# Patient Record
Sex: Female | Born: 1935
Health system: Southern US, Community
[De-identification: ages and names within clinical notes are randomized; demographics above are authoritative.]

## PROBLEM LIST (undated history)

## (undated) DIAGNOSIS — C801 Malignant (primary) neoplasm, unspecified: Secondary | ICD-10-CM

## (undated) DIAGNOSIS — E079 Disorder of thyroid, unspecified: Secondary | ICD-10-CM

## (undated) DIAGNOSIS — L309 Dermatitis, unspecified: Secondary | ICD-10-CM

## (undated) DIAGNOSIS — O009 Unspecified ectopic pregnancy without intrauterine pregnancy: Secondary | ICD-10-CM

## (undated) DIAGNOSIS — M1990 Unspecified osteoarthritis, unspecified site: Secondary | ICD-10-CM

## (undated) DIAGNOSIS — I700 Atherosclerosis of aorta: Secondary | ICD-10-CM

## (undated) DIAGNOSIS — E785 Hyperlipidemia, unspecified: Secondary | ICD-10-CM

## (undated) DIAGNOSIS — I4819 Other persistent atrial fibrillation: Secondary | ICD-10-CM

## (undated) DIAGNOSIS — R51 Headache: Secondary | ICD-10-CM

## (undated) DIAGNOSIS — I10 Essential (primary) hypertension: Secondary | ICD-10-CM

## (undated) DIAGNOSIS — E039 Hypothyroidism, unspecified: Secondary | ICD-10-CM

## (undated) DIAGNOSIS — Z87442 Personal history of urinary calculi: Secondary | ICD-10-CM

## (undated) HISTORY — DX: Other persistent atrial fibrillation: I48.19

## (undated) HISTORY — PX: JOINT REPLACEMENT: SHX530

## (undated) HISTORY — PX: WISDOM TOOTH EXTRACTION: SHX21

## (undated) HISTORY — PX: OTHER SURGICAL HISTORY: SHX169

## (undated) HISTORY — PX: ECTOPIC PREGNANCY SURGERY: SHX613

## (undated) HISTORY — PX: HERNIA REPAIR: SHX51

## (undated) HISTORY — PX: TONSILLECTOMY: SUR1361

## (undated) HISTORY — PX: LAPAROTOMY: SHX154

## (undated) HISTORY — DX: Unspecified ectopic pregnancy without intrauterine pregnancy: O00.90

## (undated) HISTORY — DX: Atherosclerosis of aorta: I70.0

---

## 1998-07-22 ENCOUNTER — Other Ambulatory Visit: Admission: RE | Admit: 1998-07-22 | Discharge: 1998-07-22 | Payer: Self-pay

## 2001-02-21 ENCOUNTER — Other Ambulatory Visit: Admission: RE | Admit: 2001-02-21 | Discharge: 2001-02-21 | Payer: Self-pay | Admitting: Family Medicine

## 2005-02-04 ENCOUNTER — Encounter: Admission: RE | Admit: 2005-02-04 | Discharge: 2005-02-04 | Payer: Self-pay | Admitting: Neurosurgery

## 2005-02-18 ENCOUNTER — Encounter: Admission: RE | Admit: 2005-02-18 | Discharge: 2005-02-18 | Payer: Self-pay | Admitting: Neurosurgery

## 2005-03-15 ENCOUNTER — Encounter: Admission: RE | Admit: 2005-03-15 | Discharge: 2005-03-15 | Payer: Self-pay | Admitting: Neurosurgery

## 2005-12-14 ENCOUNTER — Encounter: Admission: RE | Admit: 2005-12-14 | Discharge: 2005-12-14 | Payer: Self-pay | Admitting: Neurosurgery

## 2005-12-23 ENCOUNTER — Inpatient Hospital Stay (HOSPITAL_COMMUNITY): Admission: RE | Admit: 2005-12-23 | Discharge: 2005-12-24 | Payer: Self-pay | Admitting: Neurosurgery

## 2006-01-17 ENCOUNTER — Encounter: Admission: RE | Admit: 2006-01-17 | Discharge: 2006-02-07 | Payer: Self-pay | Admitting: Neurosurgery

## 2006-05-04 ENCOUNTER — Other Ambulatory Visit: Admission: RE | Admit: 2006-05-04 | Discharge: 2006-05-04 | Payer: Self-pay | Admitting: Family Medicine

## 2007-03-02 ENCOUNTER — Inpatient Hospital Stay (HOSPITAL_COMMUNITY): Admission: RE | Admit: 2007-03-02 | Discharge: 2007-03-06 | Payer: Self-pay | Admitting: Orthopedic Surgery

## 2007-03-03 ENCOUNTER — Ambulatory Visit: Payer: Self-pay | Admitting: Physical Medicine & Rehabilitation

## 2008-12-04 LAB — HM DEXA SCAN

## 2009-02-27 LAB — HM COLONOSCOPY: HM Colonoscopy: NORMAL

## 2010-01-26 LAB — HM MAMMOGRAPHY: HM Mammogram: NORMAL

## 2011-03-18 ENCOUNTER — Encounter: Payer: Self-pay | Admitting: Nurse Practitioner

## 2011-03-18 DIAGNOSIS — E039 Hypothyroidism, unspecified: Secondary | ICD-10-CM

## 2011-03-18 DIAGNOSIS — E1159 Type 2 diabetes mellitus with other circulatory complications: Secondary | ICD-10-CM | POA: Insufficient documentation

## 2011-03-18 DIAGNOSIS — M1990 Unspecified osteoarthritis, unspecified site: Secondary | ICD-10-CM

## 2011-03-18 DIAGNOSIS — E785 Hyperlipidemia, unspecified: Secondary | ICD-10-CM

## 2011-03-18 DIAGNOSIS — I152 Hypertension secondary to endocrine disorders: Secondary | ICD-10-CM | POA: Insufficient documentation

## 2011-03-18 DIAGNOSIS — E1169 Type 2 diabetes mellitus with other specified complication: Secondary | ICD-10-CM | POA: Insufficient documentation

## 2011-03-18 DIAGNOSIS — M8580 Other specified disorders of bone density and structure, unspecified site: Secondary | ICD-10-CM

## 2011-03-18 DIAGNOSIS — I10 Essential (primary) hypertension: Secondary | ICD-10-CM

## 2011-03-18 DIAGNOSIS — E1140 Type 2 diabetes mellitus with diabetic neuropathy, unspecified: Secondary | ICD-10-CM

## 2011-11-30 DIAGNOSIS — I700 Atherosclerosis of aorta: Secondary | ICD-10-CM | POA: Insufficient documentation

## 2011-11-30 HISTORY — DX: Atherosclerosis of aorta: I70.0

## 2011-12-13 DIAGNOSIS — E785 Hyperlipidemia, unspecified: Secondary | ICD-10-CM | POA: Diagnosis not present

## 2011-12-13 DIAGNOSIS — I10 Essential (primary) hypertension: Secondary | ICD-10-CM | POA: Diagnosis not present

## 2011-12-13 DIAGNOSIS — E119 Type 2 diabetes mellitus without complications: Secondary | ICD-10-CM | POA: Diagnosis not present

## 2011-12-13 DIAGNOSIS — E559 Vitamin D deficiency, unspecified: Secondary | ICD-10-CM | POA: Diagnosis not present

## 2011-12-13 DIAGNOSIS — E039 Hypothyroidism, unspecified: Secondary | ICD-10-CM | POA: Diagnosis not present

## 2012-02-07 ENCOUNTER — Emergency Department (HOSPITAL_COMMUNITY)
Admission: EM | Admit: 2012-02-07 | Discharge: 2012-02-07 | Disposition: A | Discharge status: Home / Self Care | Payer: Medicare Other | Attending: Emergency Medicine | Admitting: Emergency Medicine

## 2012-02-07 ENCOUNTER — Encounter (HOSPITAL_COMMUNITY): Payer: Self-pay

## 2012-02-07 ENCOUNTER — Other Ambulatory Visit: Payer: Self-pay

## 2012-02-07 ENCOUNTER — Emergency Department (HOSPITAL_COMMUNITY): Payer: Medicare Other

## 2012-02-07 DIAGNOSIS — N269 Renal sclerosis, unspecified: Secondary | ICD-10-CM | POA: Diagnosis not present

## 2012-02-07 DIAGNOSIS — I10 Essential (primary) hypertension: Secondary | ICD-10-CM | POA: Insufficient documentation

## 2012-02-07 DIAGNOSIS — R109 Unspecified abdominal pain: Secondary | ICD-10-CM | POA: Diagnosis not present

## 2012-02-07 DIAGNOSIS — R112 Nausea with vomiting, unspecified: Secondary | ICD-10-CM | POA: Diagnosis not present

## 2012-02-07 DIAGNOSIS — E119 Type 2 diabetes mellitus without complications: Secondary | ICD-10-CM | POA: Diagnosis not present

## 2012-02-07 DIAGNOSIS — N200 Calculus of kidney: Secondary | ICD-10-CM | POA: Diagnosis not present

## 2012-02-07 DIAGNOSIS — R5381 Other malaise: Secondary | ICD-10-CM | POA: Diagnosis not present

## 2012-02-07 DIAGNOSIS — R10816 Epigastric abdominal tenderness: Secondary | ICD-10-CM | POA: Diagnosis not present

## 2012-02-07 DIAGNOSIS — R1013 Epigastric pain: Secondary | ICD-10-CM | POA: Diagnosis not present

## 2012-02-07 DIAGNOSIS — R142 Eructation: Secondary | ICD-10-CM | POA: Insufficient documentation

## 2012-02-07 DIAGNOSIS — R143 Flatulence: Secondary | ICD-10-CM | POA: Insufficient documentation

## 2012-02-07 DIAGNOSIS — K429 Umbilical hernia without obstruction or gangrene: Secondary | ICD-10-CM | POA: Diagnosis not present

## 2012-02-07 DIAGNOSIS — R110 Nausea: Secondary | ICD-10-CM | POA: Diagnosis not present

## 2012-02-07 DIAGNOSIS — Z79899 Other long term (current) drug therapy: Secondary | ICD-10-CM | POA: Diagnosis not present

## 2012-02-07 DIAGNOSIS — R141 Gas pain: Secondary | ICD-10-CM | POA: Diagnosis not present

## 2012-02-07 DIAGNOSIS — R6889 Other general symptoms and signs: Secondary | ICD-10-CM | POA: Diagnosis not present

## 2012-02-07 DIAGNOSIS — R1031 Right lower quadrant pain: Secondary | ICD-10-CM | POA: Diagnosis not present

## 2012-02-07 HISTORY — DX: Essential (primary) hypertension: I10

## 2012-02-07 HISTORY — DX: Disorder of thyroid, unspecified: E07.9

## 2012-02-07 LAB — TROPONIN I
Troponin I: <0.3 ng/mL (ref <0.30)
Troponin I: <0.3 ng/mL (ref <0.30)

## 2012-02-07 LAB — URINALYSIS, ROUTINE W REFLEX MICROSCOPIC
Leukocytes, UA: NEGATIVE
Nitrite: NEGATIVE
Specific Gravity, Urine: 1.01 (ref 1.005–1.030)
Urobilinogen, UA: 0.2 mg/dL (ref 0.0–1.0)
pH: 8.5 — ABNORMAL HIGH (ref 5.0–8.0)

## 2012-02-07 LAB — LACTIC ACID, PLASMA: Lactic Acid, Venous: 2.4 mmol/L — ABNORMAL HIGH (ref 0.5–2.2)

## 2012-02-07 LAB — COMPREHENSIVE METABOLIC PANEL
Albumin: 3.7 g/dL (ref 3.5–5.2)
Alkaline Phosphatase: 44 U/L (ref 39–117)
BUN: 17 mg/dL (ref 6–23)
Calcium: 8.5 mg/dL (ref 8.4–10.5)
Creatinine, Ser: 0.91 mg/dL (ref 0.50–1.10)
GFR calc Af Amer: 69 mL/min — ABNORMAL LOW (ref >90)
Glucose, Bld: 209 mg/dL — ABNORMAL HIGH (ref 70–99)
Potassium: 3.9 mEq/L (ref 3.5–5.1)
Total Protein: 7 g/dL (ref 6.0–8.3)

## 2012-02-07 LAB — DIFFERENTIAL
Basophils Absolute: 0 10*3/uL (ref 0.0–0.1)
Basophils Relative: 0 % (ref 0–1)
Lymphocytes Relative: 4 % — ABNORMAL LOW (ref 12–46)
Monocytes Absolute: 0.4 10*3/uL (ref 0.1–1.0)
Monocytes Relative: 4 % (ref 3–12)
Neutro Abs: 7.8 10*3/uL — ABNORMAL HIGH (ref 1.7–7.7)
Neutrophils Relative %: 91 % — ABNORMAL HIGH (ref 43–77)

## 2012-02-07 LAB — CBC
HCT: 42.3 % (ref 36.0–46.0)
MCH: 30.6 pg (ref 26.0–34.0)
Platelets: 146 10*3/uL — ABNORMAL LOW (ref 150–400)
WBC: 8.5 10*3/uL (ref 4.0–10.5)

## 2012-02-07 LAB — LIPASE, BLOOD: Lipase: 94 U/L — ABNORMAL HIGH (ref 11–59)

## 2012-02-07 MED ORDER — ONDANSETRON HCL 4 MG/2ML IJ SOLN
4.0000 mg | Freq: Once | INTRAMUSCULAR | Status: AC
  Administered 2012-02-07: 4 mg via INTRAVENOUS

## 2012-02-07 MED ORDER — ONDANSETRON HCL 4 MG PO TABS: 4.0000 mg | ORAL_TABLET | Freq: Four times a day (QID) | ORAL | Status: AC

## 2012-02-07 MED ORDER — ONDANSETRON HCL 4 MG/2ML IJ SOLN
INTRAMUSCULAR | Status: AC
  Filled 2012-02-07: qty 2

## 2012-02-07 MED ORDER — ONDANSETRON HCL 4 MG/2ML IJ SOLN
4.0000 mg | Freq: Once | INTRAMUSCULAR | Status: AC
  Administered 2012-02-07: 4 mg via INTRAVENOUS
  Filled 2012-02-07: qty 2

## 2012-02-07 MED ORDER — SODIUM CHLORIDE 0.9 % IV BOLUS (SEPSIS)
1000.0000 mL | Freq: Once | INTRAVENOUS | Status: AC
  Administered 2012-02-07: 1000 mL via INTRAVENOUS

## 2012-02-07 NOTE — ED Notes (Signed)
Per EMS- Patient states that she began having upper abd pain with nausea that began at 0300 today. Patient denies vomiting , fever, or diarrhea. Zofran ODT given per EMS

## 2012-02-07 NOTE — ED Provider Notes (Signed)
History     CSN: [id number]  Arrival date & time 02/07/12  4098   First MD Initiated Contact with Patient 02/07/12 [phone]      Chief Complaint  Patient presents with  . Abdominal Pain  . Nausea    (Consider location/radiation/quality/duration/timing/severity/associated sxs/prior treatment) HPI Comments: Patient presents with nausea, upper abdominal pain it started around 3 AM. Has been constant the pain does not radiate. She feels abdominal bloating and sensation that she needs to vomit but cannot. She had one episode of loose stools this morning. She denies any chest pain, shortness of breath, cough or fever. She denies any urinary or vaginal symptoms. She has a history of diabetes and hypertension. She's not had this in the past. She is given Zofran by EMS with minimal relief. The pain is in her epigastric and left upper quadrant area but spreads to her entire abdomen.  The history is provided by the patient.    Past Medical History  Diagnosis Date  . Kidney stones   . Diabetes mellitus   . Thyroid disease   . Hypertension     Past Surgical History  Procedure Date  . Lt knee arthroscopy   . Rt total hip replacement   . Ectopic pregnancy surgery     Family History  Problem Relation Age of Onset  . Heart disease Father   . Stroke Father   . Diabetes Father   . Breast cancer Daughter     History  Substance Use Topics  . Smoking status: Never Smoker   . Smokeless tobacco: Never Used  . Alcohol Use: No    OB History    Grav Para Term Preterm Abortions TAB SAB Ect Mult Living                  Review of Systems  Constitutional: Positive for chills, activity change and appetite change. Negative for fever.  HENT: Negative for congestion and rhinorrhea.   Eyes: Negative for visual disturbance.  Respiratory: Negative for cough, chest tightness and shortness of breath.   Cardiovascular: Negative for chest pain.  Gastrointestinal: Positive for nausea, abdominal pain  and diarrhea. Negative for vomiting and constipation.  Genitourinary: Negative for dysuria, hematuria, vaginal bleeding and vaginal discharge.  Musculoskeletal: Negative for back pain.  Skin: Negative for rash.  Neurological: Negative for dizziness, light-headedness and headaches.    Allergies  Ace inhibitors; Iohexol; Statins; Iodine; Penicillins; and Sulfa antibiotics  Home Medications   Current Outpatient Rx  Name Route Sig Dispense Refill  . ASPIRIN 81 MG PO TABS Oral Take 81 mg by mouth daily.      [name] DICLOFENAC SODIUM 75 MG PO TBEC Oral Take 75 mg by mouth 2 (two) times daily.    [name] GABAPENTIN 100 MG PO CAPS Oral Take 100 mg by mouth at bedtime.    [name] HYDROCHLOROTHIAZIDE 25 MG PO TABS Oral Take 25 mg by mouth daily.      [name] LEVOTHYROXINE SODIUM 175 MCG PO TABS Oral Take 175 mcg by mouth daily.      [name] METFORMIN HCL 500 MG PO TABS Oral Take 500 mg by mouth 2 (two) times daily with a meal.      . ONDANSETRON HCL 4 MG PO TABS Oral Take 1 tablet (4 mg total) by mouth every 6 (six) hours. 12 tablet 0    BP 151/69  Pulse 92  Temp(Src) 98.8 F (37.1 C) (Oral)  Resp 16  Ht 5\' 4"  (1.626 m)  Wt 200  lb (90.719 kg)  BMI 34.33 kg/m2  SpO2 95%  Physical Exam  Constitutional: She is oriented to person, place, and time. She appears well-developed and well-nourished. No distress.  HENT:  Head: Normocephalic and atraumatic.  Mouth/Throat: Oropharynx is clear and moist. No oropharyngeal exudate.  Eyes: Conjunctivae are normal. Pupils are equal, round, and reactive to light.  Neck: Normal range of motion. Neck supple.  Cardiovascular: Normal rate, regular rhythm and normal heart sounds.   Pulmonary/Chest: Effort normal and breath sounds normal. No respiratory distress. She exhibits no tenderness.  Abdominal: Soft. There is tenderness.       Epigastric tenderness, abdominal distention, easily reducible umbilical hernia  Musculoskeletal: Normal range of motion. She exhibits no edema and no  tenderness.  Neurological: She is alert and oriented to person, place, and time. No cranial nerve deficit.  Skin: Skin is warm.    ED Course  Procedures (including critical care time)  Labs Reviewed  CBC - Abnormal; Notable for the following:    Platelets 146 (*)    All other components within normal limits  DIFFERENTIAL - Abnormal; Notable for the following:    Neutrophils Relative 91 (*)    Neutro Abs 7.8 (*)    Lymphocytes Relative 4 (*)    Lymphs Abs 0.3 (*)    All other components within normal limits  COMPREHENSIVE METABOLIC PANEL - Abnormal; Notable for the following:    Glucose, Bld 209 (*)    GFR calc non Af Amer 59 (*)    GFR calc Af Amer 69 (*)    All other components within normal limits  LIPASE, BLOOD - Abnormal; Notable for the following:    Lipase 94 (*)    All other components within normal limits  URINALYSIS, ROUTINE W REFLEX MICROSCOPIC - Abnormal; Notable for the following:    APPearance HAZY (*)    pH 8.5 (*)    Glucose, UA 100 (*)    Ketones, ur 15 (*)    All other components within normal limits  LACTIC ACID, PLASMA - Abnormal; Notable for the following:    Lactic Acid, Venous 2.4 (*)    All other components within normal limits  TROPONIN I  TROPONIN I   Ct Abdomen Pelvis Wo Contrast  02/07/2012  *RADIOLOGY REPORT*  Clinical Data: Epigastric pain, nausea  CT ABDOMEN AND PELVIS WITHOUT CONTRAST  Technique:  Multidetector CT imaging of the abdomen and pelvis was performed following the standard protocol without intravenous contrast.  Comparison: None.  Findings: Lung bases are essentially clear.  Liver is notable for possible hepatic steatosis.  Spleen, pancreas, and adrenal glands are within normal limits. Specifically, there are no peripancreatic inflammatory changes.  Gallbladder is unremarkable.  No intrahepatic or extrahepatic ductal dilatation.  Two left and two right nonobstructing renal calculi, measuring up to 5 mm in the right lower pole (series  2/image 27).  No hydronephrosis.  No evidence of bowel obstruction.  Normal appendix.  Colonic diverticulosis, without associated inflammatory changes.  Atherosclerotic calcifications of the abdominal aorta and branch vessels.  No abdominopelvic ascites.  No suspicious abdominopelvic lymphadenopathy.  Uterus and bilateral ovaries are unremarkable.  No ureteral or bladder calculi.  Small fat-containing periumbilical hernia (series 2/image 55).  Streak artifact from right total hip arthroplasty.  No evidence of hardware complication.  Degenerative changes with scoliosis of the visualized thoracolumbar spine.  IMPRESSION: No evidence of bowel obstruction.  Normal appendix.  Small fat-containing periumbilical hernia.  Nonobstructing bilateral renal calculi measuring up to  5 mm in the right upper pole.  No hydronephrosis.  Possible hepatic steatosis.  Original Report Authenticated By: Charline Bills, M.D.   Dg Chest 2 View  02/07/2012  *RADIOLOGY REPORT*  Clinical Data: Epigastric pain, nausea  CHEST - 2 VIEW  Comparison: 02/24/2007  Findings: Lungs are essentially clear.  Apparent mild patchy opacity at the left lung apex, likely osseous (related to the left first rib).  No pleural effusion or pneumothorax.  Cardiomediastinal silhouette is within normal limits.  Degenerative changes of the visualized thoracolumbar spine.  IMPRESSION: No evidence of acute cardiopulmonary disease.  Original Report Authenticated By: Charline Bills, M.D.   US Abdomen Complete  02/07/2012  *RADIOLOGY REPORT*  Clinical Data:  Right lower quadrant abdominal pain.  COMPLETE ABDOMINAL ULTRASOUND  Comparison:  CT dated 02/07/2012.  Findings:  Gallbladder:  No gallstones, gallbladder wall thickening, or pericholecystic fluid.  Common bile duct:  Not dilated, measured 4.5 mm in diameter proximally.  Liver:  Diffusely echogenic, with corresponding possible steatosis noted on the previous CT.  IVC:  Appears normal.  Pancreas:  Poorly  visualized head and tail.  The visualized portions appear normal.  Spleen:  Normal, measuring 10.0 cm in length.  Right Kidney:  7 mm upper pole calculus.  Diffuse parenchymal thinning with prominent renal sinus fat.  Borderline increased in echogenicity.  11.9 cm in length.  No hydronephrosis.  Left Kidney:  Borderline increased in echogenicity.  Diffuse parenchymal thinning with prominent renal sinus fat.  12.7 cm in length.  No hydronephrosis.  Abdominal aorta:  Normal in caliber proximally.  The mid and distal aorta obscured by overlying bowel gas.  IMPRESSION:  1.  Limited examination, as described above. 2.  Normal appearing gallbladder. 3.  Diffusely echogenic liver, compatible with steatosis. 4.  Diffuse bilateral renal parenchymal atrophy and borderline increased echogenicity, suggesting mild chronic medical renal disease.  Original Report Authenticated By: Darrol Angel, M.D.     1. Abdominal pain       MDM  Abdominal pain with nausea. Abdomen distended but not peritoneal.  We'll obtain labs, CT imaging for IV hydration and symptom control EKG and troponin given the remote possibility of atypical ACS.  Lipase mildly elevated at 93 but no evidence of pancreatitis on CT. Nausea has improved the patient is tolerating by mouth liquids. EKG is nonischemic, troponin is negative x2 Ultrasound does not show any gallstones.  Patient tolerating by mouth stable for discharge.  Date: 02/07/2012  Rate: 84  Rhythm: sinus arrhythmia with PVCs  QRS Axis: normal  Intervals: normal  ST/T Wave abnormalities: nonspecific ST/T changes  Conduction Disutrbances:none  Narrative Interpretation:   Old EKG Reviewed: unchanged        Glynn Octave, MD 02/07/12 [facility]

## 2012-03-15 DIAGNOSIS — E039 Hypothyroidism, unspecified: Secondary | ICD-10-CM | POA: Diagnosis not present

## 2012-03-15 DIAGNOSIS — E559 Vitamin D deficiency, unspecified: Secondary | ICD-10-CM | POA: Diagnosis not present

## 2012-03-15 DIAGNOSIS — I10 Essential (primary) hypertension: Secondary | ICD-10-CM | POA: Diagnosis not present

## 2012-03-15 DIAGNOSIS — E785 Hyperlipidemia, unspecified: Secondary | ICD-10-CM | POA: Diagnosis not present

## 2012-06-20 DIAGNOSIS — R072 Precordial pain: Secondary | ICD-10-CM | POA: Diagnosis not present

## 2012-06-20 DIAGNOSIS — E559 Vitamin D deficiency, unspecified: Secondary | ICD-10-CM | POA: Diagnosis not present

## 2012-06-20 DIAGNOSIS — E039 Hypothyroidism, unspecified: Secondary | ICD-10-CM | POA: Diagnosis not present

## 2012-06-20 DIAGNOSIS — Z1231 Encounter for screening mammogram for malignant neoplasm of breast: Secondary | ICD-10-CM | POA: Diagnosis not present

## 2012-06-20 DIAGNOSIS — I10 Essential (primary) hypertension: Secondary | ICD-10-CM | POA: Diagnosis not present

## 2012-06-20 DIAGNOSIS — E785 Hyperlipidemia, unspecified: Secondary | ICD-10-CM | POA: Diagnosis not present

## 2012-09-18 DIAGNOSIS — Z23 Encounter for immunization: Secondary | ICD-10-CM | POA: Diagnosis not present

## 2012-09-18 DIAGNOSIS — E559 Vitamin D deficiency, unspecified: Secondary | ICD-10-CM | POA: Diagnosis not present

## 2012-09-18 DIAGNOSIS — E039 Hypothyroidism, unspecified: Secondary | ICD-10-CM | POA: Diagnosis not present

## 2012-09-18 DIAGNOSIS — E119 Type 2 diabetes mellitus without complications: Secondary | ICD-10-CM | POA: Diagnosis not present

## 2012-09-18 DIAGNOSIS — E785 Hyperlipidemia, unspecified: Secondary | ICD-10-CM | POA: Diagnosis not present

## 2012-09-18 DIAGNOSIS — I10 Essential (primary) hypertension: Secondary | ICD-10-CM | POA: Diagnosis not present

## 2012-09-18 DIAGNOSIS — R5381 Other malaise: Secondary | ICD-10-CM | POA: Diagnosis not present

## 2012-09-18 DIAGNOSIS — R5383 Other fatigue: Secondary | ICD-10-CM | POA: Diagnosis not present

## 2012-12-18 DIAGNOSIS — E039 Hypothyroidism, unspecified: Secondary | ICD-10-CM | POA: Diagnosis not present

## 2012-12-18 DIAGNOSIS — I10 Essential (primary) hypertension: Secondary | ICD-10-CM | POA: Diagnosis not present

## 2012-12-18 DIAGNOSIS — E785 Hyperlipidemia, unspecified: Secondary | ICD-10-CM | POA: Diagnosis not present

## 2012-12-18 DIAGNOSIS — E119 Type 2 diabetes mellitus without complications: Secondary | ICD-10-CM | POA: Diagnosis not present

## 2012-12-18 DIAGNOSIS — R5383 Other fatigue: Secondary | ICD-10-CM | POA: Diagnosis not present

## 2013-01-01 DIAGNOSIS — N950 Postmenopausal bleeding: Secondary | ICD-10-CM | POA: Diagnosis not present

## 2013-01-09 ENCOUNTER — Other Ambulatory Visit: Payer: Self-pay | Admitting: Obstetrics & Gynecology

## 2013-01-09 DIAGNOSIS — R9389 Abnormal findings on diagnostic imaging of other specified body structures: Secondary | ICD-10-CM | POA: Diagnosis not present

## 2013-01-09 DIAGNOSIS — N950 Postmenopausal bleeding: Secondary | ICD-10-CM

## 2013-01-10 ENCOUNTER — Encounter: Payer: Self-pay | Admitting: *Deleted

## 2013-01-16 ENCOUNTER — Ambulatory Visit (HOSPITAL_COMMUNITY)
Admission: RE | Admit: 2013-01-16 | Discharge: 2013-01-16 | Disposition: A | Discharge status: Home / Self Care | Payer: Medicare Other | Source: Ambulatory Visit | Attending: Obstetrics & Gynecology | Admitting: Obstetrics & Gynecology

## 2013-01-16 DIAGNOSIS — N950 Postmenopausal bleeding: Secondary | ICD-10-CM | POA: Insufficient documentation

## 2013-01-16 DIAGNOSIS — R9389 Abnormal findings on diagnostic imaging of other specified body structures: Secondary | ICD-10-CM | POA: Insufficient documentation

## 2013-01-16 NOTE — Progress Notes (Signed)
Informed of ultrasound finding suspicious for endometrial cancer in this postmenopausal patient experiencing postmenopausal bleeding. Patient contacted and message left on voice mail reminding her of appointment at [facility] clinic next Tuesday 2/25 at 1:30pm to review ultrasound results.

## 2013-01-16 NOTE — Progress Notes (Signed)
Debra Campbell, radiologist, called and informed me that the pt just left from an [name] and she believes that the pt has endometrial carcinoma.  I called on call provider, Dr. Jolayne Panther, and informed her and that pt also has a f/u appt scheduled with Dr.Arnold on 01/23/13.  Dr. Jolayne Panther no further instruction.

## 2013-01-23 ENCOUNTER — Ambulatory Visit (INDEPENDENT_AMBULATORY_CARE_PROVIDER_SITE_OTHER): Payer: Medicare Other | Admitting: Obstetrics & Gynecology

## 2013-01-23 DIAGNOSIS — N951 Menopausal and female climacteric states: Secondary | ICD-10-CM | POA: Diagnosis not present

## 2013-02-06 ENCOUNTER — Ambulatory Visit: Payer: Medicare Other | Admitting: Obstetrics & Gynecology

## 2013-02-13 ENCOUNTER — Ambulatory Visit: Payer: Medicare Other | Admitting: Obstetrics & Gynecology

## 2013-02-20 ENCOUNTER — Encounter: Payer: Self-pay | Admitting: *Deleted

## 2013-02-27 ENCOUNTER — Encounter: Payer: Self-pay | Admitting: *Deleted

## 2013-02-28 ENCOUNTER — Telehealth: Payer: Self-pay | Admitting: General Practice

## 2013-02-28 NOTE — Telephone Encounter (Signed)
Called patient and asked if I could schedule her a follow up appt since she missed a couple and the patient stated that was fine. Got appt for 4/15 @ 1:15 at the [place] office. Confirmed appt with patient- patient verbalized understanding and had no further questions

## 2013-02-28 NOTE — Telephone Encounter (Signed)
Message copied by Kathee Delton on Wed Feb 28, 2013  1:22 PM ------      Message from: Adam Phenix      Created: Mon Feb 26, 2013 10:20 PM       Needs f/u for Bx per Dr. Deretha Emory note ------

## 2013-03-13 ENCOUNTER — Encounter: Payer: Self-pay | Admitting: Obstetrics & Gynecology

## 2013-03-13 ENCOUNTER — Ambulatory Visit (INDEPENDENT_AMBULATORY_CARE_PROVIDER_SITE_OTHER): Payer: Medicare Other | Admitting: Obstetrics & Gynecology

## 2013-03-13 VITALS — BP 136/73 | HR 96 | Temp 98.0°F | Resp 16 | Ht 64.0 in | Wt 211.0 lb

## 2013-03-13 DIAGNOSIS — N950 Postmenopausal bleeding: Secondary | ICD-10-CM | POA: Diagnosis not present

## 2013-03-13 DIAGNOSIS — N882 Stricture and stenosis of cervix uteri: Secondary | ICD-10-CM | POA: Diagnosis not present

## 2013-03-13 MED ORDER — MISOPROSTOL 200 MCG PO TABS: ORAL_TABLET | ORAL | Status: DC

## 2013-03-13 NOTE — Progress Notes (Signed)
Patient ID: Debra Campbell, female   DOB: [birth year], 79 y.o.   MRN: [id number] Patient comes for endometrial biopsy after episode of vaginal bleeding, none since last visit. US showed abnormal endometrial thickening. She consents to EMBx after procedure and risks were explained.  Cervix prepped with Betadine, grasped with tenaculum. Os is stenotic, unable to obtain specimen.    RTC for dilation and Bx.  Cytotec 200 mg PV hs before procedure.   Adam Phenix, MD 03/13/2013

## 2013-03-13 NOTE — Patient Instructions (Signed)
Endometrial Biopsy This is a test in which a tissue sample (a biopsy) is taken from inside the uterus (womb). It is then looked at by a specialist under a microscope to see if the tissue is normal or abnormal. The endometrium is the lining of the uterus. This test helps determine where you are in your menstrual cycle and how hormone levels are affecting the lining of the uterus. Another use for this test is to diagnose endometrial cancer, tuberculosis, polyps, or inflammatory conditions and to evaluate uterine bleeding. PREPARATION FOR TEST No preparation or fasting is necessary. NORMAL FINDINGS No pathologic conditions. Presence of "secretory-type" endometrium 3 to 5 days before to normal menstruation. Ranges for normal findings may vary among different laboratories and hospitals. You should always check with your doctor after having lab work or other tests done to discuss the meaning of your test results and whether your values are considered within normal limits. MEANING OF TEST  Your caregiver will go over the test results with you and discuss the importance and meaning of your results, as well as treatment options and the need for additional tests if necessary. OBTAINING THE TEST RESULTS It is your responsibility to obtain your test results. Ask the lab or department performing the test when and how you will get your results. Document Released: 03/18/2005 Document Revised: 02/07/2012 Document Reviewed: 10/25/2008 [employer] Patient Information 2013 [employer].  

## 2013-03-14 ENCOUNTER — Other Ambulatory Visit: Payer: Self-pay | Admitting: Nurse Practitioner

## 2013-03-15 ENCOUNTER — Other Ambulatory Visit: Payer: Self-pay | Admitting: Nurse Practitioner

## 2013-03-19 ENCOUNTER — Encounter: Payer: Self-pay | Admitting: Nurse Practitioner

## 2013-03-19 ENCOUNTER — Ambulatory Visit (INDEPENDENT_AMBULATORY_CARE_PROVIDER_SITE_OTHER): Payer: Medicare Other | Admitting: Nurse Practitioner

## 2013-03-19 VITALS — BP 117/70 | HR 72 | Temp 97.5°F | Ht 64.0 in | Wt 209.0 lb

## 2013-03-19 DIAGNOSIS — I10 Essential (primary) hypertension: Secondary | ICD-10-CM | POA: Diagnosis not present

## 2013-03-19 DIAGNOSIS — E785 Hyperlipidemia, unspecified: Secondary | ICD-10-CM | POA: Diagnosis not present

## 2013-03-19 DIAGNOSIS — M25569 Pain in unspecified knee: Secondary | ICD-10-CM

## 2013-03-19 DIAGNOSIS — E119 Type 2 diabetes mellitus without complications: Secondary | ICD-10-CM

## 2013-03-19 DIAGNOSIS — E039 Hypothyroidism, unspecified: Secondary | ICD-10-CM | POA: Diagnosis not present

## 2013-03-19 LAB — COMPLETE METABOLIC PANEL WITH GFR
AST: 26 U/L (ref 0–37)
Albumin: 4 g/dL (ref 3.5–5.2)
Alkaline Phosphatase: 37 U/L — ABNORMAL LOW (ref 39–117)
BUN: 19 mg/dL (ref 6–23)
Creat: 1.04 mg/dL (ref 0.50–1.10)
GFR, Est Non African American: 52 mL/min — ABNORMAL LOW
Glucose, Bld: 151 mg/dL — ABNORMAL HIGH (ref 70–99)
Potassium: 4.5 mEq/L (ref 3.5–5.3)

## 2013-03-19 MED ORDER — GABAPENTIN 100 MG PO CAPS: 100.0000 mg | ORAL_CAPSULE | Freq: Every day | ORAL | Status: DC

## 2013-03-19 MED ORDER — TRIAMCINOLONE 0.1 % CREAM:EUCERIN CREAM 1:1: 1.0000 "application " | TOPICAL_CREAM | Freq: Two times a day (BID) | CUTANEOUS | Status: DC

## 2013-03-19 MED ORDER — GLUCOSE BLOOD VI STRP: ORAL_STRIP | Status: DC

## 2013-03-19 MED ORDER — DICLOFENAC SODIUM 75 MG PO TBEC: 75.0000 mg | DELAYED_RELEASE_TABLET | Freq: Two times a day (BID) | ORAL | Status: DC

## 2013-03-19 MED ORDER — LEVOTHYROXINE SODIUM 175 MCG PO TABS: 175.0000 ug | ORAL_TABLET | Freq: Every day | ORAL | Status: DC

## 2013-03-19 MED ORDER — METFORMIN HCL 500 MG PO TABS: 500.0000 mg | ORAL_TABLET | Freq: Two times a day (BID) | ORAL | Status: DC

## 2013-03-19 MED ORDER — HYDROCHLOROTHIAZIDE 25 MG PO TABS: 25.0000 mg | ORAL_TABLET | Freq: Every day | ORAL | Status: DC

## 2013-03-19 MED ORDER — ONETOUCH ULTRASOFT LANCETS MISC: Status: DC

## 2013-03-19 NOTE — Patient Instructions (Signed)

## 2013-03-19 NOTE — Progress Notes (Signed)
Subjective:    Patient ID: Debra Campbell, female    DOB: [birth year], 79 y.o.   MRN: [id number]  Hypertension This is a chronic problem. The current episode started more than 1 year ago. The problem is unchanged. The problem is controlled. Pertinent negatives include no blurred vision, chest pain, headaches, peripheral edema or shortness of breath. There are no associated agents to hypertension. Risk factors for coronary artery disease include diabetes mellitus, dyslipidemia and post-menopausal state. Past treatments include diuretics. The current treatment provides significant improvement. Compliance problems include diet and exercise.   Hyperlipidemia This is a chronic problem. The current episode started more than 1 year ago. The problem is controlled. Recent lipid tests were reviewed and are variable. Exacerbating diseases include diabetes and obesity. Pertinent negatives include no chest pain, leg pain, myalgias or shortness of breath. She is currently on no antihyperlipidemic treatment (Refuses statin- Makes her hurt). The current treatment provides no improvement of lipids. Compliance problems include adherence to exercise and adherence to diet.  Risk factors for coronary artery disease include post-menopausal and diabetes mellitus.  Diabetes She has type 2 diabetes mellitus. No MedicAlert identification noted. The initial diagnosis of diabetes was made 10 years ago. Her disease course has been stable. There are no hypoglycemic associated symptoms. Pertinent negatives for hypoglycemia include no headaches. Pertinent negatives for diabetes include no blurred vision, no chest pain, no foot paresthesias, no foot ulcerations, no polydipsia, no polyphagia, no polyuria, no visual change, no weakness and no weight loss. There are no hypoglycemic complications. Symptoms are stable. Risk factors for coronary artery disease include dyslipidemia, obesity and post-menopausal. Current diabetic treatment includes  oral agent (monotherapy). She is compliant with treatment all of the time. Her weight is stable. She is following a generally healthy diet. When asked about meal planning, she reported none. She has not had a previous visit with a dietician. She rarely participates in exercise. There is no change in her home blood glucose trend. Her breakfast blood glucose is taken between 7-8 am. Her breakfast blood glucose range is generally 130-140 mg/dl. Her overall blood glucose range is 130-140 mg/dl. An ACE inhibitor/angiotensin II receptor blocker is not being taken. She does not see a podiatrist.Eye exam is not current (At least 2 years).  Hypothyroidism Chronic problem. Patient taking Synthroid without complications. No c/o fatigue.     Review of Systems  Constitutional: Negative for weight loss.  Eyes: Negative for blurred vision.  Respiratory: Negative for shortness of breath.   Cardiovascular: Negative for chest pain.  Endocrine: Negative for polydipsia, polyphagia and polyuria.  Musculoskeletal: Negative for myalgias.  Neurological: Negative for weakness and headaches.  All other systems reviewed and are negative.       Objective:   Physical Exam  Constitutional: She is oriented to person, place, and time. She appears well-developed and well-nourished.  HENT:  Nose: Nose normal.  Mouth/Throat: Oropharynx is clear and moist.  Eyes: EOM are normal.  Neck: Trachea normal, normal range of motion and full passive range of motion without pain. Neck supple. No JVD present. Carotid bruit is not present. No thyromegaly present.  Cardiovascular: Normal rate, regular rhythm, normal heart sounds and intact distal pulses.  Exam reveals no gallop and no friction rub.   No murmur heard. Pulmonary/Chest: Effort normal and breath sounds normal.  Abdominal: Soft. Bowel sounds are normal. She exhibits no distension and no mass. There is no tenderness.  Musculoskeletal: Normal range of motion.   Lymphadenopathy:    She  has no cervical adenopathy.  Neurological: She is alert and oriented to person, place, and time. She has normal reflexes.  (+) 4/4 monofilament bil feet   Skin: Skin is warm and dry.  Mild callus formation bil heels  Psychiatric: She has a normal mood and affect. Her behavior is normal. Judgment and thought content normal.    BP 117/70  Pulse 72  Temp(Src) 97.5 F (36.4 C) (Oral)  Ht 5\' 4"  (1.626 m)  Wt 209 lb (94.802 kg)  BMI 35.86 kg/m2 Results for orders placed in visit on 03/19/13  POCT GLYCOSYLATED HEMOGLOBIN (HGB A1C)      Result Value Range   Hemoglobin A1C 6.7           Assessment & Plan:  1. Hypertension *Low Na+ diet** - COMPLETE METABOLIC PANEL WITH GFR - NMR Lipoprofile with Lipids  2. Diabetes mellitus Low carb diet - POCT glycosylated hemoglobin (Hb A1C) - COMPLETE METABOLIC PANEL WITH GFR - NMR Lipoprofile with Lipids  3. Hypothyroidism  - COMPLETE METABOLIC PANEL WITH GFR - NMR Lipoprofile with Lipids - Thyroid Panel With TSH  4. Hyperlipidemia Low fat diet and exercise encouraged PATIENT CANNOT TOLERATE STATIN - COMPLETE METABOLIC PANEL WITH GFR - NMR Lipoprofile with Lipids  5. Pain in joint, lower leg, unspecified laterality Continue neurotin as Rx  [name], FNP

## 2013-03-20 LAB — NMR LIPOPROFILE WITH LIPIDS
Cholesterol, Total: 230 mg/dL — ABNORMAL HIGH (ref <200)
HDL Size: 9.1 nm — ABNORMAL LOW (ref >=9.2)
LDL (calc): 148 mg/dL — ABNORMAL HIGH (ref <100)
LDL Particle Number: 1973 nmol/L — ABNORMAL HIGH (ref <1000)
LP-IR Score: 42 (ref <=45)
Large VLDL-P: 2.4 nmol/L (ref <=2.7)
Small LDL Particle Number: 1047 nmol/L — ABNORMAL HIGH (ref <=527)
Triglycerides: 149 mg/dL (ref <150)
VLDL Size: 44 nm (ref <=46.6)

## 2013-03-20 LAB — MICROALBUMIN, URINE: Microalb, Ur: 3.31 mg/dL — ABNORMAL HIGH (ref 0.00–1.89)

## 2013-03-21 ENCOUNTER — Other Ambulatory Visit: Payer: Medicare Other | Admitting: Family Medicine

## 2013-03-21 ENCOUNTER — Other Ambulatory Visit: Payer: Self-pay | Admitting: Nurse Practitioner

## 2013-03-21 MED ORDER — EZETIMIBE 10 MG PO TABS: 10.0000 mg | ORAL_TABLET | Freq: Every day | ORAL | Status: DC

## 2013-03-21 MED ORDER — LOSARTAN POTASSIUM 50 MG PO TABS: 50.0000 mg | ORAL_TABLET | Freq: Every day | ORAL | Status: DC

## 2013-03-22 ENCOUNTER — Telehealth: Payer: Self-pay | Admitting: Obstetrics & Gynecology

## 2013-03-28 ENCOUNTER — Telehealth: Payer: Self-pay | Admitting: *Deleted

## 2013-03-28 NOTE — Telephone Encounter (Signed)
Rcvd message from [facility] in the [facility] clinic that patient wanted to go over some results - I called pt at the home number and left a message for her to return my call

## 2013-04-02 ENCOUNTER — Telehealth: Payer: Self-pay | Admitting: *Deleted

## 2013-04-02 ENCOUNTER — Encounter: Payer: Self-pay | Admitting: *Deleted

## 2013-04-02 ENCOUNTER — Encounter: Payer: Medicare Other | Admitting: Obstetrics & Gynecology

## 2013-04-02 NOTE — Telephone Encounter (Signed)
Called pt to adv physician was in surgery and needed to reschedule appt - [facility] for her to rtn call to adv as to when she would like to come in.

## 2013-04-03 ENCOUNTER — Telehealth: Payer: Self-pay | Admitting: *Deleted

## 2013-04-03 NOTE — Progress Notes (Signed)
This encounter was created in error - please disregard.

## 2013-04-03 NOTE — Telephone Encounter (Signed)
I spoke to my supervisor, Berenice Bouton, and Dr. Debroah Loop about this patient needing an Endo Bx ASAP. The patient has come to the [place] clinic twice before Bx with no success - Spoke to patient to see if she would mind going to another clinic in [place], [place], or [place] and she did not want to drive that far. After speaking with Jasmine December about this patient, I called the patient to see if she would go to [facility] in [place] but she did not want to go to that one either. I adv pt that we would keep her as scheduled to come in to the [place] clinic on 04/17/13 for Biopsy. She agreed.

## 2013-04-17 ENCOUNTER — Ambulatory Visit (INDEPENDENT_AMBULATORY_CARE_PROVIDER_SITE_OTHER): Payer: Medicare Other | Admitting: Obstetrics & Gynecology

## 2013-04-17 ENCOUNTER — Encounter: Payer: Self-pay | Admitting: Obstetrics & Gynecology

## 2013-04-17 VITALS — BP 140/81 | HR 85 | Resp 16 | Wt 209.0 lb

## 2013-04-17 DIAGNOSIS — N950 Postmenopausal bleeding: Secondary | ICD-10-CM

## 2013-04-17 DIAGNOSIS — N882 Stricture and stenosis of cervix uteri: Secondary | ICD-10-CM

## 2013-04-17 NOTE — Patient Instructions (Signed)
Hysteroscopy  Hysteroscopy is a procedure used for looking inside the womb (uterus). It may be done for many different reasons, including:  · To evaluate abnormal bleeding, fibroid (benign, noncancerous) tumors, polyps, scar tissue (adhesions), and possibly cancer of the uterus.  · To look for lumps (tumors) and other uterine growths.  · To look for causes of why a woman cannot get pregnant (infertility), causes of recurrent loss of pregnancy (miscarriages), or a lost intrauterine device (IUD).  · To perform a sterilization by blocking the fallopian tubes from inside the uterus.  A hysteroscopy should be done right after a menstrual period to be sure you are not pregnant.  LET YOUR CAREGIVER KNOW ABOUT:   · Allergies.  · Medicines taken, including herbs, eyedrops, over-the-counter medicines, and creams.  · Use of steroids (by mouth or creams).  · Previous problems with anesthetics or numbing medicines.  · History of bleeding or blood problems.  · History of blood clots.  · Possibility of pregnancy, if this applies.  · Previous surgery.  · Other health problems.  RISKS AND COMPLICATIONS   · Putting a hole in the uterus.  · Excessive bleeding.  · Infection.  · Damage to the cervix.  · Injury to other organs.  · Allergic reaction to medicines.  · Too much fluid used in the uterus for the procedure.  BEFORE THE PROCEDURE   · Do not take aspirin or blood thinners for a week before the procedure, or as directed. It can cause bleeding.  · Arrive at least 60 minutes before the procedure or as directed to read and sign the necessary forms.  · Arrange for someone to take you home after the procedure.  · If you smoke, do not smoke for 2 weeks before the procedure.  PROCEDURE   · Your caregiver may give you medicine to relax you. He or she may also give you a medicine that numbs the area around the cervix (local anesthetic) or a medicine that makes you sleep (general anesthesia).  · Sometimes, a medicine is placed in the cervix  the day before the procedure. This medicine makes the cervix have a larger opening (dilate). This makes it easier for the instrument to be inserted into the uterus.  · A small instrument (hysteroscope) is inserted through the vagina into the uterus. This instrument is similar to a pencil-sized telescope with a light.  · During the procedure, air or a liquid is put into the uterus, which allows the surgeon to see better.  · Sometimes, tissue is gently scraped from inside the uterus. These tissue samples are sent to a specialist who looks at tissue samples (pathologist). The pathologist will give a report to your caregiver. This will help your caregiver decide if further treatment is necessary. The report will also help your caregiver decide on the best treatment if the test comes back abnormal.  AFTER THE PROCEDURE   · If you had a general anesthetic, you may be groggy for a couple hours after the procedure.  · If you had a local anesthetic, you will be advised to rest at the surgical center or caregiver's office until you are stable and feel ready to go home.  · You may have some cramping for a couple days.  · You may have bleeding, which varies from light spotting for a few days to menstrual-like bleeding for up to 3 to 7 days. This is normal.  · Have someone take you home.  FINDING OUT THE   RESULTS OF YOUR TEST  Not all test results are available during your visit. If your test results are not back during the visit, make an appointment with your caregiver to find out the results. Do not assume everything is normal if you have not heard from your caregiver or the medical facility. It is important for you to follow up on all of your test results.  HOME CARE INSTRUCTIONS   · Do not drive for 24 hours or as instructed.  · Only take over-the-counter or prescription medicines for pain, discomfort, or fever as directed by your caregiver.  · Do not take aspirin. It can cause or aggravate bleeding.  · Do not drive or drink  alcohol while taking pain medicine.  · You may resume your usual diet.  · Do not use tampons, douche, or have sexual intercourse for 2 weeks, or as advised by your caregiver.  · Rest and sleep for the first 24 to 48 hours.  · Take your temperature twice a day for 4 to 5 days. Write it down. Give these temperatures to your caregiver if they are abnormal (above 98.6° F or 37.0° C).  · Take medicines your caregiver has ordered as directed.  · Follow your caregiver's advice regarding diet, exercise, lifting, driving, and general activities.  · Take showers instead of baths for 2 weeks, or as recommended by your caregiver.  · If you develop constipation:  · Take a mild laxative with the advice of your caregiver.  · Eat bran foods.  · Drink enough water and fluids to keep your urine clear or pale yellow.  · Try to have someone with you or available to you for the first 24 to 48 hours, especially if you had a general anesthetic.  · Make sure you and your family understand everything about your operation and recovery.  · Follow your caregiver's advice regarding follow-up appointments and Pap smears.  SEEK MEDICAL CARE IF:   · You feel dizzy or lightheaded.  · You feel sick to your stomach (nauseous).  · You develop abnormal vaginal discharge.  · You develop a rash.  · You have an abnormal reaction or allergy to your medicine.  · You need stronger pain medicine.  SEEK IMMEDIATE MEDICAL CARE IF:   · Bleeding is heavier than a normal menstrual period or you have blood clots.  · You have an oral temperature above 102° F (38.9° C), not controlled by medicine.  · You have increasing cramps or pains not relieved with medicine.  · You develop belly (abdominal) pain that does not seem to be related to the same area of earlier cramping and pain.  · You pass out.  · You develop pain in the tops of your shoulders (shoulder strap areas).  · You develop shortness of breath.  MAKE SURE YOU:   · Understand these instructions.  · Will watch  your condition.  · Will get help right away if you are not doing well or get worse.  Document Released: 02/21/2001 Document Revised: 02/07/2012 Document Reviewed: 06/16/2009  ExitCare® Patient Information ©2013 [employer].

## 2013-04-17 NOTE — Progress Notes (Signed)
  Subjective:    Patient ID: Debra Campbell, female    DOB: 1931-12-07, 79 y.o.   MRN: [id number]  HPI No obstetric history on file. No LMP recorded. Patient is postmenopausal. She used the cytotec tablet per vagina last night for planned biopsy today. The procedure and the risk that the cervix would be stenotic was discussed.    Review of Systems No vaginal bleeding since the episode previously reported    Objective:   Physical Exam  Constitutional: She appears well-developed. No distress.  Genitourinary: Vagina normal.  cevix stenotic, unable to pass probe, dilate  Skin: Skin is warm and dry.  Psychiatric: She has a normal mood and affect.    Filed Vitals:   04/17/13 0943  BP: 140/81  Pulse: 85  Resp: 16  Weight: 209 lb (94.802 kg)         Assessment & Plan:  PMP bleeding, thickened endometrium , stenotic cervix, needs sampling for Dx. Will schedule hysteroscopy and D&C as outpatient.  Adam Phenix, MD 04/17/2013

## 2013-04-19 ENCOUNTER — Encounter (HOSPITAL_COMMUNITY)
Admission: RE | Admit: 2013-04-19 | Discharge: 2013-04-19 | Disposition: A | Discharge status: Home / Self Care | Payer: Medicare Other | Source: Ambulatory Visit | Attending: Obstetrics & Gynecology | Admitting: Obstetrics & Gynecology

## 2013-04-19 ENCOUNTER — Other Ambulatory Visit: Payer: Self-pay

## 2013-04-19 ENCOUNTER — Encounter (HOSPITAL_COMMUNITY): Payer: Self-pay

## 2013-04-19 DIAGNOSIS — R9389 Abnormal findings on diagnostic imaging of other specified body structures: Secondary | ICD-10-CM | POA: Diagnosis not present

## 2013-04-19 DIAGNOSIS — N840 Polyp of corpus uteri: Secondary | ICD-10-CM | POA: Diagnosis not present

## 2013-04-19 DIAGNOSIS — N882 Stricture and stenosis of cervix uteri: Secondary | ICD-10-CM | POA: Diagnosis not present

## 2013-04-19 DIAGNOSIS — N950 Postmenopausal bleeding: Secondary | ICD-10-CM | POA: Diagnosis not present

## 2013-04-19 HISTORY — DX: Unspecified osteoarthritis, unspecified site: M19.90

## 2013-04-19 HISTORY — DX: Hyperlipidemia, unspecified: E78.5

## 2013-04-19 HISTORY — DX: Hypothyroidism, unspecified: E03.9

## 2013-04-19 HISTORY — DX: Dermatitis, unspecified: L30.9

## 2013-04-19 LAB — BASIC METABOLIC PANEL
BUN: 17 mg/dL (ref 6–23)
CO2: 32 mEq/L (ref 19–32)
Chloride: 100 mEq/L (ref 96–112)
Creatinine, Ser: 1.03 mg/dL (ref 0.50–1.10)

## 2013-04-19 LAB — CBC
HCT: 37.7 % (ref 36.0–46.0)
Hemoglobin: 12.7 g/dL (ref 12.0–15.0)
MCV: 91.7 fL (ref 78.0–100.0)
RBC: 4.11 MIL/uL (ref 3.87–5.11)
WBC: 6.2 10*3/uL (ref 4.0–10.5)

## 2013-04-19 NOTE — Patient Instructions (Addendum)
   Your procedure is scheduled on: Thursday, May 29  Enter through the Main Entrance of [facility] at: 3 pm Pick up the phone at the desk and dial [phone] and inform us of your arrival.  Please call this number if you have any problems the morning of surgery: [phone]  Remember: Do not eat food after midnight: Wednesday Do not drink clear liquids after: 12:30pm Thursday Take these medicines the morning of surgery with a SIP OF WATER:  Aspirin, levothyroxine, HCTZ, Losartin.  Patient instructed to withhold Wednesday night's dose of metformin and withhold Thursday morning dose of metformin-day of surgery.  Do not wear jewelry, make-up, or FINGER nail polish No metal in your hair or on your body. Do not wear lotions, powders, perfumes. You may wear deodorant.  Please use your CHG wash as directed prior to surgery.  Do not shave anywhere for at least 12 hours prior to first CHG shower.  Do not bring valuables to the hospital. Contacts, dentures or bridgework may not be worn into surgery.  Patients discharged on the day of surgery will not be allowed to drive home.  Home with husband Charlann Boxer.

## 2013-04-26 ENCOUNTER — Ambulatory Visit (HOSPITAL_COMMUNITY)
Admission: RE | Admit: 2013-04-26 | Discharge: 2013-04-26 | Disposition: A | Discharge status: Home / Self Care | Payer: Medicare Other | Source: Ambulatory Visit | Attending: Obstetrics & Gynecology | Admitting: Obstetrics & Gynecology

## 2013-04-26 ENCOUNTER — Ambulatory Visit (HOSPITAL_COMMUNITY): Payer: Medicare Other | Admitting: Anesthesiology

## 2013-04-26 ENCOUNTER — Encounter (HOSPITAL_COMMUNITY): Payer: Self-pay | Admitting: Anesthesiology

## 2013-04-26 ENCOUNTER — Encounter (HOSPITAL_COMMUNITY): Payer: Self-pay | Admitting: *Deleted

## 2013-04-26 ENCOUNTER — Encounter (HOSPITAL_COMMUNITY)
Admission: RE | Disposition: A | Discharge status: Home / Self Care | Payer: Self-pay | Source: Ambulatory Visit | Attending: Obstetrics & Gynecology

## 2013-04-26 DIAGNOSIS — E119 Type 2 diabetes mellitus without complications: Secondary | ICD-10-CM | POA: Diagnosis not present

## 2013-04-26 DIAGNOSIS — R9389 Abnormal findings on diagnostic imaging of other specified body structures: Secondary | ICD-10-CM | POA: Diagnosis not present

## 2013-04-26 DIAGNOSIS — N840 Polyp of corpus uteri: Secondary | ICD-10-CM

## 2013-04-26 DIAGNOSIS — I10 Essential (primary) hypertension: Secondary | ICD-10-CM | POA: Diagnosis not present

## 2013-04-26 DIAGNOSIS — N949 Unspecified condition associated with female genital organs and menstrual cycle: Secondary | ICD-10-CM | POA: Diagnosis not present

## 2013-04-26 DIAGNOSIS — N950 Postmenopausal bleeding: Secondary | ICD-10-CM | POA: Insufficient documentation

## 2013-04-26 DIAGNOSIS — N882 Stricture and stenosis of cervix uteri: Secondary | ICD-10-CM | POA: Insufficient documentation

## 2013-04-26 HISTORY — PX: HYSTEROSCOPY WITH D & C: SHX1775

## 2013-04-26 LAB — GLUCOSE, CAPILLARY

## 2013-04-26 SURGERY — DILATATION AND CURETTAGE /HYSTEROSCOPY
Anesthesia: General | Site: Uterus

## 2013-04-26 MED ORDER — ONDANSETRON HCL 4 MG/2ML IJ SOLN
INTRAMUSCULAR | Status: DC | PRN
  Administered 2013-04-26: 4 mg via INTRAVENOUS

## 2013-04-26 MED ORDER — LACTATED RINGERS IV SOLN
INTRAVENOUS | Status: DC
  Administered 2013-04-26 (×3): via INTRAVENOUS

## 2013-04-26 MED ORDER — ONDANSETRON HCL 4 MG/2ML IJ SOLN
INTRAMUSCULAR | Status: AC
  Filled 2013-04-26: qty 2

## 2013-04-26 MED ORDER — HYDROCODONE-ACETAMINOPHEN 5-325 MG PO TABS: 1.0000 | ORAL_TABLET | Freq: Four times a day (QID) | ORAL | Status: DC | PRN

## 2013-04-26 MED ORDER — PROPOFOL 10 MG/ML IV EMUL
INTRAVENOUS | Status: AC
  Filled 2013-04-26: qty 20

## 2013-04-26 MED ORDER — LACTATED RINGERS IR SOLN
Status: DC | PRN
  Administered 2013-04-26: 3000 mL

## 2013-04-26 MED ORDER — FENTANYL CITRATE 0.05 MG/ML IJ SOLN
INTRAMUSCULAR | Status: AC
  Filled 2013-04-26: qty 2

## 2013-04-26 MED ORDER — FENTANYL CITRATE 0.05 MG/ML IJ SOLN
INTRAMUSCULAR | Status: DC | PRN
  Administered 2013-04-26 (×2): 50 ug via INTRAVENOUS

## 2013-04-26 MED ORDER — PROPOFOL 10 MG/ML IV BOLUS
INTRAVENOUS | Status: DC | PRN
  Administered 2013-04-26: 140 mg via INTRAVENOUS

## 2013-04-26 MED ORDER — BUPIVACAINE HCL 0.25 % IJ SOLN
INTRAMUSCULAR | Status: DC | PRN
  Administered 2013-04-26: 10 mL

## 2013-04-26 MED ORDER — MIDAZOLAM HCL 5 MG/5ML IJ SOLN
INTRAMUSCULAR | Status: DC | PRN
  Administered 2013-04-26: 1 mg via INTRAVENOUS

## 2013-04-26 MED ORDER — LIDOCAINE HCL (CARDIAC) 20 MG/ML IV SOLN
INTRAVENOUS | Status: AC
  Filled 2013-04-26: qty 5

## 2013-04-26 MED ORDER — FENTANYL CITRATE 0.05 MG/ML IJ SOLN: 25.0000 ug | INTRAMUSCULAR | Status: DC | PRN

## 2013-04-26 MED ORDER — MIDAZOLAM HCL 2 MG/2ML IJ SOLN
INTRAMUSCULAR | Status: AC
  Filled 2013-04-26: qty 2

## 2013-04-26 MED ORDER — LIDOCAINE HCL (CARDIAC) 20 MG/ML IV SOLN
INTRAVENOUS | Status: DC | PRN
  Administered 2013-04-26: 30 mg via INTRAVENOUS

## 2013-04-26 SURGICAL SUPPLY — 20 items
ABLATOR ENDOMETRIAL BIPOLAR (ABLATOR) IMPLANT
CANISTER SUCTION 2500CC (MISCELLANEOUS) ×2 IMPLANT
CATH ROBINSON RED A/P 16FR (CATHETERS) ×2 IMPLANT
CLOTH BEACON ORANGE TIMEOUT ST (SAFETY) ×2 IMPLANT
CONTAINER PREFILL 10% NBF 60ML (FORM) ×4 IMPLANT
CORD ACTIVE DISPOSABLE (ELECTRODE)
CORD ELECTRO ACTIVE DISP (ELECTRODE) IMPLANT
DRESSING TELFA 8X3 (GAUZE/BANDAGES/DRESSINGS) ×2 IMPLANT
ELECT LOOP GYNE PRO 24FR (CUTTING LOOP)
ELECT REM PT RETURN 9FT ADLT (ELECTROSURGICAL) ×2
ELECTRODE LOOP GYNE PRO 24FR (CUTTING LOOP) IMPLANT
ELECTRODE REM PT RTRN 9FT ADLT (ELECTROSURGICAL) ×1 IMPLANT
GLOVE BIO SURGEON STRL SZ 6.5 (GLOVE) ×2 IMPLANT
GLOVE BIOGEL PI IND STRL 7.0 (GLOVE) ×1 IMPLANT
GLOVE BIOGEL PI INDICATOR 7.0 (GLOVE) ×1
GOWN STRL REIN XL XLG (GOWN DISPOSABLE) ×4 IMPLANT
PACK HYSTEROSCOPY LF (CUSTOM PROCEDURE TRAY) ×2 IMPLANT
PAD OB MATERNITY 4.3X12.25 (PERSONAL CARE ITEMS) ×2 IMPLANT
TOWEL OR 17X24 6PK STRL BLUE (TOWEL DISPOSABLE) ×4 IMPLANT
WATER STERILE IRR 1000ML POUR (IV SOLUTION) ×2 IMPLANT

## 2013-04-26 NOTE — Transfer of Care (Signed)
Immediate Anesthesia Transfer of Care Note  Patient: Debra Campbell  Procedure(s) Performed: Procedure(s): DILATATION AND CURETTAGE /HYSTEROSCOPY (N/A)  Patient Location: PACU  Anesthesia Type:General  Level of Consciousness: awake, alert , oriented and patient cooperative  Airway & Oxygen Therapy: Patient Spontanous Breathing  Post-op Assessment: Report given to PACU RN, Post -op Vital signs reviewed and stable and Patient moving all extremities X 4  Post vital signs: Reviewed and stable  Complications: No apparent anesthesia complications

## 2013-04-26 NOTE — H&P (Signed)
Debra Campbell is an 79 y.o. female. [name] LMP age 19. She had vaginal spotting in February for about 2 weeks, none since then. US showed 18 mm endometrium. Her cervix is stenotic and we have been unable to dilate and perform endometrial biopsy. She is scheduled today for hysteroscopy and dilation and curettage, the procedure and risks were discussed.    Pertinent Gynecological History: Menses: post-menopausal Bleeding: post menopausal bleeding Contraception: none DES exposure: denies Blood transfusions: none Sexually transmitted diseases: no past history Previous GYN Procedures:    Last mammogram: normal   OB History: G3, P2   Menstrual History:  No LMP recorded. Patient is postmenopausal.    Past Medical History  Diagnosis Date  . Diabetes mellitus   . Thyroid disease   . Hypertension   . SVD (spontaneous vaginal delivery)     x 3  . Hypothyroidism   . Eczema   . Hyperlipidemia     no meds  . Kidney stones     no surgery required  . Arthritis     legs, lower back, hands    Past Surgical History  Procedure Laterality Date  . Lt knee arthroscopy    . Rt total hip replacement    . Ectopic pregnancy surgery    . Laparotomy      for ectopic pregnancy  . Tonsillectomy    . Wisdom tooth extraction    . Joint replacement      Family History  Problem Relation Age of Onset  . Heart disease Father   . Stroke Father   . Diabetes Father   . Breast cancer Daughter     Social History:  reports that she has never smoked. She has never used smokeless tobacco. She reports that she does not drink alcohol or use illicit drugs.  Allergies:  Allergies  Allergen Reactions  . Ace Inhibitors Cough  . Iohexol      Desc: HIVES 40 YEARS AGO   . Statins     myalgia  . Iodine Rash  . Penicillins Rash  . Sulfa Antibiotics Rash    No prescriptions prior to admission    Review of Systems  Constitutional: Negative for fever and malaise/fatigue.  Respiratory: Negative  for cough.   Gastrointestinal: Negative for vomiting.  Genitourinary:       No bleeding  Psychiatric/Behavioral: The patient is not nervous/anxious.     There were no vitals taken for this visit. Physical Exam  Nursing note and vitals reviewed. Constitutional: She appears well-developed and well-nourished. No distress.  Respiratory: Effort normal. No respiratory distress.  GI: Soft. She exhibits no distension. There is no tenderness.  Skin: Skin is warm and dry.  Psychiatric: She has a normal mood and affect. Her behavior is normal.    No results found for this or any previous visit (from the past 24 hour(s)). *RADIOLOGY REPORT*  Clinical Data: Postmenopausal bleeding.  TRANSABDOMINAL AND TRANSVAGINAL ULTRASOUND OF PELVIS  Technique: Both transabdominal and transvaginal ultrasound  examinations of the pelvis were performed. Transabdominal technique  was performed for global imaging of the pelvis including uterus,  ovaries, adnexal regions, and pelvic cul-de-sac.  It was necessary to proceed with endovaginal exam following the  transabdominal exam to visualize the endometrium and adnexa.  Comparison: CT 01/2012  Findings:  Uterus: Is anteverted and anteflexed demonstrates a sagittal length  of 7.9 cm, depth of 4.5 cm and width of 7.3 cm.  Endometrium: Is heterogeneous and abnormally thickened with a  poorly defined  endometrial myometrial interface and measurable  width of at least 18 mm in the midsagittal plane. More pronounced  thickening is suggested lateral to the midline on several views and  transverse cine views of the uterus are worrisome for myometrial  thinning in the right posterior lower uterine segment. Color  Doppler demonstrates flow within the thickened endometrial lining  and with this appearance the possibility of endometrial carcinoma  with myometrial invasion needs to be excluded.  Right ovary: Is not seen with confidence either transabdominally  or  endovaginally  Left ovary: Normal postmenopausal appearance measuring 1.0 x 2.0 x  1.0 cm  Other findings: No pelvic fluid is seen.  IMPRESSION:  Irregular heterogeneous thickening of the endometrial lining with  findings suggesting associated myometrial thinning in the right  posterior lower uterine segment. This combined with intralesional  flow is concerning for the possibility of endometrial carcinoma  with myometrial invasion. Endometrial biopsy is recommended.  This report was called to [facility] in the [facility].  Original Report Authenticated By: Rhodia Albright, M.D. CBC    Component Value Date/Time   WBC 6.2 04/19/2013 1040   RBC 4.11 04/19/2013 1040   HGB 12.7 04/19/2013 1040   HCT 37.7 04/19/2013 1040   PLT 175 04/19/2013 1040   MCV 91.7 04/19/2013 1040   MCH 30.9 04/19/2013 1040   MCHC 33.7 04/19/2013 1040   RDW 13.6 04/19/2013 1040   LYMPHSABS 0.3* 02/07/2012 1000   MONOABS 0.4 02/07/2012 1000   EOSABS 0.0 02/07/2012 1000   BASOSABS 0.0 02/07/2012 1000      Assessment/Plan: Postmenopausal bleeding and cervical stenosis. Hysteroscopy and dilation and curettage.the procedure and the risk that the cervx cannot be dilated, anesthesia, bleeding, pelvic organ damage, infection, pain discussed and questions were answered.  [name] 04/26/2013, 8:12 AM

## 2013-04-26 NOTE — Op Note (Signed)
Procedure: Hysteroscopy and dilation and curettage Preoperative diagnosis: Postmenopausal bleeding and cervical stenosis with thickened endometrium Postoperative diagnosis: Endometrial polyps, possible hyperplasia Anesthesia: Gen. endotracheal Surgeon: Dr. Scheryl Darter Specimen: Endometrial curettings Estimated blood loss: 25 mL Complications: None  Counts correct  Patient gave written consent for hysteroscopy and dilation and curettage. She had an episode of postmenopausal bleeding an ultrasound showed 18 mm endometrial thickness. Was unable to dilate her cervix to 2 cervical stenosis in the office. Patient identification was confirmed and she was brought to the operating room and laid and general anesthesia was induced. She's placed in dorsal lithotomy position. Perineum and vagina were sterilely prepped and draped the bladder was drained with a red rubber catheter. Speculum was inserted and cervix was infiltrated with quarter percent Marcaine 10 mL. Cervix was grasped with single-tooth tenaculum. Cervix appeared to be stenotic. A lacrimal dilator was used to locate a small opening at the os which was dilated sufficiently to proceed with hysteroscopy. Uterus sounded to 8-1/2 cm. Hysteroscope was inserted and 1.5 glycine was used and the video camera was used. Polypoid tissues was seen  consistent with hyperplasia. I was unable to visualize the tubal ostia. Hysteroscope was removed and sharp curettage was performed and tissue specimen was obtained. This was sent to pathology. There was minimal bleeding at the end of procedure and all instruments were removed. She tolerated the procedure without complications and she was brought in stable condition to PACU.  Adam Phenix, MD 04/26/2013 7:17 PM

## 2013-04-26 NOTE — Anesthesia Preprocedure Evaluation (Signed)
Anesthesia Evaluation  Patient identified by MRN, date of birth, ID band Patient awake    Reviewed: Allergy & Precautions, H&P , Patient's Chart, lab work & pertinent test results, reviewed documented beta blocker date and time   Airway Mallampati: II TM Distance: >3 FB Neck ROM: full    Dental no notable dental hx.    Pulmonary  breath sounds clear to auscultation  Pulmonary exam normal       Cardiovascular hypertension, On Medications Rhythm:regular Rate:Normal     Neuro/Psych    GI/Hepatic   Endo/Other  diabetes, Type obesity  Renal/GU      Musculoskeletal   Abdominal   Peds  Hematology   Anesthesia Other Findings   Reproductive/Obstetrics                           Anesthesia Physical Anesthesia Plan  ASA: III  Anesthesia Plan: General   Post-op Pain Management:    Induction: Intravenous  Airway Management Planned: LMA  Additional Equipment:   Intra-op Plan:   Post-operative Plan:   Informed Consent: I have reviewed the patients History and Physical, chart, labs and discussed the procedure including the risks, benefits and alternatives for the proposed anesthesia with the patient or authorized representative who has indicated his/her understanding and acceptance.   Dental Advisory Given  Plan Discussed with: CRNA and Surgeon  Anesthesia Plan Comments: (  Discussed  general anesthesia, including possible nausea, instrumentation of airway, sore throat,pulmonary aspiration, etc. I asked if the were any outstanding questions, or  concerns before we proceeded. )        Anesthesia Quick Evaluation

## 2013-04-26 NOTE — Anesthesia Postprocedure Evaluation (Signed)
Anesthesia Post Note  Patient: Debra Campbell  Procedure(s) Performed: Procedure(s) (LRB): DILATATION AND CURETTAGE /HYSTEROSCOPY (N/A)  Anesthesia type: General  Patient location: PACU  Post pain: Pain level controlled  Post assessment: Post-op Vital signs reviewed  Last Vitals:  Filed Vitals:   04/26/13 2000  BP: 143/70  Pulse:   Temp:   Resp:     Post vital signs: Reviewed  Level of consciousness: sedated  Complications: No apparent anesthesia complications

## 2013-04-27 ENCOUNTER — Encounter (HOSPITAL_COMMUNITY): Payer: Self-pay | Admitting: Obstetrics & Gynecology

## 2013-04-27 DIAGNOSIS — C549 Malignant neoplasm of corpus uteri, unspecified: Secondary | ICD-10-CM | POA: Diagnosis not present

## 2013-05-08 ENCOUNTER — Ambulatory Visit (INDEPENDENT_AMBULATORY_CARE_PROVIDER_SITE_OTHER): Payer: Medicare Other | Admitting: Obstetrics & Gynecology

## 2013-05-08 ENCOUNTER — Encounter: Payer: Self-pay | Admitting: Obstetrics & Gynecology

## 2013-05-08 DIAGNOSIS — C549 Malignant neoplasm of corpus uteri, unspecified: Secondary | ICD-10-CM

## 2013-05-08 DIAGNOSIS — C541 Malignant neoplasm of endometrium: Secondary | ICD-10-CM | POA: Insufficient documentation

## 2013-05-08 DIAGNOSIS — Z09 Encounter for follow-up examination after completed treatment for conditions other than malignant neoplasm: Secondary | ICD-10-CM

## 2013-05-08 DIAGNOSIS — Z8542 Personal history of malignant neoplasm of other parts of uterus: Secondary | ICD-10-CM | POA: Insufficient documentation

## 2013-05-08 NOTE — Progress Notes (Signed)
Patient ID: Debra Campbell, female   DOB: [birth year], 79 y.o.   MRN: [id number]  No LMP recorded. Patient is postmenopausal. S/P hysteroscopy, dilation and curettage 04/27/13, no bleeding or pain.  Dx FIGO grade 1 endometrial carcinoma in background of atypical hyperplasia. I explained the diagnosis and the indication to refer to gyn oncology for further management. The prognosis and the need for surgery were discussed and her questions were answered.   Adam Phenix, MD 05/08/2013

## 2013-05-08 NOTE — Patient Instructions (Signed)
Hysterectomy Information  A hysterectomy is a procedure where your uterus is surgically removed. It will no longer be possible to have menstrual periods or to become pregnant. The tubes and ovaries can be removed (bilateral salpingo-oopherectomy) during this surgery as well.  REASONS FOR A HYSTERECTOMY  Persistent, abnormal bleeding.  Lasting (chronic) pelvic pain or infection.  The lining of the uterus (endometrium) starts growing outside the uterus (endometriosis).  The endometrium starts growing in the muscle of the uterus (adenomyosis).  The uterus falls down into the vagina (pelvic organ prolapse).  Symptomatic uterine fibroids.  Precancerous cells.  Cervical cancer or uterine cancer. TYPES OF HYSTERECTOMIES  Supracervical hysterectomy. This type removes the top part of the uterus, but not the cervix.  Total hysterectomy. This type removes the uterus and cervix.  Radical hysterectomy. This type removes the uterus, cervix, and the fibrous tissue that holds the uterus in place in the pelvis (parametrium). WAYS A HYSTERECTOMY CAN BE PERFORMED  Abdominal hysterectomy. A large surgical cut (incision) is made in the abdomen. The uterus is removed through this incision.  Vaginal hysterectomy. An incision is made in the vagina. The uterus is removed through this incision. There are no abdominal incisions.  Conventional laparoscopic hysterectomy. A thin, lighted tube with a camera (laparoscope) is inserted into 3 or 4 small incisions in the abdomen. The uterus is cut into small pieces. The small pieces are removed through the incisions, or they are removed through the vagina.  Laparoscopic assisted vaginal hysterectomy (LAVH). Three or four small incisions are made in the abdomen. Part of the surgery is performed laparoscopically and part vaginally. The uterus is removed through the vagina.  Robot-assisted laparoscopic hysterectomy. A laparoscope is inserted into 3 or 4 small  incisions in the abdomen. A computer-controlled device is used to give the surgeon a 3D image. This allows for more precise movements of surgical instruments. The uterus is cut into small pieces and removed through the incisions or removed through the vagina. RISKS OF HYSTERECTOMY   Bleeding and risk of blood transfusion. Tell your caregiver if you do not want to receive any blood products.  Blood clots in the legs or lung.  Infection.  Injury to surrounding organs.  Anesthesia problems or side effects.  Conversion to an abdominal hysterectomy. WHAT TO EXPECT AFTER A HYSTERECTOMY  You will be given pain medicine.  You will need to have someone with you for the first 3 to 5 days after you go home.  You will need to follow up with your surgeon in 2 to 4 weeks after surgery to evaluate your progress.  You may have early menopause symptoms like hot flashes, night sweats, and insomnia.  If you had a hysterectomy for a problem that was not a cancer or a condition that could lead to cancer, then you no longer need Pap tests. However, even if you no longer need a Pap test, a regular exam is a good idea to make sure no other problems are starting. Document Released: 05/11/2001 Document Revised: 02/07/2012 Document Reviewed: 06/26/2011 [employer] Patient Information 2014 [employer].  

## 2013-05-25 ENCOUNTER — Encounter: Payer: Self-pay | Admitting: Gynecology

## 2013-05-25 ENCOUNTER — Ambulatory Visit: Payer: Medicare Other | Attending: Gynecology | Admitting: Gynecology

## 2013-05-25 VITALS — BP 134/70 | HR 68 | Temp 97.7°F | Resp 16 | Ht 64.41 in | Wt 214.7 lb

## 2013-05-25 DIAGNOSIS — Z96649 Presence of unspecified artificial hip joint: Secondary | ICD-10-CM | POA: Insufficient documentation

## 2013-05-25 DIAGNOSIS — E039 Hypothyroidism, unspecified: Secondary | ICD-10-CM | POA: Insufficient documentation

## 2013-05-25 DIAGNOSIS — Z79899 Other long term (current) drug therapy: Secondary | ICD-10-CM | POA: Diagnosis not present

## 2013-05-25 DIAGNOSIS — C549 Malignant neoplasm of corpus uteri, unspecified: Secondary | ICD-10-CM | POA: Insufficient documentation

## 2013-05-25 DIAGNOSIS — C541 Malignant neoplasm of endometrium: Secondary | ICD-10-CM

## 2013-05-25 DIAGNOSIS — E119 Type 2 diabetes mellitus without complications: Secondary | ICD-10-CM | POA: Diagnosis not present

## 2013-05-25 NOTE — Progress Notes (Signed)
Consult Note: Gyn-Onc   Debra Campbell 79 y.o. female  Chief Complaint  Patient presents with  . Endometrial cancer    Follow up    Assessment : Grade 1 endometrial carcinoma.  Plan: I discussed management options with the patient and her family. Given her extensive prior incision,  I would recommend we proceed with a total abdominal hysterectomy bilateral salpingo-oophorectomy and possibly lymphadenectomy depending upon depth of invasion. The risks of surgery including hemorrhage infection injury to adjacent viscera thromboembolic complications were discussed. The patient, her husband, and son had all the questions answered. We will proceed with scheduling surgery for 06/19/2013.   HPI: Patient is seen in consultation request of Dr. Debroah Loop regarding management of a newly diagnosed endometrial carcinoma. The patient had some abnormal bleeding approximately 3 months ago. She underwent a D&C on 04/27/2013 revealing a grade 1 endometrial carcinoma in a background of atypical complex hyperplasia.  Patient denies any constitutional symptoms. She's not having any pelvic pain. Currently she is not having any bleeding. Ultrasound exam shows a uterus measures 7.9 x 4.5 x 7.3 cm.  She has a past history of a laparotomy for an ectopic pregnancy. She's also had a total hip replacement.  Review of Systems:10 point review of systems is negative except as noted in interval history.   Vitals: Blood pressure 134/70, pulse 68, temperature 97.7 F (36.5 C), temperature source Oral, resp. rate 16, height 5' 4.41" (1.636 m), weight 97.387 kg (214 lb 11.2 oz).  Physical Exam: General : The patient is a healthy woman in no acute distress.  HEENT: normocephalic, extraoccular movements normal; neck is supple without thyromegally  Lynphnodes: Supraclavicular and inguinal nodes not enlarged  Abdomen: Soft, non-tender, no ascites, no organomegally, no masses, small umbilical hernia. Pelvic:  EGBUS: Normal  female  Vagina: Normal, no lesions  Urethra and Bladder: Normal, non-tender  Cervix: Normal Uterus: Normal shape size and consistency Bi-manual examination: Non-tender; no adenxal masses or nodularity  Rectal: normal sphincter tone, no masses, no blood  Lower extremities: No edema or varicosities. Normal range of motion      Allergies  Allergen Reactions  . Ace Inhibitors Cough  . Iohexol      Desc: HIVES 40 YEARS AGO   . Statins     myalgia  . Iodine Rash  . Penicillins Rash  . Sulfa Antibiotics Rash    Past Medical History  Diagnosis Date  . Diabetes mellitus   . Thyroid disease   . Hypertension   . SVD (spontaneous vaginal delivery)     x 3  . Hypothyroidism   . Eczema   . Hyperlipidemia     no meds  . Kidney stones     no surgery required  . Arthritis     legs, lower back, hands    Past Surgical History  Procedure Laterality Date  . Lt knee arthroscopy    . Rt total hip replacement    . Ectopic pregnancy surgery    . Laparotomy      for ectopic pregnancy  . Tonsillectomy    . Wisdom tooth extraction    . Joint replacement    . Hysteroscopy w/d&c N/A 04/26/2013    Procedure: DILATATION AND CURETTAGE /HYSTEROSCOPY;  Surgeon: Adam Phenix, MD;  Location: [facility];  Service: Gynecology;  Laterality: N/A;    Current Outpatient Prescriptions  Medication Sig Dispense Refill  . aspirin 81 MG tablet Take 81 mg by mouth daily.        [name]  diclofenac (VOLTAREN) 75 MG EC tablet Take 75 mg by mouth at bedtime.      [name] ezetimibe (ZETIA) 10 MG tablet Take 1 tablet (10 mg total) by mouth daily.  90 tablet  1  . gabapentin (NEURONTIN) 100 MG capsule Take 1 capsule (100 mg total) by mouth at bedtime.  90 capsule  1  . hydrochlorothiazide (HYDRODIURIL) 25 MG tablet Take 1 tablet (25 mg total) by mouth daily.  90 tablet  1  . HYDROcodone-acetaminophen (NORCO/VICODIN) 5-325 MG per tablet Take 1 tablet by mouth every 6 (six) hours as needed for pain.  15 tablet  0  .  levothyroxine (SYNTHROID, LEVOTHROID) 175 MCG tablet Take 1 tablet (175 mcg total) by mouth daily before breakfast.  90 tablet  1  . losartan (COZAAR) 50 MG tablet Take 1 tablet (50 mg total) by mouth daily.  90 tablet  1  . metFORMIN (GLUCOPHAGE) 500 MG tablet Take 1 tablet (500 mg total) by mouth 2 (two) times daily with a meal.  180 tablet  1  . misoprostol (CYTOTEC) 200 MCG tablet One tablet per vagina hs prior to next appointment  1 tablet  1  . Triamcinolone Acetonide (TRIAMCINOLONE 0.1 % CREAM : EUCERIN) CREA Apply 1 application topically 2 (two) times daily.  60 each  2  . [DISCONTINUED] Pitavastatin Calcium (LIVALO) 1 MG TABS Take 1 tablet by mouth daily.         No current facility-administered medications for this visit.    History   Social History  . Marital Status: Married    Spouse Name: N/A    Number of Children: N/A  . Years of Education: N/A   Occupational History  . Not on file.   Social History Main Topics  . Smoking status: Never Smoker   . Smokeless tobacco: Never Used  . Alcohol Use: No  . Drug Use: No  . Sexually Active: No   Other Topics Concern  . Not on file   Social History Narrative  . No narrative on file    Family History  Problem Relation Age of Onset  . Heart disease Father   . Stroke Father   . Diabetes Father   . Breast cancer Daughter       Jeannette Corpus, MD 05/25/2013, 12:01 PM

## 2013-05-25 NOTE — Patient Instructions (Addendum)
We will schedule surgery for 06/19/2013.

## 2013-06-18 ENCOUNTER — Other Ambulatory Visit: Payer: Self-pay | Admitting: Nurse Practitioner

## 2013-06-19 ENCOUNTER — Encounter (HOSPITAL_COMMUNITY): Payer: Self-pay | Admitting: Pharmacy Technician

## 2013-06-21 ENCOUNTER — Other Ambulatory Visit (HOSPITAL_COMMUNITY): Payer: Self-pay | Admitting: *Deleted

## 2013-06-22 ENCOUNTER — Other Ambulatory Visit: Payer: Self-pay | Admitting: *Deleted

## 2013-06-22 ENCOUNTER — Encounter (HOSPITAL_COMMUNITY): Payer: Self-pay

## 2013-06-22 ENCOUNTER — Ambulatory Visit (HOSPITAL_COMMUNITY)
Admission: RE | Admit: 2013-06-22 | Discharge: 2013-06-22 | Disposition: A | Discharge status: Home / Self Care | Payer: Medicare Other | Source: Ambulatory Visit | Attending: Gynecologic Oncology | Admitting: Gynecologic Oncology

## 2013-06-22 ENCOUNTER — Encounter (HOSPITAL_COMMUNITY)
Admission: RE | Admit: 2013-06-22 | Discharge: 2013-06-22 | Disposition: A | Discharge status: Home / Self Care | Payer: Medicare Other | Source: Ambulatory Visit | Attending: Gynecology | Admitting: Gynecology

## 2013-06-22 DIAGNOSIS — I10 Essential (primary) hypertension: Secondary | ICD-10-CM | POA: Diagnosis not present

## 2013-06-22 DIAGNOSIS — C549 Malignant neoplasm of corpus uteri, unspecified: Secondary | ICD-10-CM | POA: Diagnosis not present

## 2013-06-22 HISTORY — DX: Malignant (primary) neoplasm, unspecified: C80.1

## 2013-06-22 HISTORY — DX: Headache: R51

## 2013-06-22 HISTORY — DX: Personal history of urinary calculi: Z87.442

## 2013-06-22 LAB — CBC WITH DIFFERENTIAL/PLATELET
Basophils Relative: 0 % (ref 0–1)
Eosinophils Absolute: 0.3 10*3/uL (ref 0.0–0.7)
Eosinophils Relative: 5 % (ref 0–5)
HCT: 39.4 % (ref 36.0–46.0)
Hemoglobin: 13 g/dL (ref 12.0–15.0)
MCH: 30.2 pg (ref 26.0–34.0)
MCHC: 33 g/dL (ref 30.0–36.0)
MCV: 91.6 fL (ref 78.0–100.0)
Monocytes Absolute: 0.5 10*3/uL (ref 0.1–1.0)
Monocytes Relative: 9 % (ref 3–12)

## 2013-06-22 LAB — COMPREHENSIVE METABOLIC PANEL
Albumin: 3.6 g/dL (ref 3.5–5.2)
BUN: 18 mg/dL (ref 6–23)
Chloride: 101 mEq/L (ref 96–112)
Creatinine, Ser: 0.96 mg/dL (ref 0.50–1.10)
Total Bilirubin: 0.4 mg/dL (ref 0.3–1.2)
Total Protein: 6.9 g/dL (ref 6.0–8.3)

## 2013-06-22 NOTE — Patient Instructions (Signed)
[address] RAYSHELL GOECKE  06/22/2013   Your procedure is scheduled on: 06/26/13  Report to [facility] at 6:15 AM.  Call this number if you have problems the morning of surgery [phone]: [phone]   Remember:please follow bowel prep/clear liquid diet. Please do not take diabetic medicine on the day of surgery.    Do not eat food or drink liquids After Midnight.     Take these medicines the morning of surgery with A SIP OF WATER: levothyroxine   Do not wear jewelry, make-up or nail polish.  Do not wear lotions, powders, or perfumes. You may wear deodorant.  Do not shave 48 hours prior to surgery. Men may shave face and neck.  Do not bring valuables to the hospital.  Contacts, dentures or bridgework may not be worn into surgery.  Leave suitcase in the car. After surgery it may be brought to your room.  For patients admitted to the hospital, checkout time is 11:00 AM the day of discharge.    Please read over the following fact sheets that you were given: incentive spirometry fact sheet, blood fact sheet, clear liquids fact sheet Birdie Sons, RN  pre op nurse call if needed [phone]    FAILURE TO FOLLOW THESE INSTRUCTIONS MAY RESULT IN CANCELLATION OF YOUR SURGERY   Patient Signature: ___________________________________________

## 2013-06-22 NOTE — Progress Notes (Signed)
EKG 03/2013 on EPIC

## 2013-06-25 ENCOUNTER — Telehealth: Payer: Self-pay | Admitting: Gynecologic Oncology

## 2013-06-25 NOTE — Telephone Encounter (Signed)
Spoke with patient about pre-operative status.  She has started taking in clear liquids only this am.  Plans to begin bowel prep in at 10 am today.  No concerns voiced.  Instructed to call for any needs.  Verbalizing understanding of instructions for surgery tomorrow morning.  

## 2013-06-26 ENCOUNTER — Encounter (HOSPITAL_COMMUNITY): Payer: Self-pay | Admitting: Anesthesiology

## 2013-06-26 ENCOUNTER — Encounter (HOSPITAL_COMMUNITY): Payer: Self-pay | Admitting: *Deleted

## 2013-06-26 ENCOUNTER — Inpatient Hospital Stay (HOSPITAL_COMMUNITY)
Admission: RE | Admit: 2013-06-26 | Discharge: 2013-06-28 | DRG: 741 | Disposition: A | Discharge status: Home / Self Care | Payer: Medicare Other | Source: Ambulatory Visit | Attending: Obstetrics & Gynecology | Admitting: Obstetrics & Gynecology

## 2013-06-26 ENCOUNTER — Inpatient Hospital Stay (HOSPITAL_COMMUNITY): Payer: Medicare Other | Admitting: Anesthesiology

## 2013-06-26 ENCOUNTER — Encounter (HOSPITAL_COMMUNITY)
Admission: RE | Disposition: A | Discharge status: Home / Self Care | Payer: Self-pay | Source: Ambulatory Visit | Attending: Obstetrics & Gynecology

## 2013-06-26 DIAGNOSIS — Z8542 Personal history of malignant neoplasm of other parts of uterus: Secondary | ICD-10-CM | POA: Diagnosis present

## 2013-06-26 DIAGNOSIS — E119 Type 2 diabetes mellitus without complications: Secondary | ICD-10-CM | POA: Diagnosis not present

## 2013-06-26 DIAGNOSIS — E039 Hypothyroidism, unspecified: Secondary | ICD-10-CM | POA: Diagnosis present

## 2013-06-26 DIAGNOSIS — K429 Umbilical hernia without obstruction or gangrene: Secondary | ICD-10-CM | POA: Diagnosis not present

## 2013-06-26 DIAGNOSIS — E785 Hyperlipidemia, unspecified: Secondary | ICD-10-CM | POA: Diagnosis present

## 2013-06-26 DIAGNOSIS — C541 Malignant neoplasm of endometrium: Secondary | ICD-10-CM | POA: Diagnosis present

## 2013-06-26 DIAGNOSIS — Z9079 Acquired absence of other genital organ(s): Secondary | ICD-10-CM | POA: Diagnosis not present

## 2013-06-26 DIAGNOSIS — Z96649 Presence of unspecified artificial hip joint: Secondary | ICD-10-CM

## 2013-06-26 DIAGNOSIS — I10 Essential (primary) hypertension: Secondary | ICD-10-CM | POA: Diagnosis not present

## 2013-06-26 DIAGNOSIS — R899 Unspecified abnormal finding in specimens from other organs, systems and tissues: Secondary | ICD-10-CM | POA: Diagnosis not present

## 2013-06-26 DIAGNOSIS — C549 Malignant neoplasm of corpus uteri, unspecified: Principal | ICD-10-CM | POA: Diagnosis present

## 2013-06-26 HISTORY — PX: BACK SURGERY: SHX140

## 2013-06-26 HISTORY — PX: LAPAROTOMY: SHX154

## 2013-06-26 HISTORY — PX: ABDOMINAL HYSTERECTOMY: SHX81

## 2013-06-26 LAB — TYPE AND SCREEN
ABO/RH(D): A POS
Antibody Screen: NEGATIVE

## 2013-06-26 LAB — GLUCOSE, CAPILLARY

## 2013-06-26 SURGERY — LAPAROTOMY, EXPLORATORY
Anesthesia: General

## 2013-06-26 MED ORDER — HYDROMORPHONE HCL PF 1 MG/ML IJ SOLN
0.2500 mg | INTRAMUSCULAR | Status: DC | PRN
  Administered 2013-06-26 (×3): 0.5 mg via INTRAVENOUS

## 2013-06-26 MED ORDER — OXYCODONE HCL 5 MG PO TABS: 5.0000 mg | ORAL_TABLET | ORAL | Status: DC | PRN

## 2013-06-26 MED ORDER — LACTATED RINGERS IV SOLN
INTRAVENOUS | Status: DC
  Administered 2013-06-26: 1 via INTRAVENOUS

## 2013-06-26 MED ORDER — CIPROFLOXACIN IN D5W 400 MG/200ML IV SOLN
400.0000 mg | INTRAVENOUS | Status: AC
  Administered 2013-06-26: 400 mg via INTRAVENOUS

## 2013-06-26 MED ORDER — MAGNESIUM HYDROXIDE 400 MG/5ML PO SUSP
30.0000 mL | Freq: Three times a day (TID) | ORAL | Status: AC
  Administered 2013-06-26 – 2013-06-27 (×3): 30 mL via ORAL
  Filled 2013-06-26 (×3): qty 30

## 2013-06-26 MED ORDER — ONDANSETRON HCL 4 MG/2ML IJ SOLN
INTRAMUSCULAR | Status: DC | PRN
  Administered 2013-06-26: 4 mg via INTRAVENOUS

## 2013-06-26 MED ORDER — BUPIVACAINE LIPOSOME 1.3 % IJ SUSP
20.0000 mL | Freq: Once | INTRAMUSCULAR | Status: DC
  Filled 2013-06-26: qty 20

## 2013-06-26 MED ORDER — CISATRACURIUM BESYLATE (PF) 10 MG/5ML IV SOLN
INTRAVENOUS | Status: DC | PRN
  Administered 2013-06-26: 10 mg via INTRAVENOUS
  Administered 2013-06-26 (×2): 2 mg via INTRAVENOUS

## 2013-06-26 MED ORDER — ENOXAPARIN SODIUM 40 MG/0.4ML ~~LOC~~ SOLN
40.0000 mg | SUBCUTANEOUS | Status: DC
  Administered 2013-06-27 – 2013-06-28 (×2): 40 mg via SUBCUTANEOUS
  Filled 2013-06-26 (×3): qty 0.4

## 2013-06-26 MED ORDER — ASPIRIN 81 MG PO CHEW
81.0000 mg | CHEWABLE_TABLET | Freq: Every morning | ORAL | Status: DC
Start: 2013-06-27 — End: 2013-06-28
  Administered 2013-06-27 – 2013-06-28 (×2): 81 mg via ORAL
  Filled 2013-06-26 (×2): qty 1

## 2013-06-26 MED ORDER — ACETAMINOPHEN 325 MG PO TABS
650.0000 mg | ORAL_TABLET | Freq: Four times a day (QID) | ORAL | Status: DC
  Administered 2013-06-26 – 2013-06-28 (×8): 650 mg via ORAL
  Filled 2013-06-26: qty 2
  Filled 2013-06-26: qty 1
  Filled 2013-06-26 (×7): qty 2
  Filled 2013-06-26: qty 1
  Filled 2013-06-26 (×3): qty 2

## 2013-06-26 MED ORDER — HYDROMORPHONE HCL PF 1 MG/ML IJ SOLN
0.5000 mg | INTRAMUSCULAR | Status: DC | PRN
  Administered 2013-06-27: 0.5 mg via INTRAVENOUS
  Filled 2013-06-26: qty 1

## 2013-06-26 MED ORDER — ONDANSETRON HCL 4 MG/2ML IJ SOLN: 4.0000 mg | Freq: Four times a day (QID) | INTRAMUSCULAR | Status: DC | PRN

## 2013-06-26 MED ORDER — FENTANYL CITRATE 0.05 MG/ML IJ SOLN
INTRAMUSCULAR | Status: DC | PRN
  Administered 2013-06-26 (×2): 50 ug via INTRAVENOUS
  Administered 2013-06-26: 100 ug via INTRAVENOUS
  Administered 2013-06-26: 50 ug via INTRAVENOUS

## 2013-06-26 MED ORDER — ENOXAPARIN SODIUM 40 MG/0.4ML ~~LOC~~ SOLN
40.0000 mg | SUBCUTANEOUS | Status: AC
  Administered 2013-06-26: 40 mg via SUBCUTANEOUS
  Filled 2013-06-26: qty 0.4

## 2013-06-26 MED ORDER — GABAPENTIN 100 MG PO CAPS
200.0000 mg | ORAL_CAPSULE | Freq: Every day | ORAL | Status: DC
  Administered 2013-06-26 – 2013-06-27 (×2): 200 mg via ORAL
  Filled 2013-06-26 (×3): qty 2

## 2013-06-26 MED ORDER — ONDANSETRON HCL 4 MG PO TABS: 4.0000 mg | ORAL_TABLET | Freq: Four times a day (QID) | ORAL | Status: DC | PRN

## 2013-06-26 MED ORDER — 0.9 % SODIUM CHLORIDE (POUR BTL) OPTIME
TOPICAL | Status: DC | PRN
  Administered 2013-06-26: 2000 mL

## 2013-06-26 MED ORDER — METRONIDAZOLE IN NACL 5-0.79 MG/ML-% IV SOLN
500.0000 mg | INTRAVENOUS | Status: AC
Start: 2013-06-26 — End: 2013-06-26
  Administered 2013-06-26: 500 mg via INTRAVENOUS

## 2013-06-26 MED ORDER — KETOROLAC TROMETHAMINE 30 MG/ML IJ SOLN
15.0000 mg | Freq: Four times a day (QID) | INTRAMUSCULAR | Status: DC
  Administered 2013-06-26 – 2013-06-27 (×5): 15 mg via INTRAVENOUS
  Filled 2013-06-26 (×7): qty 1

## 2013-06-26 MED ORDER — KCL IN DEXTROSE-NACL 20-5-0.45 MEQ/L-%-% IV SOLN
INTRAVENOUS | Status: DC
  Administered 2013-06-26: 14:00:00 via INTRAVENOUS
  Filled 2013-06-26 (×2): qty 1000

## 2013-06-26 MED ORDER — KETOROLAC TROMETHAMINE 30 MG/ML IJ SOLN
15.0000 mg | Freq: Four times a day (QID) | INTRAMUSCULAR | Status: DC
  Filled 2013-06-26 (×5): qty 1

## 2013-06-26 MED ORDER — INSULIN GLARGINE 100 UNIT/ML ~~LOC~~ SOLN
20.0000 [IU] | Freq: Every day | SUBCUTANEOUS | Status: DC
  Administered 2013-06-26 – 2013-06-27 (×2): 20 [IU] via SUBCUTANEOUS
  Filled 2013-06-26 (×2): qty 0.2

## 2013-06-26 MED ORDER — SODIUM CHLORIDE 0.9 % IJ SOLN
INTRAMUSCULAR | Status: DC | PRN
  Administered 2013-06-26: 09:00:00

## 2013-06-26 MED ORDER — PROMETHAZINE HCL 25 MG/ML IJ SOLN
6.2500 mg | INTRAMUSCULAR | Status: AC | PRN
  Administered 2013-06-26 (×2): 6.25 mg via INTRAVENOUS

## 2013-06-26 MED ORDER — PROPOFOL 10 MG/ML IV BOLUS
INTRAVENOUS | Status: DC | PRN
  Administered 2013-06-26: 150 mg via INTRAVENOUS

## 2013-06-26 MED ORDER — INSULIN ASPART 100 UNIT/ML ~~LOC~~ SOLN
0.0000 [IU] | SUBCUTANEOUS | Status: DC
  Administered 2013-06-26 (×3): 5 [IU] via SUBCUTANEOUS
  Administered 2013-06-27 (×2): 2 [IU] via SUBCUTANEOUS
  Administered 2013-06-27: 3 [IU] via SUBCUTANEOUS
  Administered 2013-06-27: 2 [IU] via SUBCUTANEOUS

## 2013-06-26 MED ORDER — LEVOTHYROXINE SODIUM 175 MCG PO TABS
175.0000 ug | ORAL_TABLET | Freq: Every day | ORAL | Status: DC
  Administered 2013-06-27 – 2013-06-28 (×2): 175 ug via ORAL
  Filled 2013-06-26 (×3): qty 1

## 2013-06-26 MED ORDER — GLYCOPYRROLATE 0.2 MG/ML IJ SOLN
INTRAMUSCULAR | Status: DC | PRN
  Administered 2013-06-26: 0.6 mg via INTRAVENOUS

## 2013-06-26 MED ORDER — NEOSTIGMINE METHYLSULFATE 1 MG/ML IJ SOLN
INTRAMUSCULAR | Status: DC | PRN
  Administered 2013-06-26: 4 mg via INTRAVENOUS

## 2013-06-26 MED ORDER — LOSARTAN POTASSIUM 50 MG PO TABS
50.0000 mg | ORAL_TABLET | Freq: Every day | ORAL | Status: DC
  Administered 2013-06-26 – 2013-06-27 (×2): 50 mg via ORAL
  Filled 2013-06-26 (×3): qty 1

## 2013-06-26 MED ORDER — ZOLPIDEM TARTRATE 5 MG PO TABS: 5.0000 mg | ORAL_TABLET | Freq: Every evening | ORAL | Status: DC | PRN

## 2013-06-26 MED ORDER — LACTATED RINGERS IV SOLN
INTRAVENOUS | Status: DC
  Administered 2013-06-26 (×2): via INTRAVENOUS

## 2013-06-26 SURGICAL SUPPLY — 43 items
ATTRACTOMAT 16X20 MAGNETIC DRP (DRAPES) ×3 IMPLANT
BAG URINE DRAINAGE (UROLOGICAL SUPPLIES) ×2 IMPLANT
BLADE EXTENDED COATED 6.5IN (ELECTRODE) ×3 IMPLANT
CANISTER SUCTION 2500CC (MISCELLANEOUS) ×3 IMPLANT
CHLORAPREP W/TINT 26ML (MISCELLANEOUS) ×3 IMPLANT
CLIP TI MEDIUM LARGE 6 (CLIP) ×12 IMPLANT
CLOTH BEACON ORANGE TIMEOUT ST (SAFETY) ×3 IMPLANT
COVER SURGICAL LIGHT HANDLE (MISCELLANEOUS) ×3 IMPLANT
DECANTER SPIKE VIAL GLASS SM (MISCELLANEOUS) ×1 IMPLANT
DRAPE UTILITY XL STRL (DRAPES) ×3 IMPLANT
DRAPE WARM FLUID 44X44 (DRAPE) ×3 IMPLANT
DRSG TELFA 4X14 ISLAND ADH (GAUZE/BANDAGES/DRESSINGS) ×1 IMPLANT
ELECT REM PT RETURN 9FT ADLT (ELECTROSURGICAL) ×3
ELECTRODE REM PT RTRN 9FT ADLT (ELECTROSURGICAL) ×2 IMPLANT
GAUZE SPONGE 4X4 16PLY XRAY LF (GAUZE/BANDAGES/DRESSINGS) ×2 IMPLANT
GLOVE BIO SURGEON STRL SZ 6.5 (GLOVE) ×3 IMPLANT
GLOVE BIOGEL M STRL SZ7.5 (GLOVE) ×12 IMPLANT
GOWN PREVENTION PLUS LG XLONG (DISPOSABLE) ×2 IMPLANT
GOWN STRL NON-REIN LRG LVL3 (GOWN DISPOSABLE) ×2 IMPLANT
GOWN STRL REIN XL XLG (GOWN DISPOSABLE) ×3 IMPLANT
LIGASURE IMPACT 36 18CM CVD LR (INSTRUMENTS) IMPLANT
NS IRRIG 1000ML POUR BTL (IV SOLUTION) ×8 IMPLANT
PACK ABDOMINAL WL (CUSTOM PROCEDURE TRAY) ×3 IMPLANT
SHEET LAVH (DRAPES) ×3 IMPLANT
SPONGE LAP 18X18 X RAY DECT (DISPOSABLE) ×5 IMPLANT
STAPLER VISISTAT 35W (STAPLE) ×3 IMPLANT
SUT ETHILON 1 LR 30 (SUTURE) IMPLANT
SUT PDS AB 1 CTXB1 36 (SUTURE) ×6 IMPLANT
SUT SILK 2 0 (SUTURE)
SUT SILK 2 0 30  PSL (SUTURE)
SUT SILK 2 0 30 PSL (SUTURE) IMPLANT
SUT SILK 2-0 18XBRD TIE 12 (SUTURE) ×2 IMPLANT
SUT VIC AB 0 CT1 36 (SUTURE) ×12 IMPLANT
SUT VIC AB 2-0 CT2 27 (SUTURE) ×22 IMPLANT
SUT VIC AB 2-0 SH 27 (SUTURE) ×9
SUT VIC AB 2-0 SH 27X BRD (SUTURE) ×12 IMPLANT
SUT VIC AB 3-0 CTX 36 (SUTURE) ×1 IMPLANT
SUT VICRYL 2 0 18  UND BR (SUTURE) ×1
SUT VICRYL 2 0 18 UND BR (SUTURE) ×2 IMPLANT
TOWEL OR 17X26 10 PK STRL BLUE (TOWEL DISPOSABLE) ×3 IMPLANT
TOWEL OR NON WOVEN STRL DISP B (DISPOSABLE) ×3 IMPLANT
TRAY FOLEY CATH 14FRSI W/METER (CATHETERS) ×3 IMPLANT
WATER STERILE IRR 1500ML POUR (IV SOLUTION) ×2 IMPLANT

## 2013-06-26 NOTE — Anesthesia Postprocedure Evaluation (Signed)
Anesthesia Post Note  Patient: Debra Campbell  Procedure(s) Performed: Procedure(s) (LRB): EXPLORATORY LAPAROTOMY (N/A) TOTAL ABDOMINAL HYSTERECTOMY WITH BILATERAL SALPINGO OOPHERECTOMY WITH PELVIC LYMPHADNECTOMY (Bilateral)  Anesthesia type: General  Patient location: PACU  Post pain: Pain level controlled  Post assessment: Post-op Vital signs reviewed  Last Vitals:  Filed Vitals:   06/26/13 1130  BP: 129/55  Pulse: 52  Temp:   Resp: 17    Post vital signs: Reviewed  Level of consciousness: sedated  Complications: No apparent anesthesia complications

## 2013-06-26 NOTE — H&P (Signed)
Assessment : Grade 1 endometrial carcinoma.  Plan:  The patient is admitted today for total.striking bilateral salpingo-oophorectomy and possible lymphadenectomy for treatment of grade 1 endometrial carcinoma. The risks of surgery been previously discussed in detail all questions are answered. HPI: Patient is seen in consultation request of Dr. Debroah Loop regarding management of a newly diagnosed endometrial carcinoma. The patient had some abnormal bleeding approximately 3 months ago. She underwent a D&C on 04/27/2013 revealing a grade 1 endometrial carcinoma in a background of atypical complex hyperplasia.  Patient denies any constitutional symptoms. She's not having any pelvic pain. Currently she is not having any bleeding. Ultrasound exam shows a uterus measures 7.9 x 4.5 x 7.3 cm.  She has a past history of a laparotomy for an ectopic pregnancy. She's also had a total hip replacement.  Review of Systems:10 point review of systems is negative except as noted in interval history.  Vitals: Blood pressure 134/70, pulse 68, temperature 97.7 F (36.5 C), temperature source Oral, resp. rate 16, height 5' 4.41" (1.636 m), weight 97.387 kg (214 lb 11.2 oz).  Physical Exam:  General : The patient is a healthy woman in no acute distress.  HEENT: normocephalic, extraoccular movements normal; neck is supple without thyromegally  Lynphnodes: Supraclavicular and inguinal nodes not enlarged  Abdomen: Soft, non-tender, no ascites, no organomegally, no masses, small umbilical hernia.  Pelvic:  EGBUS: Normal female  Vagina: Normal, no lesions  Urethra and Bladder: Normal, non-tender  Cervix: Normal  Uterus: Normal shape size and consistency  Bi-manual examination: Non-tender; no adenxal masses or nodularity  Rectal: normal sphincter tone, no masses, no blood  Lower extremities: No edema or varicosities. Normal range of motion  Allergies   Allergen  Reactions   .  Ace Inhibitors  Cough   .  Iohexol      Desc:  HIVES 40 YEARS AGO   .  Statins      myalgia   .  Iodine  Rash   .  Penicillins  Rash   .  Sulfa Antibiotics  Rash    Past Medical History   Diagnosis  Date   .  Diabetes mellitus    .  Thyroid disease    .  Hypertension    .  SVD (spontaneous vaginal delivery)      x 3   .  Hypothyroidism    .  Eczema    .  Hyperlipidemia      no meds   .  Kidney stones      no surgery required   .  Arthritis      legs, lower back, hands    Past Surgical History   Procedure  Laterality  Date   .  Lt knee arthroscopy     .  Rt total hip replacement     .  Ectopic pregnancy surgery     .  Laparotomy       for ectopic pregnancy   .  Tonsillectomy     .  Wisdom tooth extraction     .  Joint replacement     .  Hysteroscopy w/d&c  N/A  04/26/2013     Procedure: DILATATION AND CURETTAGE /HYSTEROSCOPY; Surgeon: Adam Phenix, MD; Location: [facility]; Service: Gynecology; Laterality: N/A;    Current Outpatient Prescriptions   Medication  Sig  Dispense  Refill   .  aspirin 81 MG tablet  Take 81 mg by mouth daily.     .  diclofenac (VOLTAREN) 75  MG EC tablet  Take 75 mg by mouth at bedtime.     [name]  ezetimibe (ZETIA) 10 MG tablet  Take 1 tablet (10 mg total) by mouth daily.  90 tablet  1   .  gabapentin (NEURONTIN) 100 MG capsule  Take 1 capsule (100 mg total) by mouth at bedtime.  90 capsule  1   .  hydrochlorothiazide (HYDRODIURIL) 25 MG tablet  Take 1 tablet (25 mg total) by mouth daily.  90 tablet  1   .  HYDROcodone-acetaminophen (NORCO/VICODIN) 5-325 MG per tablet  Take 1 tablet by mouth every 6 (six) hours as needed for pain.  15 tablet  0   .  levothyroxine (SYNTHROID, LEVOTHROID) 175 MCG tablet  Take 1 tablet (175 mcg total) by mouth daily before breakfast.  90 tablet  1   .  losartan (COZAAR) 50 MG tablet  Take 1 tablet (50 mg total) by mouth daily.  90 tablet  1   .  metFORMIN (GLUCOPHAGE) 500 MG tablet  Take 1 tablet (500 mg total) by mouth 2 (two) times daily with a meal.  180 tablet  1    .  misoprostol (CYTOTEC) 200 MCG tablet  One tablet per vagina hs prior to next appointment  1 tablet  1   .  Triamcinolone Acetonide (TRIAMCINOLONE 0.1 % CREAM : EUCERIN) CREA  Apply 1 application topically 2 (two) times daily.  60 each  2   .  [DISCONTINUED] Pitavastatin Calcium (LIVALO) 1 MG TABS  Take 1 tablet by mouth daily.      No current facility-administered medications for this visit.    History    Social History   .  Marital Status:  Married     Spouse Name:  N/A     Number of Children:  N/A   .  Years of Education:  N/A    Occupational History   .  Not on file.    Social History Main Topics   .  Smoking status:  Never Smoker   .  Smokeless tobacco:  Never Used   .  Alcohol Use:  No   .  Drug Use:  No   .  Sexually Active:  No    Other Topics  Concern   .  Not on file    Social History Narrative   .  No narrative on file    Family History   Problem  Relation  Age of Onset   .  Heart disease  Father    .  Stroke  Father    .  Diabetes  Father    .  Breast cancer  Daughter    Jeannette Corpus, MD

## 2013-06-26 NOTE — Anesthesia Preprocedure Evaluation (Signed)
Anesthesia Evaluation  Patient identified by MRN, date of birth, ID band Patient awake    Reviewed: Allergy & Precautions, H&P , NPO status , Patient's Chart, lab work & pertinent test results  Airway Mallampati: II TM Distance: >3 FB Neck ROM: Full    Dental  (+) Teeth Intact, Dental Advisory Given and Chipped,    Pulmonary neg pulmonary ROS,  breath sounds clear to auscultation  Pulmonary exam normal       Cardiovascular hypertension, Pt. on medications Rhythm:Regular Rate:Normal     Neuro/Psych  Headaches, negative psych ROS   GI/Hepatic negative GI ROS, Neg liver ROS,   Endo/Other  diabetes, Type 2, Oral Hypoglycemic AgentsHypothyroidism Morbid obesity  Renal/GU negative Renal ROS  negative genitourinary   Musculoskeletal negative musculoskeletal ROS (+)   Abdominal   Peds  Hematology negative hematology ROS (+)   Anesthesia Other Findings   Reproductive/Obstetrics negative OB ROS                           Anesthesia Physical Anesthesia Plan  ASA: II  Anesthesia Plan: General   Post-op Pain Management:    Induction: Intravenous  Airway Management Planned: Oral ETT  Additional Equipment:   Intra-op Plan:   Post-operative Plan: Extubation in OR  Informed Consent: I have reviewed the patients History and Physical, chart, labs and discussed the procedure including the risks, benefits and alternatives for the proposed anesthesia with the patient or authorized representative who has indicated his/her understanding and acceptance.   Dental advisory given  Plan Discussed with: CRNA  Anesthesia Plan Comments:         Anesthesia Quick Evaluation

## 2013-06-26 NOTE — Op Note (Signed)
SKIE VITRANO  female MEDICAL RECORD [id number] DATE OF BIRTH: 1932/08/30 PHYSICIAN: De Blanch, M.D  06/26/2013   OPERATIVE REPORT  PREOPERATIVE DIAGNOSIS: BROOKLIN RIEGER  female MEDICAL RECORD [id number] DATE OF BIRTH: Nov 29, 1932 PHYSICIAN: De Blanch, M.D  06/26/2013   OPERATIVE REPORT  PREOPERATIVE DIAGNOSIS: Grade 1 endometrial carcinoma  POSTOPERATIVE DIAGNOSIS: Same  PROCEDURE: Total abdominal hysterectomy, bilateral salpingo-oophorectomy, pelvic lymphadenectomy, lysis of adhesions  SURGEON: De Blanch, M.D ASSISTANT: Antionette Char M.D. ANESTHESIA: Gen. with oral tracheal tube ESTIMATED BLOOD LOSS: 200 mL  SURGICAL FINDINGS: At exploratory laparotomy the patient had adhesions of the sigmoid colon and omentum to prior left paramedian incision. The uterus was slightly enlarged and heart-shaped. The right tube was absent (prior ectopic pregnancy) the ovaries appeared normal. Because of adhesions the upper abdomen could not be explored. Pelvic lymph nodes appeared grossly normal.  PROCEDURE: The patient was brought to the operating room and after satisfactory attainment of general anesthesia was placed in a modified lithotomy position in Petros stirrups. The anterior abdominal wall, perineum and vagina were prepped, Foley catheter was inserted, the patient was draped. A time-out was taken, SCDs were in place, prophylactic antibiotics were administered.  The abdomen was entered through a vertical incision. The umbilical hernia was excised as part of the incision. Peritoneal washings were taken and sent to cytopathology.   Bookwalter retractor was assembled and  the small bowel and sigmoid colon were elevated out of the pelvis thus exposing the uterus. Adhesions of the omentum the sigmoid colon and the lower abdomen and pelvis were lysed to gain better exposure. The uterus was grasped with two long Kelly clamps.  The right round ligament was  divided the retroperitoneal spaces opened. The iliac vessels and ureter were identified. The ovarian vessels were skeletonized clamped cut free tied and suture ligated. Similar procedures performed left side the pelvis. The bladder flap was advanced to sharp and blunt dissection. Uterine vessels were skeletonized then clamped cut and suture ligated. Vaginal angles were encountered, crossclamped and the vagina transected from its connection to the cervix. The uterus, cervix,tubes and ovaries were handed off the operative field. The vaginal angles were transfixed with 0 Vicryl the central portion of vagina closed with interrupted figure-of-eight sutures of 0 Vicryl. The pelvis was irrigated and hemostasis was ascertained.  Frozen section returned inconclusive for depth of invasion. We therefore proceeded to perform a pelvic lymphadenectomy. Lymph nodes overlying the external iliac artery and vein internal iliac artery and obturator fossa were excised. Care was taken to avoid injury to the vessels and the obturator nerve. Throughout the dissection the ureter was held medially and protected. Similar procedure was performed on both sides the pelvis.  The retractor was taken down   The abdomen and pelvis were irrigated.  The anterior abdominal wall was closed in layers the first being a running mass closure using #1 PDS. Subcutaneous tissue was irrigated and hemostasis achieved with cautery. Experal (266 mg diluted in 40 ml saline)was injected into the subcutaneous layer. Interrupted 3-0 Vicryl sutures were used to reapproximate the subcutaneous layer. Skin was closed with skin staples. A dressing was applied.  Patient was awakened from anesthesia and taken to the recovery room in satisfactory condition. Sponge needle and instrument counts correct times two.   De Blanch, M.D  POSTOPERATIVE DIAGNOSIS:  PROCEDURE:   SURGEON: De Blanch, M.D ASSISTANT: Antionette Char  M.D. ANESTHESIA:  ESTIMATED BLOOD LOSS:  SURGICAL FINDINGS:  PROCEDURE: The patient was brought to the operating room  and after satisfactory attainment of general anesthesia was placed in a modified lithotomy position in Milburn stirrups. The anterior abdominal wall, perineum and vagina were prepped, Foley catheter was inserted, the patient was draped. A time-out was taken, SCDs were in place, prophylactic antibiotics were administered.  The abdomen was entered through a vertical incision. Peritoneal washings were taken and sent to cytopathology.   Bookwalter retractor was assembled and  the small bowel and sigmoid colon were elevated out of the pelvis thus exposing the uterus. The uterus was grasped with two long Kelly clamps.  The right round ligament was divided the retroperitoneal spaces opened. The iliac vessels and ureter were identified. The ovarian vessels were skeletonized clamped cut free tied and suture ligated. Similar procedures performed left side the pelvis. The bladder flap was advanced to sharp and blunt dissection. Uterine vessels were skeletonized then clamped cut and suture ligated. Vaginal angles were encountered, crossclamped and the vagina transected from its connection to the cervix. The uterus, cervix,tubes and ovaries were handed off the operative field. The vaginal angles were transfixed with 0 Vicryl the central portion of vagina closed with interrupted figure-of-eight sutures of 0 Vicryl. The pelvis was irrigated and hemostasis was ascertained.  The retractor was taken down   The abdomen and pelvis were irrigated.  The anterior abdominal wall was closed in layers the first being a running mass closure using #1 PDS. Subcutaneous tissue was irrigated and hemostasis achieved with cautery. Experal (266 mg diluted in 40 ml saline)was injected into the subcutaneous layer. Skin was closed with skin staples. A dressing was applied.  Patient was awakened from anesthesia and taken to  the recovery room in satisfactory condition. Sponge needle and instrument counts correct times two.   De Blanch, M.D

## 2013-06-26 NOTE — Progress Notes (Signed)
Utilization review completed.  

## 2013-06-26 NOTE — Transfer of Care (Signed)
Immediate Anesthesia Transfer of Care Note  Patient: Debra Campbell  Procedure(s) Performed: Procedure(s): EXPLORATORY LAPAROTOMY (N/A) TOTAL ABDOMINAL HYSTERECTOMY WITH BILATERAL SALPINGO OOPHERECTOMY WITH PELVIC LYMPHADNECTOMY (Bilateral)  Patient Location: PACU  Anesthesia Type:General  Level of Consciousness: awake, sedated and patient cooperative  Airway & Oxygen Therapy: Patient Spontanous Breathing and Patient connected to face mask oxygen  Post-op Assessment: Report given to PACU RN and Post -op Vital signs reviewed and stable  Post vital signs: Reviewed and stable  Complications: No apparent anesthesia complications

## 2013-06-26 NOTE — Interval H&P Note (Signed)
History and Physical Interval Note:  06/26/2013 8:08 AM  Debra Campbell  has presented today for surgery, with the diagnosis of Endometrial Cancer  The various methods of treatment have been discussed with the patient and family. After consideration of risks, benefits and other options for treatment, the patient has consented to  Procedure(s): EXPLORATORY LAPAROTOMY (N/A) TOTAL ABDOMINAL HYSTERECTOMY WITH BILATERAL SALPINGO OOPHERECTOMY WITH POSSIBLE LYMPHADNECTOMY (Bilateral) as a surgical intervention .  The patient's history has been reviewed, patient examined, no change in status, stable for surgery.  I have reviewed the patient's chart and labs.  Questions were answered to the patient's satisfaction.     CLARKE-PEARSON,Rishard Delange L

## 2013-06-27 ENCOUNTER — Encounter (HOSPITAL_COMMUNITY): Payer: Self-pay | Admitting: Gynecology

## 2013-06-27 ENCOUNTER — Ambulatory Visit: Payer: Medicare Other | Admitting: Nurse Practitioner

## 2013-06-27 LAB — BASIC METABOLIC PANEL
BUN: 14 mg/dL (ref 6–23)
CO2: 29 mEq/L (ref 19–32)
Calcium: 8.6 mg/dL (ref 8.4–10.5)
Glucose, Bld: 144 mg/dL — ABNORMAL HIGH (ref 70–99)
Potassium: 3.5 mEq/L (ref 3.5–5.1)
Sodium: 136 mEq/L (ref 135–145)

## 2013-06-27 LAB — CBC
Hemoglobin: 11.6 g/dL — ABNORMAL LOW (ref 12.0–15.0)
MCH: 30.4 pg (ref 26.0–34.0)
MCV: 91.3 fL (ref 78.0–100.0)
RBC: 3.81 MIL/uL — ABNORMAL LOW (ref 3.87–5.11)

## 2013-06-27 LAB — GLUCOSE, CAPILLARY
Glucose-Capillary: 136 mg/dL — ABNORMAL HIGH (ref 70–99)
Glucose-Capillary: 170 mg/dL — ABNORMAL HIGH (ref 70–99)
Glucose-Capillary: 130 mg/dL — ABNORMAL HIGH (ref 70–99)
Glucose-Capillary: 222 mg/dL — ABNORMAL HIGH (ref 70–99)

## 2013-06-27 MED ORDER — IBUPROFEN 600 MG PO TABS
600.0000 mg | ORAL_TABLET | Freq: Four times a day (QID) | ORAL | Status: DC
  Administered 2013-06-27 – 2013-06-28 (×3): 600 mg via ORAL
  Filled 2013-06-27 (×6): qty 1

## 2013-06-27 MED ORDER — METFORMIN HCL 500 MG PO TABS
500.0000 mg | ORAL_TABLET | Freq: Two times a day (BID) | ORAL | Status: DC
  Administered 2013-06-27 – 2013-06-28 (×2): 500 mg via ORAL
  Filled 2013-06-27 (×4): qty 1

## 2013-06-27 NOTE — Progress Notes (Signed)
1 Day Post-Op Procedure(s) (LRB): EXPLORATORY LAPAROTOMY (N/A) TOTAL ABDOMINAL HYSTERECTOMY WITH BILATERAL SALPINGO OOPHERECTOMY WITH PELVIC LYMPHADNECTOMY (Bilateral)  Subjective: Patient reports minimal pain with no nausea or emesis.  Ambulating without difficulty.  Tolerating solid diet.  Denies chest pain, dyspnea, passing flatus, or having a bowel movement.  No concerns voiced.   Objective: Vital signs in last 24 hours: Temp:  [97.5 F (36.4 C)-98.1 F (36.7 C)] 97.5 F (36.4 C) (07/30 0634) Pulse Rate:  [52-71] 68 (07/30 0634) Resp:  [14-20] 15 (07/30 0634) BP: (113-158)/(55-91) 129/72 mmHg (07/30 0634) SpO2:  [95 %-100 %] 97 % (07/30 0634) Weight:  [217 lb (98.431 kg)] 217 lb (98.431 kg) (07/29 1251) Last BM Date: 06/26/13  Intake/Output from previous day: 07/29 0701 - 07/30 0700 In: 3385 [I.V.:3305] Out: 850 [Urine:750; Blood:100]  Physical Examination: General: alert, cooperative and no distress Resp: mildly diminished in the bases, no crackles or wheezing Cardio: regular rate and rhythm, S1, S2 normal, no murmur, click, rub or gallop GI: soft, non-tender; bowel sounds normal; no masses,  no organomegaly and incision: midline incision dressing removed, incision with staples clean, dry, and intact Extremities: extremities normal, atraumatic, no cyanosis or edema  Labs: WBC/Hgb/Hct/Plts:  6.9/11.6/34.8/163 (07/30 1610) BUN/Cr/glu/ALT/AST/amyl/lip:  14/0.97/--/--/--/--/-- (07/30 9604)  Assessment: 79 y.o. s/p Procedure(s): EXPLORATORY LAPAROTOMY TOTAL ABDOMINAL HYSTERECTOMY WITH BILATERAL SALPINGO OOPHERECTOMY WITH PELVIC LYMPHADNECTOMY: stable Pain:  Pain is well-controlled on PRN medications.  Heme:  Stable post-operatively.  Hgb 11.6 and Hct 34.8 this am.  CV:  BP and HR stable post-operatively.  Current treatment:  Cozaar.  GI:  Tolerating po: Yes     FEN:  Stable post-operatively.  Endo:  Diabetes.  Metformin restarted.  Prophylaxis: intermittent  pneumatic compression boots.  Plan: Saline lock IV Diet as tolerated Encourage ambulation, IS use, deep breathing, and coughing Continue post-operative plan of care.   LOS: 1 day    Fenna Semel DEAL 06/27/2013, 10:08 AM

## 2013-06-27 NOTE — Care Management Note (Signed)
    Page 1 of 1   06/27/2013     10:05:29 AM   CARE MANAGEMENT NOTE 06/27/2013  Patient:  Debra Campbell, Debra Campbell   Account Number:  [id number]  Date Initiated:  06/27/2013  Documentation initiated by:  Lorenda Ishihara  Subjective/Objective Assessment:   79 yo female admitted s/p TAH, BSO. PTA lived at home with spouse.     Action/Plan:   Home when stable   Anticipated DC Date:  06/30/2013   Anticipated DC Plan:  HOME/SELF CARE      DC Planning Services  CM consult      Choice offered to / List presented to:             Status of service:  Completed, signed off Medicare Important Message given?   (If response is "NO", the following Medicare IM given date fields will be blank) Date Medicare IM given:   Date Additional Medicare IM given:    Discharge Disposition:  HOME/SELF CARE  Per UR Regulation:  Reviewed for med. necessity/level of care/duration of stay  If discussed at Long Length of Stay Meetings, dates discussed:    Comments:

## 2013-06-28 LAB — GLUCOSE, CAPILLARY

## 2013-06-28 MED ORDER — ENOXAPARIN SODIUM 40 MG/0.4ML ~~LOC~~ SOLN: 40.0000 mg | SUBCUTANEOUS | Status: DC

## 2013-06-28 MED ORDER — ENOXAPARIN (LOVENOX) PATIENT EDUCATION KIT
PACK | Freq: Once | Status: AC
  Administered 2013-06-28: 11:00:00
  Filled 2013-06-28: qty 1

## 2013-06-28 MED ORDER — OXYCODONE HCL 5 MG PO TABS: 5.0000 mg | ORAL_TABLET | ORAL | Status: DC | PRN

## 2013-06-28 NOTE — Discharge Summary (Signed)
Physician Discharge Summary  Patient ID: Debra Campbell MRN: [id number] DOB/AGE: 77-Mar-1933 77 y.o.  Admit date: 06/26/2013 Discharge date: 06/28/2013  Admission Diagnoses: Endometrial cancer  Discharge Diagnoses:  Principal Problem:   Endometrial cancer  Discharged Condition:  The patient is in good condition and stable for discharge.   Hospital Course: On 06/26/2013, the patient underwent the following: Procedure(s):  EXPLORATORY LAPAROTOMY TOTAL ABDOMINAL HYSTERECTOMY WITH BILATERAL SALPINGOOOPHERECTOMY WITH PELVIC LYMPHADNECTOMY.  The postoperative course was uneventful.  She was discharged to home on postoperative day 2 tolerating a regular diet, having bowel movements, and passing flatus.  Consults: None  Significant Diagnostic Studies: None  Treatments: Surgery: see above  Discharge Exam: Blood pressure 114/50, pulse 65, temperature 97.6 F (36.4 C), temperature source Oral, resp. rate 16, height 5\' 4"  (1.626 m), weight 217 lb (98.431 kg), SpO2 96.00%. General appearance: alert, cooperative and no distress Resp: clear to auscultation bilaterally Cardio: regular rate and rhythm, S1, S2 normal, no murmur, click, rub or gallop GI: soft, non-tender; bowel sounds normal; no masses,  no organomegaly Extremities: extremities normal, atraumatic, no cyanosis or edema Incision/Wound: Midline incision with staples open to air, incision clean, dry, and intact  Disposition: 01-Home or Self Care  Discharge Orders   Future Appointments Provider Department Dept Phone   07/03/2013 1:00 PM Doylene Bode, NP [facility] [phone]   Future Orders Complete By Expires     Call MD for:  difficulty breathing, headache or visual disturbances  As directed     Call MD for:  extreme fatigue  As directed     Call MD for:  hives  As directed     Call MD for:  persistant dizziness or light-headedness  As directed     Call MD for:  persistant nausea and  vomiting  As directed     Call MD for:  redness, tenderness, or signs of infection (pain, swelling, redness, odor or green/yellow discharge around incision site)  As directed     Call MD for:  severe uncontrolled pain  As directed     Call MD for:  temperature >100.4  As directed     Diet - low sodium heart healthy  As directed     Driving Restrictions  As directed     Comments:      No driving for 2 weeks.  Do not take narcotics and drive.    Increase activity slowly  As directed     Lifting restrictions  As directed     Comments:      No lifting greater than 10 lbs.    Sexual Activity Restrictions  As directed     Comments:      No sexual activity, nothing in the vagina, for 6 weeks.        Medication List         acetaminophen 500 MG tablet  Commonly known as:  TYLENOL  Take 500-1,000 mg by mouth every 6 (six) hours as needed for pain.     aspirin 81 MG tablet  Take 81 mg by mouth every morning.     diclofenac 75 MG EC tablet  Commonly known as:  VOLTAREN  Take 75 mg by mouth at bedtime.     enoxaparin 40 MG/0.4ML injection  Commonly known as:  LOVENOX  Inject 0.4 mLs (40 mg total) into the skin daily.     gabapentin 100 MG capsule  Commonly known as:  NEURONTIN  Take 200 mg  by mouth at bedtime.     hydrochlorothiazide 25 MG tablet  Commonly known as:  HYDRODIURIL  Take 25 mg by mouth every morning.     levothyroxine 175 MCG tablet  Commonly known as:  SYNTHROID, LEVOTHROID  Take 175 mcg by mouth daily before breakfast.     losartan 50 MG tablet  Commonly known as:  COZAAR  Take 50 mg by mouth at bedtime.     metFORMIN 500 MG tablet  Commonly known as:  GLUCOPHAGE  Take 500 mg by mouth 2 (two) times daily with a meal.     multivitamin with minerals Tabs  Take 1 tablet by mouth daily.     ONETOUCH DELICA LANCETS 33G Misc  TEST ONCE A DAY AND AS NEEDED     oxyCODONE 5 MG immediate release tablet  Commonly known as:  Oxy IR/ROXICODONE  Take 1 tablet (5  mg total) by mouth every 4 (four) hours as needed (moderate to severe pain).     PRESCRIPTION MEDICATION  Apply 1 application topically 2 (two) times daily as needed (to itchy skin). Triamcinolone 0.1% / eucerine cream           Follow-up Information   Follow up with Shawnte Winton DEAL, NP On 07/03/2013. (at 1pm at the [facility].)    Contact information:   [address] [place] [address] [phone]       Please follow up. (We will call you with your final path report to schedule a follow up appointment with Dr. Stanford Breed)       Greater than thirty minutes were spend for face to face discharge instructions and discharge orders/summary in EPIC.   Signed: Nakyiah Kuck DEAL 06/28/2013, 10:10 AM

## 2013-06-28 NOTE — Progress Notes (Signed)
Assessment unchanged. Up ad lib in room and hall this am with no complaints of pain. Verbalized understanding of dc instructions through teach back. Lovenox kit provided earlier, pt read through booklet and technique for administering injectable with verbalized understanding. Able to tell nurse how to administer yet stated " I have a niece that is a nurse that can do it for me. " Script x 1 given as provided by MD. Appointment to see Warner Mccreedy, NP next week on AVS.  My Chart introduced and discussed yet pt reported not having a computer. Discharged via wc to front entrance to meet awaiting vehicle to carry home. Accompanied by husband and NT x 2.

## 2013-06-29 ENCOUNTER — Telehealth: Payer: Self-pay | Admitting: Gynecologic Oncology

## 2013-06-29 NOTE — Telephone Encounter (Signed)
Post op telephone call to check patient status.  Patient describes expected post operative status.  Adequate PO intake reported.  Bowels and bladder functioning without difficulty.  Pain minimal.  Reportable signs and symptoms reviewed.  Follow up appt arranged for August 5 for staple removal and for August 22 with Dr. Stanford Breed.  Instructed to call for any questions or concerns.

## 2013-06-29 NOTE — Plan of Care (Signed)
Problem: Consults Goal: Diagnosis- Total Joint Replacement Revision Total Knee     

## 2013-07-03 ENCOUNTER — Encounter: Payer: Self-pay | Admitting: Gynecologic Oncology

## 2013-07-03 ENCOUNTER — Ambulatory Visit: Payer: Medicare Other | Attending: Gynecologic Oncology | Admitting: Gynecologic Oncology

## 2013-07-03 VITALS — BP 144/90 | HR 80 | Temp 98.5°F | Resp 18 | Ht 64.41 in | Wt 213.7 lb

## 2013-07-03 DIAGNOSIS — C541 Malignant neoplasm of endometrium: Secondary | ICD-10-CM

## 2013-07-03 NOTE — Patient Instructions (Addendum)
Doing well.  Plan to follow up as scheduled.  Please call for any questions or concerns.

## 2013-07-04 ENCOUNTER — Encounter: Payer: Self-pay | Admitting: Gynecologic Oncology

## 2013-07-04 NOTE — Progress Notes (Signed)
Follow Up Note: Gyn-Onc  Debra Campbell 79 y.o. female  CC:  Chief Complaint  Patient presents with  . Endometrial cancer    follow up(staple removal)    HPI: Debra Campbell is a 79 year old female initially referred by Dr. Debroah Loop regarding management of a newly diagnosed endometrial carcinoma. The patient had some abnormal bleeding approximately 3 months ago. She underwent a D&C on 04/27/2013 revealing a grade 1 endometrial carcinoma in a background of atypical complex hyperplasia.  Ultrasound exam shows a uterus measures 7.9 x 4.5 x 7.3 cm.  On 06/26/13, she underwent a total abdominal hysterectomy, bilateral salpingo-oophorectomy, pelvic lymphadenectomy, and lysis of adhesions by Dr. Stanford Breed.  Final pathology revealed an invasive grade 1 endometrioid adenocarcinoma, spanning 5 cm in greatest dimension.  The tumor invaded into the outer half of the myometrium and did not involve the cervix.  Additional findings included adenomyosis, benign cervix, benign right ovary, benign left fallopian tube and ovary.  Three benign lymph nodes with no tumor seen from the left pelvis and six benign lymph nodes with no tumor seen from the right pelvis.  Her post-operative course was uneventful.   Interval History:  She presents today for staple removal and post-op follow up.  No concerns voiced since surgery.  She reports doing well at home with family there to assist her.  She describes the expected post operative status.  Adequate PO intake reported.  Bowels and bladder functioning without difficulty.  Pain minimal.    Review of Systems  Constitutional: Feels well.  No fever, chills, change in appetite, unintentional weight loss or gain.  Cardiovascular: No chest pain, shortness of breath, or edema.  Pulmonary: No cough or wheeze.  Gastrointestinal: No nausea, vomiting, or diarrhea. No bright red blood per rectum or change  in bowel movement.  Genitourinary: No frequency, urgency, or dysuria. No vaginal bleeding or discharge.  Musculoskeletal: No myalgia or joint pain. Neurologic: No weakness, numbness, or change in gait.  Psychology: No depression, anxiety, or insomnia.  Current Meds:  Outpatient Encounter Prescriptions as of 07/03/2013  Medication Sig Dispense Refill  . acetaminophen (TYLENOL) 500 MG tablet Take 500-1,000 mg by mouth every 6 (six) hours as needed for pain.      [name] enoxaparin (LOVENOX) 40 MG/0.4ML injection Inject 0.4 mLs (40 mg total) into the skin daily.  26 Syringe  0  . gabapentin (NEURONTIN) 100 MG capsule Take 200 mg by mouth at bedtime.      . hydrochlorothiazide (HYDRODIURIL) 25 MG tablet Take 25 mg by mouth every morning.      [name] levothyroxine (SYNTHROID, LEVOTHROID) 175 MCG tablet Take 175 mcg by mouth daily before breakfast.      . metFORMIN (GLUCOPHAGE) 500 MG tablet Take 500 mg by mouth 2 (two) times daily with a meal.      . Multiple Vitamin (MULTIVITAMIN WITH MINERALS) TABS Take 1 tablet by mouth daily.      [name] DELICA LANCETS 33G MISC TEST ONCE A DAY AND AS NEEDED  100 each  0  . PRESCRIPTION MEDICATION Apply 1 application topically 2 (two) times daily as needed (to itchy skin). Triamcinolone 0.1% / eucerine cream      . aspirin 81 MG tablet Take 81 mg by mouth every morning.       . diclofenac (VOLTAREN) 75 MG EC tablet Take 75 mg by mouth at bedtime.      [name] losartan (COZAAR) 50 MG tablet Take 50 mg by mouth at bedtime.      [name]  oxyCODONE (OXY IR/ROXICODONE) 5 MG immediate release tablet Take 1 tablet (5 mg total) by mouth every 4 (four) hours as needed (moderate to severe pain).  20 tablet  0   No facility-administered encounter medications on file as of 07/03/2013.    Allergy:  Allergies  Allergen Reactions  . Ace Inhibitors Cough  . Iohexol      Desc: HIVES 40 YEARS AGO   . Statins     myalgia  . Iodine Rash  . Penicillins Rash  . Sulfa Antibiotics Rash     Social Hx:   History   Social History  . Marital Status: Married    Spouse Name: N/A    Number of Children: N/A  . Years of Education: N/A   Occupational History  . Not on file.   Social History Main Topics  . Smoking status: Never Smoker   . Smokeless tobacco: Never Used  . Alcohol Use: No  . Drug Use: No  . Sexually Active: No   Other Topics Concern  . Not on file   Social History Narrative  . No narrative on file    Past Surgical Hx:  Past Surgical History  Procedure Laterality Date  . Lt knee arthroscopy    . Rt total hip replacement    . Ectopic pregnancy surgery    . Laparotomy      for ectopic pregnancy  . Tonsillectomy    . Wisdom tooth extraction    . Joint replacement    . Hysteroscopy w/d&c N/A 04/26/2013    Procedure: DILATATION AND CURETTAGE /HYSTEROSCOPY;  Surgeon: Adam Phenix, MD;  Location: [facility];  Service: Gynecology;  Laterality: N/A;  . Laparotomy N/A 06/26/2013    Procedure: EXPLORATORY LAPAROTOMY;  Surgeon: Jeannette Corpus, MD;  Location: [facility];  Service: Gynecology;  Laterality: N/A;  . Abdominal hysterectomy Bilateral 06/26/2013    Procedure: TOTAL ABDOMINAL HYSTERECTOMY WITH BILATERAL SALPINGO OOPHERECTOMY WITH PELVIC LYMPHADNECTOMY;  Surgeon: Jeannette Corpus, MD;  Location: [facility];  Service: Gynecology;  Laterality: Bilateral;    Past Medical Hx:  Past Medical History  Diagnosis Date  . Diabetes mellitus   . Thyroid disease   . Hypertension   . SVD (spontaneous vaginal delivery)     x 3  . Hypothyroidism   . Eczema   . Hyperlipidemia     no meds  . Arthritis     legs, lower back, hands  . History of kidney stones   . Headache(784.0)     hx of  . Cancer     endometrial    Family Hx:  Family History  Problem Relation Age of Onset  . Heart disease Father   . Stroke Father   . Diabetes Father   . Breast cancer Daughter     Vitals:  Blood pressure 144/90, pulse 80, temperature 98.5 F (36.9 C),  temperature source Oral, resp. rate 18, height 5' 4.41" (1.636 m), weight 213 lb 11.2 oz (96.934 kg).  Physical Exam:  General: Well developed, well nourished female in no acute distress. Alert and oriented x 3.  Neck: Supple without any enlargements.  Lymph node survey: No cervical or supraclavicular adenopathy  Cardiovascular: Regular rate and rhythm. S1 and S2 normal.  Lungs: Clear to auscultation bilaterally. No wheezes/crackles/rhonchi noted.  Skin: No rashes or lesions present. Back: No CVA tenderness.  Abdomen: Abdomen soft, non-tender and obese. Active bowel sounds in all quadrants.  17 staples removed from the midline incision without difficulty.  No  erythema or drainage noted.  1/2 inch steri strips applied.  Mild bruising noted the the lateral aspects of the abdomen from lovenox injections daily for 28 days post-op for DVT prophylaxis.    Extremities: No bilateral cyanosis, edema, or clubbing.   Assessment/Plan:  79 year old female s/p total abdominal hysterectomy, bilateral salpingo-oophorectomy, pelvic lymphadenectomy, and lysis of adhesions by Dr. Stanford Breed on 06/26/13 for a Stage 1b grade 1 endometrioid adenocarcinoma.  She is doing well post-operatively.  Incision care reviewed along with post-operative instructions.  She is informed of Dr. Nelwyn Salisbury recommendations for no further treatment at this time.  She is advised to follow up as scheduled on 07/20/13 or sooner if needed.  She is advised to call for any questions or concerns.         Avian Greenawalt DEAL, NP 07/04/2013, 4:46 PM

## 2013-07-20 ENCOUNTER — Encounter: Payer: Self-pay | Admitting: Gynecology

## 2013-07-20 ENCOUNTER — Ambulatory Visit: Payer: Medicare Other | Attending: Gynecology | Admitting: Gynecology

## 2013-07-20 VITALS — BP 116/72 | HR 80 | Temp 97.8°F | Resp 18 | Ht 64.41 in | Wt 211.7 lb

## 2013-07-20 DIAGNOSIS — Z96649 Presence of unspecified artificial hip joint: Secondary | ICD-10-CM | POA: Insufficient documentation

## 2013-07-20 DIAGNOSIS — C549 Malignant neoplasm of corpus uteri, unspecified: Secondary | ICD-10-CM | POA: Insufficient documentation

## 2013-07-20 DIAGNOSIS — E119 Type 2 diabetes mellitus without complications: Secondary | ICD-10-CM | POA: Insufficient documentation

## 2013-07-20 DIAGNOSIS — Z79899 Other long term (current) drug therapy: Secondary | ICD-10-CM | POA: Insufficient documentation

## 2013-07-20 DIAGNOSIS — Z9079 Acquired absence of other genital organ(s): Secondary | ICD-10-CM | POA: Diagnosis not present

## 2013-07-20 DIAGNOSIS — I10 Essential (primary) hypertension: Secondary | ICD-10-CM | POA: Insufficient documentation

## 2013-07-20 DIAGNOSIS — C541 Malignant neoplasm of endometrium: Secondary | ICD-10-CM

## 2013-07-20 DIAGNOSIS — Z90710 Acquired absence of both cervix and uterus: Secondary | ICD-10-CM | POA: Diagnosis not present

## 2013-07-20 DIAGNOSIS — E039 Hypothyroidism, unspecified: Secondary | ICD-10-CM | POA: Insufficient documentation

## 2013-07-20 NOTE — Progress Notes (Signed)
Consult Note: Gyn-Onc   Debra Campbell 79 y.o. female  Chief Complaint  Patient presents with  . Endometrial cancer    Follow up    Assessment :  Stage IB grade 1 endometrial adenocarcinoma.  Plan the patient may return to full levels of activity at 6 weeks. We'll have her return to see [name] in 3 months for followup. No further therapy is recommended.   HPI: Patient is seen in consultation request of Dr. Debroah Loop regarding management of a newly diagnosed endometrial carcinoma. The patient had some abnormal bleeding approximately 3 months ago. She underwent a D&C on 04/27/2013 revealing a grade 1 endometrial carcinoma in a background of atypical complex hyperplasia.  Patient denies any constitutional symptoms. She's not having any pelvic pain. Currently she is not having any bleeding. Ultrasound exam shows a uterus measures 7.9 x 4.5 x 7.3 cm.  She has a past history of a laparotomy for an ectopic pregnancy. She's also had a total hip replacement.  Patient underwent a total abdominal hysterectomy bilateral salpingo-oophorectomy and pelvic lymphadenectomy on 06/26/2013. Final pathology showed a stage IB grade 1 endometrial carcinoma. No adjuvant therapy was recommended.  Review of Systems:10 point review of systems is negative except as noted in interval history.   Vitals: Blood pressure 116/72, pulse 80, temperature 97.8 F (36.6 C), temperature source Oral, resp. rate 18, height 5' 4.41" (1.636 m), weight 211 lb 11.2 oz (96.026 kg).  Physical Exam: General : The patient is a healthy woman in no acute distress.  HEENT: normocephalic, extraoccular movements normal; neck is supple without thyromegally  Lynphnodes: Supraclavicular and inguinal nodes not enlarged  Abdomen: Soft, non-tender, no ascites, no organomegally, no masses, small umbilical hernia. Midline incision is healing well Pelvic:  EGBUS: Normal female  Vagina: Normal, no lesions  Urethra and Bladder: Normal, non-tender   Cervix: Surgically absent  Uterus: Surgically absent Bi-manual examination: Non-tender; no adenxal masses or nodularity  Rectal: normal sphincter tone, no masses, no blood  Lower extremities: No edema or varicosities. Normal range of motion      Allergies  Allergen Reactions  . Ace Inhibitors Cough  . Iohexol      Desc: HIVES 40 YEARS AGO   . Statins     myalgia  . Iodine Rash  . Penicillins Rash  . Sulfa Antibiotics Rash    Past Medical History  Diagnosis Date  . Diabetes mellitus   . Thyroid disease   . Hypertension   . SVD (spontaneous vaginal delivery)     x 3  . Hypothyroidism   . Eczema   . Hyperlipidemia     no meds  . Arthritis     legs, lower back, hands  . History of kidney stones   . Headache(784.0)     hx of  . Cancer     endometrial    Past Surgical History  Procedure Laterality Date  . Lt knee arthroscopy    . Rt total hip replacement    . Ectopic pregnancy surgery    . Laparotomy      for ectopic pregnancy  . Tonsillectomy    . Wisdom tooth extraction    . Joint replacement    . Hysteroscopy w/d&c N/A 04/26/2013    Procedure: DILATATION AND CURETTAGE /HYSTEROSCOPY;  Surgeon: Adam Phenix, MD;  Location: [facility];  Service: Gynecology;  Laterality: N/A;  . Laparotomy N/A 06/26/2013    Procedure: EXPLORATORY LAPAROTOMY;  Surgeon: Jeannette Corpus, MD;  Location: [facility];  Service: Gynecology;  Laterality: N/A;  . Abdominal hysterectomy Bilateral 06/26/2013    Procedure: TOTAL ABDOMINAL HYSTERECTOMY WITH BILATERAL SALPINGO OOPHERECTOMY WITH PELVIC LYMPHADNECTOMY;  Surgeon: Jeannette Corpus, MD;  Location: [facility];  Service: Gynecology;  Laterality: Bilateral;    Current Outpatient Prescriptions  Medication Sig Dispense Refill  . acetaminophen (TYLENOL) 500 MG tablet Take 500-1,000 mg by mouth every 6 (six) hours as needed for pain.      [name] aspirin 81 MG tablet Take 81 mg by mouth every morning.       . diclofenac (VOLTAREN) 75 MG  EC tablet Take 75 mg by mouth at bedtime.      . enoxaparin (LOVENOX) 40 MG/0.4ML injection Inject 0.4 mLs (40 mg total) into the skin daily.  26 Syringe  0  . gabapentin (NEURONTIN) 100 MG capsule Take 200 mg by mouth at bedtime.      . hydrochlorothiazide (HYDRODIURIL) 25 MG tablet Take 25 mg by mouth every morning.      [name] levothyroxine (SYNTHROID, LEVOTHROID) 175 MCG tablet Take 175 mcg by mouth daily before breakfast.      . losartan (COZAAR) 50 MG tablet Take 50 mg by mouth at bedtime.      . metFORMIN (GLUCOPHAGE) 500 MG tablet Take 500 mg by mouth 2 (two) times daily with a meal.      . Multiple Vitamin (MULTIVITAMIN WITH MINERALS) TABS Take 1 tablet by mouth daily.      [name] DELICA LANCETS 33G MISC TEST ONCE A DAY AND AS NEEDED  100 each  0  . oxyCODONE (OXY IR/ROXICODONE) 5 MG immediate release tablet Take 1 tablet (5 mg total) by mouth every 4 (four) hours as needed (moderate to severe pain).  20 tablet  0  . PRESCRIPTION MEDICATION Apply 1 application topically 2 (two) times daily as needed (to itchy skin). Triamcinolone 0.1% / eucerine cream      . [DISCONTINUED] Pitavastatin Calcium (LIVALO) 1 MG TABS Take 1 tablet by mouth daily.         No current facility-administered medications for this visit.    History   Social History  . Marital Status: Married    Spouse Name: N/A    Number of Children: N/A  . Years of Education: N/A   Occupational History  . Not on file.   Social History Main Topics  . Smoking status: Never Smoker   . Smokeless tobacco: Never Used  . Alcohol Use: No  . Drug Use: No  . Sexual Activity: No   Other Topics Concern  . Not on file   Social History Narrative  . No narrative on file    Family History  Problem Relation Age of Onset  . Heart disease Father   . Stroke Father   . Diabetes Father   . Breast cancer Daughter       Jeannette Corpus, MD 07/20/2013, 11:20 AM

## 2013-07-20 NOTE — Patient Instructions (Signed)
You may return to full levels of activity 6 weeks following surgery. Return to see [name] for checkup in 3 months

## 2013-09-03 ENCOUNTER — Ambulatory Visit: Payer: Medicare Other | Admitting: Nurse Practitioner

## 2013-09-07 ENCOUNTER — Other Ambulatory Visit: Payer: Self-pay | Admitting: Nurse Practitioner

## 2013-09-12 ENCOUNTER — Other Ambulatory Visit: Payer: Self-pay | Admitting: Nurse Practitioner

## 2013-09-14 NOTE — Telephone Encounter (Signed)
Last seen 03/19/13

## 2013-09-26 ENCOUNTER — Encounter: Payer: Self-pay | Admitting: Nurse Practitioner

## 2013-09-26 ENCOUNTER — Ambulatory Visit (INDEPENDENT_AMBULATORY_CARE_PROVIDER_SITE_OTHER): Payer: Medicare Other | Admitting: Nurse Practitioner

## 2013-09-26 VITALS — BP 120/65 | HR 68 | Temp 97.5°F | Ht 64.0 in | Wt 214.0 lb

## 2013-09-26 DIAGNOSIS — Z23 Encounter for immunization: Secondary | ICD-10-CM

## 2013-09-26 DIAGNOSIS — E039 Hypothyroidism, unspecified: Secondary | ICD-10-CM | POA: Diagnosis not present

## 2013-09-26 DIAGNOSIS — E119 Type 2 diabetes mellitus without complications: Secondary | ICD-10-CM | POA: Diagnosis not present

## 2013-09-26 DIAGNOSIS — E785 Hyperlipidemia, unspecified: Secondary | ICD-10-CM | POA: Diagnosis not present

## 2013-09-26 DIAGNOSIS — I10 Essential (primary) hypertension: Secondary | ICD-10-CM | POA: Diagnosis not present

## 2013-09-26 LAB — POCT GLYCOSYLATED HEMOGLOBIN (HGB A1C): Hemoglobin A1C: 6.6

## 2013-09-26 MED ORDER — HYDROCHLOROTHIAZIDE 25 MG PO TABS: 25.0000 mg | ORAL_TABLET | Freq: Every morning | ORAL | Status: DC

## 2013-09-26 MED ORDER — LEVOTHYROXINE SODIUM 175 MCG PO TABS: 175.0000 ug | ORAL_TABLET | Freq: Every day | ORAL | Status: DC

## 2013-09-26 MED ORDER — LOSARTAN POTASSIUM 50 MG PO TABS: 50.0000 mg | ORAL_TABLET | Freq: Every day | ORAL | Status: DC

## 2013-09-26 MED ORDER — GABAPENTIN (ONCE-DAILY) 300 MG PO TABS: 1.0000 | ORAL_TABLET | Freq: Every day | ORAL | Status: DC

## 2013-09-26 MED ORDER — METFORMIN HCL 500 MG PO TABS: 500.0000 mg | ORAL_TABLET | Freq: Two times a day (BID) | ORAL | Status: DC

## 2013-09-26 NOTE — Patient Instructions (Signed)
Influenza Vaccine (Flu Vaccine, Inactivated) 2013 2014 What You Need to Know WHY GET VACCINATED?  Influenza ("flu") is a contagious disease that spreads around the [place] every winter, usually between October and May.  Flu is caused by the influenza virus, and can be spread by coughing, sneezing, and close contact.  Anyone can get flu, but the risk of getting flu is highest among children. Symptoms come on suddenly and may last several days. They can include:  Fever or chills.  Sore throat.  Muscle aches.  Fatigue.  Cough.  Headache.  Runny or stuffy nose. Flu can make some people much sicker than others. These people include young children, people 65 and older, pregnant women, and people with certain health conditions such as heart, lung or kidney disease, or a weakened immune system. Flu vaccine is especially important for these people, and anyone in close contact with them. Flu can also lead to pneumonia, and make existing medical conditions worse. It can cause diarrhea and seizures in children. Each year thousands of people in the [place] die from flu, and many more are hospitalized. Flu vaccine is the best protection we have from flu and its complications. Flu vaccine also helps prevent spreading flu from person to person. INACTIVATED FLU VACCINE There are 2 types of influenza vaccine:  You are getting an inactivated flu vaccine, which does not contain any live influenza virus. It is given by injection with a needle, and often called the "flu shot."  A different live, attenuated (weakened) influenza vaccine is sprayed into the nostrils. This vaccine is described in a separate Vaccine Information Statement. Flu vaccine is recommended every year. Children 6 months through 8 years of age should get 2 doses the first year they get vaccinated. Flu viruses are always changing. Each year's flu vaccine is made to protect from viruses that are most likely to cause disease  that year. While flu vaccine cannot prevent all cases of flu, it is our best defense against the disease. Inactivated flu vaccine protects against 3 or 4 different influenza viruses. It takes about 2 weeks for protection to develop after the vaccination, and protection lasts several months to a year. Some illnesses that are not caused by influenza virus are often mistaken for flu. Flu vaccine will not prevent these illnesses. It can only prevent influenza. A "high-dose" flu vaccine is available for people 65 years of age and older. The person giving you the vaccine can tell you more about it. Some inactivated flu vaccine contains a very small amount of a mercury-based preservative called thimerosal. Studies have shown that thimerosal in vaccines is not harmful, but flu vaccines that do not contain a preservative are available. SOME PEOPLE SHOULD NOT GET THIS VACCINE Tell the person who gives you the vaccine:  If you have any severe (life-threatening) allergies. If you ever had a life-threatening allergic reaction after a dose of flu vaccine, or have a severe allergy to any part of this vaccine, you may be advised not to get a dose. Most, but not all, types of flu vaccine contain a small amount of egg.  If you ever had Guillain Barr Syndrome (a severe paralyzing illness, also called GBS). Some people with a history of GBS should not get this vaccine. This should be discussed with your doctor.  If you are not feeling well. They might suggest waiting until you feel better. But you should come back. RISKS OF A VACCINE REACTION With a vaccine, like any medicine, there   is a chance of side effects. These are usually mild and go away on their own. Serious side effects are also possible, but are very rare. Inactivated flu vaccine does not contain live flu virus, sogetting flu from this vaccine is not possible. Brief fainting spells and related symptoms (such as jerking movements) can happen after any medical  procedure, including vaccination. Sitting or lying down for about 15 minutes after a vaccination can help prevent fainting and injuries caused by falls. Tell your doctor if you feel dizzy or lightheaded, or have vision changes or ringing in the ears. Mild problems following inactivated flu vaccine:  Soreness, redness, or swelling where the shot was given.  Hoarseness; sore, red or itchy eyes; or cough.  Fever.  Aches.  Headache.  Itching.  Fatigue. If these problems occur, they usually begin soon after the shot and last 1 or 2 days. Moderate problems following inactivated flu vaccine:  Young children who get inactivated flu vaccine and pneumococcal vaccine (PCV13) at the same time may be at increased risk for seizures caused by fever. Ask your doctor for more information. Tell your doctor if a child who is getting flu vaccine has ever had a seizure. Severe problems following inactivated flu vaccine:  A severe allergic reaction could occur after any vaccine (estimated less than 1 in a million doses).  There is a small possibility that inactivated flu vaccine could be associated with Guillan Barr Syndrome (GBS), no more than 1 or 2 cases per million people vaccinated. This is much lower than the risk of severe complications from flu, which can be prevented by flu vaccine. The safety of vaccines is always being monitored. For more information, visit: www.cdc.gov/vaccinesafety/ WHAT IF THERE IS A SERIOUS REACTION? What should I look for?  Look for anything that concerns you, such as signs of a severe allergic reaction, very high fever, or behavior changes. Signs of a severe allergic reaction can include hives, swelling of the face and throat, difficulty breathing, a fast heartbeat, dizziness, and weakness. These would start a few minutes to a few hours after the vaccination. What should I do?  If you think it is a severe allergic reaction or other emergency that cannot wait, call [phone] 1 1  or get the person to the nearest hospital. Otherwise, call your doctor.  Afterward, the reaction should be reported to the Vaccine Adverse Event Reporting System (VAERS). Your doctor might file this report, or you can do it yourself through the VAERS website at www.vaers.hhs.gov, or by calling 1-800-822-7967. VAERS is only for reporting reactions. They do not give medical advice. THE NATIONAL VACCINE INJURY COMPENSATION PROGRAM The National Vaccine Injury Compensation Program (VICP) is a federal program that was created to compensate people who may have been injured by certain vaccines. Persons who believe they may have been injured by a vaccine can learn about the program and about filing a claim by calling 1-800-338-2382 or visiting the VICP website at www.hrsa.gov/vaccinecompensation HOW CAN I LEARN MORE?  Ask your doctor.  Call your local or state health department.  Contact the Centers for Disease Control and Prevention (CDC):  Call 1-800-232-4636 (1-800-CDC-INFO) or  Visit CDC's website at www.cdc.gov/flu CDC Inactivated Influenza Vaccine Interim VIS (06/23/12) Document Released: 09/09/2006 Document Revised: 08/09/2012 Document Reviewed: 06/23/2012 [employer] Patient Information 2014 [employer].  

## 2013-09-26 NOTE — Progress Notes (Signed)
Subjective:    Patient ID: Debra Campbell, female    DOB: 12-Aug-1932, 79 y.o.   MRN: [id number]  Hypertension This is a chronic problem. The current episode started more than 1 year ago. The problem is unchanged. The problem is controlled. Pertinent negatives include no blurred vision, chest pain, headaches, peripheral edema or shortness of breath. There are no associated agents to hypertension. Risk factors for coronary artery disease include diabetes mellitus, dyslipidemia and post-menopausal state. Past treatments include diuretics. The current treatment provides significant improvement. Compliance problems include diet and exercise.   Hyperlipidemia This is a chronic problem. The current episode started more than 1 year ago. The problem is controlled. Recent lipid tests were reviewed and are variable. Exacerbating diseases include diabetes and obesity. Pertinent negatives include no chest pain, leg pain, myalgias or shortness of breath. She is currently on no antihyperlipidemic treatment (Refuses statin- Makes her hurt). The current treatment provides no improvement of lipids. Compliance problems include adherence to exercise and adherence to diet.  Risk factors for coronary artery disease include post-menopausal and diabetes mellitus.  Diabetes She has type 2 diabetes mellitus. No MedicAlert identification noted. The initial diagnosis of diabetes was made 10 years ago. Her disease course has been stable. There are no hypoglycemic associated symptoms. Pertinent negatives for hypoglycemia include no headaches. Pertinent negatives for diabetes include no blurred vision, no chest pain, no foot paresthesias, no foot ulcerations, no polydipsia, no polyphagia, no polyuria, no visual change, no weakness and no weight loss. There are no hypoglycemic complications. Symptoms are stable. Risk factors for coronary artery disease include dyslipidemia, obesity and post-menopausal. Current diabetic treatment includes  oral agent (monotherapy). She is compliant with treatment all of the time. Her weight is stable. She is following a generally healthy diet. When asked about meal planning, she reported none. She has not had a previous visit with a dietician. She rarely participates in exercise. There is no change in her home blood glucose trend. Her breakfast blood glucose is taken between 7-8 am. Her breakfast blood glucose range is generally 130-140 mg/dl. Her overall blood glucose range is 130-140 mg/dl. An ACE inhibitor/angiotensin II receptor blocker is not being taken. She does not see a podiatrist.Eye exam is not current (At least 2 years).  Hypothyroidism Chronic problem. Patient taking Synthroid without complications. No c/o fatigue.  Diabetic neuropathy Gabapentin working but would like to increase dose  * Patient had hysterectomy - due to uterine cancer   Review of Systems  Constitutional: Negative for weight loss.  Eyes: Negative for blurred vision.  Respiratory: Negative for shortness of breath.   Cardiovascular: Negative for chest pain.  Endocrine: Negative for polydipsia, polyphagia and polyuria.  Musculoskeletal: Negative for myalgias.  Neurological: Negative for weakness and headaches.  All other systems reviewed and are negative.       Objective:   Physical Exam  Constitutional: She is oriented to person, place, and time. She appears well-developed and well-nourished.  HENT:  Nose: Nose normal.  Mouth/Throat: Oropharynx is clear and moist.  Eyes: EOM are normal.  Neck: Trachea normal, normal range of motion and full passive range of motion without pain. Neck supple. No JVD present. Carotid bruit is not present. No thyromegaly present.  Cardiovascular: Normal rate, regular rhythm, normal heart sounds and intact distal pulses.  Exam reveals no gallop and no friction rub.   No murmur heard. Pulmonary/Chest: Effort normal and breath sounds normal.  Abdominal: Soft. Bowel sounds are  normal. She exhibits no distension  and no mass. There is no tenderness.  Musculoskeletal: Normal range of motion.  Lymphadenopathy:    She has no cervical adenopathy.  Neurological: She is alert and oriented to person, place, and time. She has normal reflexes.  (+) 4/4 monofilament bil feet   Skin: Skin is warm and dry.  Mild callus formation bil heels  Psychiatric: She has a normal mood and affect. Her behavior is normal. Judgment and thought content normal.    BP 120/65  Pulse 68  Temp(Src) 97.5 F (36.4 C) (Oral)  Ht 5\' 4"  (1.626 m)  Wt 214 lb (97.07 kg)  BMI 36.72 kg/m2 Results for orders placed in visit on 09/26/13  POCT GLYCOSYLATED HEMOGLOBIN (HGB A1C)      Result Value Range   Hemoglobin A1C 6.6%           Assessment & Plan:   1. Hypertension   2. Hypothyroidism   3. Hyperlipidemia   4. Diabetes mellitus    Orders Placed This Encounter  Procedures  . CMP14+EGFR  . NMR, lipoprofile  . Thyroid Panel With TSH  . POCT glycosylated hemoglobin (Hb A1C)   Meds ordered this encounter  Medications  . Gabapentin, PHN, 300 MG TABS    Sig: Take 1 tablet by mouth daily.    Dispense:  90 tablet    Refill:  0    Order Specific Question:  Supervising Provider    Answer:  Ernestina Penna [[id number]]  . metFORMIN (GLUCOPHAGE) 500 MG tablet    Sig: Take 1 tablet (500 mg total) by mouth 2 (two) times daily with a meal.    Dispense:  180 tablet    Refill:  1    Order Specific Question:  Supervising Provider    Answer:  Ernestina Penna [[id number]]  . losartan (COZAAR) 50 MG tablet    Sig: Take 1 tablet (50 mg total) by mouth at bedtime.    Dispense:  90 tablet    Refill:  1    Order Specific Question:  Supervising Provider    Answer:  Ernestina Penna [[id number]]  . hydrochlorothiazide (HYDRODIURIL) 25 MG tablet    Sig: Take 1 tablet (25 mg total) by mouth every morning.    Dispense:  90 tablet    Refill:  1    Order Specific Question:  Supervising Provider    Answer:  Ernestina Penna [[id number]]  . levothyroxine (SYNTHROID, LEVOTHROID) 175 MCG tablet    Sig: Take 1 tablet (175 mcg total) by mouth daily before breakfast.    Dispense:  90 tablet    Refill:  1    Order Specific Question:  Supervising Provider    Answer:  Deborra Medina    Continue all meds Labs pending Diet and exercise encouraged Health maintenance reviewed Follow up in 3 month  [name], FNP

## 2013-09-28 LAB — NMR, LIPOPROFILE
Cholesterol: 235 mg/dL — ABNORMAL HIGH (ref <200)
HDL Cholesterol by NMR: 54 mg/dL (ref >=40)
LDL Particle Number: 2156 nmol/L — ABNORMAL HIGH (ref <1000)
LDLC SERPL CALC-MCNC: 140 mg/dL — ABNORMAL HIGH (ref <100)
LP-IR Score: 59 — ABNORMAL HIGH (ref <=45)

## 2013-09-28 LAB — CMP14+EGFR
Albumin: 4 g/dL (ref 3.5–4.8)
Alkaline Phosphatase: 42 IU/L (ref 39–117)
BUN/Creatinine Ratio: 16 (ref 11–26)
BUN: 19 mg/dL (ref 8–27)
CO2: 25 mmol/L (ref 18–29)
Calcium: 9.6 mg/dL (ref 8.6–10.2)
Chloride: 100 mmol/L (ref 97–108)
Glucose: 175 mg/dL — ABNORMAL HIGH (ref 65–99)
Total Protein: 6.3 g/dL (ref 6.0–8.5)

## 2013-09-28 LAB — THYROID PANEL WITH TSH
Free Thyroxine Index: 1.8 (ref 1.2–4.9)
T3 Uptake Ratio: 31 % (ref 24–39)
TSH: 2.76 u[IU]/mL (ref 0.450–4.500)

## 2013-10-17 ENCOUNTER — Encounter: Payer: Self-pay | Admitting: Gynecology

## 2013-10-17 ENCOUNTER — Ambulatory Visit: Payer: Medicare Other | Attending: Gynecology | Admitting: Gynecology

## 2013-10-17 VITALS — BP 148/72 | HR 88 | Temp 98.3°F | Resp 18 | Ht 64.41 in | Wt 218.1 lb

## 2013-10-17 DIAGNOSIS — E119 Type 2 diabetes mellitus without complications: Secondary | ICD-10-CM | POA: Diagnosis not present

## 2013-10-17 DIAGNOSIS — Z90710 Acquired absence of both cervix and uterus: Secondary | ICD-10-CM | POA: Diagnosis not present

## 2013-10-17 DIAGNOSIS — C549 Malignant neoplasm of corpus uteri, unspecified: Secondary | ICD-10-CM | POA: Insufficient documentation

## 2013-10-17 DIAGNOSIS — K429 Umbilical hernia without obstruction or gangrene: Secondary | ICD-10-CM | POA: Diagnosis not present

## 2013-10-17 DIAGNOSIS — Z96649 Presence of unspecified artificial hip joint: Secondary | ICD-10-CM | POA: Diagnosis not present

## 2013-10-17 DIAGNOSIS — Z79899 Other long term (current) drug therapy: Secondary | ICD-10-CM | POA: Diagnosis not present

## 2013-10-17 DIAGNOSIS — Z9079 Acquired absence of other genital organ(s): Secondary | ICD-10-CM | POA: Insufficient documentation

## 2013-10-17 DIAGNOSIS — C541 Malignant neoplasm of endometrium: Secondary | ICD-10-CM

## 2013-10-17 DIAGNOSIS — I10 Essential (primary) hypertension: Secondary | ICD-10-CM | POA: Diagnosis not present

## 2013-10-17 DIAGNOSIS — E039 Hypothyroidism, unspecified: Secondary | ICD-10-CM | POA: Insufficient documentation

## 2013-10-17 NOTE — Patient Instructions (Signed)
Return to see Dr. Debroah Loop in 6 months return to see [name] in one year.

## 2013-10-17 NOTE — Progress Notes (Signed)
Consult Note: Gyn-Onc   Debra Campbell 79 y.o. female  Chief Complaint  Patient presents with  . Endometrial cancer    Follow up    Assessment :  Stage IB grade 1 endometrial adenocarcinoma. Clinically free of disease  Plan: The patient return to see Dr. Debroah Loop in 6 months return to see me in one year. Signs and symptoms recurrence were reviewed with the patient. She will have mammograms next week.   HPI: Patient is seen in consultation request of Dr. Debroah Loop regarding management of a newly diagnosed endometrial carcinoma. The patient had some abnormal bleeding approximately 3 months ago. She underwent a D&C on 04/27/2013 revealing a grade 1 endometrial carcinoma in a background of atypical complex hyperplasia.  Patient denies any constitutional symptoms. She's not having any pelvic pain. Currently she is not having any bleeding. Ultrasound exam shows a uterus measures 7.9 x 4.5 x 7.3 cm.  She has a past history of a laparotomy for an ectopic pregnancy. She's also had a total hip replacement.  Patient underwent a total abdominal hysterectomy bilateral salpingo-oophorectomy and pelvic lymphadenectomy on 06/26/2013. Final pathology showed a stage IB grade 1 endometrial carcinoma. No adjuvant therapy was recommended.  Review of Systems:10 point review of systems is negative except as noted in interval history.   Vitals: Blood pressure 148/72, pulse 88, temperature 98.3 F (36.8 C), temperature source Oral, resp. rate 18, height 5' 4.41" (1.636 m), weight 218 lb 1.6 oz (98.93 kg).  Physical Exam: General : The patient is a healthy woman in no acute distress.  HEENT: normocephalic, extraoccular movements normal; neck is supple without thyromegally  Lynphnodes: Supraclavicular and inguinal nodes not enlarged  Abdomen: Soft, non-tender, no ascites, no organomegally, no masses, small umbilical hernia. Midline incision is healing well Pelvic:  EGBUS: Normal female  Vagina: Normal, no  lesions  Urethra and Bladder: Normal, non-tender  Cervix: Surgically absent  Uterus: Surgically absent Bi-manual examination: Non-tender; no adenxal masses or nodularity  Rectal: normal sphincter tone, no masses, no blood  Lower extremities: No edema or varicosities. Normal range of motion      Allergies  Allergen Reactions  . Ace Inhibitors Cough  . Iohexol      Desc: HIVES 40 YEARS AGO   . Statins     myalgia  . Iodine Rash  . Penicillins Rash  . Sulfa Antibiotics Rash    Past Medical History  Diagnosis Date  . Diabetes mellitus   . Thyroid disease   . Hypertension   . SVD (spontaneous vaginal delivery)     x 3  . Hypothyroidism   . Eczema   . Hyperlipidemia     no meds  . Arthritis     legs, lower back, hands  . History of kidney stones   . Headache(784.0)     hx of  . Cancer     endometrial    Past Surgical History  Procedure Laterality Date  . Lt knee arthroscopy    . Rt total hip replacement    . Ectopic pregnancy surgery    . Laparotomy      for ectopic pregnancy  . Tonsillectomy    . Wisdom tooth extraction    . Joint replacement    . Hysteroscopy w/d&c N/A 04/26/2013    Procedure: DILATATION AND CURETTAGE /HYSTEROSCOPY;  Surgeon: Adam Phenix, MD;  Location: [facility];  Service: Gynecology;  Laterality: N/A;  . Laparotomy N/A 06/26/2013    Procedure: EXPLORATORY LAPAROTOMY;  Surgeon: Rande Brunt  Clarke-Pearson, MD;  Location: [facility];  Service: Gynecology;  Laterality: N/A;  . Abdominal hysterectomy Bilateral 06/26/2013    Procedure: TOTAL ABDOMINAL HYSTERECTOMY WITH BILATERAL SALPINGO OOPHERECTOMY WITH PELVIC LYMPHADNECTOMY;  Surgeon: Jeannette Corpus, MD;  Location: [facility];  Service: Gynecology;  Laterality: Bilateral;    Current Outpatient Prescriptions  Medication Sig Dispense Refill  . acetaminophen (TYLENOL) 500 MG tablet Take 500-1,000 mg by mouth every 6 (six) hours as needed for pain.      [name] aspirin 81 MG tablet Take 81 mg by mouth  every morning.       . diclofenac (VOLTAREN) 75 MG EC tablet TAKE ONE TABLET BY MOUTH TWICE DAILY  180 tablet  0  . gabapentin (NEURONTIN) 100 MG capsule       . Gabapentin, PHN, 300 MG TABS Take 1 tablet by mouth daily.  90 tablet  0  . hydrochlorothiazide (HYDRODIURIL) 25 MG tablet Take 1 tablet (25 mg total) by mouth every morning.  90 tablet  1  . levothyroxine (SYNTHROID, LEVOTHROID) 175 MCG tablet Take 1 tablet (175 mcg total) by mouth daily before breakfast.  90 tablet  1  . losartan (COZAAR) 50 MG tablet Take 1 tablet (50 mg total) by mouth at bedtime.  90 tablet  1  . metFORMIN (GLUCOPHAGE) 500 MG tablet Take 1 tablet (500 mg total) by mouth 2 (two) times daily with a meal.  180 tablet  1  . Multiple Vitamin (MULTIVITAMIN WITH MINERALS) TABS Take 1 tablet by mouth daily.      [name] oxyCODONE (OXY IR/ROXICODONE) 5 MG immediate release tablet Take 1 tablet (5 mg total) by mouth every 4 (four) hours as needed (moderate to severe pain).  20 tablet  0  . PRESCRIPTION MEDICATION Apply 1 application topically 2 (two) times daily as needed (to itchy skin). Triamcinolone 0.1% / eucerine cream      . ONETOUCH DELICA LANCETS 33G MISC TEST ONCE A DAY AND AS NEEDED  100 each  0  . [DISCONTINUED] Pitavastatin Calcium (LIVALO) 1 MG TABS Take 1 tablet by mouth daily.         No current facility-administered medications for this visit.    History   Social History  . Marital Status: Married    Spouse Name: N/A    Number of Children: N/A  . Years of Education: N/A   Occupational History  . Not on file.   Social History Main Topics  . Smoking status: Never Smoker   . Smokeless tobacco: Never Used  . Alcohol Use: No  . Drug Use: No  . Sexual Activity: No   Other Topics Concern  . Not on file   Social History Narrative  . No narrative on file    Family History  Problem Relation Age of Onset  . Heart disease Father   . Stroke Father   . Diabetes Father   . Breast cancer Daughter        Jeannette Corpus, MD 10/17/2013, 12:33 PM

## 2013-10-23 DIAGNOSIS — Z1231 Encounter for screening mammogram for malignant neoplasm of breast: Secondary | ICD-10-CM | POA: Diagnosis not present

## 2013-12-11 ENCOUNTER — Other Ambulatory Visit: Payer: Self-pay | Admitting: Nurse Practitioner

## 2013-12-12 ENCOUNTER — Other Ambulatory Visit: Payer: Self-pay | Admitting: Nurse Practitioner

## 2014-01-07 ENCOUNTER — Ambulatory Visit (INDEPENDENT_AMBULATORY_CARE_PROVIDER_SITE_OTHER): Payer: Medicare Other | Admitting: Nurse Practitioner

## 2014-01-07 ENCOUNTER — Encounter: Payer: Self-pay | Admitting: Nurse Practitioner

## 2014-01-07 VITALS — BP 118/73 | HR 73 | Temp 97.8°F | Ht 64.0 in | Wt 212.0 lb

## 2014-01-07 DIAGNOSIS — E1149 Type 2 diabetes mellitus with other diabetic neurological complication: Secondary | ICD-10-CM

## 2014-01-07 DIAGNOSIS — E119 Type 2 diabetes mellitus without complications: Secondary | ICD-10-CM

## 2014-01-07 DIAGNOSIS — E785 Hyperlipidemia, unspecified: Secondary | ICD-10-CM

## 2014-01-07 DIAGNOSIS — M899 Disorder of bone, unspecified: Secondary | ICD-10-CM

## 2014-01-07 DIAGNOSIS — M949 Disorder of cartilage, unspecified: Secondary | ICD-10-CM

## 2014-01-07 DIAGNOSIS — E1142 Type 2 diabetes mellitus with diabetic polyneuropathy: Secondary | ICD-10-CM

## 2014-01-07 DIAGNOSIS — E039 Hypothyroidism, unspecified: Secondary | ICD-10-CM

## 2014-01-07 DIAGNOSIS — I10 Essential (primary) hypertension: Secondary | ICD-10-CM

## 2014-01-07 DIAGNOSIS — M8580 Other specified disorders of bone density and structure, unspecified site: Secondary | ICD-10-CM

## 2014-01-07 DIAGNOSIS — E1140 Type 2 diabetes mellitus with diabetic neuropathy, unspecified: Secondary | ICD-10-CM

## 2014-01-07 LAB — POCT GLYCOSYLATED HEMOGLOBIN (HGB A1C): HEMOGLOBIN A1C: 7

## 2014-01-07 MED ORDER — LOSARTAN POTASSIUM 50 MG PO TABS: 50.0000 mg | ORAL_TABLET | Freq: Every day | ORAL | Status: DC

## 2014-01-07 MED ORDER — DICLOFENAC SODIUM 75 MG PO TBEC: DELAYED_RELEASE_TABLET | ORAL | Status: DC

## 2014-01-07 MED ORDER — HYDROCHLOROTHIAZIDE 25 MG PO TABS: 25.0000 mg | ORAL_TABLET | Freq: Every morning | ORAL | Status: DC

## 2014-01-07 MED ORDER — METFORMIN HCL 500 MG PO TABS: 500.0000 mg | ORAL_TABLET | Freq: Two times a day (BID) | ORAL | Status: DC

## 2014-01-07 MED ORDER — LEVOTHYROXINE SODIUM 175 MCG PO TABS: 175.0000 ug | ORAL_TABLET | Freq: Every day | ORAL | Status: DC

## 2014-01-07 MED ORDER — GABAPENTIN 100 MG PO CAPS: 100.0000 mg | ORAL_CAPSULE | Freq: Two times a day (BID) | ORAL | Status: DC

## 2014-01-07 NOTE — Patient Instructions (Signed)

## 2014-01-07 NOTE — Progress Notes (Signed)
Subjective:    Patient ID: Debra Campbell, female    DOB: 05-31-1932, 80 y.o.   MRN: [id number]  Patient here today for follow up- no complaints today.  Hypertension This is a chronic problem. The current episode started more than 1 year ago. The problem is unchanged. The problem is controlled. Pertinent negatives include no blurred vision, chest pain, headaches, peripheral edema or shortness of breath. There are no associated agents to hypertension. Risk factors for coronary artery disease include diabetes mellitus, dyslipidemia and post-menopausal state. Past treatments include diuretics. The current treatment provides significant improvement. Compliance problems include diet and exercise.   Hyperlipidemia This is a chronic problem. The current episode started more than 1 year ago. The problem is controlled. Recent lipid tests were reviewed and are variable. Exacerbating diseases include diabetes and obesity. Pertinent negatives include no chest pain, leg pain, myalgias or shortness of breath. She is currently on no antihyperlipidemic treatment (Refuses statin- Makes her hurt). The current treatment provides no improvement of lipids. Compliance problems include adherence to exercise and adherence to diet.  Risk factors for coronary artery disease include post-menopausal and diabetes mellitus.  Diabetes She has type 2 diabetes mellitus. No MedicAlert identification noted. The initial diagnosis of diabetes was made 10 years ago. Her disease course has been stable. There are no hypoglycemic associated symptoms. Pertinent negatives for hypoglycemia include no headaches. Pertinent negatives for diabetes include no blurred vision, no chest pain, no foot paresthesias, no foot ulcerations, no polydipsia, no polyphagia, no polyuria, no visual change, no weakness and no weight loss. There are no hypoglycemic complications. Symptoms are stable. Risk factors for coronary artery disease include dyslipidemia,  obesity and post-menopausal. Current diabetic treatment includes oral agent (monotherapy). She is compliant with treatment all of the time. Her weight is stable. She is following a generally healthy diet. When asked about meal planning, she reported none. She has not had a previous visit with a dietician. She rarely participates in exercise. There is no change in her home blood glucose trend. Her breakfast blood glucose is taken between 7-8 am. Her breakfast blood glucose range is generally 130-140 mg/dl. Her overall blood glucose range is 130-140 mg/dl. An ACE inhibitor/angiotensin II receptor blocker is not being taken. She does not see a podiatrist.Eye exam is not current (At least 2 years).  Hypothyroidism Chronic problem. Patient taking Synthroid without complications. No c/o fatigue.  Diabetic neuropathy Gabapentin working but would like to increase dose. Decreased dose back to 175m BID- said taht 3028mdose made her dizzy  * Patient had hysterectomy - due to uterine cancer   Review of Systems  Constitutional: Negative for weight loss.  Eyes: Negative for blurred vision.  Respiratory: Negative for shortness of breath.   Cardiovascular: Negative for chest pain.  Endocrine: Negative for polydipsia, polyphagia and polyuria.  Musculoskeletal: Negative for myalgias.  Neurological: Negative for weakness and headaches.  All other systems reviewed and are negative.       Objective:   Physical Exam  Constitutional: She is oriented to person, place, and time. She appears well-developed and well-nourished.  HENT:  Nose: Nose normal.  Mouth/Throat: Oropharynx is clear and moist.  Eyes: EOM are normal.  Neck: Trachea normal, normal range of motion and full passive range of motion without pain. Neck supple. No JVD present. Carotid bruit is not present. No thyromegaly present.  Cardiovascular: Normal rate, regular rhythm, normal heart sounds and intact distal pulses.  Exam reveals no gallop and  no friction rub.  No murmur heard. Pulmonary/Chest: Effort normal and breath sounds normal.  Abdominal: Soft. Bowel sounds are normal. She exhibits no distension and no mass. There is no tenderness.  Musculoskeletal: Normal range of motion.  Lymphadenopathy:    She has no cervical adenopathy.  Neurological: She is alert and oriented to person, place, and time. She has normal reflexes.  (+) 4/4 monofilament bil feet   Skin: Skin is warm and dry.  Mild callus formation bil heels  Psychiatric: She has a normal mood and affect. Her behavior is normal. Judgment and thought content normal.    BP 118/73  Pulse 73  Temp(Src) 97.8 F (36.6 C) (Oral)  Ht _0  (1.626 m)  Wt 212 lb (96.163 kg)  BMI 36.37 kg/m2 Results for orders placed in visit on 01/07/14  POCT GLYCOSYLATED HEMOGLOBIN (HGB A1C)      Result Value Range   Hemoglobin A1C 7.0           Assessment & Plan:   1. Hypertension   2. Hyperlipidemia   3. Diabetes mellitus   4. Diabetic neuropathy   5. Hypothyroidism   6. Osteopenia    Orders Placed This Encounter  Procedures  . DG Bone Density    Standing Status: Future     Number of Occurrences:      Standing Expiration Date: 03/09/2015    Order Specific Question:  Reason for Exam (SYMPTOM  OR DIAGNOSIS REQUIRED)    Answer:  screening    Order Specific Question:  Preferred imaging location?    Answer:  Internal  . NMR, lipoprofile  . CMP14+EGFR  . POCT glycosylated hemoglobin (Hb A1C)   Meds ordered this encounter  Medications  . DISCONTD: gabapentin (NEURONTIN) 100 MG capsule    Sig: Take 100 mg by mouth 2 (two) times daily.  . diclofenac (VOLTAREN) 75 MG EC tablet    Sig: TAKE ONE TABLET BY MOUTH TWICE DAILY    Dispense:  180 tablet    Refill:  0    Order Specific Question:  Supervising Provider    Answer:  Chipper Herb [[id number]]  . gabapentin (NEURONTIN) 100 MG capsule    Sig: Take 1 capsule (100 mg total) by mouth 2 (two) times daily.    Dispense:   180 capsule    Refill:  1    Order Specific Question:  Supervising Provider    Answer:  Chipper Herb [[id number]]  . hydrochlorothiazide (HYDRODIURIL) 25 MG tablet    Sig: Take 1 tablet (25 mg total) by mouth every morning.    Dispense:  90 tablet    Refill:  1    Order Specific Question:  Supervising Provider    Answer:  Chipper Herb [[id number]]  . levothyroxine (SYNTHROID) 175 MCG tablet    Sig: Take 1 tablet (175 mcg total) by mouth daily before breakfast.    Dispense:  90 tablet    Refill:  1    Order Specific Question:  Supervising Provider    Answer:  Chipper Herb [[id number]]  . losartan (COZAAR) 50 MG tablet    Sig: Take 1 tablet (50 mg total) by mouth at bedtime.    Dispense:  90 tablet    Refill:  1    Order Specific Question:  Supervising Provider    Answer:  Chipper Herb [[id number]]  . metFORMIN (GLUCOPHAGE) 500 MG tablet    Sig: Take 1 tablet (500 mg total) by mouth 2 (two) times daily with a meal.  Dispense:  180 tablet    Refill:  1    Order Specific Question:  Supervising Provider    Answer:  Chipper Herb [[id number]]    Labs pending Health maintenance reviewed Diet and exercise encouraged Continue all meds Follow up  In 3 months   [place], FNP

## 2014-01-09 LAB — CMP14+EGFR
A/G RATIO: 1.6 (ref 1.1–2.5)
ALBUMIN: 3.9 g/dL (ref 3.5–4.8)
ALK PHOS: 51 IU/L (ref 39–117)
ALT: 22 IU/L (ref 0–32)
AST: 16 IU/L (ref 0–40)
BILIRUBIN TOTAL: 0.5 mg/dL (ref 0.0–1.2)
BUN / CREAT RATIO: 19 (ref 11–26)
BUN: 21 mg/dL (ref 8–27)
CO2: 24 mmol/L (ref 18–29)
CREATININE: 1.11 mg/dL — AB (ref 0.57–1.00)
Calcium: 9.3 mg/dL (ref 8.7–10.3)
Chloride: 101 mmol/L (ref 97–108)
GFR, EST AFRICAN AMERICAN: 55 mL/min/{1.73_m2} — AB (ref >59)
GFR, EST NON AFRICAN AMERICAN: 47 mL/min/{1.73_m2} — AB (ref >59)
GLOBULIN, TOTAL: 2.4 g/dL (ref 1.5–4.5)
Glucose: 171 mg/dL — ABNORMAL HIGH (ref 65–99)
Potassium: 4.8 mmol/L (ref 3.5–5.2)
SODIUM: 142 mmol/L (ref 134–144)
Total Protein: 6.3 g/dL (ref 6.0–8.5)

## 2014-01-09 LAB — NMR, LIPOPROFILE
CHOLESTEROL: 212 mg/dL — AB (ref <200)
HDL CHOLESTEROL BY NMR: 50 mg/dL (ref >=40)
HDL Particle Number: 30.9 umol/L (ref >=30.5)
LDL PARTICLE NUMBER: 1661 nmol/L — AB (ref <1000)
LDL SIZE: 21 nm (ref >20.5)
LDLC SERPL CALC-MCNC: 124 mg/dL — ABNORMAL HIGH (ref <100)
LP-IR Score: 52 — ABNORMAL HIGH (ref <=45)
Small LDL Particle Number: 499 nmol/L (ref <=527)
TRIGLYCERIDES BY NMR: 189 mg/dL — AB (ref <150)

## 2014-02-04 DIAGNOSIS — E11359 Type 2 diabetes mellitus with proliferative diabetic retinopathy without macular edema: Secondary | ICD-10-CM | POA: Diagnosis not present

## 2014-02-04 DIAGNOSIS — H5200 Hypermetropia, unspecified eye: Secondary | ICD-10-CM | POA: Diagnosis not present

## 2014-02-04 DIAGNOSIS — E119 Type 2 diabetes mellitus without complications: Secondary | ICD-10-CM | POA: Diagnosis not present

## 2014-02-04 DIAGNOSIS — H52229 Regular astigmatism, unspecified eye: Secondary | ICD-10-CM | POA: Diagnosis not present

## 2014-02-06 ENCOUNTER — Encounter: Payer: Self-pay | Admitting: Pharmacist

## 2014-02-06 ENCOUNTER — Ambulatory Visit (INDEPENDENT_AMBULATORY_CARE_PROVIDER_SITE_OTHER): Payer: Medicare Other | Admitting: Pharmacist

## 2014-02-06 ENCOUNTER — Ambulatory Visit (INDEPENDENT_AMBULATORY_CARE_PROVIDER_SITE_OTHER): Payer: Medicare Other

## 2014-02-06 VITALS — BP 122/76 | HR 77 | Ht 64.0 in | Wt 214.0 lb

## 2014-02-06 DIAGNOSIS — M899 Disorder of bone, unspecified: Secondary | ICD-10-CM

## 2014-02-06 DIAGNOSIS — M8580 Other specified disorders of bone density and structure, unspecified site: Secondary | ICD-10-CM

## 2014-02-06 DIAGNOSIS — Z0000 Encounter for general adult medical examination without abnormal findings: Secondary | ICD-10-CM

## 2014-02-06 DIAGNOSIS — M949 Disorder of cartilage, unspecified: Secondary | ICD-10-CM

## 2014-02-06 NOTE — Progress Notes (Signed)
Patient ID: Debra BalzarineRebecca M Campbell, female   DOB: Jan 28, 1932, 80 y.o.   MRN: [id number]  Subjective:    Debra Campbell is a 80 y.o. female who presents for Medicare Initial preventive examination and discussion of recent DEXA results.  Preventive Screening-Counseling & Management  Tobacco History  Smoking status  . Never Smoker   Smokeless tobacco  . Never Used     Current Problems (verified) Patient Active Problem List   Diagnosis Date Noted  . Endometrial cancer 05/08/2013  . Hypertension 03/18/2011  . Hypothyroidism 03/18/2011  . Osteoarthritis 03/18/2011  . Osteopenia 03/18/2011  . Hyperlipidemia 03/18/2011  . Diabetic neuropathy 03/18/2011  . Diabetes mellitus 03/18/2011    Medications Prior to Visit Current Outpatient Prescriptions on File Prior to Visit  Medication Sig Dispense Refill  . acetaminophen (TYLENOL) 500 MG tablet Take 500-1,000 mg by mouth every 6 (six) hours as needed for pain.      [name]. aspirin 81 MG tablet Take 81 mg by mouth every morning.       . gabapentin (NEURONTIN) 100 MG capsule Take 1 capsule (100 mg total) by mouth 2 (two) times daily.  180 capsule  1  . hydrochlorothiazide (HYDRODIURIL) 25 MG tablet Take 1 tablet (25 mg total) by mouth every morning.  90 tablet  1  . levothyroxine (SYNTHROID) 175 MCG tablet Take 1 tablet (175 mcg total) by mouth daily before breakfast.  90 tablet  1  . losartan (COZAAR) 50 MG tablet Take 1 tablet (50 mg total) by mouth at bedtime.  90 tablet  1  . metFORMIN (GLUCOPHAGE) 500 MG tablet Take 1 tablet (500 mg total) by mouth 2 (two) times daily with a meal.  180 tablet  1  . Multiple Vitamin (MULTIVITAMIN WITH MINERALS) TABS Take 1 tablet by mouth daily.      [name]. ONETOUCH DELICA LANCETS 33G MISC TEST ONCE A DAY AND AS NEEDED  100 each  0  . diclofenac (VOLTAREN) 75 MG EC tablet TAKE ONE TABLET BY MOUTH TWICE DAILY  180 tablet  0  . PRESCRIPTION MEDICATION Apply 1 application topically 2 (two) times daily as needed (to itchy  skin). Triamcinolone 0.1% / eucerine cream      . [DISCONTINUED] Pitavastatin Calcium (LIVALO) 1 MG TABS Take 1 tablet by mouth daily.         No current facility-administered medications on file prior to visit.   Patient states she had not taken diclofenac in the last few months because she is concerned about side effects.  Current Medications (verified) Current Outpatient Prescriptions  Medication Sig Dispense Refill  . acetaminophen (TYLENOL) 500 MG tablet Take 500-1,000 mg by mouth every 6 (six) hours as needed for pain.      [name]. aspirin 81 MG tablet Take 81 mg by mouth every morning.       . gabapentin (NEURONTIN) 100 MG capsule Take 1 capsule (100 mg total) by mouth 2 (two) times daily.  180 capsule  1  . hydrochlorothiazide (HYDRODIURIL) 25 MG tablet Take 1 tablet (25 mg total) by mouth every morning.  90 tablet  1  . levothyroxine (SYNTHROID) 175 MCG tablet Take 1 tablet (175 mcg total) by mouth daily before breakfast.  90 tablet  1  . losartan (COZAAR) 50 MG tablet Take 1 tablet (50 mg total) by mouth at bedtime.  90 tablet  1  . metFORMIN (GLUCOPHAGE) 500 MG tablet Take 1 tablet (500 mg total) by mouth 2 (two) times daily with a meal.  180 tablet  1  . Multiple Vitamin (MULTIVITAMIN WITH MINERALS) TABS Take 1 tablet by mouth daily.      [name] DELICA LANCETS 27O MISC TEST ONCE A DAY AND AS NEEDED  100 each  0  . diclofenac (VOLTAREN) 75 MG EC tablet TAKE ONE TABLET BY MOUTH TWICE DAILY  180 tablet  0  . PRESCRIPTION MEDICATION Apply 1 application topically 2 (two) times daily as needed (to itchy skin). Triamcinolone 0.1% / eucerine cream      . [DISCONTINUED] Pitavastatin Calcium (LIVALO) 1 MG TABS Take 1 tablet by mouth daily.         No current facility-administered medications for this visit.     Allergies (verified) Ace inhibitors; Iohexol; Statins; Iodine; Penicillins; and Sulfa antibiotics   PAST HISTORY  Family History Family History  Problem Relation Age of Onset   . Heart disease Father   . Thyroid disease Father   . Heart attack Father   . Heart disease Sister     open heart surgery  . Cancer Sister     breast  . Neuropathy Sister     secondary to cancer treatment  . Breast cancer Sister   . Suicidality Brother 72    Social History History  Substance Use Topics  . Smoking status: Never Smoker   . Smokeless tobacco: Never Used  . Alcohol Use: No     Are there smokers in your home (other than you)? No  Risk Factors Current exercise habits: The patient does not participate in regular exercise at present.  Dietary issues discussed: limiting CHO and high fat foods.   Cardiac risk factors: advanced age (older than 46 for men, 46 for women), diabetes mellitus, dyslipidemia, hypertension, obesity (BMI >= 30 kg/m2) and sedentary lifestyle.  Depression Screen (Note: if answer to either of the following is "Yes", a more complete depression screening is indicated)   Over the past 2 weeks, have you felt down, depressed or hopeless? No  Over the past 2 weeks, have you felt little interest or pleasure in doing things? No  Have you lost interest or pleasure in daily life? No  Do you often feel hopeless? No  Do you cry easily over simple problems? No  Activities of Daily Living In your present state of health, do you have any difficulty performing the following activities?:  Driving? No Managing money?  No Feeding yourself? No Getting from bed to chair? No  Climbing a flight of stairs? No Preparing food and eating?: No Bathing or showering? No Getting dressed: No Getting to the toilet? No Using the toilet:No Moving around from place to place: No In the past year have you fallen or had a near fall?:No   Are you sexually active?  No  Do you have more than one partner?  No  Hearing Difficulties: No Do you often ask people to speak up or repeat themselves? No Do you experience ringing or noises in your ears? No Do you have difficulty  understanding soft or whispered voices? No   Do you feel that you have a problem with memory? No  Do you often misplace items? No  Do you feel safe at home?  Yes  Cognitive Testing  Alert? Yes  Normal Appearance?Yes  Oriented to person? Yes  Place? Yes   Time? Yes  Recall of three objects?  Yes  Can perform simple calculations? Yes  Displays appropriate judgment?Yes  Can read the correct time from a watch face?Yes  Advanced Directives have been discussed with the patient? Yes  List the Names of Other Physician/Practitioners you currently use: 1.  [facility] / GYN-ONC 2.  Louie Casa Kritzer - Neurosurgeon 3.  [place], OD - optomotrist  Indicate any recent Medical Services you may have received from other than [facility] providers in the past year (date may be approximate).  Immunization History  Administered Date(s) Administered  . Influenza,inj,Quad PF,36+ Mos 09/26/2013  . Pneumococcal Polysaccharide-23 03/29/2002  . Td 10/29/2008    Screening Tests Health Maintenance  Topic Date Due  . Zostavax  03/27/2014 (Originally 06/22/1994)  . Urine Microalbumin  03/19/2014  . Influenza Vaccine  06/29/2014  . Hemoglobin A1c  07/07/2014  . Mammogram  10/23/2014  . Foot Exam  01/07/2015  . Ophthalmology Exam  02/05/2015  . Tetanus/tdap  10/29/2018  . Colonoscopy  02/28/2019  . Pneumococcal Polysaccharide Vaccine Age 88 And Over  Completed    All answers were reviewed with the patient and necessary referrals were made:  Cherre Robins, [facility]   02/06/2014   History reviewed: allergies, current medications, past family history, past medical history, past social history, past surgical history and problem list    Objective:     Body mass index is 36.72 kg/(m^2). BP 122/76  Pulse 77  Ht 5\' 4"  (1.626 m)  Wt 214 lb (97.07 kg)  BMI 36.72 kg/m2    DEXA Results Date of Test T-Score for AP Spine L1-L4 T-Score for Total Left Hip T-Score for Total Right  Hip  02/06/2014 1.3 0.6 --  05/19/2011 0.1 0.4 --  12/04/2008 0.3 0.5 --           Assessment:     Annual Medicare Wellness Visit Medication Assessment  Normal BMD     Plan:     During the course of the visit the patient was educated and counseled about appropriate screening and preventive services including:    Pneumococcal vaccine   Influenza vaccine  Hepatitis B vaccine  Td vaccine  Screening mammography  Screening Pap smear and pelvic exam   Bone densitometry screening  Colorectal cancer screening  Diabetes screening  Glaucoma screening  Patient instructed ok to increase APAP as needed to treat arthritis pain up to 4 capsules daily (or 2000mg ). Discussed pros and cons of daily NSAID use.  Patient is concerned about affect on heart and encouraged her to used as needed.   continue calcium 1200mg  daily through supplementation or diet.  Recommend weight bearing exercise - 30 minutes at least 4 days per week.   Counseled and educated about fall risk and preveention  Patient Instructions (the written plan) was given to the patient.  Medicare Attestation I have personally reviewed: The patient's medical and social history Their use of alcohol, tobacco or illicit drugs Their current medications and supplements The patient's functional ability including ADLs,fall risks, home safety risks, cognitive, and hearing and visual impairment Diet and physical activities Evidence for depression or mood disorders  The patient's weight, height, BMI, and BP/HR  have been recorded in the chart.  I have made referrals, counseling, and provided education to the patient based on review of the above and I have provided the patient with a written personalized care plan for preventive services.     Cherre Robins, [facility]   02/06/2014

## 2014-02-06 NOTE — Patient Instructions (Addendum)
Health Maintenance Summary    ZOSTAVAX Postponed 03/27/2014 Discussed - waiting on insurance to cover    URINE MICROALBUMIN Next Due 03/19/2014  last 02/2013    INFLUENZA VACCINE Next Due 06/29/2014  last 09/26/2013    HEMOGLOBIN A1C Next Due 07/07/2014  last was 7.0% on 01/07/2014    MAMMOGRAM Next Due 10/23/2014  last 10/30/2013    FOOT EXAM Next Due 01/07/2015      OPHTHALMOLOGY EXAM Next Due 02/05/2015  last was 02/04/2014    TETANUS/TDAP Next Due 10/29/2018  last was 10/29/2008    COLONOSCOPY Next Due 02/28/2019  last was 02/27/2209       Preventive Care for Adults, Female A healthy lifestyle and preventive care can promote health and wellness. Preventive health guidelines for women include the following key practices.  A routine yearly physical is a good way to check with your health care provider about your health and preventive screening. It is a chance to share any concerns and updates on your health and to receive a thorough exam.  Visit your dentist for a routine exam and preventive care every 6 months. Brush your teeth twice a day and floss once a day. Good oral hygiene prevents tooth decay and gum disease.  The frequency of eye exams is based on your age, health, family medical history, use of contact lenses, and other factors. Follow your health care provider's recommendations for frequency of eye exams.  Eat a healthy diet. Foods like vegetables, fruits, whole grains, low-fat dairy products, and lean protein foods contain the nutrients you need without too many calories. Decrease your intake of foods high in solid fats, added sugars, and salt. Eat the right amount of calories for you.Get information about a proper diet from your health care provider, if necessary.  Regular physical exercise is one of the most important things you can do for your health. Most adults should get at least 150 minutes of moderate-intensity exercise (any activity that increases your heart rate and causes you to  sweat) each week. In addition, most adults need muscle-strengthening exercises on 2 or more days a week.  Maintain a healthy weight. The body mass index (BMI) is a screening tool to identify possible weight problems. It provides an estimate of body fat based on height and weight. Your health care provider can find your BMI, and can help you achieve or maintain a healthy weight.For adults 20 years and older:  A BMI below 18.5 is considered underweight.  A BMI of 18.5 to 24.9 is normal.  A BMI of 25 to 29.9 is considered overweight.  A BMI of 30 and above is considered obese.  Maintain normal blood lipids and cholesterol levels by exercising and minimizing your intake of saturated fat. Eat a balanced diet with plenty of fruit and vegetables. Blood tests for lipids and cholesterol should begin at age 26 and be repeated every 5 years. If your lipid or cholesterol levels are high, you are over 50, or you are at high risk for heart disease, you may need your cholesterol levels checked more frequently.Ongoing high lipid and cholesterol levels should be treated with medicines if diet and exercise are not working.  If you smoke, find out from your health care provider how to quit. If you do not use tobacco, do not start.  Lung cancer screening is recommended for adults aged 59 80 years who are at high risk for developing lung cancer because of a history of smoking. A yearly low-dose CT scan  of the lungs is recommended for people who have at least a 30-pack-year history of smoking and are a current smoker or have quit within the past 15 years. A pack year of smoking is smoking an average of 1 pack of cigarettes a day for 1 year (for example: 1 pack a day for 30 years or 2 packs a day for 15 years). Yearly screening should continue until the smoker has stopped smoking for at least 15 years. Yearly screening should be stopped for people who develop a health problem that would prevent them from having lung  cancer treatment.  If you are pregnant, do not drink alcohol. If you are breastfeeding, be very cautious about drinking alcohol. If you are not pregnant and choose to drink alcohol, do not have more than 1 drink per day. One drink is considered to be 12 ounces (355 mL) of beer, 5 ounces (148 mL) of wine, or 1.5 ounces (44 mL) of liquor.  Avoid use of street drugs. Do not share needles with anyone. Ask for help if you need support or instructions about stopping the use of drugs.  High blood pressure causes heart disease and increases the risk of stroke. Your blood pressure should be checked at least every 1 to 2 years. Ongoing high blood pressure should be treated with medicines if weight loss and exercise do not work.  If you are 19 78 years old, ask your health care provider if you should take aspirin to prevent strokes.  Diabetes screening involves taking a blood sample to check your fasting blood sugar level. This should be done once every 3 years, after age 55, if you are within normal weight and without risk factors for diabetes. Testing should be considered at a younger age or be carried out more frequently if you are overweight and have at least 1 risk factor for diabetes.  Breast cancer screening is essential preventive care for women. You should practice "breast self-awareness." This means understanding the normal appearance and feel of your breasts and may include breast self-examination. Any changes detected, no matter how small, should be reported to a health care provider. Women in their 8s and 30s should have a clinical breast exam (CBE) by a health care provider as part of a regular health exam every 1 to 3 years. After age 58, women should have a CBE every year. Starting at age 51, women should consider having a mammogram (breast X-ray test) every year. Women who have a family history of breast cancer should talk to their health care provider about genetic screening. Women at a high risk  of breast cancer should talk to their health care providers about having an MRI and a mammogram every year.  Breast cancer gene (BRCA)-related cancer risk assessment is recommended for women who have family members with BRCA-related cancers. BRCA-related cancers include breast, ovarian, tubal, and peritoneal cancers. Having family members with these cancers may be associated with an increased risk for harmful changes (mutations) in the breast cancer genes BRCA1 and BRCA2. Results of the assessment will determine the need for genetic counseling and BRCA1 and BRCA2 testing.  The Pap test is a screening test for cervical cancer. A Pap test can show cell changes on the cervix that might become cervical cancer if left untreated. A Pap test is a procedure in which cells are obtained and examined from the lower end of the uterus (cervix).  Women should have a Pap test starting at age 38.  Between ages 6  and 29, Pap tests should be repeated every 2 years.  Beginning at age 55, you should have a Pap test every 3 years as long as the past 3 Pap tests have been normal.  Some women have medical problems that increase the chance of getting cervical cancer. Talk to your health care provider about these problems. It is especially important to talk to your health care provider if a new problem develops soon after your last Pap test. In these cases, your health care provider may recommend more frequent screening and Pap tests.  The above recommendations are the same for women who have or have not gotten the vaccine for human papillomavirus (HPV).  If you had a hysterectomy for a problem that was not cancer or a condition that could lead to cancer, then you no longer need Pap tests. Even if you no longer need a Pap test, a regular exam is a good idea to make sure no other problems are starting.  If you are between ages 91 and 48 years, and you have had normal Pap tests going back 10 years, you no longer need Pap  tests. Even if you no longer need a Pap test, a regular exam is a good idea to make sure no other problems are starting.  If you have had past treatment for cervical cancer or a condition that could lead to cancer, you need Pap tests and screening for cancer for at least 20 years after your treatment.  If Pap tests have been discontinued, risk factors (such as a new sexual partner) need to be reassessed to determine if screening should be resumed.  The HPV test is an additional test that may be used for cervical cancer screening. The HPV test looks for the virus that can cause the cell changes on the cervix. The cells collected during the Pap test can be tested for HPV. The HPV test could be used to screen women aged 29 years and older, and should be used in women of any age who have unclear Pap test results. After the age of 32, women should have HPV testing at the same frequency as a Pap test.  Colorectal cancer can be detected and often prevented. Most routine colorectal cancer screening begins at the age of 73 years and continues through age 64 years. However, your health care provider may recommend screening at an earlier age if you have risk factors for colon cancer. On a yearly basis, your health care provider may provide home test kits to check for hidden blood in the stool. Use of a small camera at the end of a tube, to directly examine the colon (sigmoidoscopy or colonoscopy), can detect the earliest forms of colorectal cancer. Talk to your health care provider about this at age 41, when routine screening begins. Direct exam of the colon should be repeated every 5 10 years through age 12 years, unless early forms of pre-cancerous polyps or small growths are found.  People who are at an increased risk for hepatitis B should be screened for this virus. You are considered at high risk for hepatitis B if:  You were born in a country where hepatitis B occurs often. Talk with your health care  provider about which countries are considered high risk.  Your parents were born in a high-risk country and you have not received a shot to protect against hepatitis B (hepatitis B vaccine).  You have HIV or AIDS.  You use needles to inject street drugs.  You live with, or have sex with, someone who has Hepatitis B.  You get hemodialysis treatment.  You take certain medicines for conditions like cancer, organ transplantation, and autoimmune conditions.  Hepatitis C blood testing is recommended for all people born from [birth year] through 1963 and any individual with known risks for hepatitis C.  Practice safe sex. Use condoms and avoid high-risk sexual practices to reduce the spread of sexually transmitted infections (STIs). STIs include gonorrhea, chlamydia, syphilis, trichomonas, herpes, HPV, and human immunodeficiency virus (HIV). Herpes, HIV, and HPV are viral illnesses that have no cure. They can result in disability, cancer, and death. Sexually active women aged 19 years and younger should be checked for chlamydia. Older women with new or multiple partners should also be tested for chlamydia. Testing for other STIs is recommended if you are sexually active and at increased risk.  Osteoporosis is a disease in which the bones lose minerals and strength with aging. This can result in serious bone fractures or breaks. The risk of osteoporosis can be identified using a bone density scan. Women ages 9 years and over and women at risk for fractures or osteoporosis should discuss screening with their health care providers. Ask your health care provider whether you should take a calcium supplement or vitamin D to reduce the rate of osteoporosis.  Menopause can be associated with physical symptoms and risks. Hormone replacement therapy is available to decrease symptoms and risks. You should talk to your health care provider about whether hormone replacement therapy is right for you.  Use sunscreen. Apply  sunscreen liberally and repeatedly throughout the day. You should seek shade when your shadow is shorter than you. Protect yourself by wearing long sleeves, pants, a wide-brimmed hat, and sunglasses year round, whenever you are outdoors.  Once a month, do a whole body skin exam, using a mirror to look at the skin on your back. Tell your health care provider of new moles, moles that have irregular borders, moles that are larger than a pencil eraser, or moles that have changed in shape or color.  Stay current with required vaccines (immunizations).  Influenza vaccine. All adults should be immunized every year.  Tetanus, diphtheria, and acellular pertussis (Td, Tdap) vaccine. Pregnant women should receive 1 dose of Tdap vaccine during each pregnancy. The dose should be obtained regardless of the length of time since the last dose. Immunization is preferred during the 27th 36th week of gestation. An adult who has not previously received Tdap or who does not know her vaccine status should receive 1 dose of Tdap. This initial dose should be followed by tetanus and diphtheria toxoids (Td) booster doses every 10 years. Adults with an unknown or incomplete history of completing a 3-dose immunization series with Td-containing vaccines should begin or complete a primary immunization series including a Tdap dose. Adults should receive a Td booster every 10 years.  Varicella vaccine. An adult without evidence of immunity to varicella should receive 2 doses or a second dose if she has previously received 1 dose. Pregnant females who do not have evidence of immunity should receive the first dose after pregnancy. This first dose should be obtained before leaving the health care facility. The second dose should be obtained 4 8 weeks after the first dose.  Human papillomavirus (HPV) vaccine. Females aged 56 26 years who have not received the vaccine previously should obtain the 3-dose series. The vaccine is not  recommended for use in pregnant females. However, pregnancy testing is not  needed before receiving a dose. If a female is found to be pregnant after receiving a dose, no treatment is needed. In that case, the remaining doses should be delayed until after the pregnancy. Immunization is recommended for any person with an immunocompromised condition through the age of 68 years if she did not get any or all doses earlier. During the 3-dose series, the second dose should be obtained 4 8 weeks after the first dose. The third dose should be obtained 24 weeks after the first dose and 16 weeks after the second dose.  Zoster vaccine. One dose is recommended for adults aged 60 years or older unless certain conditions are present.  Measles, mumps, and rubella (MMR) vaccine. Adults born before [birth year] generally are considered immune to measles and mumps. Adults born in [birth year] or later should have 1 or more doses of MMR vaccine unless there is a contraindication to the vaccine or there is laboratory evidence of immunity to each of the three diseases. A routine second dose of MMR vaccine should be obtained at least 28 days after the first dose for students attending postsecondary schools, health care workers, or international travelers. People who received inactivated measles vaccine or an unknown type of measles vaccine during 1963 1967 should receive 2 doses of MMR vaccine. People who received inactivated mumps vaccine or an unknown type of mumps vaccine before 1979 and are at high risk for mumps infection should consider immunization with 2 doses of MMR vaccine. For females of childbearing age, rubella immunity should be determined. If there is no evidence of immunity, females who are not pregnant should be vaccinated. If there is no evidence of immunity, females who are pregnant should delay immunization until after pregnancy. Unvaccinated health care workers born before [birth year] who lack laboratory evidence of measles, mumps, or  rubella immunity or laboratory confirmation of disease should consider measles and mumps immunization with 2 doses of MMR vaccine or rubella immunization with 1 dose of MMR vaccine.  Pneumococcal 13-valent conjugate (PCV13) vaccine. When indicated, a person who is uncertain of her immunization history and has no record of immunization should receive the PCV13 vaccine. An adult aged 60 years or older who has certain medical conditions and has not been previously immunized should receive 1 dose of PCV13 vaccine. This PCV13 should be followed with a dose of pneumococcal polysaccharide (PPSV23) vaccine. The PPSV23 vaccine dose should be obtained at least 8 weeks after the dose of PCV13 vaccine. An adult aged 52 years or older who has certain medical conditions and previously received 1 or more doses of PPSV23 vaccine should receive 1 dose of PCV13. The PCV13 vaccine dose should be obtained 1 or more years after the last PPSV23 vaccine dose.  Pneumococcal polysaccharide (PPSV23) vaccine. When PCV13 is also indicated, PCV13 should be obtained first. All adults aged 62 years and older should be immunized. An adult younger than age 63 years who has certain medical conditions should be immunized. Any person who resides in a nursing home or long-term care facility should be immunized. An adult smoker should be immunized. People with an immunocompromised condition and certain other conditions should receive both PCV13 and PPSV23 vaccines. People with human immunodeficiency virus (HIV) infection should be immunized as soon as possible after diagnosis. Immunization during chemotherapy or radiation therapy should be avoided. Routine use of PPSV23 vaccine is not recommended for American Indians, [place] Natives, or people younger than 65 years unless there are medical conditions that require PPSV23 vaccine. When indicated,  people who have unknown immunization and have no record of immunization should receive PPSV23 vaccine.  One-time revaccination 5 years after the first dose of PPSV23 is recommended for people aged 40 64 years who have chronic kidney failure, nephrotic syndrome, asplenia, or immunocompromised conditions. People who received 1 2 doses of PPSV23 before age 22 years should receive another dose of PPSV23 vaccine at age 26 years or later if at least 5 years have passed since the previous dose. Doses of PPSV23 are not needed for people immunized with PPSV23 at or after age 56 years.  Meningococcal vaccine. Adults with asplenia or persistent complement component deficiencies should receive 2 doses of quadrivalent meningococcal conjugate (MenACWY-D) vaccine. The doses should be obtained at least 2 months apart. Microbiologists working with certain meningococcal bacteria, [employer], people at risk during an outbreak, and people who travel to or live in countries with a high rate of meningitis should be immunized. A first-year college student up through age 68 years who is living in a residence hall should receive a dose if she did not receive a dose on or after her 16th birthday. Adults who have certain high-risk conditions should receive one or more doses of vaccine.  Hepatitis A vaccine. Adults who wish to be protected from this disease, have certain high-risk conditions, work with hepatitis A-infected animals, work in hepatitis A research labs, or travel to or work in countries with a high rate of hepatitis A should be immunized. Adults who were previously unvaccinated and who anticipate close contact with an international adoptee during the first 60 days after arrival in the [place] from a country with a high rate of hepatitis A should be immunized.  Hepatitis B vaccine. Adults who wish to be protected from this disease, have certain high-risk conditions, may be exposed to blood or other infectious body fluids, are household contacts or sex partners of hepatitis B positive people, are clients or workers  in certain care facilities, or travel to or work in countries with a high rate of hepatitis B should be immunized.   Fall Prevention and Home Safety Falls cause injuries and can affect all age groups. It is possible to use preventive measures to significantly decrease the likelihood of falls. There are many simple measures which can make your home safer and prevent falls. OUTDOORS  Repair cracks and edges of walkways and driveways.  Remove high doorway thresholds.  Trim shrubbery on the main path into your home.  Have good outside lighting.  Clear walkways of tools, rocks, debris, and clutter.  Check that handrails are not broken and are securely fastened. Both sides of steps should have handrails.  Have leaves, snow, and ice cleared regularly.  Use sand or salt on walkways during winter months.  In the garage, clean up grease or oil spills. BATHROOM  Install night lights.  Install grab bars by the toilet and in the tub and shower.  Use non-skid mats or decals in the tub or shower.  Place a plastic non-slip stool in the shower to sit on, if needed.  Keep floors dry and clean up all water on the floor immediately.  Remove soap buildup in the tub or shower on a regular basis.  Secure bath mats with non-slip, double-sided rug tape.  Remove throw rugs and tripping hazards from the floors. BEDROOMS  Install night lights.  Make sure a bedside light is easy to reach.  Do not use oversized bedding.  Keep a telephone by your bedside.  Have a firm chair with side arms to use for getting dressed.  Remove throw rugs and tripping hazards from the floor. KITCHEN  Keep handles on pots and pans turned toward the center of the stove. Use back burners when possible.  Clean up spills quickly and allow time for drying.  Avoid walking on wet floors.  Avoid hot utensils and knives.  Position shelves so they are not too high or low.  Place commonly used objects within easy  reach.  If necessary, use a sturdy step stool with a grab bar when reaching.  Keep electrical cables out of the way.  Do not use floor polish or wax that makes floors slippery. If you must use wax, use non-skid floor wax.  Remove throw rugs and tripping hazards from the floor. STAIRWAYS  Never leave objects on stairs.  Place handrails on both sides of stairways and use them. Fix any loose handrails. Make sure handrails on both sides of the stairways are as long as the stairs.  Check carpeting to make sure it is firmly attached along stairs. Make repairs to worn or loose carpet promptly.  Avoid placing throw rugs at the top or bottom of stairways, or properly secure the rug with carpet tape to prevent slippage. Get rid of throw rugs, if possible.  Have an electrician put in a light switch at the top and bottom of the stairs. OTHER FALL PREVENTION TIPS  Wear low-heel or rubber-soled shoes that are supportive and fit well. Wear closed toe shoes.  When using a stepladder, make sure it is fully opened and both spreaders are firmly locked. Do not climb a closed stepladder.  Add color or contrast paint or tape to grab bars and handrails in your home. Place contrasting color strips on first and last steps.  Learn and use mobility aids as needed. Install an electrical emergency response system.  Turn on lights to avoid dark areas. Replace light bulbs that burn out immediately. Get light switches that glow.  Arrange furniture to create clear pathways. Keep furniture in the same place.  Firmly attach carpet with non-skid or double-sided tape.  Eliminate uneven floor surfaces.  Select a carpet pattern that does not visually hide the edge of steps.  Be aware of all pets. OTHER HOME SAFETY TIPS  Set the water temperature for 120 F (48.8 C).  Keep emergency numbers on or near the telephone.  Keep smoke detectors on every level of the home and near sleeping areas. Document Released:  11/05/2002 Document Revised: 05/16/2012 Document Reviewed: 02/04/2012 [facility] Patient Information 2014 [place].

## 2014-03-02 ENCOUNTER — Other Ambulatory Visit: Payer: Self-pay | Admitting: Nurse Practitioner

## 2014-04-08 ENCOUNTER — Ambulatory Visit (INDEPENDENT_AMBULATORY_CARE_PROVIDER_SITE_OTHER): Payer: Medicare Other | Admitting: Nurse Practitioner

## 2014-04-08 ENCOUNTER — Encounter: Payer: Self-pay | Admitting: Nurse Practitioner

## 2014-04-08 VITALS — BP 123/71 | HR 69 | Temp 98.3°F | Ht 64.0 in | Wt 218.0 lb

## 2014-04-08 DIAGNOSIS — E1140 Type 2 diabetes mellitus with diabetic neuropathy, unspecified: Secondary | ICD-10-CM

## 2014-04-08 DIAGNOSIS — E785 Hyperlipidemia, unspecified: Secondary | ICD-10-CM

## 2014-04-08 DIAGNOSIS — M899 Disorder of bone, unspecified: Secondary | ICD-10-CM

## 2014-04-08 DIAGNOSIS — M1990 Unspecified osteoarthritis, unspecified site: Secondary | ICD-10-CM

## 2014-04-08 DIAGNOSIS — M949 Disorder of cartilage, unspecified: Secondary | ICD-10-CM

## 2014-04-08 DIAGNOSIS — E119 Type 2 diabetes mellitus without complications: Secondary | ICD-10-CM

## 2014-04-08 DIAGNOSIS — E1149 Type 2 diabetes mellitus with other diabetic neurological complication: Secondary | ICD-10-CM

## 2014-04-08 DIAGNOSIS — E1142 Type 2 diabetes mellitus with diabetic polyneuropathy: Secondary | ICD-10-CM

## 2014-04-08 DIAGNOSIS — M8580 Other specified disorders of bone density and structure, unspecified site: Secondary | ICD-10-CM

## 2014-04-08 DIAGNOSIS — E039 Hypothyroidism, unspecified: Secondary | ICD-10-CM | POA: Diagnosis not present

## 2014-04-08 DIAGNOSIS — I10 Essential (primary) hypertension: Secondary | ICD-10-CM

## 2014-04-08 LAB — POCT GLYCOSYLATED HEMOGLOBIN (HGB A1C)

## 2014-04-08 LAB — POCT UA - MICROALBUMIN: Microalbumin Ur, POC: NEGATIVE mg/L

## 2014-04-08 MED ORDER — HYDROCHLOROTHIAZIDE 25 MG PO TABS: 25.0000 mg | ORAL_TABLET | Freq: Every morning | ORAL | Status: DC

## 2014-04-08 MED ORDER — METFORMIN HCL 500 MG PO TABS: 500.0000 mg | ORAL_TABLET | Freq: Two times a day (BID) | ORAL | Status: DC

## 2014-04-08 MED ORDER — LOSARTAN POTASSIUM 50 MG PO TABS: 50.0000 mg | ORAL_TABLET | Freq: Every day | ORAL | Status: DC

## 2014-04-08 MED ORDER — GABAPENTIN 100 MG PO CAPS: 100.0000 mg | ORAL_CAPSULE | Freq: Two times a day (BID) | ORAL | Status: DC

## 2014-04-08 MED ORDER — LEVOTHYROXINE SODIUM 175 MCG PO TABS: 175.0000 ug | ORAL_TABLET | Freq: Every day | ORAL | Status: DC

## 2014-04-08 NOTE — Progress Notes (Signed)
Subjective:    Patient ID: Debra Campbell, female    DOB: [birth year], 80 y.o.   MRN: [id number]  Patient here today for follow up of chronic medical problems.- no complaints today.   Hypertension This is a chronic problem. The current episode started more than 1 year ago. The problem is unchanged. The problem is controlled. Pertinent negatives include no blurred vision, chest pain, headaches, peripheral edema or shortness of breath. There are no associated agents to hypertension. Risk factors for coronary artery disease include diabetes mellitus, dyslipidemia and post-menopausal state. Past treatments include diuretics. The current treatment provides significant improvement. Compliance problems include diet and exercise.   Hyperlipidemia This is a chronic problem. The current episode started more than 1 year ago. The problem is controlled. Recent lipid tests were reviewed and are variable. Exacerbating diseases include diabetes and obesity. Pertinent negatives include no chest pain, leg pain, myalgias or shortness of breath. She is currently on no antihyperlipidemic treatment (Refuses statin- Makes her hurt). The current treatment provides no improvement of lipids. Compliance problems include adherence to exercise and adherence to diet.  Risk factors for coronary artery disease include post-menopausal and diabetes mellitus.  Diabetes She has type 2 diabetes mellitus. No MedicAlert identification noted. The initial diagnosis of diabetes was made 10 years ago. Her disease course has been stable. There are no hypoglycemic associated symptoms. Pertinent negatives for hypoglycemia include no headaches. Pertinent negatives for diabetes include no blurred vision, no chest pain, no foot paresthesias, no foot ulcerations, no polydipsia, no polyphagia, no polyuria, no visual change, no weakness and no weight loss. There are no hypoglycemic complications. Symptoms are stable. Risk factors for coronary artery  disease include dyslipidemia, obesity and post-menopausal. Current diabetic treatment includes oral agent (monotherapy). She is compliant with treatment all of the time. Her weight is stable. She is following a generally healthy diet. When asked about meal planning, she reported none. She has not had a previous visit with a dietician. She rarely participates in exercise. There is no change in her home blood glucose trend. Her breakfast blood glucose is taken between 7-8 am. Her breakfast blood glucose range is generally 130-140 mg/dl. Her overall blood glucose range is 130-140 mg/dl. An ACE inhibitor/angiotensin II receptor blocker is not being taken. She does not see a podiatrist.Eye exam is not current (At least 2 years).  Hypothyroidism Chronic problem. Patient taking Synthroid without complications. No c/o fatigue.  Diabetic neuropathy Gabapentin working but would like to increase dose. Decreased dose back to 132m BID- said taht 3068mdose made her dizzy osteopenia  Last bone density test was 02/06/14- stable.   Review of Systems  Constitutional: Negative for weight loss.  Eyes: Negative for blurred vision.  Respiratory: Negative for shortness of breath.   Cardiovascular: Negative for chest pain.  Endocrine: Negative for polydipsia, polyphagia and polyuria.  Musculoskeletal: Negative for myalgias.  Neurological: Negative for weakness and headaches.  All other systems reviewed and are negative.      Objective:   Physical Exam  Constitutional: She is oriented to person, place, and time. She appears well-developed and well-nourished.  HENT:  Nose: Nose normal.  Mouth/Throat: Oropharynx is clear and moist.  Eyes: EOM are normal.  Neck: Trachea normal, normal range of motion and full passive range of motion without pain. Neck supple. No JVD present. Carotid bruit is not present. No thyromegaly present.  Cardiovascular: Normal rate, regular rhythm, normal heart sounds and intact distal  pulses.  Exam reveals no gallop and  no friction rub.   No murmur heard. Pulmonary/Chest: Effort normal and breath sounds normal.  Abdominal: Soft. Bowel sounds are normal. She exhibits no distension and no mass. There is no tenderness.  Musculoskeletal: Normal range of motion.  Lymphadenopathy:    She has no cervical adenopathy.  Neurological: She is alert and oriented to person, place, and time. She has normal reflexes.  (+) 4/4 monofilament bil feet   Skin: Skin is warm and dry.  Mild callus formation bil heels  Psychiatric: She has a normal mood and affect. Her behavior is normal. Judgment and thought content normal.    BP 123/71  Pulse 69  Temp(Src) 98.3 F (36.8 C) (Oral)  Ht 5' 4"  (1.626 m)  Wt 218 lb (98.884 kg)  BMI 37.40 kg/m2 Results for orders placed in visit on 04/08/14  POCT GLYCOSYLATED HEMOGLOBIN (HGB A1C)      Result Value Ref Range   Hemoglobin A1C 7.2%           Assessment & Plan:   1. Type II or unspecified type diabetes mellitus without mention of complication, not stated as uncontrolled   2. Osteopenia   3. Osteoarthritis   4. Hypothyroidism   5. Hypertension   6. Hyperlipidemia   7. Diabetic neuropathy    Orders Placed This Encounter  Procedures  . CMP14+EGFR  . NMR, lipoprofile  . POCT glycosylated hemoglobin (Hb A1C)  . POCT UA - Microalbumin   Meds ordered this encounter  Medications  . gabapentin (NEURONTIN) 100 MG capsule    Sig: Take 1 capsule (100 mg total) by mouth 2 (two) times daily.    Dispense:  180 capsule    Refill:  1    Order Specific Question:  Supervising Provider    Answer:  Chipper Herb [[id number]]  . hydrochlorothiazide (HYDRODIURIL) 25 MG tablet    Sig: Take 1 tablet (25 mg total) by mouth every morning.    Dispense:  90 tablet    Refill:  1    Order Specific Question:  Supervising Provider    Answer:  Chipper Herb [[id number]]  . levothyroxine (SYNTHROID) 175 MCG tablet    Sig: Take 1 tablet (175 mcg total) by  mouth daily before breakfast.    Dispense:  90 tablet    Refill:  1    Order Specific Question:  Supervising Provider    Answer:  Chipper Herb [[id number]]  . losartan (COZAAR) 50 MG tablet    Sig: Take 1 tablet (50 mg total) by mouth at bedtime.    Dispense:  90 tablet    Refill:  1    Order Specific Question:  Supervising Provider    Answer:  Chipper Herb [[id number]]  . metFORMIN (GLUCOPHAGE) 500 MG tablet    Sig: Take 1 tablet (500 mg total) by mouth 2 (two) times daily with a meal.    Dispense:  180 tablet    Refill:  1    Order Specific Question:  Supervising Provider    Answer:  Chipper Herb [[id number]]    Labs pending Health maintenance reviewed Diet and exercise encouraged Continue all meds Follow up  In 3 month   Gamaliel, FNP

## 2014-04-08 NOTE — Patient Instructions (Signed)
Diabetes and Foot Care Diabetes may cause you to have problems because of poor blood supply (circulation) to your feet and legs. This may cause the skin on your feet to become thinner, break easier, and heal more slowly. Your skin may become dry, and the skin may peel and crack. You may also have nerve damage in your legs and feet causing decreased feeling in them. You may not notice minor injuries to your feet that could lead to infections or more serious problems. Taking care of your feet is one of the most important things you can do for yourself.  HOME CARE INSTRUCTIONS  Wear shoes at all times, even in the house. Do not go barefoot. Bare feet are easily injured.  Check your feet daily for blisters, cuts, and redness. If you cannot see the bottom of your feet, use a mirror or ask someone for help.  Wash your feet with warm water (do not use hot water) and mild soap. Then pat your feet and the areas between your toes until they are completely dry. Do not soak your feet as this can dry your skin.  Apply a moisturizing lotion or petroleum jelly (that does not contain alcohol and is unscented) to the skin on your feet and to dry, brittle toenails. Do not apply lotion between your toes.  Trim your toenails straight across. Do not dig under them or around the cuticle. File the edges of your nails with an emery board or nail file.  Do not cut corns or calluses or try to remove them with medicine.  Wear clean socks or stockings every day. Make sure they are not too tight. Do not wear knee-high stockings since they may decrease blood flow to your legs.  Wear shoes that fit properly and have enough cushioning. To break in new shoes, wear them for just a few hours a day. This prevents you from injuring your feet. Always look in your shoes before you put them on to be sure there are no objects inside.  Do not cross your legs. This may decrease the blood flow to your feet.  If you find a minor scrape,  cut, or break in the skin on your feet, keep it and the skin around it clean and dry. These areas may be cleansed with mild soap and water. Do not cleanse the area with peroxide, alcohol, or iodine.  When you remove an adhesive bandage, be sure not to damage the skin around it.  If you have a wound, look at it several times a day to make sure it is healing.  Do not use heating pads or hot water bottles. They may burn your skin. If you have lost feeling in your feet or legs, you may not know it is happening until it is too late.  Make sure your health care provider performs a complete foot exam at least annually or more often if you have foot problems. Report any cuts, sores, or bruises to your health care provider immediately. SEEK MEDICAL CARE IF:   You have an injury that is not healing.  You have cuts or breaks in the skin.  You have an ingrown nail.  You notice redness on your legs or feet.  You feel burning or tingling in your legs or feet.  You have pain or cramps in your legs and feet.  Your legs or feet are numb.  Your feet always feel cold. SEEK IMMEDIATE MEDICAL CARE IF:   There is increasing redness,   swelling, or pain in or around a wound.  There is a red line that goes up your leg.  Pus is coming from a wound.  You develop a fever or as directed by your health care provider.  You notice a bad smell coming from an ulcer or wound. Document Released: 11/12/2000 Document Revised: 07/18/2013 Document Reviewed: 04/24/2013 [employer] Patient Information 2014 [employer].  

## 2014-04-09 ENCOUNTER — Ambulatory Visit (INDEPENDENT_AMBULATORY_CARE_PROVIDER_SITE_OTHER): Payer: Medicare Other | Admitting: Obstetrics & Gynecology

## 2014-04-09 ENCOUNTER — Encounter: Payer: Self-pay | Admitting: Obstetrics & Gynecology

## 2014-04-09 ENCOUNTER — Telehealth: Payer: Self-pay | Admitting: Family Medicine

## 2014-04-09 VITALS — BP 122/67 | HR 73 | Resp 16 | Ht 64.0 in | Wt 218.0 lb

## 2014-04-09 DIAGNOSIS — C549 Malignant neoplasm of corpus uteri, unspecified: Secondary | ICD-10-CM

## 2014-04-09 DIAGNOSIS — C541 Malignant neoplasm of endometrium: Secondary | ICD-10-CM

## 2014-04-09 LAB — CMP14+EGFR
ALBUMIN: 4.3 g/dL (ref 3.5–4.8)
ALK PHOS: 43 IU/L (ref 39–117)
ALT: 16 IU/L (ref 0–32)
AST: 20 IU/L (ref 0–40)
Albumin/Globulin Ratio: 2.2 (ref 1.1–2.5)
BUN/Creatinine Ratio: 19 (ref 11–26)
BUN: 22 mg/dL (ref 8–27)
CALCIUM: 9.2 mg/dL (ref 8.7–10.3)
CHLORIDE: 102 mmol/L (ref 97–108)
CO2: 23 mmol/L (ref 18–29)
Creatinine, Ser: 1.13 mg/dL — ABNORMAL HIGH (ref 0.57–1.00)
GFR calc Af Amer: 53 mL/min/{1.73_m2} — ABNORMAL LOW (ref >59)
GFR calc non Af Amer: 46 mL/min/{1.73_m2} — ABNORMAL LOW (ref >59)
Globulin, Total: 2 g/dL (ref 1.5–4.5)
Glucose: 162 mg/dL — ABNORMAL HIGH (ref 65–99)
Potassium: 4.3 mmol/L (ref 3.5–5.2)
Sodium: 140 mmol/L (ref 134–144)
Total Bilirubin: 0.5 mg/dL (ref 0.0–1.2)
Total Protein: 6.3 g/dL (ref 6.0–8.5)

## 2014-04-09 LAB — NMR, LIPOPROFILE
CHOLESTEROL: 219 mg/dL — AB (ref 100–199)
HDL CHOLESTEROL BY NMR: 48 mg/dL (ref >39)
HDL Particle Number: 30.6 umol/L (ref >=30.5)
LDL Particle Number: 1865 nmol/L — ABNORMAL HIGH (ref <1000)
LDL Size: 20.9 nm (ref >20.5)
LDLC SERPL CALC-MCNC: 131 mg/dL — AB (ref 0–99)
LP-IR SCORE: 59 — AB (ref <=45)
Small LDL Particle Number: 930 nmol/L — ABNORMAL HIGH (ref <=527)
Triglycerides by NMR: 201 mg/dL — ABNORMAL HIGH (ref 0–149)

## 2014-04-09 NOTE — Telephone Encounter (Signed)
Message copied by Waverly Ferrari on Tue Apr 09, 2014 10:46 AM ------      Message from: Chevis Pretty      Created: Tue Apr 09, 2014 10:21 AM       microalbumin is negative      Hgba1c discussed at appointment      Kidney and liver function stable      LDL particle numbers look terrible- refuses statin- increased risk of stroke or heart attack- will try welchol to help with cholesterol?      Continue current meds- low fat diet and exercise and recheck in 3 months       ------

## 2014-04-09 NOTE — Patient Instructions (Signed)
Uterine Cancer Uterine cancer is an abnormal growth of tissue (tumor) in the uterus that is cancerous (malignant). Unlike noncancerous (benign) tumors, malignant tumors can spread to other parts of your body. The wall of the uterus has two layers of tissue. The inner layer is the endometrium. The outer layer of muscle tissue is the myometrium. The most common type of uterine cancer begins in the endometrium. This is called endometrial cancer. Cancer that begins in the myometrium is called uterine sarcoma, which is very rare.  RISK FACTORS  Although the exact cause of uterine cancer is unknown, there are a number of risk factors that can increase your chances of getting uterine cancer. They include:  Your age. Uterine cancer occurs mostly in women older than 50 years.   Having an enlarged endometrium (endometrial hyperplasia).   Using hormone therapy.   Obesity.   Taking the drug tamoxifen.   White race.   Infertility.   Never being pregnant.   Beginning menstrual periods at an age younger than 12 years.   Having menstrual periods at an age older than 99 years.   Personal history of ovarian, intestinal, or colorectal cancer.   Having a family history of uterine cancer.   Having a family history of hereditary nonpolyposis colon cancer (HNPCC).   Having diabetes, high blood pressure, thyroid disease, or gallbladder disease.   Long-term use of high-dose birth control pills.   Exposure to radiation.   Smoking.  SIGNS AND SYMPTOMS   Abnormal vaginal bleeding or discharge. Bleeding may start as a watery, blood-streaked flow that gradually contains more blood.   Any vaginal bleeding after menopause.   Difficult or painful urination.   Pain during intercourse.   Pain in the pelvic area.  Mass in the vagina.  Pain or fullness in the abdomen.  Frequent urination.  Bleeding between periods.  Growth of the stomach.   Unexplained weight loss.   Uterine cancer usually occurs after menopause. However, it may also occur around the time that menopause begins. Abnormal vaginal bleeding is the most common symptom of uterine cancer. Women should not assume that abnormal vaginal bleeding is part of menopause. DIAGNOSIS  Your health care provider will ask about your medical history. He or she may also perform a number of procedures, such as:  A physical and pelvic exam. Your health care provider will feel your pelvis for any lumps.   Blood and urine tests.   X-rays.   Imaging tests, such as CT scans, ultrasonography, or MRIs.   A hysteroscopy to view the inside of your uterus.   A Pap test to sample cells from the cervix and upper vagina to check for abnormal cells.   Taking a tissue sample (biopsy) from the uterine lining to look for cancer cells.   A dilation and curettage (D&C). This involves stretching (dilation) the cervix and scraping (curettage) the inside lining of the uterus to get a tissue sample. The sample is examined under a microscope to look for cancer cells.  Your cancer will be staged to determine its severity and extent. Staging is a careful attempt to find out the size of the tumor, whether the cancer has spread, and if so, to what parts of the body. You may need to have more tests to determine the stage of your cancer. The test results will help determine what treatment plan is best for you. Cancer stages include:   Stage I The cancer is only found in the uterus.  Stage II The  cancer has spread to the cervix.  Stage III The cancer has spread outside the uterus, but not outside the pelvis. The cancer may have spread to the lymph nodes in the pelvis.  Stage IV The cancer has spread to other parts of the body, such as the bladder or rectum. TREATMENT  Most women with uterine cancer are treated with surgery. This includes removing the uterus, cervix, fallopian tubes, and ovaries (total hysterectomy). Your  lymph nodes near the tumor may also be removed. Some women have radiation, chemotherapy, or hormonal therapy. Other women have a combination of these therapies. HOME CARE INSTRUCTIONS   Only take over-the-counter or prescription medicines as directed by your health care provider.   Maintain a healthy diet.  Exercise regularly.   If you have diabetes, high blood pressure, thyroid disease, or gallbladder disease, follow your health care provider's instructions to keep it under control.   Do not smoke.   Consider joining a support group. This may help you learn to cope with the stress of having uterine cancer.   Seek advice to help you manage treatment side effects.   Keep all follow-up appointments as directed by your health care provider.  SEEK MEDICAL CARE IF:  You have increased stomach or pelvic pain.  You cannot urinate.  You have abnormal bleeding. Document Released: 11/15/2005 Document Revised: 07/18/2013 Document Reviewed: 05/04/2013 [facility] Patient Information 2014 [place].

## 2014-04-09 NOTE — Progress Notes (Signed)
Subjective:     Patient ID: Debra Campbell, female   DOB: Oct 24, 1932, 80 y.o.   MRN: [id number]  HPI G3P2011 No LMP recorded. Patient is postmenopausal. She had a TAH/BSO last year for endometrial cancer 1B gr1 and is doing well.   Past Medical History  Diagnosis Date  . Diabetes mellitus   . Thyroid disease   . Hypertension   . SVD (spontaneous vaginal delivery)     x 3  . Hypothyroidism   . Eczema   . Hyperlipidemia     no meds  . Arthritis     legs, lower back, hands  . History of kidney stones   . Headache(784.0)     hx of  . Cancer     endometrial   Past Surgical History  Procedure Laterality Date  . Lt knee arthroscopy    . Rt total hip replacement    . Ectopic pregnancy surgery    . Laparotomy      for ectopic pregnancy  . Tonsillectomy    . Wisdom tooth extraction    . Joint replacement    . Hysteroscopy w/d&c N/A 04/26/2013    Procedure: DILATATION AND CURETTAGE /HYSTEROSCOPY;  Surgeon: Woodroe Mode, MD;  Location: [facility];  Service: Gynecology;  Laterality: N/A;  . Laparotomy N/A 06/26/2013    Procedure: EXPLORATORY LAPAROTOMY;  Surgeon: Alvino Chapel, MD;  Location: [facility];  Service: Gynecology;  Laterality: N/A;  . Abdominal hysterectomy Bilateral 06/26/2013    Procedure: TOTAL ABDOMINAL HYSTERECTOMY WITH BILATERAL SALPINGO OOPHERECTOMY WITH PELVIC LYMPHADNECTOMY;  Surgeon: Alvino Chapel, MD;  Location: [facility];  Service: Gynecology;  Laterality: Bilateral;  . Back surgery  06/26/2013   Current Outpatient Prescriptions on File Prior to Visit  Medication Sig Dispense Refill  . acetaminophen (TYLENOL) 500 MG tablet Take 500-1,000 mg by mouth every 6 (six) hours as needed for pain.      [name] aspirin 81 MG tablet Take 81 mg by mouth every morning.       . diclofenac (VOLTAREN) 75 MG EC tablet TAKE ONE TABLET BY MOUTH TWICE DAILY  180 tablet  0  . gabapentin (NEURONTIN) 100 MG capsule Take 1 capsule (100 mg total) by mouth 2 (two) times daily.   180 capsule  1  . hydrochlorothiazide (HYDRODIURIL) 25 MG tablet Take 1 tablet (25 mg total) by mouth every morning.  90 tablet  1  . levothyroxine (SYNTHROID) 175 MCG tablet Take 1 tablet (175 mcg total) by mouth daily before breakfast.  90 tablet  1  . losartan (COZAAR) 50 MG tablet Take 1 tablet (50 mg total) by mouth at bedtime.  90 tablet  1  . metFORMIN (GLUCOPHAGE) 500 MG tablet Take 1 tablet (500 mg total) by mouth 2 (two) times daily with a meal.  180 tablet  1  . Multiple Vitamin (MULTIVITAMIN WITH MINERALS) TABS Take 1 tablet by mouth daily.      [name] DELICA LANCETS 00X MISC TEST ONCE A DAY AND AS NEEDED  100 each  0  . PRESCRIPTION MEDICATION Apply 1 application topically 2 (two) times daily as needed (to itchy skin). Triamcinolone 0.1% / eucerine cream      . [DISCONTINUED] Pitavastatin Calcium (LIVALO) 1 MG TABS Take 1 tablet by mouth daily.         No current facility-administered medications on file prior to visit.   Allergies  Allergen Reactions  . Ace Inhibitors Cough  . Iohexol  Desc: HIVES 40 YEARS AGO   . Statins     myalgia  . Iodine Rash  . Penicillins Rash  . Sulfa Antibiotics Rash      Review of Systems  Constitutional: Negative.   Respiratory: Negative.   Genitourinary: Negative for vaginal bleeding, vaginal discharge, difficulty urinating and pelvic pain.       Objective:   Physical Exam  Constitutional: She is oriented to person, place, and time. She appears well-developed. No distress.  Pulmonary/Chest: Effort normal. No respiratory distress.  Genitourinary: Vagina normal.  Cuff intact no lesions  Neurological: She is alert and oriented to person, place, and time.  Skin: Skin is warm and dry.  Psychiatric: She has a normal mood and affect. Her behavior is normal.       Assessment:    Doing well postoperative surgery for endometrial cancer. Pap is not recommended.    Plan:     She will see Dr. Aldean Ast in 6 months.  Woodroe Mode, MD 04/09/2014

## 2014-04-10 NOTE — Telephone Encounter (Signed)
Patient aware and refuses any medication

## 2014-05-17 NOTE — Telephone Encounter (Signed)
done

## 2014-06-05 ENCOUNTER — Other Ambulatory Visit: Payer: Self-pay | Admitting: Nurse Practitioner

## 2014-07-04 ENCOUNTER — Other Ambulatory Visit: Payer: Self-pay | Admitting: Nurse Practitioner

## 2014-07-11 ENCOUNTER — Ambulatory Visit (INDEPENDENT_AMBULATORY_CARE_PROVIDER_SITE_OTHER): Payer: Medicare Other | Admitting: Nurse Practitioner

## 2014-07-11 ENCOUNTER — Encounter: Payer: Self-pay | Admitting: Nurse Practitioner

## 2014-07-11 VITALS — BP 111/63 | HR 85 | Temp 97.1°F | Ht 64.0 in | Wt 210.8 lb

## 2014-07-11 DIAGNOSIS — I10 Essential (primary) hypertension: Secondary | ICD-10-CM | POA: Diagnosis not present

## 2014-07-11 DIAGNOSIS — Z6836 Body mass index (BMI) 36.0-36.9, adult: Secondary | ICD-10-CM | POA: Diagnosis not present

## 2014-07-11 DIAGNOSIS — E1142 Type 2 diabetes mellitus with diabetic polyneuropathy: Secondary | ICD-10-CM

## 2014-07-11 DIAGNOSIS — E1149 Type 2 diabetes mellitus with other diabetic neurological complication: Secondary | ICD-10-CM | POA: Diagnosis not present

## 2014-07-11 DIAGNOSIS — Z713 Dietary counseling and surveillance: Secondary | ICD-10-CM | POA: Diagnosis not present

## 2014-07-11 DIAGNOSIS — E785 Hyperlipidemia, unspecified: Secondary | ICD-10-CM

## 2014-07-11 DIAGNOSIS — E038 Other specified hypothyroidism: Secondary | ICD-10-CM

## 2014-07-11 DIAGNOSIS — L259 Unspecified contact dermatitis, unspecified cause: Secondary | ICD-10-CM

## 2014-07-11 DIAGNOSIS — E034 Atrophy of thyroid (acquired): Secondary | ICD-10-CM

## 2014-07-11 DIAGNOSIS — E0789 Other specified disorders of thyroid: Secondary | ICD-10-CM

## 2014-07-11 DIAGNOSIS — L309 Dermatitis, unspecified: Secondary | ICD-10-CM

## 2014-07-11 LAB — POCT GLYCOSYLATED HEMOGLOBIN (HGB A1C): Hemoglobin A1C: 6.8

## 2014-07-11 MED ORDER — GABAPENTIN 100 MG PO CAPS: ORAL_CAPSULE | ORAL | Status: DC

## 2014-07-11 MED ORDER — DICLOFENAC SODIUM 75 MG PO TBEC: DELAYED_RELEASE_TABLET | ORAL | Status: DC

## 2014-07-11 MED ORDER — METFORMIN HCL ER 750 MG PO TB24: 750.0000 mg | ORAL_TABLET | Freq: Every day | ORAL | Status: DC

## 2014-07-11 MED ORDER — LEVOTHYROXINE SODIUM 175 MCG PO TABS: 175.0000 ug | ORAL_TABLET | Freq: Every day | ORAL | Status: DC

## 2014-07-11 MED ORDER — HYDROCHLOROTHIAZIDE 25 MG PO TABS: 25.0000 mg | ORAL_TABLET | Freq: Every morning | ORAL | Status: DC

## 2014-07-11 MED ORDER — TRIAMCINOLONE 0.1 % CREAM:EUCERIN CREAM 1:1: 1.0000 "application " | TOPICAL_CREAM | Freq: Two times a day (BID) | CUTANEOUS | Status: DC

## 2014-07-11 MED ORDER — LOSARTAN POTASSIUM 50 MG PO TABS: ORAL_TABLET | ORAL | Status: DC

## 2014-07-11 NOTE — Patient Instructions (Signed)
Diabetes and Foot Care Diabetes may cause you to have problems because of poor blood supply (circulation) to your feet and legs. This may cause the skin on your feet to become thinner, break easier, and heal more slowly. Your skin may become dry, and the skin may peel and crack. You may also have nerve damage in your legs and feet causing decreased feeling in them. You may not notice minor injuries to your feet that could lead to infections or more serious problems. Taking care of your feet is one of the most important things you can do for yourself.  HOME CARE INSTRUCTIONS  Wear shoes at all times, even in the house. Do not go barefoot. Bare feet are easily injured.  Check your feet daily for blisters, cuts, and redness. If you cannot see the bottom of your feet, use a mirror or ask someone for help.  Wash your feet with warm water (do not use hot water) and mild soap. Then pat your feet and the areas between your toes until they are completely dry. Do not soak your feet as this can dry your skin.  Apply a moisturizing lotion or petroleum jelly (that does not contain alcohol and is unscented) to the skin on your feet and to dry, brittle toenails. Do not apply lotion between your toes.  Trim your toenails straight across. Do not dig under them or around the cuticle. File the edges of your nails with an emery board or nail file.  Do not cut corns or calluses or try to remove them with medicine.  Wear clean socks or stockings every day. Make sure they are not too tight. Do not wear knee-high stockings since they may decrease blood flow to your legs.  Wear shoes that fit properly and have enough cushioning. To break in new shoes, wear them for just a few hours a day. This prevents you from injuring your feet. Always look in your shoes before you put them on to be sure there are no objects inside.  Do not cross your legs. This may decrease the blood flow to your feet.  If you find a minor scrape,  cut, or break in the skin on your feet, keep it and the skin around it clean and dry. These areas may be cleansed with mild soap and water. Do not cleanse the area with peroxide, alcohol, or iodine.  When you remove an adhesive bandage, be sure not to damage the skin around it.  If you have a wound, look at it several times a day to make sure it is healing.  Do not use heating pads or hot water bottles. They may burn your skin. If you have lost feeling in your feet or legs, you may not know it is happening until it is too late.  Make sure your health care provider performs a complete foot exam at least annually or more often if you have foot problems. Report any cuts, sores, or bruises to your health care provider immediately. SEEK MEDICAL CARE IF:   You have an injury that is not healing.  You have cuts or breaks in the skin.  You have an ingrown nail.  You notice redness on your legs or feet.  You feel burning or tingling in your legs or feet.  You have pain or cramps in your legs and feet.  Your legs or feet are numb.  Your feet always feel cold. SEEK IMMEDIATE MEDICAL CARE IF:   There is increasing redness,   swelling, or pain in or around a wound.  There is a red line that goes up your leg.  Pus is coming from a wound.  You develop a fever or as directed by your health care provider.  You notice a bad smell coming from an ulcer or wound. Document Released: 11/12/2000 Document Revised: 07/18/2013 Document Reviewed: 04/24/2013 [employer] Patient Information 2015 [employer]. This information is not intended to replace advice given to you by your health care provider. Make sure you discuss any questions you have with your health care provider.  

## 2014-07-11 NOTE — Addendum Note (Signed)
Addended by: Chevis Pretty on: 07/11/2014 11:29 AM   Modules accepted: Orders

## 2014-07-11 NOTE — Progress Notes (Signed)
Subjective:    Patient ID: Debra Campbell, female    DOB: 1931/12/17, 80 y.o.   MRN: [id number]  Patient here today for follow up of chronic medical problems.- having abdominal pain with diarrhea for the past 3 months with hemorrhoids.  Has had chronic diarrhea in the past with a normal colonoscopy 2010.     Diabetes She has type 2 diabetes mellitus. No MedicAlert identification noted. The initial diagnosis of diabetes was made 10 years ago. Her disease course has been stable. Pertinent negatives for diabetes include no blurred vision, no foot paresthesias, no foot ulcerations, no visual change and no weight loss. There are no hypoglycemic complications. Symptoms are stable. Risk factors for coronary artery disease include dyslipidemia, obesity and post-menopausal. Current diabetic treatment includes oral agent (monotherapy). She is compliant with treatment all of the time. Her weight is stable. She is following a generally healthy diet. When asked about meal planning, she reported none. She has not had a previous visit with a dietician. She rarely participates in exercise. There is no change in her home blood glucose trend. Her breakfast blood glucose is taken between 7-8 am. Her breakfast blood glucose range is generally 130-140 mg/dl. Her overall blood glucose range is 130-140 mg/dl. An ACE inhibitor/angiotensin II receptor blocker is not being taken. She does not see a podiatrist.Eye exam is not current (At least 2 years).  Hypertension This is a chronic problem. The current episode started more than 1 year ago. The problem is unchanged. The problem is controlled. Pertinent negatives include no blurred vision or peripheral edema. There are no associated agents to hypertension. Risk factors for coronary artery disease include diabetes mellitus, dyslipidemia and post-menopausal state. Past treatments include diuretics. The current treatment provides significant improvement. Compliance problems include  diet and exercise.   Hyperlipidemia This is a chronic problem. The current episode started more than 1 year ago. The problem is controlled. Recent lipid tests were reviewed and are variable. Exacerbating diseases include diabetes and obesity. Pertinent negatives include no leg pain. She is currently on no antihyperlipidemic treatment (Refuses statin- Makes her hurt). The current treatment provides no improvement of lipids. Compliance problems include adherence to exercise and adherence to diet.  Risk factors for coronary artery disease include post-menopausal and diabetes mellitus.  Hypothyroidism Chronic problem. Patient taking Synthroid without complications. No c/o fatigue.  Diabetic neuropathy Gabapentin working but would like to increase dose. Decreased dose back to 172m BID- said taht 3088mdose made her dizzy osteopenia  Last bone density test was 02/06/14- stable.   Review of Systems  Constitutional: Negative for weight loss.  Eyes: Negative for blurred vision.  Respiratory: Negative.   Cardiovascular: Negative.   Gastrointestinal: Positive for abdominal pain and diarrhea.       For the past 3 months  Skin: Negative.   All other systems reviewed and are negative.      Objective:   Physical Exam  Constitutional: She is oriented to person, place, and time. She appears well-developed and well-nourished.  HENT:  Nose: Nose normal.  Mouth/Throat: Oropharynx is clear and moist.  Eyes: Conjunctivae and EOM are normal. Pupils are equal, round, and reactive to light.  Neck: Trachea normal and full passive range of motion without pain. Carotid bruit is not present.  Cardiovascular: Normal rate, regular rhythm, normal heart sounds and intact distal pulses.   Pulmonary/Chest: Effort normal and breath sounds normal.  Abdominal: Soft. Bowel sounds are normal.  Neurological: She is alert and oriented to person,  place, and time. She has normal reflexes.  (+) 4/4 monofilament bil feet    Skin: Skin is warm and dry.  Mild callus formation bil heels  Psychiatric: She has a normal mood and affect. Her behavior is normal. Judgment and thought content normal.    BP 111/63  Pulse 85  Temp(Src) 97.1 F (36.2 C) (Oral)  Ht 5' 4"  (1.626 m)  Wt 210 lb 12.8 oz (95.618 kg)  BMI 36.17 kg/m2 Results for orders placed in visit on 07/11/14  POCT GLYCOSYLATED HEMOGLOBIN (HGB A1C)      Result Value Ref Range   Hemoglobin A1C 6.8%           Assessment & Plan:   1. Essential hypertension   2. Hyperlipidemia   3. BMI 36.0-36.9,adult   4. Weight loss counseling, encounter for   5. Hypothyroidism due to acquired atrophy of thyroid   6. Eczema   7. Type II or unspecified type diabetes mellitus with neurological manifestations, not stated as uncontrolled   8. Diabetic polyneuropathy associated with type 2 diabetes mellitus    Orders Placed This Encounter  Procedures  . CMP14+EGFR  . NMR, lipoprofile  . POCT glycosylated hemoglobin (Hb A1C)   Meds ordered this encounter  Medications  . Triamcinolone Acetonide (TRIAMCINOLONE 0.1 % CREAM : EUCERIN) CREA    Sig: Apply 1 application topically 2 (two) times daily.    Dispense:  60 each    Refill:  2    Order Specific Question:  Supervising Provider    Answer:  Chipper Herb [[id number]]  . metFORMIN (GLUCOPHAGE XR) 750 MG 24 hr tablet    Sig: Take 1 tablet (750 mg total) by mouth daily with breakfast.    Dispense:  90 tablet    Refill:  1    Order Specific Question:  Supervising Provider    Answer:  Chipper Herb [[id number]]  . losartan (COZAAR) 50 MG tablet    Sig: TAKE 1 TABLET (50 MG TOTAL) BY MOUTH AT BEDTIME.    Dispense:  90 tablet    Refill:  1    Order Specific Question:  Supervising Provider    Answer:  Chipper Herb [[id number]]  . gabapentin (NEURONTIN) 100 MG capsule    Sig: TAKE 1 CAPSULE (100 MG TOTAL) BY MOUTH 2 (TWO) TIMES DAILY.    Dispense:  180 capsule    Refill:  1    Order Specific Question:  Supervising  Provider    Answer:  Chipper Herb [[id number]]  . diclofenac (VOLTAREN) 75 MG EC tablet    Sig: TAKE ONE TABLET BY MOUTH TWICE DAILY    Dispense:  180 tablet    Refill:  0    Order Specific Question:  Supervising Provider    Answer:  Chipper Herb [[id number]]  . levothyroxine (SYNTHROID) 175 MCG tablet    Sig: Take 1 tablet (175 mcg total) by mouth daily before breakfast.    Dispense:  90 tablet    Refill:  1    Order Specific Question:  Supervising Provider    Answer:  Chipper Herb [[id number]]  . hydrochlorothiazide (HYDRODIURIL) 25 MG tablet    Sig: Take 1 tablet (25 mg total) by mouth every morning.    Dispense:  90 tablet    Refill:  1    Order Specific Question:  Supervising Provider    Answer:  Chipper Herb [[id number]]   Changed metformin 500 BiD to metformin 750 XR 1 x a  day ti see if will help with diarrhea Labs pending Health maintenance reviewed Diet and exercise encouraged Continue all meds Follow up  In 3 months   [place], FNP

## 2014-07-12 ENCOUNTER — Telehealth: Payer: Self-pay | Admitting: Family Medicine

## 2014-07-12 ENCOUNTER — Other Ambulatory Visit: Payer: Self-pay | Admitting: Nurse Practitioner

## 2014-07-12 LAB — NMR, LIPOPROFILE
CHOLESTEROL: 193 mg/dL (ref 100–199)
HDL Cholesterol by NMR: 46 mg/dL (ref >39)
HDL Particle Number: 28.5 umol/L — ABNORMAL LOW (ref >=30.5)
LDL PARTICLE NUMBER: 1614 nmol/L — AB (ref <1000)
LDL SIZE: 21.2 nm (ref >20.5)
LDLC SERPL CALC-MCNC: 122 mg/dL — AB (ref 0–99)
LP-IR SCORE: 46 — AB (ref <=45)
Small LDL Particle Number: 690 nmol/L — ABNORMAL HIGH (ref <=527)
Triglycerides by NMR: 126 mg/dL (ref 0–149)

## 2014-07-12 LAB — CMP14+EGFR
ALK PHOS: 74 IU/L (ref 39–117)
ALT: 42 IU/L — AB (ref 0–32)
AST: 25 IU/L (ref 0–40)
Albumin/Globulin Ratio: 1.4 (ref 1.1–2.5)
Albumin: 3.5 g/dL (ref 3.5–4.7)
BILIRUBIN TOTAL: 0.4 mg/dL (ref 0.0–1.2)
BUN / CREAT RATIO: 19 (ref 11–26)
BUN: 30 mg/dL — ABNORMAL HIGH (ref 8–27)
CO2: 23 mmol/L (ref 18–29)
Calcium: 9.5 mg/dL (ref 8.7–10.3)
Chloride: 99 mmol/L (ref 97–108)
Creatinine, Ser: 1.59 mg/dL — ABNORMAL HIGH (ref 0.57–1.00)
GFR calc non Af Amer: 30 mL/min/{1.73_m2} — ABNORMAL LOW (ref >59)
GFR, EST AFRICAN AMERICAN: 35 mL/min/{1.73_m2} — AB (ref >59)
Globulin, Total: 2.5 g/dL (ref 1.5–4.5)
Glucose: 184 mg/dL — ABNORMAL HIGH (ref 65–99)
POTASSIUM: 4.5 mmol/L (ref 3.5–5.2)
SODIUM: 140 mmol/L (ref 134–144)
Total Protein: 6 g/dL (ref 6.0–8.5)

## 2014-07-12 NOTE — Telephone Encounter (Signed)
Message copied by Waverly Ferrari on Fri Jul 12, 2014  3:32 PM ------      Message from: Chevis Pretty      Created: Fri Jul 12, 2014  2:02 PM       Hgba1c discussed at appointment      Creatune increasing- Stop Voltaren immediately- avoid all NSAIDS- rechc in 1 month      Liver normal      ldl are elevated- need to be on a statin- which ones has she tried in the past and what problem did they cause?       ------

## 2014-07-15 ENCOUNTER — Other Ambulatory Visit: Payer: Self-pay | Admitting: Nurse Practitioner

## 2014-07-15 MED ORDER — PITAVASTATIN CALCIUM 4 MG PO TABS: 1.0000 | ORAL_TABLET | Freq: Every day | ORAL | Status: DC

## 2014-07-15 NOTE — Telephone Encounter (Signed)
Patient aware of results and will recheck kidney functions in 1 mth

## 2014-07-17 LAB — HM DIABETES EYE EXAM

## 2014-07-23 ENCOUNTER — Telehealth: Payer: Self-pay | Admitting: Nurse Practitioner

## 2014-07-23 MED ORDER — SITZ BATH MISC: 1.0000 | Status: DC | PRN

## 2014-07-23 MED ORDER — HYDROCORTISONE 2.5 % RE CREA: 1.0000 "application " | TOPICAL_CREAM | Freq: Two times a day (BID) | RECTAL | Status: DC

## 2014-07-23 NOTE — Telephone Encounter (Signed)
rx sent

## 2014-07-24 NOTE — Telephone Encounter (Signed)
Pt notified Rx done

## 2014-08-01 ENCOUNTER — Encounter: Payer: Self-pay | Admitting: *Deleted

## 2014-08-08 ENCOUNTER — Telehealth: Payer: Self-pay | Admitting: Family Medicine

## 2014-08-15 ENCOUNTER — Other Ambulatory Visit (INDEPENDENT_AMBULATORY_CARE_PROVIDER_SITE_OTHER): Payer: Medicare Other

## 2014-08-15 DIAGNOSIS — R7989 Other specified abnormal findings of blood chemistry: Secondary | ICD-10-CM | POA: Diagnosis not present

## 2014-08-15 NOTE — Progress Notes (Signed)
LAB ONLY 

## 2014-08-16 LAB — BMP8+EGFR
BUN/Creatinine Ratio: 15 (ref 11–26)
BUN: 16 mg/dL (ref 8–27)
CO2: 24 mmol/L (ref 18–29)
Calcium: 9.5 mg/dL (ref 8.7–10.3)
Chloride: 98 mmol/L (ref 97–108)
Creatinine, Ser: 1.08 mg/dL — ABNORMAL HIGH (ref 0.57–1.00)
GFR calc Af Amer: 56 mL/min/{1.73_m2} — ABNORMAL LOW (ref >59)
GFR calc non Af Amer: 49 mL/min/{1.73_m2} — ABNORMAL LOW (ref >59)
GLUCOSE: 173 mg/dL — AB (ref 65–99)
Potassium: 4.1 mmol/L (ref 3.5–5.2)
Sodium: 140 mmol/L (ref 134–144)

## 2014-09-09 ENCOUNTER — Ambulatory Visit (INDEPENDENT_AMBULATORY_CARE_PROVIDER_SITE_OTHER): Payer: Medicare Other | Admitting: Family

## 2014-09-09 ENCOUNTER — Encounter: Payer: Self-pay | Admitting: Family

## 2014-09-09 ENCOUNTER — Ambulatory Visit (INDEPENDENT_AMBULATORY_CARE_PROVIDER_SITE_OTHER): Payer: Medicare Other

## 2014-09-09 VITALS — BP 137/72 | HR 82 | Temp 98.1°F | Ht 64.0 in | Wt 209.4 lb

## 2014-09-09 DIAGNOSIS — K649 Unspecified hemorrhoids: Secondary | ICD-10-CM

## 2014-09-09 DIAGNOSIS — M25511 Pain in right shoulder: Secondary | ICD-10-CM

## 2014-09-09 DIAGNOSIS — Z23 Encounter for immunization: Secondary | ICD-10-CM | POA: Diagnosis not present

## 2014-09-09 DIAGNOSIS — M19011 Primary osteoarthritis, right shoulder: Secondary | ICD-10-CM

## 2014-09-09 DIAGNOSIS — M1990 Unspecified osteoarthritis, unspecified site: Secondary | ICD-10-CM | POA: Diagnosis not present

## 2014-09-09 DIAGNOSIS — R5383 Other fatigue: Secondary | ICD-10-CM | POA: Diagnosis not present

## 2014-09-09 MED ORDER — HYDROCORTISONE ACETATE 25 MG RE SUPP: 25.0000 mg | Freq: Two times a day (BID) | RECTAL | Status: DC

## 2014-09-09 MED ORDER — DICLOFENAC SODIUM 1 % TD GEL: 4.0000 g | Freq: Four times a day (QID) | TRANSDERMAL | Status: DC

## 2014-09-09 MED ORDER — TRAMADOL HCL 50 MG PO TABS: 50.0000 mg | ORAL_TABLET | Freq: Three times a day (TID) | ORAL | Status: DC | PRN

## 2014-09-09 NOTE — Patient Instructions (Signed)

## 2014-09-09 NOTE — Progress Notes (Signed)
   Subjective:    Patient ID: Debra Campbell, female    DOB: June 23, 1932, 80 y.o.   MRN: [id number]  Shoulder Pain  The pain is present in the right shoulder. This is a new problem. The current episode started 1 to 4 weeks ago. There has been no history of extremity trauma. The problem occurs constantly. The problem has been unchanged. The quality of the pain is described as aching. The pain is at a severity of 7/10. The pain is moderate. Associated symptoms include an inability to bear weight and a limited range of motion. Pertinent negatives include no joint locking, joint swelling, numbness, stiffness or tingling. The symptoms are aggravated by activity. She has tried acetaminophen for the symptoms. The treatment provided mild relief.      Review of Systems  Constitutional: Negative.   HENT: Negative.   Eyes: Negative.   Respiratory: Negative.  Negative for shortness of breath.   Cardiovascular: Negative.  Negative for palpitations.  Gastrointestinal: Negative.   Endocrine: Negative.   Genitourinary: Negative.   Musculoskeletal: Negative.  Negative for stiffness.  Neurological: Negative.  Negative for tingling, numbness and headaches.  Hematological: Negative.   Psychiatric/Behavioral: Negative.   All other systems reviewed and are negative.      Objective:   Physical Exam  Vitals reviewed. Constitutional: She is oriented to person, place, and time. She appears well-developed and well-nourished. No distress.  Eyes: Pupils are equal, round, and reactive to light.  Neck: Normal range of motion. Neck supple. No thyromegaly present.  Cardiovascular: Normal rate, regular rhythm, normal heart sounds and intact distal pulses.   No murmur heard. Pulmonary/Chest: Effort normal and breath sounds normal. No respiratory distress. She has no wheezes.  Abdominal: Soft. Bowel sounds are normal. She exhibits no distension. There is no tenderness.  Musculoskeletal: She exhibits no edema and no  tenderness.  Limited ROM of right shoulder with elevation  Neurological: She is alert and oriented to person, place, and time. She has normal reflexes. No cranial nerve deficit.  Skin: Skin is warm and dry.  Psychiatric: She has a normal mood and affect. Her behavior is normal. Judgment and thought content normal.      BP 137/72  Pulse 82  Temp(Src) 98.1 F (36.7 C) (Oral)  Ht $R'5\' 4"'jk$  (1.626 m)  Wt 209 lb 6.4 oz (94.983 kg)  BMI 35.93 kg/m2     Assessment & Plan:  1. Right shoulder pain - DG Shoulder Right; Future - diclofenac sodium (VOLTAREN) 1 % GEL; Apply 4 g topically 4 (four) times daily.  Dispense: 100 g; Refill: 6  2. Hemorrhoids, unspecified hemorrhoid type -Stool softener  - hydrocortisone (ANUSOL-HC) 25 MG suppository; Place 1 suppository (25 mg total) rectally 2 (two) times daily.  Dispense: 12 suppository; Refill: 0  3. Osteoarthritis of right shoulder, unspecified osteoarthritis type -Rest -Ice and heat if helps - diclofenac sodium (VOLTAREN) 1 % GEL; Apply 4 g topically 4 (four) times daily.  Dispense: 100 g; Refill: 6 - traMADol (ULTRAM) 50 MG tablet; Take 1 tablet (50 mg total) by mouth every 8 (eight) hours as needed.  Dispense: 30 tablet; Refill: 1 - Arthritis Panel - BMP8+EGFR  Evelina Dun, FNP

## 2014-09-10 ENCOUNTER — Other Ambulatory Visit: Payer: Self-pay | Admitting: Family

## 2014-09-10 DIAGNOSIS — R768 Other specified abnormal immunological findings in serum: Secondary | ICD-10-CM

## 2014-09-10 LAB — ARTHRITIS PANEL
BASOS: 0 %
Basophils Absolute: 0 10*3/uL (ref 0.0–0.2)
EOS: 4 %
Eosinophils Absolute: 0.3 10*3/uL (ref 0.0–0.4)
HCT: 42.2 % (ref 34.0–46.6)
HEMOGLOBIN: 14 g/dL (ref 11.1–15.9)
IMMATURE GRANS (ABS): 0 10*3/uL (ref 0.0–0.1)
Immature Granulocytes: 0 %
LYMPHS: 33 %
Lymphocytes Absolute: 2.4 10*3/uL (ref 0.7–3.1)
MCH: 30 pg (ref 26.6–33.0)
MCHC: 33.2 g/dL (ref 31.5–35.7)
MCV: 91 fL (ref 79–97)
MONOCYTES: 8 %
Monocytes Absolute: 0.6 10*3/uL (ref 0.1–0.9)
NEUTROS PCT: 55 %
Neutrophils Absolute: 4.2 10*3/uL (ref 1.4–7.0)
Platelets: 211 10*3/uL (ref 150–379)
RBC: 4.66 x10E6/uL (ref 3.77–5.28)
RDW: 14 % (ref 12.3–15.4)
RHEUMATOID FACTOR: 14.9 [IU]/mL — AB (ref 0.0–13.9)
SED RATE: 18 mm/h (ref 0–40)
URIC ACID: 5.4 mg/dL (ref 2.5–7.1)
WBC: 7.5 10*3/uL (ref 3.4–10.8)

## 2014-09-10 LAB — BMP8+EGFR
BUN/Creatinine Ratio: 16 (ref 11–26)
BUN: 16 mg/dL (ref 8–27)
CALCIUM: 10.4 mg/dL — AB (ref 8.7–10.3)
CO2: 24 mmol/L (ref 18–29)
CREATININE: 1 mg/dL (ref 0.57–1.00)
Chloride: 97 mmol/L (ref 97–108)
GFR calc Af Amer: 61 mL/min/{1.73_m2} (ref >59)
GFR calc non Af Amer: 53 mL/min/{1.73_m2} — ABNORMAL LOW (ref >59)
Glucose: 124 mg/dL — ABNORMAL HIGH (ref 65–99)
Potassium: 4.1 mmol/L (ref 3.5–5.2)
Sodium: 141 mmol/L (ref 134–144)

## 2014-09-11 ENCOUNTER — Telehealth: Payer: Self-pay | Admitting: Family Medicine

## 2014-09-11 NOTE — Telephone Encounter (Signed)
Message copied by Waverly Ferrari on Wed Sep 11, 2014  2:11 PM ------      Message from: [place], [place] A      Created: Tue Sep 10, 2014  2:54 PM       Rheumatoid factor elevated- Referral sent to rheumatology      Kidney  function stable       ------

## 2014-09-25 DIAGNOSIS — M19042 Primary osteoarthritis, left hand: Secondary | ICD-10-CM | POA: Diagnosis not present

## 2014-09-25 DIAGNOSIS — M159 Polyosteoarthritis, unspecified: Secondary | ICD-10-CM | POA: Diagnosis not present

## 2014-09-25 DIAGNOSIS — M545 Low back pain: Secondary | ICD-10-CM | POA: Diagnosis not present

## 2014-09-25 DIAGNOSIS — M170 Bilateral primary osteoarthritis of knee: Secondary | ICD-10-CM | POA: Diagnosis not present

## 2014-09-25 DIAGNOSIS — M19041 Primary osteoarthritis, right hand: Secondary | ICD-10-CM | POA: Diagnosis not present

## 2014-09-25 DIAGNOSIS — R799 Abnormal finding of blood chemistry, unspecified: Secondary | ICD-10-CM | POA: Diagnosis not present

## 2014-09-25 DIAGNOSIS — M7531 Calcific tendinitis of right shoulder: Secondary | ICD-10-CM | POA: Diagnosis not present

## 2014-09-25 DIAGNOSIS — M4806 Spinal stenosis, lumbar region: Secondary | ICD-10-CM | POA: Diagnosis not present

## 2014-09-30 ENCOUNTER — Encounter: Payer: Self-pay | Admitting: Family

## 2014-10-08 ENCOUNTER — Other Ambulatory Visit: Payer: Medicare Other

## 2014-10-08 ENCOUNTER — Other Ambulatory Visit: Payer: Self-pay | Admitting: Nurse Practitioner

## 2014-10-08 ENCOUNTER — Ambulatory Visit (INDEPENDENT_AMBULATORY_CARE_PROVIDER_SITE_OTHER): Payer: Medicare Other

## 2014-10-08 DIAGNOSIS — M48061 Spinal stenosis, lumbar region without neurogenic claudication: Secondary | ICD-10-CM

## 2014-10-08 DIAGNOSIS — M4806 Spinal stenosis, lumbar region: Secondary | ICD-10-CM

## 2014-10-15 DIAGNOSIS — M7531 Calcific tendinitis of right shoulder: Secondary | ICD-10-CM | POA: Diagnosis not present

## 2014-10-15 DIAGNOSIS — M159 Polyosteoarthritis, unspecified: Secondary | ICD-10-CM | POA: Diagnosis not present

## 2014-10-15 DIAGNOSIS — M545 Low back pain: Secondary | ICD-10-CM | POA: Diagnosis not present

## 2014-10-17 ENCOUNTER — Ambulatory Visit: Payer: Medicare Other | Admitting: Nurse Practitioner

## 2014-10-17 ENCOUNTER — Encounter: Payer: Self-pay | Admitting: Nurse Practitioner

## 2014-10-17 ENCOUNTER — Ambulatory Visit (INDEPENDENT_AMBULATORY_CARE_PROVIDER_SITE_OTHER): Payer: Medicare Other | Admitting: Nurse Practitioner

## 2014-10-17 VITALS — BP 125/79 | HR 84 | Temp 97.3°F | Ht 64.0 in | Wt 208.0 lb

## 2014-10-17 DIAGNOSIS — E034 Atrophy of thyroid (acquired): Secondary | ICD-10-CM | POA: Diagnosis not present

## 2014-10-17 DIAGNOSIS — I10 Essential (primary) hypertension: Secondary | ICD-10-CM

## 2014-10-17 DIAGNOSIS — M8580 Other specified disorders of bone density and structure, unspecified site: Secondary | ICD-10-CM

## 2014-10-17 DIAGNOSIS — E785 Hyperlipidemia, unspecified: Secondary | ICD-10-CM | POA: Diagnosis not present

## 2014-10-17 DIAGNOSIS — E1142 Type 2 diabetes mellitus with diabetic polyneuropathy: Secondary | ICD-10-CM

## 2014-10-17 DIAGNOSIS — E038 Other specified hypothyroidism: Secondary | ICD-10-CM | POA: Diagnosis not present

## 2014-10-17 DIAGNOSIS — E119 Type 2 diabetes mellitus without complications: Secondary | ICD-10-CM

## 2014-10-17 LAB — POCT GLYCOSYLATED HEMOGLOBIN (HGB A1C)

## 2014-10-17 LAB — HM DIABETES FOOT EXAM

## 2014-10-17 MED ORDER — LEVOTHYROXINE SODIUM 175 MCG PO TABS: 175.0000 ug | ORAL_TABLET | Freq: Every day | ORAL | Status: DC

## 2014-10-17 MED ORDER — HYDROCHLOROTHIAZIDE 25 MG PO TABS: 25.0000 mg | ORAL_TABLET | Freq: Every morning | ORAL | Status: DC

## 2014-10-17 MED ORDER — METFORMIN HCL ER 750 MG PO TB24: 750.0000 mg | ORAL_TABLET | Freq: Every day | ORAL | Status: DC

## 2014-10-17 MED ORDER — GABAPENTIN 100 MG PO CAPS: ORAL_CAPSULE | ORAL | Status: DC

## 2014-10-17 MED ORDER — LOSARTAN POTASSIUM 50 MG PO TABS: ORAL_TABLET | ORAL | Status: DC

## 2014-10-17 NOTE — Addendum Note (Signed)
Addended by: Chevis Pretty on: 10/17/2014 10:59 AM   Modules accepted: Orders

## 2014-10-17 NOTE — Progress Notes (Addendum)
Subjective:    Patient ID: Debra Campbell, female    DOB: Jul 13, 1932, 80 y.o.   MRN: [id number]  Hypertension This is a chronic problem. The current episode started more than 1 year ago. The problem is unchanged. The problem is controlled. Pertinent negatives include no chest pain, headaches, palpitations, peripheral edema or shortness of breath. Past treatments include angiotensin blockers. The current treatment provides moderate improvement. Compliance problems include diet and exercise.   Hyperlipidemia This is a chronic problem. The current episode started more than 1 year ago. The problem is uncontrolled. Recent lipid tests were reviewed and are high. Exacerbating diseases include hypothyroidism and obesity. She has no history of diabetes. Pertinent negatives include no chest pain, myalgias or shortness of breath. Treatments tried: patient stopped livalo because does  not like statins. The current treatment provides moderate improvement of lipids. Compliance problems include adherence to diet and adherence to exercise.  Risk factors for coronary artery disease include dyslipidemia, hypertension, obesity and post-menopausal.  Diabetes She presents for her follow-up diabetic visit. She has type 2 diabetes mellitus. No MedicAlert identification noted. Pertinent negatives for hypoglycemia include no headaches. Pertinent negatives for diabetes include no chest pain, no visual change and no weakness. Symptoms are stable. Risk factors for coronary artery disease include dyslipidemia, family history, diabetes mellitus, hypertension, obesity and post-menopausal. Current diabetic treatment includes oral agent (monotherapy). She is compliant with treatment most of the time. Her weight is increasing steadily. When asked about meal planning, she reported none. She has not had a previous visit with a dietitian. She never participates in exercise. Her breakfast blood glucose is taken between 8-9 am. Her breakfast  blood glucose range is generally 140-180 mg/dl. Her overall blood glucose range is 140-180 mg/dl. An ACE inhibitor/angiotensin II receptor blocker is being taken. She does not see a podiatrist.Eye exam is current.      Review of Systems  Respiratory: Negative for shortness of breath.   Cardiovascular: Negative for chest pain and palpitations.  Musculoskeletal: Negative for myalgias.  Neurological: Negative for weakness and headaches.       Objective:   Physical Exam  Constitutional: She is oriented to person, place, and time. She appears well-developed and well-nourished.  HENT:  Nose: Nose normal.  Mouth/Throat: Oropharynx is clear and moist.  Eyes: EOM are normal.  Neck: Trachea normal, normal range of motion and full passive range of motion without pain. Neck supple. No JVD present. Carotid bruit is not present. No thyromegaly present.  Cardiovascular: Normal rate, regular rhythm, normal heart sounds and intact distal pulses.  Exam reveals no gallop and no friction rub.   No murmur heard. Pulmonary/Chest: Effort normal and breath sounds normal.  Abdominal: Soft. Bowel sounds are normal. She exhibits no distension and no mass. There is no tenderness.  Musculoskeletal: Normal range of motion.  Lymphadenopathy:    She has no cervical adenopathy.  Neurological: She is alert and oriented to person, place, and time. She has normal reflexes.  Skin: Skin is warm and dry.  Psychiatric: She has a normal mood and affect. Her behavior is normal. Judgment and thought content normal.   BP 125/79 mmHg  Pulse 84  Temp(Src) 97.3 F (36.3 C) (Oral)  Ht 5' 4"  (1.626 m)  Wt 208 lb (94.348 kg)  BMI 35.69 kg/m2  Results for orders placed or performed in visit on 10/17/14  POCT glycosylated hemoglobin (Hb A1C)  Result Value Ref Range   Hemoglobin A1C 7.5%   HM DIABETES EYE  EXAM  Result Value Ref Range   HM Diabetic Eye Exam No Retinopathy No Retinopathy  HM DIABETES FOOT EXAM  Result  Value Ref Range   HM Diabetic Foot Exam neuropathy            Assessment & Plan:  1. Essential hypertension Low NA+ diet - CMP14+EGFR  2. Hyperlipidemia Low fat  - NMR, lipoprofile  3. Type 2 diabetes mellitus without complication Carb counting If HGBA1c doesn't improve with next check will have to add meds - POCT glycosylated hemoglobin (Hb A1C)  4. Hypothyroidism due to acquired atrophy of thyroid  5. Type 2 diabetes mellitus with diabetic polyneuropathy Continue neurotin Diabetic foot care encouraged  6. Osteopenia Bone density UTD  Meds ordered this encounter  Medications  . triamcinolone cream (KENALOG) 0.5 %    Sig: Apply 1 application topically 3 (three) times daily.  [name] gabapentin (NEURONTIN) 100 MG capsule    Sig: TAKE 1 CAPSULE (100 MG TOTAL) BY MOUTH 2 (TWO) TIMES DAILY.    Dispense:  180 capsule    Refill:  1    Order Specific Question:  Supervising Provider    Answer:  Chipper Herb [[id number]]  . hydrochlorothiazide (HYDRODIURIL) 25 MG tablet    Sig: Take 1 tablet (25 mg total) by mouth every morning.    Dispense:  90 tablet    Refill:  1    Order Specific Question:  Supervising Provider    Answer:  Chipper Herb [[id number]]  . losartan (COZAAR) 50 MG tablet    Sig: TAKE 1 TABLET (50 MG TOTAL) BY MOUTH AT BEDTIME.    Dispense:  90 tablet    Refill:  1    Order Specific Question:  Supervising Provider    Answer:  Chipper Herb [[id number]]  . levothyroxine (SYNTHROID) 175 MCG tablet    Sig: Take 1 tablet (175 mcg total) by mouth daily before breakfast.    Dispense:  90 tablet    Refill:  1    Order Specific Question:  Supervising Provider    Answer:  Chipper Herb [[id number]]  . metFORMIN (GLUCOPHAGE XR) 750 MG 24 hr tablet    Sig: Take 1 tablet (750 mg total) by mouth daily with breakfast.    Dispense:  90 tablet    Refill:  1    Order Specific Question:  Supervising Provider    Answer:  Chipper Herb [[id number]]     Patient to schedule  mammogram Labs pending Health maintenance reviewed Diet and exercise encouraged Continue all meds Follow up  In 3 months   [place], FNP

## 2014-10-17 NOTE — Patient Instructions (Signed)
Diabetes and Foot Care Diabetes may cause you to have problems because of poor blood supply (circulation) to your feet and legs. This may cause the skin on your feet to become thinner, break easier, and heal more slowly. Your skin may become dry, and the skin may peel and crack. You may also have nerve damage in your legs and feet causing decreased feeling in them. You may not notice minor injuries to your feet that could lead to infections or more serious problems. Taking care of your feet is one of the most important things you can do for yourself.  HOME CARE INSTRUCTIONS  Wear shoes at all times, even in the house. Do not go barefoot. Bare feet are easily injured.  Check your feet daily for blisters, cuts, and redness. If you cannot see the bottom of your feet, use a mirror or ask someone for help.  Wash your feet with warm water (do not use hot water) and mild soap. Then pat your feet and the areas between your toes until they are completely dry. Do not soak your feet as this can dry your skin.  Apply a moisturizing lotion or petroleum jelly (that does not contain alcohol and is unscented) to the skin on your feet and to dry, brittle toenails. Do not apply lotion between your toes.  Trim your toenails straight across. Do not dig under them or around the cuticle. File the edges of your nails with an emery board or nail file.  Do not cut corns or calluses or try to remove them with medicine.  Wear clean socks or stockings every day. Make sure they are not too tight. Do not wear knee-high stockings since they may decrease blood flow to your legs.  Wear shoes that fit properly and have enough cushioning. To break in new shoes, wear them for just a few hours a day. This prevents you from injuring your feet. Always look in your shoes before you put them on to be sure there are no objects inside.  Do not cross your legs. This may decrease the blood flow to your feet.  If you find a minor scrape,  cut, or break in the skin on your feet, keep it and the skin around it clean and dry. These areas may be cleansed with mild soap and water. Do not cleanse the area with peroxide, alcohol, or iodine.  When you remove an adhesive bandage, be sure not to damage the skin around it.  If you have a wound, look at it several times a day to make sure it is healing.  Do not use heating pads or hot water bottles. They may burn your skin. If you have lost feeling in your feet or legs, you may not know it is happening until it is too late.  Make sure your health care provider performs a complete foot exam at least annually or more often if you have foot problems. Report any cuts, sores, or bruises to your health care provider immediately. SEEK MEDICAL CARE IF:   You have an injury that is not healing.  You have cuts or breaks in the skin.  You have an ingrown nail.  You notice redness on your legs or feet.  You feel burning or tingling in your legs or feet.  You have pain or cramps in your legs and feet.  Your legs or feet are numb.  Your feet always feel cold. SEEK IMMEDIATE MEDICAL CARE IF:   There is increasing redness,   swelling, or pain in or around a wound.  There is a red line that goes up your leg.  Pus is coming from a wound.  You develop a fever or as directed by your health care provider.  You notice a bad smell coming from an ulcer or wound. Document Released: 11/12/2000 Document Revised: 07/18/2013 Document Reviewed: 04/24/2013 [employer] Patient Information 2015 [employer]. This information is not intended to replace advice given to you by your health care provider. Make sure you discuss any questions you have with your health care provider.  

## 2014-10-18 ENCOUNTER — Encounter: Payer: Self-pay | Admitting: Gynecology

## 2014-10-18 ENCOUNTER — Ambulatory Visit: Payer: Medicare Other | Attending: Gynecology | Admitting: Gynecology

## 2014-10-18 ENCOUNTER — Other Ambulatory Visit (HOSPITAL_COMMUNITY)
Admission: RE | Admit: 2014-10-18 | Discharge: 2014-10-18 | Disposition: A | Discharge status: Home / Self Care | Payer: Medicare Other | Source: Ambulatory Visit | Attending: Gynecology | Admitting: Gynecology

## 2014-10-18 ENCOUNTER — Telehealth: Payer: Self-pay | Admitting: *Deleted

## 2014-10-18 VITALS — BP 124/65 | HR 85 | Temp 97.8°F | Resp 16 | Ht 64.0 in | Wt 209.9 lb

## 2014-10-18 DIAGNOSIS — Z9079 Acquired absence of other genital organ(s): Secondary | ICD-10-CM | POA: Diagnosis not present

## 2014-10-18 DIAGNOSIS — I10 Essential (primary) hypertension: Secondary | ICD-10-CM | POA: Diagnosis not present

## 2014-10-18 DIAGNOSIS — Z79899 Other long term (current) drug therapy: Secondary | ICD-10-CM | POA: Diagnosis not present

## 2014-10-18 DIAGNOSIS — E119 Type 2 diabetes mellitus without complications: Secondary | ICD-10-CM | POA: Diagnosis not present

## 2014-10-18 DIAGNOSIS — C541 Malignant neoplasm of endometrium: Secondary | ICD-10-CM | POA: Diagnosis not present

## 2014-10-18 DIAGNOSIS — Z124 Encounter for screening for malignant neoplasm of cervix: Secondary | ICD-10-CM | POA: Diagnosis not present

## 2014-10-18 DIAGNOSIS — E785 Hyperlipidemia, unspecified: Secondary | ICD-10-CM | POA: Diagnosis not present

## 2014-10-18 DIAGNOSIS — E039 Hypothyroidism, unspecified: Secondary | ICD-10-CM | POA: Diagnosis not present

## 2014-10-18 DIAGNOSIS — Z90722 Acquired absence of ovaries, bilateral: Secondary | ICD-10-CM | POA: Insufficient documentation

## 2014-10-18 DIAGNOSIS — Z7982 Long term (current) use of aspirin: Secondary | ICD-10-CM | POA: Diagnosis not present

## 2014-10-18 DIAGNOSIS — Z90710 Acquired absence of both cervix and uterus: Secondary | ICD-10-CM | POA: Insufficient documentation

## 2014-10-18 DIAGNOSIS — Z8542 Personal history of malignant neoplasm of other parts of uterus: Secondary | ICD-10-CM

## 2014-10-18 LAB — CMP14+EGFR
ALK PHOS: 50 IU/L (ref 39–117)
ALT: 25 IU/L (ref 0–32)
AST: 25 IU/L (ref 0–40)
Albumin/Globulin Ratio: 1.7 (ref 1.1–2.5)
Albumin: 4.5 g/dL (ref 3.5–4.7)
BUN / CREAT RATIO: 20 (ref 11–26)
BUN: 20 mg/dL (ref 8–27)
CALCIUM: 9.7 mg/dL (ref 8.7–10.3)
CO2: 24 mmol/L (ref 18–29)
CREATININE: 1 mg/dL (ref 0.57–1.00)
Chloride: 97 mmol/L (ref 97–108)
GFR calc Af Amer: 61 mL/min/{1.73_m2} (ref >59)
GFR, EST NON AFRICAN AMERICAN: 53 mL/min/{1.73_m2} — AB (ref >59)
GLOBULIN, TOTAL: 2.6 g/dL (ref 1.5–4.5)
Glucose: 159 mg/dL — ABNORMAL HIGH (ref 65–99)
POTASSIUM: 4.3 mmol/L (ref 3.5–5.2)
SODIUM: 139 mmol/L (ref 134–144)
Total Bilirubin: 0.6 mg/dL (ref 0.0–1.2)
Total Protein: 7.1 g/dL (ref 6.0–8.5)

## 2014-10-18 LAB — NMR, LIPOPROFILE
Cholesterol: 242 mg/dL — ABNORMAL HIGH (ref 100–199)
HDL CHOLESTEROL BY NMR: 83 mg/dL (ref >39)
HDL PARTICLE NUMBER: 41.4 umol/L (ref >=30.5)
LDL Particle Number: 1531 nmol/L — ABNORMAL HIGH (ref <1000)
LDL Size: 21.2 nm (ref >20.5)
LDL-C: 141 mg/dL — ABNORMAL HIGH (ref 0–99)
LP-IR SCORE: 26 (ref <=45)
SMALL LDL PARTICLE NUMBER: 415 nmol/L (ref <=527)
Triglycerides by NMR: 90 mg/dL (ref 0–149)

## 2014-10-18 NOTE — Telephone Encounter (Signed)
-----   Message from [facility], [place] sent at 10/18/2014  3:43 PM EST ----- Hgba1c discussed at appointment Kidney and liver function stable ldl particle numbers are a little better - needs to be  On statin but refuses Strict low fat diet and exercise.

## 2014-10-18 NOTE — Addendum Note (Signed)
Addended by: Lucile Crater on: 10/18/2014 11:19 AM   Modules accepted: Orders

## 2014-10-18 NOTE — Patient Instructions (Signed)
Return to see Dr. Roselie Awkward in 6 months return to see me in one year. We will contact you if you're Pap smear report.

## 2014-10-18 NOTE — Progress Notes (Signed)
Consult Note: Gyn-Onc   Debra Campbell 80 y.o. female  Chief Complaint  Patient presents with  . Endometrial Cancer    Assessment :  Stage IB grade 1 endometrial adenocarcinoma (July 2014). Clinically free of disease  Plan: Pap smear is obtained. The patient return to see Dr. Roselie Awkward in 6 months return to see me in one year. Signs and symptoms recurrence were reviewed with the patient. She will have mammograms next week.   HPI: Patient was initially seen in consultation request of Dr. Roselie Awkward regarding management of a newly diagnosed endometrial carcinoma. The patient had some abnormal bleeding approximately 3 months prior to diagnosis.. She underwent a D&C on 04/27/2013 revealing a grade 1 endometrial carcinoma in a background of atypical complex hyperplasia.  Ultrasound exam shows a uterus measures 7.9 x 4.5 x 7.3 cm.  She has a past history of a laparotomy for an ectopic pregnancy. She's also had a total hip replacement.  Patient underwent a total abdominal hysterectomy bilateral salpingo-oophorectomy and pelvic lymphadenectomy on 06/26/2013. Final pathology showed a stage IB grade 1 endometrial carcinoma. No adjuvant therapy was recommended.  Review of Systems:10 point review of systems is negative except as noted in interval history.   Vitals: Blood pressure 124/65, pulse 85, temperature 97.8 F (36.6 C), temperature source Oral, resp. rate 16, height 5\' 4"  (1.626 m), weight 209 lb 14.4 oz (95.21 kg).  Physical Exam: General : The patient is a healthy woman in no acute distress.  HEENT: normocephalic, extraoccular movements normal; neck is supple without thyromegally  Lynphnodes: Supraclavicular and inguinal nodes not enlarged  Abdomen: Soft, non-tender, no ascites, no organomegally, no masses, small umbilical hernia. Midline incision is healing well Pelvic:  EGBUS: Normal female  Vagina: Normal, no lesions  Urethra and Bladder: Normal, non-tender  Cervix: Surgically absent   Uterus: Surgically absent Bi-manual examination: Non-tender; no adenxal masses or nodularity  Rectal: normal sphincter tone, no masses, no blood  Lower extremities: No edema or varicosities. Normal range of motion      Allergies  Allergen Reactions  . Ace Inhibitors Cough  . Iohexol      Desc: HIVES 40 YEARS AGO   . Statins     myalgia  . Iodine Rash  . Penicillins Rash  . Sulfa Antibiotics Rash    Past Medical History  Diagnosis Date  . Diabetes mellitus   . Thyroid disease   . Hypertension   . SVD (spontaneous vaginal delivery)     x 3  . Hypothyroidism   . Eczema   . Hyperlipidemia     no meds  . Arthritis     legs, lower back, hands  . History of kidney stones   . Headache(784.0)     hx of  . Cancer     endometrial    Past Surgical History  Procedure Laterality Date  . Lt knee arthroscopy    . Rt total hip replacement    . Ectopic pregnancy surgery    . Laparotomy      for ectopic pregnancy  . Tonsillectomy    . Wisdom tooth extraction    . Joint replacement    . Hysteroscopy w/d&c N/A 04/26/2013    Procedure: DILATATION AND CURETTAGE /HYSTEROSCOPY;  Surgeon: Woodroe Mode, MD;  Location: [facility];  Service: Gynecology;  Laterality: N/A;  . Laparotomy N/A 06/26/2013    Procedure: EXPLORATORY LAPAROTOMY;  Surgeon: Alvino Chapel, MD;  Location: [facility];  Service: Gynecology;  Laterality: N/A;  .  Abdominal hysterectomy Bilateral 06/26/2013    Procedure: TOTAL ABDOMINAL HYSTERECTOMY WITH BILATERAL SALPINGO OOPHERECTOMY WITH PELVIC LYMPHADNECTOMY;  Surgeon: Alvino Chapel, MD;  Location: [facility];  Service: Gynecology;  Laterality: Bilateral;  . Back surgery  06/26/2013    Current Outpatient Prescriptions  Medication Sig Dispense Refill  . acetaminophen (TYLENOL) 500 MG tablet Take 500-1,000 mg by mouth every 6 (six) hours as needed for pain.    [name] aspirin 81 MG tablet Take 81 mg by mouth every morning.     . diclofenac sodium (VOLTAREN) 1 %  GEL Apply 4 g topically 4 (four) times daily. 100 g 6  . gabapentin (NEURONTIN) 100 MG capsule TAKE 1 CAPSULE (100 MG TOTAL) BY MOUTH 2 (TWO) TIMES DAILY. 180 capsule 1  . hydrochlorothiazide (HYDRODIURIL) 25 MG tablet Take 1 tablet (25 mg total) by mouth every morning. 90 tablet 1  . levothyroxine (SYNTHROID) 175 MCG tablet Take 1 tablet (175 mcg total) by mouth daily before breakfast. 90 tablet 1  . losartan (COZAAR) 50 MG tablet TAKE 1 TABLET (50 MG TOTAL) BY MOUTH AT BEDTIME. 90 tablet 1  . metFORMIN (GLUCOPHAGE XR) 750 MG 24 hr tablet Take 1 tablet (750 mg total) by mouth daily with breakfast. 90 tablet 1  . Misc. Devices (SITZ BATH) MISC 1 each by Does not apply route as needed. 1 each 0  . Multiple Vitamin (MULTIVITAMIN WITH MINERALS) TABS Take 1 tablet by mouth daily.    [name] DELICA LANCETS 65K MISC TEST ONCE A DAY AND AS NEEDED 100 each 0  . PRESCRIPTION MEDICATION Apply 1 application topically 2 (two) times daily as needed (to itchy skin). Triamcinolone 0.1% / eucerine cream    . triamcinolone cream (KENALOG) 0.1 %     . hydrocortisone (ANUSOL-HC) 2.5 % rectal cream Place 1 application rectally 2 (two) times daily. 30 g 0  . hydrocortisone (ANUSOL-HC) 25 MG suppository Place 1 suppository (25 mg total) rectally 2 (two) times daily. 12 suppository 0  . traMADol (ULTRAM) 50 MG tablet Take 1 tablet (50 mg total) by mouth every 8 (eight) hours as needed. 30 tablet 1   No current facility-administered medications for this visit.    History   Social History  . Marital Status: Married    Spouse Name: N/A    Number of Children: N/A  . Years of Education: N/A   Occupational History  . Not on file.   Social History Main Topics  . Smoking status: Never Smoker   . Smokeless tobacco: Never Used  . Alcohol Use: No  . Drug Use: No  . Sexual Activity: No   Other Topics Concern  . Not on file   Social History Narrative    Family History  Problem Relation Age of Onset  .  Heart disease Father   . Thyroid disease Father   . Heart attack Father   . Heart disease Sister     open heart surgery  . Cancer Sister     breast  . Neuropathy Sister     secondary to cancer treatment  . Breast cancer Sister   . Suicidality Brother 48      CLARKE-PEARSON,Joslin Doell L, MD 10/18/2014, 10:35 AM

## 2014-10-21 NOTE — Telephone Encounter (Signed)
-----   Message from [facility], [place] sent at 10/18/2014  3:43 PM EST ----- Hgba1c discussed at appointment Kidney and liver function stable ldl particle numbers are a little better - needs to be  On statin but refuses Strict low fat diet and exercise.

## 2014-10-21 NOTE — Telephone Encounter (Signed)
Pt aware of results 

## 2014-10-22 ENCOUNTER — Telehealth: Payer: Self-pay | Admitting: Gynecologic Oncology

## 2014-10-22 LAB — CYTOLOGY - PAP

## 2014-10-22 NOTE — Telephone Encounter (Signed)
Pt notified about pap results: negative.  No questions or concerns voiced. 

## 2014-11-18 ENCOUNTER — Other Ambulatory Visit: Payer: Self-pay | Admitting: Family

## 2014-11-18 ENCOUNTER — Other Ambulatory Visit: Payer: Self-pay | Admitting: Nurse Practitioner

## 2014-12-02 DIAGNOSIS — M545 Low back pain: Secondary | ICD-10-CM | POA: Diagnosis not present

## 2014-12-02 DIAGNOSIS — M159 Polyosteoarthritis, unspecified: Secondary | ICD-10-CM | POA: Diagnosis not present

## 2014-12-02 DIAGNOSIS — M7531 Calcific tendinitis of right shoulder: Secondary | ICD-10-CM | POA: Diagnosis not present

## 2015-01-22 ENCOUNTER — Ambulatory Visit (INDEPENDENT_AMBULATORY_CARE_PROVIDER_SITE_OTHER): Payer: Medicare Other | Admitting: Nurse Practitioner

## 2015-01-22 ENCOUNTER — Encounter: Payer: Self-pay | Admitting: Nurse Practitioner

## 2015-01-22 VITALS — BP 139/77 | HR 85 | Temp 97.2°F | Ht 64.0 in | Wt 211.0 lb

## 2015-01-22 DIAGNOSIS — E785 Hyperlipidemia, unspecified: Secondary | ICD-10-CM | POA: Diagnosis not present

## 2015-01-22 DIAGNOSIS — L309 Dermatitis, unspecified: Secondary | ICD-10-CM | POA: Diagnosis not present

## 2015-01-22 DIAGNOSIS — I10 Essential (primary) hypertension: Secondary | ICD-10-CM | POA: Diagnosis not present

## 2015-01-22 DIAGNOSIS — E119 Type 2 diabetes mellitus without complications: Secondary | ICD-10-CM | POA: Diagnosis not present

## 2015-01-22 DIAGNOSIS — K649 Unspecified hemorrhoids: Secondary | ICD-10-CM

## 2015-01-22 DIAGNOSIS — E038 Other specified hypothyroidism: Secondary | ICD-10-CM

## 2015-01-22 DIAGNOSIS — E034 Atrophy of thyroid (acquired): Secondary | ICD-10-CM

## 2015-01-22 DIAGNOSIS — E1142 Type 2 diabetes mellitus with diabetic polyneuropathy: Secondary | ICD-10-CM

## 2015-01-22 LAB — POCT GLYCOSYLATED HEMOGLOBIN (HGB A1C)

## 2015-01-22 MED ORDER — TRIAMCINOLONE ACETONIDE 0.5 % EX OINT: 1.0000 "application " | TOPICAL_OINTMENT | Freq: Two times a day (BID) | CUTANEOUS | Status: DC

## 2015-01-22 MED ORDER — HYDROCORTISONE 2.5 % RE CREA: TOPICAL_CREAM | RECTAL | Status: DC

## 2015-01-22 MED ORDER — HYDROCHLOROTHIAZIDE 25 MG PO TABS: 25.0000 mg | ORAL_TABLET | Freq: Every morning | ORAL | Status: DC

## 2015-01-22 MED ORDER — LOSARTAN POTASSIUM 50 MG PO TABS: ORAL_TABLET | ORAL | Status: DC

## 2015-01-22 MED ORDER — METFORMIN HCL ER 750 MG PO TB24: 750.0000 mg | ORAL_TABLET | Freq: Every day | ORAL | Status: DC

## 2015-01-22 MED ORDER — LEVOTHYROXINE SODIUM 175 MCG PO TABS: 175.0000 ug | ORAL_TABLET | Freq: Every day | ORAL | Status: DC

## 2015-01-22 NOTE — Patient Instructions (Signed)

## 2015-01-22 NOTE — Progress Notes (Signed)
Subjective:    Patient ID: Debra Campbell, female    DOB: 10-08-1932, 81 y.o.   MRN: [id number]  Patient here today for follow up of chronic medical problems.  Diabetes She presents for her follow-up diabetic visit. She has type 2 diabetes mellitus. Pertinent negatives for diabetes include no chest pain and no visual change. Symptoms are stable. Risk factors for coronary artery disease include dyslipidemia, hypertension, obesity and post-menopausal. Current diabetic treatment includes oral agent (monotherapy). She is compliant with treatment all of the time. Her breakfast blood glucose range is generally 130-140 mg/dl. She does not see a podiatrist.Eye exam is current.  Hypertension This is a chronic problem. The current episode started more than 1 year ago. The problem is unchanged. The problem is controlled. Pertinent negatives include no chest pain. Risk factors for coronary artery disease include obesity, post-menopausal state, dyslipidemia and diabetes mellitus. Past treatments include diuretics. The current treatment provides significant improvement. There are no compliance problems.   Hyperlipidemia This is a chronic problem. The current episode started more than 1 year ago. The problem is controlled. Exacerbating diseases include diabetes and obesity. There are no known factors aggravating her hyperlipidemia. Pertinent negatives include no chest pain. There are no compliance problems.  Risk factors for coronary artery disease include diabetes mellitus, family history, dyslipidemia, hypertension, post-menopausal and obesity.  Hypothyroidism Chronic problem. Patient taking Synthroid without complications. No c/o fatigue.  Diabetic neuropathy Gabapentin working but would like to increase dose. Decreased dose back to 157m BID- said that 3028mdose made her dizzy osteopenia  Last bone density test was 02/06/14- stable.   Review of Systems  Constitutional: Negative.   HENT: Negative.    Respiratory: Negative.   Cardiovascular: Negative.  Negative for chest pain.  Gastrointestinal: Negative.   Endocrine: Negative.   Genitourinary: Negative.   Musculoskeletal: Negative.   Skin: Negative.   Allergic/Immunologic: Negative.   Neurological: Negative.   Hematological: Negative.   Psychiatric/Behavioral: Negative.   All other systems reviewed and are negative.      Objective:   Physical Exam  Constitutional: She is oriented to person, place, and time. She appears well-developed and well-nourished.  HENT:  Nose: Nose normal.  Mouth/Throat: Oropharynx is clear and moist.  Eyes: Conjunctivae and EOM are normal. Pupils are equal, round, and reactive to light.  Neck: Trachea normal and full passive range of motion without pain. Carotid bruit is not present.  Cardiovascular: Normal rate, regular rhythm, normal heart sounds and intact distal pulses.   Pulmonary/Chest: Effort normal and breath sounds normal.  Abdominal: Soft. Bowel sounds are normal.  Musculoskeletal: Normal range of motion. She exhibits edema (traces bil lower extremities. ).  Neurological: She is alert and oriented to person, place, and time. She has normal reflexes.  (+) 4/4 monofilament bil feet   Skin: Skin is warm and dry.  Mild callus formation bil heels  Psychiatric: She has a normal mood and affect. Her behavior is normal. Judgment and thought content normal.   BP 139/77 mmHg  Pulse 85  Temp(Src) 97.2 F (36.2 C) (Oral)  Ht 5' 4" (1.626 m)  Wt 211 lb (95.709 kg)  BMI 36.20 kg/m2  Results for orders placed or performed in visit on 01/22/15  POCT glycosylated hemoglobin (Hb A1C)  Result Value Ref Range   Hemoglobin A1C 7.3%         Assessment & Plan:   1. Essential hypertension Do not add salt to diet - CMP14+EGFR - hydrochlorothiazide (HYDRODIURIL) 25 MG tablet;  Take 1 tablet (25 mg total) by mouth every morning.  Dispense: 90 tablet; Refill: 1 - losartan (COZAAR) 50 MG tablet; TAKE  1 TABLET (50 MG TOTAL) BY MOUTH AT BEDTIME.  Dispense: 90 tablet; Refill: 1  2. Hyperlipidemia Low fat diet - NMR, lipoprofile  3. Type 2 diabetes mellitus without complication Carb counting - POCT glycosylated hemoglobin (Hb A1C)  4. Hypothyroidism due to acquired atrophy of thyroid - levothyroxine (SYNTHROID) 175 MCG tablet; Take 1 tablet (175 mcg total) by mouth daily before breakfast.  Dispense: 90 tablet; Refill: 1  5. Type 2 diabetes mellitus with diabetic polyneuropathy Carb counting - metFORMIN (GLUCOPHAGE XR) 750 MG 24 hr tablet; Take 1 tablet (750 mg total) by mouth daily with breakfast.  Dispense: 90 tablet; Refill: 1  6. Hemorrhoids, unspecified hemorrhoid type Stool softners - hydrocortisone (PROCTOZONE-HC) 2.5 % rectal cream; APPLY RECTALLY TWICE DAILY  Dispense: 30 g; Refill: 1  7. Dermatitis Keep area clean and dry - triamcinolone ointment (KENALOG) 0.5 %; Apply 1 application topically 2 (two) times daily.  Dispense: 45 g; Refill: 1  patient to schedule mammogram hemoccult cards given to patient with directions Labs pending Health maintenance reviewed Diet and exercise encouraged Continue all meds Follow up  In 3 month   [place], FNP

## 2015-01-23 LAB — NMR, LIPOPROFILE
Cholesterol: 231 mg/dL — ABNORMAL HIGH (ref 100–199)
HDL Cholesterol by NMR: 62 mg/dL (ref >39)
HDL Particle Number: 37.9 umol/L (ref >=30.5)
LDL Particle Number: 1596 nmol/L — ABNORMAL HIGH (ref <1000)
LDL SIZE: 21.1 nm (ref >20.5)
LDL-C: 135 mg/dL — AB (ref 0–99)
LP-IR Score: 49 — ABNORMAL HIGH (ref <=45)
Small LDL Particle Number: 152 nmol/L (ref <=527)
TRIGLYCERIDES BY NMR: 172 mg/dL — AB (ref 0–149)

## 2015-01-23 LAB — CMP14+EGFR
A/G RATIO: 1.7 (ref 1.1–2.5)
ALT: 22 IU/L (ref 0–32)
AST: 19 IU/L (ref 0–40)
Albumin: 4.4 g/dL (ref 3.5–4.7)
Alkaline Phosphatase: 52 IU/L (ref 39–117)
BUN/Creatinine Ratio: 17 (ref 11–26)
BUN: 22 mg/dL (ref 8–27)
Bilirubin Total: 0.7 mg/dL (ref 0.0–1.2)
CALCIUM: 10.1 mg/dL (ref 8.7–10.3)
CO2: 25 mmol/L (ref 18–29)
Chloride: 97 mmol/L (ref 97–108)
Creatinine, Ser: 1.31 mg/dL — ABNORMAL HIGH (ref 0.57–1.00)
GFR calc Af Amer: 44 mL/min/{1.73_m2} — ABNORMAL LOW (ref >59)
GFR, EST NON AFRICAN AMERICAN: 38 mL/min/{1.73_m2} — AB (ref >59)
GLOBULIN, TOTAL: 2.6 g/dL (ref 1.5–4.5)
Glucose: 229 mg/dL — ABNORMAL HIGH (ref 65–99)
POTASSIUM: 4.7 mmol/L (ref 3.5–5.2)
SODIUM: 140 mmol/L (ref 134–144)
TOTAL PROTEIN: 7 g/dL (ref 6.0–8.5)

## 2015-02-12 ENCOUNTER — Telehealth: Payer: Self-pay | Admitting: Nurse Practitioner

## 2015-02-12 NOTE — Telephone Encounter (Signed)
FYI

## 2015-02-13 MED ORDER — PROCTOSOL HC 2.5 % RE CREA: 1.0000 "application " | TOPICAL_CREAM | Freq: Two times a day (BID) | RECTAL | Status: DC

## 2015-02-13 NOTE — Telephone Encounter (Signed)
rx sent to pharmacy

## 2015-02-13 NOTE — Telephone Encounter (Signed)
Pt aware rx sent to the pharmacy. 

## 2015-02-28 ENCOUNTER — Other Ambulatory Visit: Payer: Self-pay | Admitting: *Deleted

## 2015-02-28 DIAGNOSIS — E1142 Type 2 diabetes mellitus with diabetic polyneuropathy: Secondary | ICD-10-CM

## 2015-02-28 MED ORDER — GABAPENTIN 100 MG PO CAPS: ORAL_CAPSULE | ORAL | Status: DC

## 2015-02-28 MED ORDER — PROCTOSOL HC 2.5 % RE CREA: 1.0000 "application " | TOPICAL_CREAM | Freq: Two times a day (BID) | RECTAL | Status: DC

## 2015-03-24 DIAGNOSIS — Z1231 Encounter for screening mammogram for malignant neoplasm of breast: Secondary | ICD-10-CM | POA: Diagnosis not present

## 2015-03-31 ENCOUNTER — Other Ambulatory Visit (HOSPITAL_COMMUNITY)
Admission: RE | Admit: 2015-03-31 | Discharge: 2015-03-31 | Disposition: A | Discharge status: Home / Self Care | Payer: Medicare Other | Source: Ambulatory Visit | Attending: Obstetrics & Gynecology | Admitting: Obstetrics & Gynecology

## 2015-03-31 ENCOUNTER — Ambulatory Visit (INDEPENDENT_AMBULATORY_CARE_PROVIDER_SITE_OTHER): Payer: Medicare Other | Admitting: Obstetrics & Gynecology

## 2015-03-31 ENCOUNTER — Encounter: Payer: Self-pay | Admitting: Obstetrics & Gynecology

## 2015-03-31 VITALS — BP 108/67 | HR 100 | Resp 16 | Ht 64.0 in | Wt 218.0 lb

## 2015-03-31 DIAGNOSIS — Z90711 Acquired absence of uterus with remaining cervical stump: Secondary | ICD-10-CM

## 2015-03-31 DIAGNOSIS — Z1151 Encounter for screening for human papillomavirus (HPV): Secondary | ICD-10-CM | POA: Diagnosis not present

## 2015-03-31 DIAGNOSIS — Z124 Encounter for screening for malignant neoplasm of cervix: Secondary | ICD-10-CM | POA: Insufficient documentation

## 2015-03-31 DIAGNOSIS — Z1272 Encounter for screening for malignant neoplasm of vagina: Secondary | ICD-10-CM

## 2015-03-31 DIAGNOSIS — Z8542 Personal history of malignant neoplasm of other parts of uterus: Secondary | ICD-10-CM

## 2015-03-31 DIAGNOSIS — C541 Malignant neoplasm of endometrium: Secondary | ICD-10-CM

## 2015-03-31 NOTE — Progress Notes (Signed)
Subjective:     Patient ID: Debra Campbell, female   DOB: 17-Sep-1932, 81 y.o.   MRN: [id number]  HPI [id number] S/p endometrial ca 2014, for repeat pap smear per Dr Aldean Ast, 09/2014 result was benign.  Past Medical History  Diagnosis Date  . Diabetes mellitus   . Thyroid disease   . Hypertension   . SVD (spontaneous vaginal delivery)     x 3  . Hypothyroidism   . Eczema   . Hyperlipidemia     no meds  . Arthritis     legs, lower back, hands  . History of kidney stones   . Headache(784.0)     hx of  . Cancer     endometrial   Past Surgical History  Procedure Laterality Date  . Lt knee arthroscopy    . Rt total hip replacement    . Ectopic pregnancy surgery    . Laparotomy      for ectopic pregnancy  . Tonsillectomy    . Wisdom tooth extraction    . Joint replacement    . Hysteroscopy w/d&c N/A 04/26/2013    Procedure: DILATATION AND CURETTAGE /HYSTEROSCOPY;  Surgeon: Woodroe Mode, MD;  Location: [facility];  Service: Gynecology;  Laterality: N/A;  . Laparotomy N/A 06/26/2013    Procedure: EXPLORATORY LAPAROTOMY;  Surgeon: Alvino Chapel, MD;  Location: [facility];  Service: Gynecology;  Laterality: N/A;  . Abdominal hysterectomy Bilateral 06/26/2013    Procedure: TOTAL ABDOMINAL HYSTERECTOMY WITH BILATERAL SALPINGO OOPHERECTOMY WITH PELVIC LYMPHADNECTOMY;  Surgeon: Alvino Chapel, MD;  Location: [facility];  Service: Gynecology;  Laterality: Bilateral;  . Back surgery  06/26/2013   Allergies  Allergen Reactions  . Ace Inhibitors Cough  . Iohexol      Desc: HIVES 40 YEARS AGO   . Statins     myalgia  . Iodine Rash  . Penicillins Rash  . Sulfa Antibiotics Rash      Review of Systems  Constitutional: Negative.   Genitourinary: Negative for frequency, vaginal bleeding, vaginal discharge and pelvic pain.  Musculoskeletal: Positive for arthralgias.       Objective:   Physical Exam  Constitutional: She is oriented to person, place, and time. She  appears well-developed. No distress.  Pulmonary/Chest: Effort normal.  Genitourinary: Vagina normal. No vaginal discharge found.  Cuff intact no lesions pap obtained  Neurological: She is alert and oriented to person, place, and time.  Skin: Skin is warm and dry.  Psychiatric: She has a normal mood and affect. Her behavior is normal.  Vitals reviewed.      Assessment:     Doing well post TAH/BSO for uterine cancer, NED     Plan:     F/u Dr Aldean Ast 6 mo  Woodroe Mode, MD 03/31/2015

## 2015-03-31 NOTE — Patient Instructions (Signed)
Uterine Cancer Uterine cancer is an abnormal growth of tissue (tumor) in the uterus that is cancerous (malignant). Unlike noncancerous (benign) tumors, malignant tumors can spread to other parts of your body. The wall of the uterus has two layers of tissue. The inner layer is the endometrium. The outer layer of muscle tissue is the myometrium. The most common type of uterine cancer begins in the endometrium. This is called endometrial cancer. Cancer that begins in the myometrium is called uterine sarcoma, which is very rare.  RISK FACTORS  Although the exact cause of uterine cancer is unknown, there are a number of risk factors that can increase your chances of getting uterine cancer. They include:  Your age. Uterine cancer occurs mostly in women older than 50 years.   Having an enlarged endometrium (endometrial hyperplasia).   Using hormone therapy.   Obesity.   Taking the drug tamoxifen.   White race.   Infertility.   Never being pregnant.   Beginning menstrual periods at an age younger than 12 years.   Having menstrual periods at an age older than 10 years.   Personal history of ovarian, intestinal, or colorectal cancer.   Having a family history of uterine cancer.   Having a family history of hereditary nonpolyposis colon cancer (HNPCC).   Having diabetes, high blood pressure, thyroid disease, or gallbladder disease.   Long-term use of high-dose birth control pills.   Exposure to radiation.   Smoking.  SIGNS AND SYMPTOMS   Abnormal vaginal bleeding or discharge. Bleeding may start as a watery, blood-streaked flow that gradually contains more blood.   Any vaginal bleeding after menopause.   Difficult or painful urination.   Pain during intercourse.   Pain in the pelvic area.  Mass in the vagina.  Pain or fullness in the abdomen.  Frequent urination.  Bleeding between periods.  Growth of the stomach.   Unexplained weight loss.   Uterine cancer usually occurs after menopause. However, it may also occur around the time that menopause begins. Abnormal vaginal bleeding is the most common symptom of uterine cancer. Women should not assume that abnormal vaginal bleeding is part of menopause. DIAGNOSIS  Your health care provider will ask about your medical history. He or she may also perform a number of procedures, such as:  A physical and pelvic exam. Your health care provider will feel your pelvis for any lumps.   Blood and urine tests.   X-rays.   Imaging tests, such as CT scans, ultrasonography, or MRIs.   A hysteroscopy to view the inside of your uterus.   A Pap test to sample cells from the cervix and upper vagina to check for abnormal cells.   Taking a tissue sample (biopsy) from the uterine lining to look for cancer cells.   A dilation and curettage (D&C). This involves stretching (dilation) the cervix and scraping (curettage) the inside lining of the uterus to get a tissue sample. The sample is examined under a microscope to look for cancer cells.  Your cancer will be staged to determine its severity and extent. Staging is a careful attempt to find out the size of the tumor, whether the cancer has spread, and if so, to what parts of the body. You may need to have more tests to determine the stage of your cancer. The test results will help determine what treatment plan is best for you. Cancer stages include:   Stage I. The cancer is only found in the uterus.  Stage II. The  cancer has spread to the cervix.  Stage III. The cancer has spread outside the uterus, but not outside the pelvis. The cancer may have spread to the lymph nodes in the pelvis.  Stage IV. The cancer has spread to other parts of the body, such as the bladder or rectum. TREATMENT  Most women with uterine cancer are treated with surgery. This includes removing the uterus, cervix, fallopian tubes, and ovaries (total hysterectomy). Your  lymph nodes near the tumor may also be removed. Some women have radiation, chemotherapy, or hormonal therapy. Other women have a combination of these therapies. HOME CARE INSTRUCTIONS   Take medicines only as directed by your health care provider.   Maintain a healthy diet.  Exercise regularly.   If you have diabetes, high blood pressure, thyroid disease, or gallbladder disease, follow your health care provider's instructions to keep it under control.   Do not smoke.   Consider joining a support group. This may help you learn to cope with the stress of having uterine cancer.   Seek advice to help you manage treatment side effects.   Keep all follow-up visits as directed by your health care provider.  SEEK MEDICAL CARE IF:  You have increased stomach or pelvic pain.  You cannot urinate.  You have abnormal bleeding. Document Released: 11/15/2005 Document Revised: 04/01/2014 Document Reviewed: 05/04/2013 [facility] Patient Information 2015 [place], [place]. This information is not intended to replace advice given to you by your health care provider. Make sure you discuss any questions you have with your health care provider.

## 2015-04-03 LAB — CYTOLOGY - PAP

## 2015-04-07 ENCOUNTER — Encounter: Payer: Self-pay | Admitting: *Deleted

## 2015-04-14 ENCOUNTER — Other Ambulatory Visit: Payer: Self-pay | Admitting: Nurse Practitioner

## 2015-04-23 ENCOUNTER — Encounter: Payer: Self-pay | Admitting: Family Medicine

## 2015-05-01 ENCOUNTER — Ambulatory Visit (INDEPENDENT_AMBULATORY_CARE_PROVIDER_SITE_OTHER): Payer: Medicare Other | Admitting: Nurse Practitioner

## 2015-05-01 ENCOUNTER — Ambulatory Visit (INDEPENDENT_AMBULATORY_CARE_PROVIDER_SITE_OTHER): Payer: Medicare Other

## 2015-05-01 ENCOUNTER — Encounter: Payer: Self-pay | Admitting: Nurse Practitioner

## 2015-05-01 VITALS — BP 137/72 | HR 80 | Temp 97.5°F | Ht 64.0 in | Wt 214.0 lb

## 2015-05-01 DIAGNOSIS — E034 Atrophy of thyroid (acquired): Secondary | ICD-10-CM | POA: Diagnosis not present

## 2015-05-01 DIAGNOSIS — E785 Hyperlipidemia, unspecified: Secondary | ICD-10-CM | POA: Diagnosis not present

## 2015-05-01 DIAGNOSIS — M150 Primary generalized (osteo)arthritis: Secondary | ICD-10-CM

## 2015-05-01 DIAGNOSIS — E1142 Type 2 diabetes mellitus with diabetic polyneuropathy: Secondary | ICD-10-CM | POA: Diagnosis not present

## 2015-05-01 DIAGNOSIS — E038 Other specified hypothyroidism: Secondary | ICD-10-CM

## 2015-05-01 DIAGNOSIS — I10 Essential (primary) hypertension: Secondary | ICD-10-CM

## 2015-05-01 DIAGNOSIS — M159 Polyosteoarthritis, unspecified: Secondary | ICD-10-CM

## 2015-05-01 DIAGNOSIS — E119 Type 2 diabetes mellitus without complications: Secondary | ICD-10-CM | POA: Insufficient documentation

## 2015-05-01 DIAGNOSIS — M8580 Other specified disorders of bone density and structure, unspecified site: Secondary | ICD-10-CM

## 2015-05-01 DIAGNOSIS — Z23 Encounter for immunization: Secondary | ICD-10-CM

## 2015-05-01 LAB — POCT UA - MICROALBUMIN: Microalbumin Ur, POC: 20 mg/L

## 2015-05-01 LAB — POCT GLYCOSYLATED HEMOGLOBIN (HGB A1C): Hemoglobin A1C: 8.1

## 2015-05-01 MED ORDER — GLUCOSE BLOOD VI STRP: ORAL_STRIP | Status: DC

## 2015-05-01 MED ORDER — METFORMIN HCL 1000 MG PO TABS: 1000.0000 mg | ORAL_TABLET | Freq: Two times a day (BID) | ORAL | Status: DC

## 2015-05-01 NOTE — Patient Instructions (Signed)

## 2015-05-01 NOTE — Progress Notes (Signed)
Subjective:    Patient ID: Debra Campbell, female    DOB: 03-28-1932, 81 y.o.   MRN: [id number]  Patient here today for follow up of chronic medical problems.  Diabetes She presents for her follow-up diabetic visit. She has type 2 diabetes mellitus. Pertinent negatives for diabetes include no chest pain and no visual change. Symptoms are stable. Pertinent negatives for diabetic complications include no CVA. Risk factors for coronary artery disease include dyslipidemia, hypertension, obesity and post-menopausal. Current diabetic treatment includes oral agent (monotherapy). She is compliant with treatment all of the time. She is following a diabetic diet. When asked about meal planning, she reported none. She has not had a previous visit with a dietitian. She rarely participates in exercise. Her breakfast blood glucose is taken between 9-10 am. Her breakfast blood glucose range is generally 140-180 mg/dl. Her highest blood glucose is >200 mg/dl. Her overall blood glucose range is 140-180 mg/dl. An ACE inhibitor/angiotensin II receptor blocker is being taken. She does not see a podiatrist.Eye exam is current.  Hypertension This is a chronic problem. The current episode started more than 1 year ago. The problem is unchanged. The problem is controlled. Pertinent negatives include no chest pain. Risk factors for coronary artery disease include obesity, post-menopausal state, dyslipidemia and diabetes mellitus. Past treatments include diuretics. The current treatment provides significant improvement. There are no compliance problems.  There is no history of CAD/MI or CVA.  Hyperlipidemia This is a chronic problem. The current episode started more than 1 year ago. The problem is controlled. Exacerbating diseases include diabetes and obesity. There are no known factors aggravating her hyperlipidemia. Pertinent negatives include no chest pain. There are no compliance problems.  Risk factors for coronary artery  disease include diabetes mellitus, family history, dyslipidemia, hypertension, post-menopausal and obesity.  Hypothyroidism Chronic problem. Patient taking Synthroid without complications. No c/o fatigue.  Diabetic neuropathy Gabapentin working but would like to increase dose. Decreased dose back to 15m BID- said that 3016mdose made her dizzy osteopenia  Last bone density test was 02/06/14- stable. osteoarthritis Has in multiple joints. Has trouble rising from sitting position. Needs lift chair    Review of Systems  Constitutional: Negative.   HENT: Negative.   Respiratory: Negative.   Cardiovascular: Negative.  Negative for chest pain.  Gastrointestinal: Negative.   Endocrine: Negative.   Genitourinary: Negative.   Musculoskeletal: Negative.   Skin: Negative.   Allergic/Immunologic: Negative.   Neurological: Negative.   Hematological: Negative.   Psychiatric/Behavioral: Negative.   All other systems reviewed and are negative.      Objective:   Physical Exam  Constitutional: She is oriented to person, place, and time. She appears well-developed and well-nourished.  HENT:  Nose: Nose normal.  Mouth/Throat: Oropharynx is clear and moist.  Eyes: Conjunctivae and EOM are normal. Pupils are equal, round, and reactive to light.  Neck: Trachea normal and full passive range of motion without pain. Carotid bruit is not present.  Cardiovascular: Normal rate, regular rhythm, normal heart sounds and intact distal pulses.   Pulmonary/Chest: Effort normal and breath sounds normal.  Abdominal: Soft. Bowel sounds are normal.  Musculoskeletal: Normal range of motion. She exhibits edema (traces bil lower extremities. ).  Neurological: She is alert and oriented to person, place, and time. She has normal reflexes.  (+) 4/4 monofilament bil feet   Skin: Skin is warm and dry.  Mild callus formation bil heels  Psychiatric: She has a normal mood and affect. Her behavior is normal.  Judgment  and thought content normal.    BP 137/72 mmHg  Pulse 80  Temp(Src) 97.5 F (36.4 C) (Oral)  Ht 5' 4"  (1.626 m)  Wt 214 lb (97.07 kg)  BMI 36.72 kg/m2  Results for orders placed or performed in visit on 05/01/15  POCT glycosylated hemoglobin (Hb A1C)  Result Value Ref Range   Hemoglobin A1C 8.1   POCT UA - Microalbumin  Result Value Ref Range   Microalbumin Ur, POC 20 mg/L      Chest xray- normal-Preliminary reading by Ronnald Collum, FNP  [facility]   EKG- sinus Mallory Shirk, FNP       Assessment & Plan:   1. Essential hypertension Do not add slat to diet - CMP14+EGFR - EKG 12-Lead - DG Chest 2 View; Future  2. Hyperlipidemia Low fat diet - NMR, lipoprofile  3. Type 2 diabetes mellitus with diabetic polyneuropathy Stricter at watching carbs in diet Change metformin XR to metformin 1043m BID If gets diarrhea  Let me know - POCT glycosylated hemoglobin (Hb A1C) - POCT UA - Microalbumin  4. Hypothyroidism due to acquired atrophy of thyroid  5. Diabetic polyneuropathy associated with type 2 diabetes mellitus  6. Osteopenia Weight bearing exercises  7. Osteoarthritis - order for lift chair  Labs pending Health maintenance reviewed Diet and exercise encouraged Continue all meds Follow up  In 3 month   [place] FNP

## 2015-05-01 NOTE — Addendum Note (Signed)
Addended by: Rolena Infante on: 05/01/2015 10:08 AM   Modules accepted: Orders

## 2015-05-01 NOTE — Addendum Note (Signed)
Addended by: Earlene Plater on: 05/01/2015 10:22 AM   Modules accepted: Orders

## 2015-05-02 ENCOUNTER — Other Ambulatory Visit: Payer: Self-pay | Admitting: *Deleted

## 2015-05-02 DIAGNOSIS — E118 Type 2 diabetes mellitus with unspecified complications: Secondary | ICD-10-CM

## 2015-05-02 LAB — CMP14+EGFR
A/G RATIO: 1.5 (ref 1.1–2.5)
ALBUMIN: 4.1 g/dL (ref 3.5–4.7)
ALT: 27 IU/L (ref 0–32)
AST: 23 IU/L (ref 0–40)
Alkaline Phosphatase: 52 IU/L (ref 39–117)
BILIRUBIN TOTAL: 0.4 mg/dL (ref 0.0–1.2)
BUN/Creatinine Ratio: 13 (ref 11–26)
BUN: 13 mg/dL (ref 8–27)
CALCIUM: 9.7 mg/dL (ref 8.7–10.3)
CO2: 27 mmol/L (ref 18–29)
Chloride: 101 mmol/L (ref 97–108)
Creatinine, Ser: 1.03 mg/dL — ABNORMAL HIGH (ref 0.57–1.00)
GFR calc Af Amer: 59 mL/min/{1.73_m2} — ABNORMAL LOW (ref >59)
GFR, EST NON AFRICAN AMERICAN: 51 mL/min/{1.73_m2} — AB (ref >59)
Globulin, Total: 2.7 g/dL (ref 1.5–4.5)
Glucose: 241 mg/dL — ABNORMAL HIGH (ref 65–99)
Potassium: 5.2 mmol/L (ref 3.5–5.2)
SODIUM: 143 mmol/L (ref 134–144)
Total Protein: 6.8 g/dL (ref 6.0–8.5)

## 2015-05-02 LAB — NMR, LIPOPROFILE
CHOLESTEROL: 208 mg/dL — AB (ref 100–199)
HDL Cholesterol by NMR: 42 mg/dL (ref >39)
HDL PARTICLE NUMBER: 29.9 umol/L — AB (ref >=30.5)
LDL Particle Number: 1611 nmol/L — ABNORMAL HIGH (ref <1000)
LDL Size: 20.3 nm (ref >20.5)
LDL-C: 115 mg/dL — ABNORMAL HIGH (ref 0–99)
LP-IR SCORE: 60 — AB (ref <=45)
Small LDL Particle Number: 897 nmol/L — ABNORMAL HIGH (ref <=527)
Triglycerides by NMR: 253 mg/dL — ABNORMAL HIGH (ref 0–149)

## 2015-05-02 LAB — MICROALBUMIN, URINE: Microalbumin, Urine: 9.6 ug/mL

## 2015-05-02 MED ORDER — ACCU-CHEK SOFT TOUCH LANCETS MISC: Status: DC

## 2015-05-02 MED ORDER — GLUCOSE BLOOD VI STRP: ORAL_STRIP | Status: DC

## 2015-05-02 NOTE — Telephone Encounter (Signed)
rx for strips and lancetes

## 2015-06-06 ENCOUNTER — Encounter (INDEPENDENT_AMBULATORY_CARE_PROVIDER_SITE_OTHER): Payer: Self-pay

## 2015-06-06 ENCOUNTER — Ambulatory Visit (INDEPENDENT_AMBULATORY_CARE_PROVIDER_SITE_OTHER): Payer: Medicare Other | Admitting: Pharmacist

## 2015-06-06 ENCOUNTER — Encounter: Payer: Self-pay | Admitting: Pharmacist

## 2015-06-06 VITALS — BP 118/64 | HR 88 | Ht 64.0 in | Wt 212.0 lb

## 2015-06-06 DIAGNOSIS — Z0000 Encounter for general adult medical examination without abnormal findings: Secondary | ICD-10-CM

## 2015-06-06 NOTE — Patient Instructions (Addendum)
  Debra Campbell , Thank you for taking time to come for your Medicare Wellness Visit. I appreciate your ongoing commitment to your health goals. Please review the following plan we discussed and let me know if I can assist you in the future.   These are the goals we discussed:  Remember to check on your [place] and Living Will - get copy to your designated power of Attorney and our office (if you would like [name] to have a copy) Remember to bring in stool test that you have at home.  Goals    . HEMOGLOBIN A1C < 7.0      Increase non-starchy vegetables - carrots, green bean, squash, zucchini, tomatoes, onions, peppers, spinach and other green leafy vegetables, cabbage, lettuce, cucumbers, asparagus, okra (not fried), eggplant limit sugar and processed foods (cakes, cookies, ice cream, crackers and chips) Increase fresh fruit but limit serving sizes 1/2 cup or about the size of tennis or baseball limit red meat to no more than 1-2 times per week (serving size about the size of your palm) Choose whole grains / lean proteins - whole wheat bread, quinoa, whole grain rice (1/2 cup), fish, chicken, [place]     . Increase physical activity     Try to do chair exercises daily - refer to handout given today       This is a list of the screening recommended for you and due dates:  Health Maintenance  Topic Date Due  . Shingles Vaccine  07/23/2015*  . Flu Shot  06/30/2015  . Eye exam for diabetics  07/18/2015  . Hemoglobin A1C  10/31/2015  . DEXA scan (bone density measurement)  02/07/2016  . Mammogram  03/23/2016  . Complete foot exam   04/30/2016  . Urine Protein Check  04/30/2016  . Tetanus Vaccine  10/29/2018  . Colon Cancer Screening  02/28/2019  . Pneumonia vaccines  Completed  *Topic was postponed. The date shown is not the original due date.

## 2015-06-06 NOTE — Progress Notes (Signed)
Patient ID: Debra Campbell, female   DOB: 1932/03/16, 81 y.o.   MRN: [id number]    Subjective:   Debra Campbell is a 81 y.o. female who presents for a Subsequent Medicare Annual Wellness Visit.  Debra Campbell is a WF, married.  She is retired from [employer] and also farmed prior to working in [employer]   Patient c/o back and shoulder pain which she states improves when she is sitting or lying down.  She takes APAP 500mg  about 4 to 6 capsules daily with good relief most days.  She previously took diclofenac for this but was discontinued due to concerns about its affect on kidney function.  She also has tried diclofenac creams which she said did not help any more than aspercream.   Current Medications (verified) Outpatient Encounter Prescriptions as of 06/06/2015  Medication Sig  . acetaminophen (TYLENOL) 500 MG tablet Take 500-1,000 mg by mouth every 6 (six) hours as needed for pain.  [name] aspirin 81 MG tablet Take 81 mg by mouth every morning.   . clindamycin (CLEOCIN) 150 MG capsule Take 150 mg by mouth 3 (three) times daily before meals.  . gabapentin (NEURONTIN) 100 MG capsule Take 2 capsules (200 mg total) by mouth at bedtime.  [name] glucose blood (ACCU-CHEK AVIVA PLUS) test strip Test 1x per day and prn. DX.E11.9  . hydrochlorothiazide (HYDRODIURIL) 25 MG tablet Take 1 tablet (25 mg total) by mouth every morning.  . Lancets (ACCU-CHEK SOFT TOUCH) lancets Test BS 1 x daily and PRN DX. E11.9  . levothyroxine (SYNTHROID) 175 MCG tablet Take 1 tablet (175 mcg total) by mouth daily before breakfast.  . losartan (COZAAR) 50 MG tablet TAKE 1 TABLET (50 MG TOTAL) BY MOUTH AT BEDTIME.  . metFORMIN (GLUCOPHAGE) 1000 MG tablet Take 1 tablet (1,000 mg total) by mouth 2 (two) times daily with a meal.  . Multiple Vitamin (MULTIVITAMIN WITH MINERALS) TABS Take 1 tablet by mouth daily.  [name] PROCTOSOL HC 2.5 % rectal cream Place 1 application rectally 2 (two) times daily. (Patient taking differently: Place 1 application  rectally 2 (two) times daily as needed. )  . triamcinolone ointment (KENALOG) 0.5 % Apply 1 application topically 2 (two) times daily.  . [DISCONTINUED] gabapentin (NEURONTIN) 100 MG capsule TAKE ONE CAPSULE BY MOUTH TWICE DAILY   No facility-administered encounter medications on file as of 06/06/2015.    Allergies (verified) Ace inhibitors; Iohexol; Statins; Iodine; Penicillins; and Sulfa antibiotics   History: Past Medical History  Diagnosis Date  . Diabetes mellitus   . Thyroid disease   . Hypertension   . SVD (spontaneous vaginal delivery)     x 3  . Hypothyroidism   . Eczema   . Hyperlipidemia     no meds  . Arthritis     legs, lower back, hands  . History of kidney stones   . Headache(784.0)     hx of  . Cancer     endometrial  . Ectopic pregnancy    Past Surgical History  Procedure Laterality Date  . Lt knee arthroscopy    . Rt total hip replacement    . Ectopic pregnancy surgery    . Laparotomy      for ectopic pregnancy  . Tonsillectomy    . Wisdom tooth extraction    . Joint replacement    . Hysteroscopy w/d&c N/A 04/26/2013    Procedure: DILATATION AND CURETTAGE /HYSTEROSCOPY;  Surgeon: Woodroe Mode, MD;  Location: [facility];  Service: Gynecology;  Laterality: N/A;  . Laparotomy N/A 06/26/2013    Procedure: EXPLORATORY LAPAROTOMY;  Surgeon: Alvino Chapel, MD;  Location: [facility];  Service: Gynecology;  Laterality: N/A;  . Abdominal hysterectomy Bilateral 06/26/2013    Procedure: TOTAL ABDOMINAL HYSTERECTOMY WITH BILATERAL SALPINGO OOPHERECTOMY WITH PELVIC LYMPHADNECTOMY;  Surgeon: Alvino Chapel, MD;  Location: [facility];  Service: Gynecology;  Laterality: Bilateral;  . Back surgery  06/26/2013  . Hernia repair     Family History  Problem Relation Age of Onset  . Heart disease Father   . Thyroid disease Father   . Heart attack Father   . Heart disease Sister     open heart surgery  . Cancer Sister     breast  . Neuropathy Sister      secondary to cancer treatment  . Breast cancer Sister   . Suicidality Brother 52  . Arthritis Mother   . Cancer Daughter   . Breast cancer Daughter    Social History   Occupational History  . Not on file.   Social History Main Topics  . Smoking status: Never Smoker   . Smokeless tobacco: Never Used  . Alcohol Use: No  . Drug Use: No  . Sexual Activity: No    Do you feel safe at home?  Yes  Dietary issues and exercise activities: Current Exercise Habits:: Exercise is limited by, Limited by:: orthopedic condition(s)  Current Dietary habits:  Tries to limit sugar intake  Objective:    Today's Vitals   06/06/15 1019  BP: 118/64  Pulse: 88  Height: 5\' 4"  (1.626 m)  Weight: 212 lb (96.163 kg)  PainSc: 8    Body mass index is 36.37 kg/(m^2).  Activities of Daily Living In your present state of health, do you have any difficulty performing the following activities: 06/06/2015 01/22/2015  Hearing? N N  Vision? N N  Difficulty concentrating or making decisions? N N  Walking or climbing stairs? Y Y  Dressing or bathing? N N  Doing errands, shopping? N N  Preparing Food and eating ? N -  Using the Toilet? N -  In the past six months, have you accidently leaked urine? N -  Do you have problems with loss of bowel control? N -  Managing your Medications? N -  Managing your Finances? N -  Housekeeping or managing your Housekeeping? N -    Are there smokers in your home (other than you)? No   Cardiac Risk Factors include: advanced age (>40men, >33 women);diabetes mellitus;dyslipidemia;family history of premature cardiovascular disease;hypertension;obesity (BMI >30kg/m2)  Depression Screen PHQ 2/9 Scores 06/06/2015 05/01/2015 01/22/2015 10/17/2014  PHQ - 2 Score 0 0 0 1    Fall Risk Fall Risk  06/06/2015 05/01/2015 01/22/2015 10/17/2014 07/11/2014  Falls in the past year? No No No No No    Cognitive Function: MMSE - Mini Mental State Exam 06/06/2015  Orientation to time 5    Orientation to Place 5  Registration 3  Attention/ Calculation 5  Recall 3  Language- name 2 objects 2  Language- repeat 1  Language- follow 3 step command 3  Language- read & follow direction 1  Write a sentence 1  Copy design 1  Total score 30    Immunizations and Health Maintenance Immunization History  Administered Date(s) Administered  . Influenza,inj,Quad PF,36+ Mos 09/26/2013, 09/09/2014  . Pneumococcal Conjugate-13 05/01/2015  . Pneumococcal Polysaccharide-23 03/29/2002  . Td 10/29/2008   There are no preventive care reminders to display for this  patient.  Patient Care Team: Chevis Pretty, FNP as PCP - General (Nurse Practitioner) Marti Sleigh, MD as Attending Physician (Gynecology)  Indicate any recent Medical Services you may have received from other than [facility] providers in the past year (date may be approximate).    Assessment:    Annual Wellness Visit  Type 2 DM Hyperlipidemia - unable to tolerate stating in past  Back and shoulder pain  Screening Tests Health Maintenance  Topic Date Due  . ZOSTAVAX  12/04/2015 (Originally 06/22/1994)  . INFLUENZA VACCINE  06/30/2015  . OPHTHALMOLOGY EXAM  07/18/2015  . HEMOGLOBIN A1C  10/31/2015  . DEXA SCAN  02/07/2016  . MAMMOGRAM  03/23/2016  . FOOT EXAM  04/30/2016  . URINE MICROALBUMIN  04/30/2016  . TETANUS/TDAP  10/29/2018  . COLONOSCOPY  02/28/2019  . PNA vac Low Risk Adult  Completed        Plan:   During the course of the visit Debra Campbell was educated and counseled about the following appropriate screening and preventive services:   Vaccines to include Pneumoccal, Influenza, Hepatitis B, Td, Zostavax - only due Zostavax - checked cost and patient declined due to cost  Colorectal cancer screening - FOBT given in office today;  Colonoscopy UTD  Cardiovascular disease screening - UTD.  LDL elevated but patient continues to refuse statins.  Also discussed PSK9 - refused also.  BP at  goal today  Bone Denisty / Osteoporosis Screening - UTD  Mammogram - UTD  Glaucoma screening / Diabetic Eye Exam - next due 07/2015 - patient has already received reminder card  Nutrition counseling - discussed limiting portion sizes and to liming high CHO foods.  Increase lean proteins, non starchy vegetables and whole grains.  Advanced Directives - UTD  Handout given and discussed with chair exercises for patient  Recommended follow up with orthopedist - patient considering but does not want at this time  Patient also has rx for lift chair but not sure where she can get - given information on local pharmacist that might be able to fill.   Goals    . HEMOGLOBIN A1C < 7.0      Increase non-starchy vegetables - carrots, green bean, squash, zucchini, tomatoes, onions, peppers, spinach and other green leafy vegetables, cabbage, lettuce, cucumbers, asparagus, okra (not fried), eggplant limit sugar and processed foods (cakes, cookies, ice cream, crackers and chips) Increase fresh fruit but limit serving sizes 1/2 cup or about the size of tennis or baseball limit red meat to no more than 1-2 times per week (serving size about the size of your palm) Choose whole grains / lean proteins - whole wheat bread, quinoa, whole grain rice (1/2 cup), fish, chicken, [place]     . Increase physical activity     Try to do chair exercises daily - refer to handout given today        Patient Instructions (the written plan) were given to the patient.   Cherre Robins, [facility]   06/06/2015

## 2015-06-15 ENCOUNTER — Other Ambulatory Visit: Payer: Self-pay | Admitting: Nurse Practitioner

## 2015-06-16 NOTE — Telephone Encounter (Signed)
no more refills without being seen  

## 2015-06-16 NOTE — Telephone Encounter (Signed)
Last seen 05/01/15 MMM  Last thyroid 09/26/13

## 2015-06-18 ENCOUNTER — Encounter: Payer: Self-pay | Admitting: *Deleted

## 2015-07-24 ENCOUNTER — Other Ambulatory Visit: Payer: Self-pay

## 2015-07-24 MED ORDER — GABAPENTIN 100 MG PO CAPS: 200.0000 mg | ORAL_CAPSULE | Freq: Every day | ORAL | Status: DC

## 2015-08-12 ENCOUNTER — Other Ambulatory Visit: Payer: Self-pay | Admitting: Nurse Practitioner

## 2015-08-13 NOTE — Telephone Encounter (Signed)
Last seen 05/01/15  MMM  Last thyroid 09/26/13   Requesting 90 day supply

## 2015-08-18 ENCOUNTER — Encounter (INDEPENDENT_AMBULATORY_CARE_PROVIDER_SITE_OTHER): Payer: Self-pay

## 2015-08-18 ENCOUNTER — Encounter: Payer: Self-pay | Admitting: Nurse Practitioner

## 2015-08-18 ENCOUNTER — Ambulatory Visit (INDEPENDENT_AMBULATORY_CARE_PROVIDER_SITE_OTHER): Payer: Medicare Other | Admitting: Nurse Practitioner

## 2015-08-18 ENCOUNTER — Encounter: Payer: Self-pay | Admitting: *Deleted

## 2015-08-18 VITALS — BP 118/67 | HR 83 | Temp 97.6°F | Ht 64.0 in | Wt 210.0 lb

## 2015-08-18 DIAGNOSIS — E034 Atrophy of thyroid (acquired): Secondary | ICD-10-CM | POA: Diagnosis not present

## 2015-08-18 DIAGNOSIS — E038 Other specified hypothyroidism: Secondary | ICD-10-CM | POA: Diagnosis not present

## 2015-08-18 DIAGNOSIS — E785 Hyperlipidemia, unspecified: Secondary | ICD-10-CM

## 2015-08-18 DIAGNOSIS — I10 Essential (primary) hypertension: Secondary | ICD-10-CM | POA: Diagnosis not present

## 2015-08-18 DIAGNOSIS — E1142 Type 2 diabetes mellitus with diabetic polyneuropathy: Secondary | ICD-10-CM

## 2015-08-18 DIAGNOSIS — M150 Primary generalized (osteo)arthritis: Secondary | ICD-10-CM | POA: Diagnosis not present

## 2015-08-18 DIAGNOSIS — M159 Polyosteoarthritis, unspecified: Secondary | ICD-10-CM

## 2015-08-18 LAB — POCT GLYCOSYLATED HEMOGLOBIN (HGB A1C): Hemoglobin A1C: 6.8

## 2015-08-18 MED ORDER — LOSARTAN POTASSIUM 50 MG PO TABS: ORAL_TABLET | ORAL | Status: DC

## 2015-08-18 MED ORDER — TRIAMCINOLONE ACETONIDE 0.1 % EX CREA: 1.0000 "application " | TOPICAL_CREAM | Freq: Two times a day (BID) | CUTANEOUS | Status: DC

## 2015-08-18 MED ORDER — GLUCOSE BLOOD VI STRP: ORAL_STRIP | Status: DC

## 2015-08-18 MED ORDER — TRAMADOL HCL 50 MG PO TABS: 50.0000 mg | ORAL_TABLET | Freq: Three times a day (TID) | ORAL | Status: DC | PRN

## 2015-08-18 NOTE — Progress Notes (Signed)
Subjective:    Patient ID: Debra Campbell, female    DOB: 17-Nov-1932, 81 y.o.   MRN: [id number]  Patient here today for follow up of chronic medical problems. Reports chronic back pain, right leg and arm pain. States she sees a specialist for the arthritis in her shoulder. Wants to go back again.   Diabetes She presents for her follow-up diabetic visit. She has type 2 diabetes mellitus. There are no hypoglycemic associated symptoms. Pertinent negatives for hypoglycemia include no headaches. Pertinent negatives for diabetes include no chest pain and no visual change. Symptoms are stable. Pertinent negatives for diabetic complications include no CVA. Risk factors for coronary artery disease include dyslipidemia, hypertension, obesity, post-menopausal and family history. Current diabetic treatment includes oral agent (monotherapy). She is compliant with treatment all of the time. She is following a diabetic diet. When asked about meal planning, she reported none. She has not had a previous visit with a dietitian. She rarely participates in exercise. Her breakfast blood glucose is taken between 9-10 am. Her breakfast blood glucose range is generally 140-180 mg/dl. Her highest blood glucose is >200 mg/dl. Her overall blood glucose range is 140-180 mg/dl. An ACE inhibitor/angiotensin II receptor blocker is being taken. She does not see a podiatrist.Eye exam is current (reports it has been a year, will schedule soon).  Hypertension This is a chronic problem. The current episode started more than 1 year ago. The problem is unchanged. The problem is controlled. Pertinent negatives include no chest pain or headaches. Risk factors for coronary artery disease include obesity, post-menopausal state, dyslipidemia and diabetes mellitus. Past treatments include diuretics. The current treatment provides significant improvement. There are no compliance problems.  There is no history of CAD/MI or CVA.  Hyperlipidemia This  is a chronic problem. The current episode started more than 1 year ago. The problem is controlled. Exacerbating diseases include diabetes and obesity. There are no known factors aggravating her hyperlipidemia. Pertinent negatives include no chest pain. There are no compliance problems.  Risk factors for coronary artery disease include diabetes mellitus, family history, dyslipidemia, hypertension, post-menopausal and obesity.  Hypothyroidism Chronic problem. Patient taking Synthroid without complications. No c/o fatigue.  Diabetic neuropathy Gabapentin working. Recently increased dose to $Remov'200mg'nkXylg$  twice a day. osteopenia  Last bone density test was 02/06/14- stable. osteoarthritis Has in multiple joints. Has trouble rising from sitting position. Needs lift chair    Review of Systems  Constitutional: Negative.   HENT: Negative.   Respiratory: Negative.   Cardiovascular: Negative.  Negative for chest pain.  Gastrointestinal: Negative.   Endocrine: Negative.   Genitourinary: Negative.   Musculoskeletal: Negative.   Skin: Negative.   Allergic/Immunologic: Negative.   Neurological: Negative.  Negative for headaches.  Hematological: Negative.   Psychiatric/Behavioral: Negative.   All other systems reviewed and are negative.      Objective:   Physical Exam  Constitutional: She is oriented to person, place, and time. She appears well-developed and well-nourished.  HENT:  Nose: Nose normal.  Mouth/Throat: Oropharynx is clear and moist.  Eyes: Conjunctivae and EOM are normal. Pupils are equal, round, and reactive to light.  Neck: Trachea normal, normal range of motion and full passive range of motion without pain. Carotid bruit is not present.  Cardiovascular: Normal rate, regular rhythm, normal heart sounds and intact distal pulses.   Pulmonary/Chest: Effort normal and breath sounds normal.  Abdominal: Soft. Bowel sounds are normal.  Musculoskeletal: Normal range of motion. She exhibits  edema (traces bil lower extremities. ).  Neurological: She is alert and oriented to person, place, and time. She has normal reflexes.     Skin: Skin is warm and dry.  Mild callus formation bil heels  Psychiatric: She has a normal mood and affect. Her behavior is normal. Judgment and thought content normal.    BP 118/67 mmHg  Pulse 83  Temp(Src) 97.6 F (36.4 C) (Oral)  Ht _0  (1.626 m)  Wt 210 lb (95.255 kg)  BMI 36.03 kg/m2  Results for orders placed or performed in visit on 08/18/15  POCT glycosylated hemoglobin (Hb A1C)  Result Value Ref Range   Hemoglobin A1C 6.8         Assessment & Plan:  1. Essential hypertension No added salt to diet. Continue with exercise - CMP14+EGFR - losartan (COZAAR) 50 MG tablet; TAKE 1 TABLET (50 MG TOTAL) BY MOUTH AT BEDTIME.  Dispense: 90 tablet; Refill: 1  2. Type 2 diabetes mellitus with diabetic polyneuropathy Continue to test CBGs at home in the am - POCT glycosylated hemoglobin (Hb A1C) - glucose blood (ACCU-CHEK AVIVA PLUS) test strip; Test 1x per day and prn. DX.E11.9  Dispense: 100 each; Refill: 12   3. Hyperlipidemia Low fat diet - Lipid panel  4. Hypothyroidism due to acquired atrophy of thyroid Continue with synthyriod  5. Primary osteoarthritis involving multiple joints Weight bearing exercise - ultram 33m 1 po q6 prn #30 0 refills   6. BMI 36.0-36.9,adult Continue with weight loss, and diet     Continue all meds Labs pending Health Maintenance reviewed Diet and exercise encouraged RTO 3 months  [name] MHassell Done FNP

## 2015-08-18 NOTE — Patient Instructions (Addendum)

## 2015-08-19 ENCOUNTER — Ambulatory Visit: Payer: Medicare Other | Admitting: Nurse Practitioner

## 2015-08-19 LAB — CMP14+EGFR
ALBUMIN: 4.1 g/dL (ref 3.5–4.7)
ALT: 21 IU/L (ref 0–32)
AST: 19 IU/L (ref 0–40)
Albumin/Globulin Ratio: 1.5 (ref 1.1–2.5)
Alkaline Phosphatase: 40 IU/L (ref 39–117)
BUN/Creatinine Ratio: 17 (ref 11–26)
BUN: 17 mg/dL (ref 8–27)
Bilirubin Total: 0.6 mg/dL (ref 0.0–1.2)
CO2: 24 mmol/L (ref 18–29)
CREATININE: 1 mg/dL (ref 0.57–1.00)
Calcium: 9.7 mg/dL (ref 8.7–10.3)
Chloride: 99 mmol/L (ref 97–108)
GFR, EST AFRICAN AMERICAN: 61 mL/min/{1.73_m2} (ref >59)
GFR, EST NON AFRICAN AMERICAN: 53 mL/min/{1.73_m2} — AB (ref >59)
Globulin, Total: 2.8 g/dL (ref 1.5–4.5)
Glucose: 176 mg/dL — ABNORMAL HIGH (ref 65–99)
Potassium: 4.5 mmol/L (ref 3.5–5.2)
Sodium: 141 mmol/L (ref 134–144)
Total Protein: 6.9 g/dL (ref 6.0–8.5)

## 2015-08-19 LAB — LIPID PANEL
CHOL/HDL RATIO: 3.6 ratio (ref 0.0–4.4)
Cholesterol, Total: 189 mg/dL (ref 100–199)
HDL: 52 mg/dL (ref >39)
LDL Calculated: 101 mg/dL — ABNORMAL HIGH (ref 0–99)
TRIGLYCERIDES: 182 mg/dL — AB (ref 0–149)
VLDL CHOLESTEROL CAL: 36 mg/dL (ref 5–40)

## 2015-09-16 ENCOUNTER — Other Ambulatory Visit: Payer: Self-pay | Admitting: Nurse Practitioner

## 2015-09-18 ENCOUNTER — Other Ambulatory Visit: Payer: Self-pay | Admitting: Nurse Practitioner

## 2015-09-23 ENCOUNTER — Other Ambulatory Visit: Payer: Self-pay | Admitting: Family Medicine

## 2015-10-02 ENCOUNTER — Encounter: Payer: Self-pay | Admitting: Gynecology

## 2015-10-02 ENCOUNTER — Ambulatory Visit: Payer: Medicare Other | Attending: Gynecology | Admitting: Gynecology

## 2015-10-02 ENCOUNTER — Other Ambulatory Visit (HOSPITAL_COMMUNITY)
Admission: RE | Admit: 2015-10-02 | Discharge: 2015-10-02 | Disposition: A | Discharge status: Home / Self Care | Payer: Medicare Other | Source: Ambulatory Visit | Attending: Gynecology | Admitting: Gynecology

## 2015-10-02 VITALS — BP 146/54 | HR 72 | Temp 98.2°F | Resp 18 | Ht 64.0 in | Wt 215.5 lb

## 2015-10-02 DIAGNOSIS — Z8542 Personal history of malignant neoplasm of other parts of uterus: Secondary | ICD-10-CM | POA: Diagnosis not present

## 2015-10-02 DIAGNOSIS — Z01411 Encounter for gynecological examination (general) (routine) with abnormal findings: Secondary | ICD-10-CM | POA: Insufficient documentation

## 2015-10-02 DIAGNOSIS — C541 Malignant neoplasm of endometrium: Secondary | ICD-10-CM | POA: Insufficient documentation

## 2015-10-02 NOTE — Progress Notes (Signed)
Consult Note: Gyn-Onc   AMEIA MORENCY 81 y.o. female  Chief Complaint  Patient presents with  . Endometrial cancer    Follow up    Assessment :  Stage IB grade 1 endometrial adenocarcinoma (July 2014). Clinically free of disease  Plan: Pap smear is obtained. The patient return to see Dr. Roselie Awkward in 6 months return to see me in one year. Signs and symptoms recurrence were reviewed with the patient. She will have mammograms annually.   Interval history: Patient returns today as previously scheduled. Since her last visit she's done well. She denies any GI GU or pelvic symptoms. Her main problem is arthritis especially in her back and hips. She saw Dr. Roselie Awkward 6 months ago.   HPI: Patient was initially seen in consultation request of Dr. Roselie Awkward regarding management of a newly diagnosed endometrial carcinoma. The patient had some abnormal bleeding approximately 3 months prior to diagnosis.. She underwent a D&C on 04/27/2013 revealing a grade 1 endometrial carcinoma in a background of atypical complex hyperplasia.  Ultrasound exam shows a uterus measures 7.9 x 4.5 x 7.3 cm.  She has a past history of a laparotomy for an ectopic pregnancy. She's also had a total hip replacement.  Patient underwent a total abdominal hysterectomy bilateral salpingo-oophorectomy and pelvic lymphadenectomy on 06/26/2013. Final pathology showed a stage IB grade 1 endometrial carcinoma. No adjuvant therapy was recommended.  Review of Systems:10 point review of systems is negative except as noted in interval history.   Vitals: Blood pressure 146/54, pulse 72, temperature 98.2 F (36.8 C), temperature source Oral, resp. rate 18, height 5\' 4"  (1.626 m), weight 215 lb 8 oz (97.75 kg), SpO2 96 %.  Physical Exam: General : The patient is a healthy woman in no acute distress.  HEENT: normocephalic, extraoccular movements normal; neck is supple without thyromegally  Lynphnodes: Supraclavicular and inguinal nodes not  enlarged  Abdomen: Soft, non-tender, no ascites, no organomegally, no masses, small umbilical hernia. Midline incision is healing well Pelvic:  EGBUS: Normal female  Vagina: Normal, no lesions  Urethra and Bladder: Normal, non-tender  Cervix: Surgically absent  Uterus: Surgically absent Bi-manual examination: Non-tender; no adenxal masses or nodularity  Rectal: normal sphincter tone, no masses, no blood  Lower extremities: No edema or varicosities. Normal range of motion      Allergies  Allergen Reactions  . Ace Inhibitors Cough  . Iohexol      Desc: HIVES 40 YEARS AGO   . Statins     myalgia  . Iodine Rash  . Penicillins Rash  . Sulfa Antibiotics Rash    Past Medical History  Diagnosis Date  . Diabetes mellitus   . Thyroid disease   . Hypertension   . SVD (spontaneous vaginal delivery)     x 3  . Hypothyroidism   . Eczema   . Hyperlipidemia     no meds  . Arthritis     legs, lower back, hands  . History of kidney stones   . Headache(784.0)     hx of  . Cancer [facility])     endometrial  . Ectopic pregnancy     Past Surgical History  Procedure Laterality Date  . Lt knee arthroscopy    . Rt total hip replacement    . Ectopic pregnancy surgery    . Laparotomy      for ectopic pregnancy  . Tonsillectomy    . Wisdom tooth extraction    . Joint replacement    . Hysteroscopy  w/d&c N/A 04/26/2013    Procedure: DILATATION AND CURETTAGE /HYSTEROSCOPY;  Surgeon: Woodroe Mode, MD;  Location: [facility];  Service: Gynecology;  Laterality: N/A;  . Laparotomy N/A 06/26/2013    Procedure: EXPLORATORY LAPAROTOMY;  Surgeon: Alvino Chapel, MD;  Location: [facility];  Service: Gynecology;  Laterality: N/A;  . Abdominal hysterectomy Bilateral 06/26/2013    Procedure: TOTAL ABDOMINAL HYSTERECTOMY WITH BILATERAL SALPINGO OOPHERECTOMY WITH PELVIC LYMPHADNECTOMY;  Surgeon: Alvino Chapel, MD;  Location: [facility];  Service: Gynecology;  Laterality: Bilateral;  . Back  surgery  06/26/2013  . Hernia repair      Current Outpatient Prescriptions  Medication Sig Dispense Refill  . acetaminophen (TYLENOL) 500 MG tablet Take 500-1,000 mg by mouth every 6 (six) hours as needed for pain.    [name] aspirin 81 MG tablet Take 81 mg by mouth every morning.     . gabapentin (NEURONTIN) 100 MG capsule TAKE 2 CAPSULES AT BEDTIME 180 capsule 1  . glucose blood (ACCU-CHEK AVIVA PLUS) test strip Test 1x per day and prn. DX.E11.9 100 each 12  . hydrochlorothiazide (HYDRODIURIL) 25 MG tablet TAKE 1 TABLET EVERY MORNING 90 tablet 0  . Lancets (ACCU-CHEK SOFT TOUCH) lancets Test BS 1 x daily and PRN DX. E11.9 100 each 12  . levothyroxine (SYNTHROID, LEVOTHROID) 175 MCG tablet TAKE 1 TABLET EVERY DAY BEFORE BREAKFAST 90 tablet 0  . losartan (COZAAR) 50 MG tablet TAKE 1 TABLET (50 MG TOTAL) BY MOUTH AT BEDTIME. 90 tablet 1  . metFORMIN (GLUCOPHAGE) 1000 MG tablet TAKE 1 TABLET TWICE DAILY WITH A MEAL 180 tablet 1  . Multiple Vitamin (MULTIVITAMIN WITH MINERALS) TABS Take 1 tablet by mouth daily.    [name] PROCTOSOL HC 2.5 % rectal cream Place 1 application rectally 2 (two) times daily. (Patient taking differently: Place 1 application rectally 2 (two) times daily as needed. ) 90 g 0  . triamcinolone cream (KENALOG) 0.1 % APPLY  TOPICALLY TWICE DAILY 30 g 1  . traMADol (ULTRAM) 50 MG tablet Take 1 tablet (50 mg total) by mouth every 8 (eight) hours as needed. (Patient not taking: Reported on 10/02/2015) 30 tablet 0   No current facility-administered medications for this visit.    Social History   Social History  . Marital Status: Married    Spouse Name: N/A  . Number of Children: N/A  . Years of Education: N/A   Occupational History  . Not on file.   Social History Main Topics  . Smoking status: Never Smoker   . Smokeless tobacco: Never Used  . Alcohol Use: No  . Drug Use: No  . Sexual Activity: No   Other Topics Concern  . Not on file   Social History Narrative     Family History  Problem Relation Age of Onset  . Heart disease Father   . Thyroid disease Father   . Heart attack Father   . Heart disease Sister     open heart surgery  . Cancer Sister     breast  . Neuropathy Sister     secondary to cancer treatment  . Breast cancer Sister   . Suicidality Brother 73  . Arthritis Mother   . Cancer Daughter   . Breast cancer Daughter       Alvino Chapel, MD 10/02/2015, 1:40 PM

## 2015-10-02 NOTE — Patient Instructions (Signed)
Plan to see Dr. Roselie Awkward in six months and Dr. Fermin Schwab in one year or sooner if needed.  We will call you with the results of your pap smear from today.

## 2015-10-02 NOTE — Addendum Note (Signed)
Addended by: Elizebeth Koller on: 10/02/2015 02:38 PM   Modules accepted: Orders

## 2015-10-08 LAB — CYTOLOGY - PAP

## 2015-10-10 ENCOUNTER — Telehealth: Payer: Self-pay | Admitting: Gynecologic Oncology

## 2015-10-10 NOTE — Telephone Encounter (Signed)
Patient informed of pap smear results: negative.  Instructed to call for any questions or concerns.

## 2015-10-13 ENCOUNTER — Ambulatory Visit (INDEPENDENT_AMBULATORY_CARE_PROVIDER_SITE_OTHER): Payer: Medicare Other

## 2015-10-13 DIAGNOSIS — Z23 Encounter for immunization: Secondary | ICD-10-CM

## 2015-10-14 ENCOUNTER — Other Ambulatory Visit: Payer: Self-pay | Admitting: Nurse Practitioner

## 2015-12-02 ENCOUNTER — Encounter: Payer: Self-pay | Admitting: Nurse Practitioner

## 2015-12-02 ENCOUNTER — Ambulatory Visit (INDEPENDENT_AMBULATORY_CARE_PROVIDER_SITE_OTHER): Payer: Medicare Other | Admitting: Nurse Practitioner

## 2015-12-02 VITALS — BP 150/88 | HR 90 | Temp 97.3°F | Ht 64.0 in | Wt 216.0 lb

## 2015-12-02 DIAGNOSIS — E034 Atrophy of thyroid (acquired): Secondary | ICD-10-CM | POA: Diagnosis not present

## 2015-12-02 DIAGNOSIS — E038 Other specified hypothyroidism: Secondary | ICD-10-CM

## 2015-12-02 DIAGNOSIS — M8580 Other specified disorders of bone density and structure, unspecified site: Secondary | ICD-10-CM

## 2015-12-02 DIAGNOSIS — E1142 Type 2 diabetes mellitus with diabetic polyneuropathy: Secondary | ICD-10-CM | POA: Diagnosis not present

## 2015-12-02 DIAGNOSIS — M150 Primary generalized (osteo)arthritis: Secondary | ICD-10-CM

## 2015-12-02 DIAGNOSIS — I10 Essential (primary) hypertension: Secondary | ICD-10-CM | POA: Diagnosis not present

## 2015-12-02 DIAGNOSIS — E785 Hyperlipidemia, unspecified: Secondary | ICD-10-CM | POA: Diagnosis not present

## 2015-12-02 DIAGNOSIS — C541 Malignant neoplasm of endometrium: Secondary | ICD-10-CM

## 2015-12-02 DIAGNOSIS — M159 Polyosteoarthritis, unspecified: Secondary | ICD-10-CM

## 2015-12-02 LAB — POCT GLYCOSYLATED HEMOGLOBIN (HGB A1C): Hemoglobin A1C: 6.8

## 2015-12-02 MED ORDER — HYDROCHLOROTHIAZIDE 25 MG PO TABS: 25.0000 mg | ORAL_TABLET | Freq: Every morning | ORAL | Status: DC

## 2015-12-02 MED ORDER — TRIAMCINOLONE ACETONIDE 0.5 % EX CREA: 1.0000 "application " | TOPICAL_CREAM | Freq: Three times a day (TID) | CUTANEOUS | Status: DC

## 2015-12-02 MED ORDER — LEVOTHYROXINE SODIUM 175 MCG PO TABS: ORAL_TABLET | ORAL | Status: DC

## 2015-12-02 NOTE — Patient Instructions (Signed)
Bone Health Bones protect organs, store calcium, and anchor muscles. Good health habits, such as eating nutritious foods and exercising regularly, are important for maintaining healthy bones. They can also help to prevent a condition that causes bones to lose density and become weak and brittle (osteoporosis). WHY IS BONE MASS IMPORTANT? Bone mass refers to the amount of bone tissue that you have. The higher your bone mass, the stronger your bones. An important step toward having healthy bones throughout life is to have strong and dense bones during childhood. A young adult who has a high bone mass is more likely to have a high bone mass later in life. Bone mass at its greatest it is called peak bone mass. A large decline in bone mass occurs in older adults. In women, it occurs about the time of menopause. During this time, it is important to practice good health habits, because if more bone is lost than what is replaced, the bones will become less healthy and more likely to break (fracture). If you find that you have a low bone mass, you may be able to prevent osteoporosis or further bone loss by changing your diet and lifestyle. HOW CAN I FIND OUT IF MY BONE MASS IS LOW? Bone mass can be measured with an X-ray test that is called a bone mineral density (BMD) test. This test is recommended for all women who are age 65 or older. It may also be recommended for men who are age 70 or older, or for people who are more likely to develop osteoporosis due to:  Having bones that break easily.  Having a long-term disease that weakens bones, such as kidney disease or rheumatoid arthritis.  Having menopause earlier than normal.  Taking medicine that weakens bones, such as steroids, thyroid hormones, or hormone treatment for breast cancer or prostate cancer.  Smoking.  Drinking three or more alcoholic drinks each day. WHAT ARE THE NUTRITIONAL RECOMMENDATIONS FOR HEALTHY BONES? To have healthy bones, you need  to get enough of the right minerals and vitamins. Most nutrition experts recommend getting these nutrients from the foods that you eat. Nutritional recommendations vary from person to person. Ask your health care provider what is healthy for you. Here are some general guidelines. Calcium Recommendations Calcium is the most important (essential) mineral for bone health. Most people can get enough calcium from their diet, but supplements may be recommended for people who are at risk for osteoporosis. Good sources of calcium include:  Dairy products, such as low-fat or nonfat milk, cheese, and yogurt.  Dark green leafy vegetables, such as bok choy and broccoli.  Calcium-fortified foods, such as orange juice, cereal, bread, soy beverages, and tofu products.  Nuts, such as almonds. Follow these recommended amounts for daily calcium intake:  Children, age 1-3: 700 mg.  Children, age 4-8: 1,000 mg.  Children, age 9-13: 1,300 mg.  Teens, age 14-18: 1,300 mg.  Adults, age 19-50: 1,000 mg.  Adults, age 51-70:  Men: 1,000 mg.  Women: 1,200 mg.  Adults, age 71 or older: 1,200 mg.  Pregnant and breastfeeding females:  Teens: 1,300 mg.  Adults: 1,000 mg. Vitamin D Recommendations Vitamin D is the most essential vitamin for bone health. It helps the body to absorb calcium. Sunlight stimulates the skin to make vitamin D, so be sure to get enough sunlight. If you live in a cold climate or you do not get outside often, your health care provider may recommend that you take vitamin D supplements. Good   sources of vitamin D in your diet include:  Egg yolks.  Saltwater fish.  Milk and cereal fortified with vitamin D. Follow these recommended amounts for daily vitamin D intake:  Children and teens, age 1-18: 600 international units.  Adults, age 50 or younger: 400-800 international units.  Adults, age 51 or older: 800-1,000 international units. Other Nutrients Other nutrients for bone  health include:  Phosphorus. This mineral is found in meat, poultry, dairy foods, nuts, and legumes. The recommended daily intake for adult men and adult women is 700 mg.  Magnesium. This mineral is found in seeds, nuts, dark green vegetables, and legumes. The recommended daily intake for adult men is 400-420 mg. For adult women, it is 310-320 mg.  Vitamin K. This vitamin is found in green leafy vegetables. The recommended daily intake is 120 mg for adult men and 90 mg for adult women. WHAT TYPE OF PHYSICAL ACTIVITY IS BEST FOR BUILDING AND MAINTAINING HEALTHY BONES? Weight-bearing and strength-building activities are important for building and maintaining peak bone mass. Weight-bearing activities cause muscles and bones to work against gravity. Strength-building activities increases muscle strength that supports bones. Weight-bearing and muscle-building activities include:  Walking and hiking.  Jogging and running.  Dancing.  Gym exercises.  Lifting weights.  Tennis and racquetball.  Climbing stairs.  Aerobics. Adults should get at least 30 minutes of moderate physical activity on most days. Children should get at least 60 minutes of moderate physical activity on most days. Ask your health care provide what type of exercise is best for you. WHERE CAN I FIND MORE INFORMATION? For more information, check out the following websites:  National Osteoporosis Foundation: http://nof.org/learn/basics  National Institutes of Health: http://www.niams.nih.gov/Health_Info/Bone/Bone_Health/bone_health_for_life.asp   This information is not intended to replace advice given to you by your health care provider. Make sure you discuss any questions you have with your health care provider.   Document Released: 02/05/2004 Document Revised: 04/01/2015 Document Reviewed: 11/20/2014 Elsevier Interactive Patient Education 2016 [employer]  

## 2015-12-02 NOTE — Progress Notes (Signed)
Subjective:    Patient ID: Debra Campbell, female    DOB: 05-29-1932, 82 y.o.   MRN: [id number]  Patient here today for follow up of chronic medical problems.   Diabetes She presents for her follow-up diabetic visit. She has type 2 diabetes mellitus. There are no hypoglycemic associated symptoms. Pertinent negatives for hypoglycemia include no headaches. Pertinent negatives for diabetes include no chest pain and no visual change. Symptoms are stable. Pertinent negatives for diabetic complications include no CVA. Risk factors for coronary artery disease include dyslipidemia, hypertension, obesity, post-menopausal and family history. Current diabetic treatment includes oral agent (monotherapy). She is compliant with treatment all of the time. She is following a diabetic diet. When asked about meal planning, she reported none. She has not had a previous visit with a dietitian. She rarely participates in exercise. Her breakfast blood glucose is taken between 9-10 am. Her breakfast blood glucose range is generally 140-180 mg/dl. Her highest blood glucose is >200 mg/dl. Her overall blood glucose range is 140-180 mg/dl. An ACE inhibitor/angiotensin II receptor blocker is being taken. She does not see a podiatrist.Eye exam is current (reports it has been a year, will schedule soon).  Hypertension This is a chronic problem. The current episode started more than 1 year ago. The problem is unchanged. The problem is controlled. Pertinent negatives include no chest pain or headaches. Risk factors for coronary artery disease include obesity, post-menopausal state, dyslipidemia and diabetes mellitus. Past treatments include diuretics. The current treatment provides significant improvement. There are no compliance problems.  There is no history of CAD/MI or CVA.  Hyperlipidemia This is a chronic problem. The current episode started more than 1 year ago. The problem is controlled. Exacerbating diseases include diabetes  and obesity. There are no known factors aggravating her hyperlipidemia. Pertinent negatives include no chest pain. There are no compliance problems.  Risk factors for coronary artery disease include diabetes mellitus, family history, dyslipidemia, hypertension, post-menopausal and obesity.  Hypothyroidism Chronic problem. Patient taking Synthroid without complications. No c/o fatigue.  Diabetic neuropathy Gabapentin working. Recently increased dose to 272m twice a day. osteopenia  Last bone density test was 02/06/14- stable. osteoarthritis Has in multiple joints. Has trouble rising from sitting position. Needs lift chair Endometrial cancer Had hysterectomy- has been released by  Oncologist.  Review of Systems  Constitutional: Negative.   HENT: Negative.   Respiratory: Negative.   Cardiovascular: Negative.  Negative for chest pain.  Gastrointestinal: Negative.   Endocrine: Negative.   Genitourinary: Negative.   Musculoskeletal: Negative.   Skin: Negative.   Allergic/Immunologic: Negative.   Neurological: Negative.  Negative for headaches.  Hematological: Negative.   Psychiatric/Behavioral: Negative.   All other systems reviewed and are negative.      Objective:   Physical Exam  Constitutional: She is oriented to person, place, and time. She appears well-developed and well-nourished.  HENT:  Nose: Nose normal.  Mouth/Throat: Oropharynx is clear and moist.  Eyes: Conjunctivae and EOM are normal. Pupils are equal, round, and reactive to light.  Neck: Trachea normal, normal range of motion and full passive range of motion without pain. Carotid bruit is not present.  Cardiovascular: Normal rate, regular rhythm, normal heart sounds and intact distal pulses.   Pulmonary/Chest: Effort normal and breath sounds normal.  Abdominal: Soft. Bowel sounds are normal.  Musculoskeletal: Normal range of motion. She exhibits edema (traces bil lower extremities. ).  Neurological: She is alert and  oriented to person, place, and time. She has normal reflexes.  Skin: Skin is warm and dry.  Mild callus formation bil heels  Psychiatric: She has a normal mood and affect. Her behavior is normal. Judgment and thought content normal.    BP 150/88 mmHg  Pulse 90  Temp(Src) 97.3 F (36.3 C) (Oral)  Ht _0  (1.626 m)  Wt 216 lb (97.977 kg)  BMI 37.06 kg/m2  Diabetic Foot Form - Detailed   Diabetic Foot Exam - detailed  Diabetic Foot exam was performed with the following findings:  Yes 12/02/2015 10:20 AM  Visual Foot Exam completed.:  Yes  Is there a history of foot ulcer?:  No  Can the patient see the bottom of their feet?:  No  Are the shoes appropriate in style and fit?:  Yes  Is there swelling or and abnormal foot shape?:  No  Are the toenails long?:  No  Are the toenails thick?:  No  Do you have pain in calf while walking?:  No  Is there a claw toe deformity?:  No  Is there elevated skin temparature?:  No  Is there limited skin dorsiflexion?:  No  Is there foot or ankle muscle weakness?:  No  Are the toenails ingrown?:  No  Normal Range of Motion:  Yes    Right posterior Tibialias:  Present Left posterior Tibialias:  Present  Right Dorsalis Pedis:  Present Left Dorsalis Pedis:  Present  Semmes-Weinstein Monofilament Test  R Foot Test Control:  Pos L Foot Test Control:  Pos  R Site 1-Great Toe:  Pos L Site 1-Great Toe:  Pos  R Site 4:  Pos L Site 4:  Pos  R Site 5:  Pos L Site 5:  Pos       Results for orders placed or performed in visit on 12/02/15  POCT glycosylated hemoglobin (Hb A1C)  Result Value Ref Range   Hemoglobin A1C 6.8           Assessment & Plan:  1. Essential hypertension Do not add salt to diet - Lipid panel - CMP14+EGFR - hydrochlorothiazide (HYDRODIURIL) 25 MG tablet; Take 1 tablet (25 mg total) by mouth every morning.  Dispense: 90 tablet; Refill: 1  2. Type 2 diabetes mellitus with diabetic polyneuropathy, unspecified long term  insulin use status (HCC) Continue carb counting - Lipid panel - POCT glycosylated hemoglobin (Hb A1C) - CMP14+EGFR  3. Hypothyroidism due to acquired atrophy of thyroid - CMP14+EGFR - levothyroxine (SYNTHROID, LEVOTHROID) 175 MCG tablet; TAKE 1 TABLET EVERY DAY BEFORE BREAKFAST  Dispense: 90 tablet; Refill: 1  4. Diabetic polyneuropathy associated with type 2 diabetes mellitus ([place]) Do not go barefooted Keep toenails trimmed  5. Primary osteoarthritis involving multiple joints   6. Osteopenia Weight bearing exercises  7. Hyperlipidemia Low fat diet  8. Morbid obesity, unspecified obesity type ([place]) Discussed diet and exercise for person with BMI >25 Will recheck weight in 3-6 months  9. Endometrial cancer [facility])    Labs pending Health maintenance reviewed Diet and exercise encouraged Continue all meds Follow up  In 3 months   [place], FNP

## 2015-12-03 LAB — CMP14+EGFR
ALK PHOS: 41 IU/L (ref 39–117)
ALT: 23 IU/L (ref 0–32)
AST: 26 IU/L (ref 0–40)
Albumin/Globulin Ratio: 1.8 (ref 1.1–2.5)
Albumin: 4.3 g/dL (ref 3.5–4.7)
BILIRUBIN TOTAL: 0.7 mg/dL (ref 0.0–1.2)
BUN / CREAT RATIO: 19 (ref 11–26)
BUN: 18 mg/dL (ref 8–27)
CHLORIDE: 99 mmol/L (ref 96–106)
CO2: 21 mmol/L (ref 18–29)
Calcium: 9.4 mg/dL (ref 8.7–10.3)
Creatinine, Ser: 0.96 mg/dL (ref 0.57–1.00)
GFR calc non Af Amer: 56 mL/min/{1.73_m2} — ABNORMAL LOW (ref >59)
GFR, EST AFRICAN AMERICAN: 64 mL/min/{1.73_m2} (ref >59)
GLUCOSE: 185 mg/dL — AB (ref 65–99)
Globulin, Total: 2.4 g/dL (ref 1.5–4.5)
POTASSIUM: 4.2 mmol/L (ref 3.5–5.2)
SODIUM: 137 mmol/L (ref 134–144)
TOTAL PROTEIN: 6.7 g/dL (ref 6.0–8.5)

## 2015-12-03 LAB — LIPID PANEL
CHOL/HDL RATIO: 3.2 ratio (ref 0.0–4.4)
Cholesterol, Total: 186 mg/dL (ref 100–199)
HDL: 59 mg/dL (ref >39)
LDL CALC: 98 mg/dL (ref 0–99)
Triglycerides: 146 mg/dL (ref 0–149)
VLDL CHOLESTEROL CAL: 29 mg/dL (ref 5–40)

## 2016-01-30 ENCOUNTER — Other Ambulatory Visit: Payer: Self-pay | Admitting: Nurse Practitioner

## 2016-03-08 ENCOUNTER — Encounter: Payer: Self-pay | Admitting: Nurse Practitioner

## 2016-03-08 ENCOUNTER — Ambulatory Visit (INDEPENDENT_AMBULATORY_CARE_PROVIDER_SITE_OTHER): Payer: Medicare Other | Admitting: Nurse Practitioner

## 2016-03-08 ENCOUNTER — Ambulatory Visit: Payer: Medicare Other

## 2016-03-08 VITALS — BP 136/90 | HR 87 | Temp 97.1°F | Ht 64.0 in | Wt 217.0 lb

## 2016-03-08 DIAGNOSIS — M150 Primary generalized (osteo)arthritis: Secondary | ICD-10-CM | POA: Diagnosis not present

## 2016-03-08 DIAGNOSIS — E038 Other specified hypothyroidism: Secondary | ICD-10-CM | POA: Diagnosis not present

## 2016-03-08 DIAGNOSIS — E785 Hyperlipidemia, unspecified: Secondary | ICD-10-CM

## 2016-03-08 DIAGNOSIS — I10 Essential (primary) hypertension: Secondary | ICD-10-CM | POA: Diagnosis not present

## 2016-03-08 DIAGNOSIS — Z780 Asymptomatic menopausal state: Secondary | ICD-10-CM | POA: Diagnosis not present

## 2016-03-08 DIAGNOSIS — M8580 Other specified disorders of bone density and structure, unspecified site: Secondary | ICD-10-CM

## 2016-03-08 DIAGNOSIS — E034 Atrophy of thyroid (acquired): Secondary | ICD-10-CM

## 2016-03-08 DIAGNOSIS — N951 Menopausal and female climacteric states: Secondary | ICD-10-CM

## 2016-03-08 DIAGNOSIS — M159 Polyosteoarthritis, unspecified: Secondary | ICD-10-CM

## 2016-03-08 DIAGNOSIS — E1142 Type 2 diabetes mellitus with diabetic polyneuropathy: Secondary | ICD-10-CM | POA: Diagnosis not present

## 2016-03-08 DIAGNOSIS — C541 Malignant neoplasm of endometrium: Secondary | ICD-10-CM

## 2016-03-08 LAB — BAYER DCA HB A1C WAIVED: HB A1C (BAYER DCA - WAIVED): 6.9 % (ref <7.0)

## 2016-03-08 MED ORDER — GABAPENTIN 100 MG PO CAPS: 200.0000 mg | ORAL_CAPSULE | Freq: Every day | ORAL | Status: DC

## 2016-03-08 NOTE — Patient Instructions (Signed)
Bone Health Bones protect organs, store calcium, and anchor muscles. Good health habits, such as eating nutritious foods and exercising regularly, are important for maintaining healthy bones. They can also help to prevent a condition that causes bones to lose density and become weak and brittle (osteoporosis). WHY IS BONE MASS IMPORTANT? Bone mass refers to the amount of bone tissue that you have. The higher your bone mass, the stronger your bones. An important step toward having healthy bones throughout life is to have strong and dense bones during childhood. A young adult who has a high bone mass is more likely to have a high bone mass later in life. Bone mass at its greatest it is called peak bone mass. A large decline in bone mass occurs in older adults. In women, it occurs about the time of menopause. During this time, it is important to practice good health habits, because if more bone is lost than what is replaced, the bones will become less healthy and more likely to break (fracture). If you find that you have a low bone mass, you may be able to prevent osteoporosis or further bone loss by changing your diet and lifestyle. HOW CAN I FIND OUT IF MY BONE MASS IS LOW? Bone mass can be measured with an X-ray test that is called a bone mineral density (BMD) test. This test is recommended for all women who are age 65 or older. It may also be recommended for men who are age 70 or older, or for people who are more likely to develop osteoporosis due to:  Having bones that break easily.  Having a long-term disease that weakens bones, such as kidney disease or rheumatoid arthritis.  Having menopause earlier than normal.  Taking medicine that weakens bones, such as steroids, thyroid hormones, or hormone treatment for breast cancer or prostate cancer.  Smoking.  Drinking three or more alcoholic drinks each day. WHAT ARE THE NUTRITIONAL RECOMMENDATIONS FOR HEALTHY BONES? To have healthy bones, you need  to get enough of the right minerals and vitamins. Most nutrition experts recommend getting these nutrients from the foods that you eat. Nutritional recommendations vary from person to person. Ask your health care provider what is healthy for you. Here are some general guidelines. Calcium Recommendations Calcium is the most important (essential) mineral for bone health. Most people can get enough calcium from their diet, but supplements may be recommended for people who are at risk for osteoporosis. Good sources of calcium include:  Dairy products, such as low-fat or nonfat milk, cheese, and yogurt.  Dark green leafy vegetables, such as bok choy and broccoli.  Calcium-fortified foods, such as orange juice, cereal, bread, soy beverages, and tofu products.  Nuts, such as almonds. Follow these recommended amounts for daily calcium intake:  Children, age 1-3: 700 mg.  Children, age 4-8: 1,000 mg.  Children, age 9-13: 1,300 mg.  Teens, age 14-18: 1,300 mg.  Adults, age 19-50: 1,000 mg.  Adults, age 51-70:  Men: 1,000 mg.  Women: 1,200 mg.  Adults, age 71 or older: 1,200 mg.  Pregnant and breastfeeding females:  Teens: 1,300 mg.  Adults: 1,000 mg. Vitamin D Recommendations Vitamin D is the most essential vitamin for bone health. It helps the body to absorb calcium. Sunlight stimulates the skin to make vitamin D, so be sure to get enough sunlight. If you live in a cold climate or you do not get outside often, your health care provider may recommend that you take vitamin D supplements. Good   sources of vitamin D in your diet include:  Egg yolks.  Saltwater fish.  Milk and cereal fortified with vitamin D. Follow these recommended amounts for daily vitamin D intake:  Children and teens, age 1-18: 600 international units.  Adults, age 50 or younger: 400-800 international units.  Adults, age 51 or older: 800-1,000 international units. Other Nutrients Other nutrients for bone  health include:  Phosphorus. This mineral is found in meat, poultry, dairy foods, nuts, and legumes. The recommended daily intake for adult men and adult women is 700 mg.  Magnesium. This mineral is found in seeds, nuts, dark green vegetables, and legumes. The recommended daily intake for adult men is 400-420 mg. For adult women, it is 310-320 mg.  Vitamin K. This vitamin is found in green leafy vegetables. The recommended daily intake is 120 mg for adult men and 90 mg for adult women. WHAT TYPE OF PHYSICAL ACTIVITY IS BEST FOR BUILDING AND MAINTAINING HEALTHY BONES? Weight-bearing and strength-building activities are important for building and maintaining peak bone mass. Weight-bearing activities cause muscles and bones to work against gravity. Strength-building activities increases muscle strength that supports bones. Weight-bearing and muscle-building activities include:  Walking and hiking.  Jogging and running.  Dancing.  Gym exercises.  Lifting weights.  Tennis and racquetball.  Climbing stairs.  Aerobics. Adults should get at least 30 minutes of moderate physical activity on most days. Children should get at least 60 minutes of moderate physical activity on most days. Ask your health care provide what type of exercise is best for you. WHERE CAN I FIND MORE INFORMATION? For more information, check out the following websites:  National Osteoporosis Foundation: http://nof.org/learn/basics  National Institutes of Health: http://www.niams.nih.gov/Health_Info/Bone/Bone_Health/bone_health_for_life.asp   This information is not intended to replace advice given to you by your health care provider. Make sure you discuss any questions you have with your health care provider.   Document Released: 02/05/2004 Document Revised: 04/01/2015 Document Reviewed: 11/20/2014 Elsevier Interactive Patient Education 2016 [employer]  

## 2016-03-08 NOTE — Progress Notes (Signed)
Subjective:    Patient ID: Debra Campbell, female    DOB: 03-23-1932, 82 y.o.   MRN: [id number]  Patient here today for follow up of chronic medical problems.  Outpatient Encounter Prescriptions as of 03/08/2016  Medication Sig  . acetaminophen (TYLENOL) 500 MG tablet Take 500-1,000 mg by mouth every 6 (six) hours as needed for pain.  [name] aspirin 81 MG tablet Take 81 mg by mouth every morning.   . gabapentin (NEURONTIN) 100 MG capsule TAKE 2 CAPSULES AT BEDTIME  . glucose blood (ACCU-CHEK AVIVA PLUS) test strip Test 1x per day and prn. DX.E11.9  . hydrochlorothiazide (HYDRODIURIL) 25 MG tablet Take 1 tablet (25 mg total) by mouth every morning.  . Lancets (ACCU-CHEK SOFT TOUCH) lancets Test BS 1 x daily and PRN DX. E11.9  . levothyroxine (SYNTHROID, LEVOTHROID) 175 MCG tablet TAKE 1 TABLET EVERY DAY BEFORE BREAKFAST  . losartan (COZAAR) 50 MG tablet TAKE 1 TABLET (50 MG TOTAL) BY MOUTH AT BEDTIME.  . metFORMIN (GLUCOPHAGE) 1000 MG tablet TAKE 1 TABLET TWICE DAILY WITH A MEAL  . Multiple Vitamin (MULTIVITAMIN WITH MINERALS) TABS Take 1 tablet by mouth daily.  [name] PROCTOSOL HC 2.5 % rectal cream Place 1 application rectally 2 (two) times daily. (Patient taking differently: Place 1 application rectally 2 (two) times daily as needed. )  . triamcinolone cream (KENALOG) 0.5 % Apply 1 application topically 3 (three) times daily.   No facility-administered encounter medications on file as of 03/08/2016.    Diabetes She presents for her follow-up diabetic visit. She has type 2 diabetes mellitus. There are no hypoglycemic associated symptoms. Pertinent negatives for hypoglycemia include no headaches. Pertinent negatives for diabetes include no chest pain and no visual change. Symptoms are stable. Pertinent negatives for diabetic complications include no CVA. Risk factors for coronary artery disease include dyslipidemia, hypertension, obesity, post-menopausal and family history. Current diabetic treatment  includes oral agent (monotherapy). She is compliant with treatment all of the time. She is following a diabetic diet. When asked about meal planning, she reported none. She has not had a previous visit with a dietitian. She rarely participates in exercise. Her breakfast blood glucose is taken between 9-10 am. Her breakfast blood glucose range is generally 140-180 mg/dl. Her highest blood glucose is >200 mg/dl. Her overall blood glucose range is 140-180 mg/dl. An ACE inhibitor/angiotensin II receptor blocker is being taken. She does not see a podiatrist.Eye exam is current (reports it has been a year, will schedule soon).  Hypertension This is a chronic problem. The current episode started more than 1 year ago. The problem is unchanged. The problem is controlled. Pertinent negatives include no chest pain or headaches. Risk factors for coronary artery disease include obesity, post-menopausal state, dyslipidemia and diabetes mellitus. Past treatments include diuretics. The current treatment provides significant improvement. There are no compliance problems.  There is no history of CAD/MI or CVA.  Hyperlipidemia This is a chronic problem. The current episode started more than 1 year ago. The problem is controlled. Exacerbating diseases include diabetes and obesity. There are no known factors aggravating her hyperlipidemia. Pertinent negatives include no chest pain. There are no compliance problems.  Risk factors for coronary artery disease include diabetes mellitus, family history, dyslipidemia, hypertension, post-menopausal and obesity.  Hypothyroidism Chronic problem. Patient taking Synthroid without complications. No c/o fatigue.  Diabetic neuropathy Gabapentin working. Recently increased dose to 218m twice a day. osteopenia  Last bone density test was 02/06/14- stable. osteoarthritis Has in multiple joints. Has trouble  rising from sitting position. Needs lift chair Endometrial cancer Had hysterectomy-  has been released by  Oncologist.  Review of Systems  Constitutional: Negative.   HENT: Negative.   Respiratory: Negative.   Cardiovascular: Negative.  Negative for chest pain.  Gastrointestinal: Negative.   Endocrine: Negative.   Genitourinary: Negative.   Musculoskeletal: Negative.   Skin: Negative.   Allergic/Immunologic: Negative.   Neurological: Negative.  Negative for headaches.  Hematological: Negative.   Psychiatric/Behavioral: Negative.   All other systems reviewed and are negative.      Objective:   Physical Exam  Constitutional: She is oriented to person, place, and time. She appears well-developed and well-nourished.  HENT:  Nose: Nose normal.  Mouth/Throat: Oropharynx is clear and moist.  Eyes: Conjunctivae and EOM are normal. Pupils are equal, round, and reactive to light.  Neck: Trachea normal, normal range of motion and full passive range of motion without pain. Carotid bruit is not present.  Cardiovascular: Normal rate, regular rhythm, normal heart sounds and intact distal pulses.   Pulmonary/Chest: Effort normal and breath sounds normal.  Abdominal: Soft. Bowel sounds are normal.  Musculoskeletal: Normal range of motion. She exhibits edema (traces bil lower extremities. ).  Neurological: She is alert and oriented to person, place, and time. She has normal reflexes.     Skin: Skin is warm and dry.  Mild callus formation bil heels  Psychiatric: She has a normal mood and affect. Her behavior is normal. Judgment and thought content normal.    BP 136/90 mmHg  Pulse 87  Temp(Src) 97.1 F (36.2 C) (Oral)  Ht _0  (1.626 m)  Wt 217 lb (98.431 kg)  BMI 37.23 kg/m2  Hgba1c discussed at appointment 6.9%      Assessment & Plan:  1. Essential hypertension Do not add salt to diet - CMP14+EGFR  2. Type 2 diabetes mellitus with diabetic polyneuropathy, unspecified long term insulin use status (HCC) Continue to watch carbs in diet - Bayer DCA Hb A1c  Waived  3. Hyperlipidemia Low fat diet - Lipid panel  4. Hypothyroidism due to acquired atrophy of thyroid  5. Diabetic polyneuropathy associated with type 2 diabetes mellitus ([place]) Do not go barefooted - gabapentin (NEURONTIN) 100 MG capsule; Take 2 capsules (200 mg total) by mouth at bedtime.  Dispense: 180 capsule; Refill: 1  6. Osteopenia Weight bearing exercises  7. Primary osteoarthritis involving multiple joints  8. Endometrial cancer ([place])  9. Morbid obesity, unspecified obesity type ([place]) Discussed diet and exercise for person with BMI >25 Will recheck weight in 3-6 months  10. Post menopausal syndrome - DG Bone Density; Future    Labs pending Health maintenance reviewed Diet and exercise encouraged Continue all meds Follow up  In 3 month   [place], FNP

## 2016-03-09 LAB — CMP14+EGFR
A/G RATIO: 1.7 (ref 1.2–2.2)
ALT: 39 IU/L — AB (ref 0–32)
AST: 27 IU/L (ref 0–40)
Albumin: 4 g/dL (ref 3.5–4.7)
Alkaline Phosphatase: 45 IU/L (ref 39–117)
BUN/Creatinine Ratio: 16 (ref 12–28)
BUN: 15 mg/dL (ref 8–27)
Bilirubin Total: 0.5 mg/dL (ref 0.0–1.2)
CALCIUM: 9.2 mg/dL (ref 8.7–10.3)
CO2: 23 mmol/L (ref 18–29)
Chloride: 99 mmol/L (ref 96–106)
Creatinine, Ser: 0.96 mg/dL (ref 0.57–1.00)
GFR calc Af Amer: 64 mL/min/{1.73_m2} (ref >59)
GFR, EST NON AFRICAN AMERICAN: 56 mL/min/{1.73_m2} — AB (ref >59)
GLUCOSE: 182 mg/dL — AB (ref 65–99)
Globulin, Total: 2.4 g/dL (ref 1.5–4.5)
POTASSIUM: 4.5 mmol/L (ref 3.5–5.2)
Sodium: 139 mmol/L (ref 134–144)
Total Protein: 6.4 g/dL (ref 6.0–8.5)

## 2016-03-09 LAB — LIPID PANEL
Chol/HDL Ratio: 3.5 ratio units (ref 0.0–4.4)
Cholesterol, Total: 196 mg/dL (ref 100–199)
HDL: 56 mg/dL (ref >39)
LDL CALC: 110 mg/dL — AB (ref 0–99)
TRIGLYCERIDES: 151 mg/dL — AB (ref 0–149)
VLDL CHOLESTEROL CAL: 30 mg/dL (ref 5–40)

## 2016-03-15 ENCOUNTER — Other Ambulatory Visit: Payer: Self-pay

## 2016-03-15 ENCOUNTER — Other Ambulatory Visit: Payer: Self-pay | Admitting: Nurse Practitioner

## 2016-03-15 ENCOUNTER — Telehealth: Payer: Self-pay | Admitting: Nurse Practitioner

## 2016-03-15 MED ORDER — LOSARTAN POTASSIUM 50 MG PO TABS: 50.0000 mg | ORAL_TABLET | Freq: Every day | ORAL | Status: DC

## 2016-03-15 NOTE — Telephone Encounter (Signed)
done

## 2016-04-05 ENCOUNTER — Other Ambulatory Visit: Payer: Self-pay | Admitting: Nurse Practitioner

## 2016-04-14 ENCOUNTER — Other Ambulatory Visit: Payer: Self-pay | Admitting: Nurse Practitioner

## 2016-04-16 ENCOUNTER — Ambulatory Visit: Payer: Medicare Other | Attending: Gynecology | Admitting: Gynecology

## 2016-04-16 VITALS — BP 124/53 | HR 97 | Temp 98.0°F | Resp 18 | Ht 64.0 in | Wt 219.7 lb

## 2016-04-16 DIAGNOSIS — M1990 Unspecified osteoarthritis, unspecified site: Secondary | ICD-10-CM | POA: Diagnosis not present

## 2016-04-16 DIAGNOSIS — Z7984 Long term (current) use of oral hypoglycemic drugs: Secondary | ICD-10-CM | POA: Diagnosis not present

## 2016-04-16 DIAGNOSIS — Z880 Allergy status to penicillin: Secondary | ICD-10-CM | POA: Insufficient documentation

## 2016-04-16 DIAGNOSIS — Z888 Allergy status to other drugs, medicaments and biological substances status: Secondary | ICD-10-CM | POA: Insufficient documentation

## 2016-04-16 DIAGNOSIS — Z8542 Personal history of malignant neoplasm of other parts of uterus: Secondary | ICD-10-CM

## 2016-04-16 DIAGNOSIS — E039 Hypothyroidism, unspecified: Secondary | ICD-10-CM | POA: Insufficient documentation

## 2016-04-16 DIAGNOSIS — Z87442 Personal history of urinary calculi: Secondary | ICD-10-CM | POA: Diagnosis not present

## 2016-04-16 DIAGNOSIS — E785 Hyperlipidemia, unspecified: Secondary | ICD-10-CM | POA: Insufficient documentation

## 2016-04-16 DIAGNOSIS — N3941 Urge incontinence: Secondary | ICD-10-CM | POA: Diagnosis not present

## 2016-04-16 DIAGNOSIS — I10 Essential (primary) hypertension: Secondary | ICD-10-CM | POA: Diagnosis not present

## 2016-04-16 DIAGNOSIS — Z96641 Presence of right artificial hip joint: Secondary | ICD-10-CM | POA: Diagnosis not present

## 2016-04-16 DIAGNOSIS — E119 Type 2 diabetes mellitus without complications: Secondary | ICD-10-CM | POA: Diagnosis not present

## 2016-04-16 DIAGNOSIS — Z7982 Long term (current) use of aspirin: Secondary | ICD-10-CM | POA: Insufficient documentation

## 2016-04-16 DIAGNOSIS — Z9889 Other specified postprocedural states: Secondary | ICD-10-CM | POA: Diagnosis not present

## 2016-04-16 DIAGNOSIS — R32 Unspecified urinary incontinence: Secondary | ICD-10-CM | POA: Diagnosis not present

## 2016-04-16 DIAGNOSIS — Z79899 Other long term (current) drug therapy: Secondary | ICD-10-CM | POA: Diagnosis not present

## 2016-04-16 DIAGNOSIS — C541 Malignant neoplasm of endometrium: Secondary | ICD-10-CM | POA: Diagnosis not present

## 2016-04-16 NOTE — Progress Notes (Signed)
Consult Note: Gyn-Onc   Debra Campbell 82 y.o. female  Chief Complaint  Patient presents with  . Follow-up  . endometrial cancer    Assessment :  Stage IB grade 1 endometrial adenocarcinoma (July 2014). Clinically free of disease  Plan: The patient returns see me in one year. We will discontinue doing Pap smears. Should patient wish any management of her incontinence she'll contact us and we will refer her to Dr. Maryland Pink..   Interval history: Patient returns today as previously scheduled. Since her last visit she's done well. Her primary complaint is urinary incontinence. On questioning this sounds more like a urge incontinence problem. The patient does wear a pad. She denies any other GI GU or pelvic symptoms.  [name]   HPI: Patient was initially seen in consultation request of Dr. Roselie Awkward regarding management of a newly diagnosed endometrial carcinoma. The patient had some abnormal bleeding approximately 3 months prior to diagnosis.. She underwent a D&C on 04/27/2013 revealing a grade 1 endometrial carcinoma in a background of atypical complex hyperplasia.  Ultrasound exam shows a uterus measures 7.9 x 4.5 x 7.3 cm.  She has a past history of a laparotomy for an ectopic pregnancy. She's also had a total hip replacement.  Patient underwent a total abdominal hysterectomy bilateral salpingo-oophorectomy and pelvic lymphadenectomy on 06/26/2013. Final pathology showed a stage IB grade 1 endometrial carcinoma. No adjuvant therapy was recommended.  Review of Systems:10 point review of systems is negative except as noted in interval history.   Vitals: Blood pressure 124/53, pulse 97, temperature 98 F (36.7 C), temperature source Oral, resp. rate 18, height 5\' 4"  (1.626 m), weight 219 lb 11.2 oz (99.655 kg), SpO2 99 %.  Physical Exam: General : The patient is a healthy woman in no acute distress.  HEENT: normocephalic, extraoccular movements normal; neck is supple without thyromegally   Lynphnodes: Supraclavicular and inguinal nodes not enlarged  Abdomen: Soft, non-tender, no ascites, no organomegally, no masses, small umbilical hernia. Midline incision is healing well Pelvic:  EGBUS: Normal female  Vagina: Normal, no lesions  Urethra and Bladder: Normal, non-tender  Cervix: Surgically absent  Uterus: Surgically absent Bi-manual examination: Non-tender; no adenxal masses or nodularity  Rectal: normal sphincter tone, no masses, no blood  Lower extremities: No edema or varicosities. Normal range of motion      Allergies  Allergen Reactions  . Ace Inhibitors Cough  . Iohexol      Desc: HIVES 40 YEARS AGO   . Statins     myalgia  . Iodine Rash  . Penicillins Rash  . Sulfa Antibiotics Rash    Past Medical History  Diagnosis Date  . Diabetes mellitus   . Thyroid disease   . Hypertension   . SVD (spontaneous vaginal delivery)     x 3  . Hypothyroidism   . Eczema   . Hyperlipidemia     no meds  . Arthritis     legs, lower back, hands  . History of kidney stones   . Headache(784.0)     hx of  . Cancer [facility])     endometrial  . Ectopic pregnancy     Past Surgical History  Procedure Laterality Date  . Lt knee arthroscopy    . Rt total hip replacement    . Ectopic pregnancy surgery    . Laparotomy      for ectopic pregnancy  . Tonsillectomy    . Wisdom tooth extraction    . Joint replacement    .  Hysteroscopy w/d&c N/A 04/26/2013    Procedure: DILATATION AND CURETTAGE /HYSTEROSCOPY;  Surgeon: Woodroe Mode, MD;  Location: [facility];  Service: Gynecology;  Laterality: N/A;  . Laparotomy N/A 06/26/2013    Procedure: EXPLORATORY LAPAROTOMY;  Surgeon: Alvino Chapel, MD;  Location: [facility];  Service: Gynecology;  Laterality: N/A;  . Abdominal hysterectomy Bilateral 06/26/2013    Procedure: TOTAL ABDOMINAL HYSTERECTOMY WITH BILATERAL SALPINGO OOPHERECTOMY WITH PELVIC LYMPHADNECTOMY;  Surgeon: Alvino Chapel, MD;  Location: [facility];   Service: Gynecology;  Laterality: Bilateral;  . Back surgery  06/26/2013  . Hernia repair      Current Outpatient Prescriptions  Medication Sig Dispense Refill  . acetaminophen (TYLENOL) 500 MG tablet Take 500-1,000 mg by mouth every 6 (six) hours as needed for pain.    [name] aspirin 81 MG tablet Take 81 mg by mouth every morning.     . gabapentin (NEURONTIN) 100 MG capsule Take 2 capsules (200 mg total) by mouth at bedtime. 180 capsule 1  . glucose blood (ACCU-CHEK AVIVA PLUS) test strip Test 1x per day and prn. DX.E11.9 100 each 12  . hydrochlorothiazide (HYDRODIURIL) 25 MG tablet TAKE 1 TABLET EVERY MORNING 90 tablet 1  . Lancets (ACCU-CHEK SOFT TOUCH) lancets Test BS 1 x daily and PRN DX. E11.9 100 each 12  . levothyroxine (SYNTHROID, LEVOTHROID) 175 MCG tablet TAKE 1 TABLET EVERY DAY BEFORE BREAKFAST 90 tablet 1  . losartan (COZAAR) 50 MG tablet Take 1 tablet (50 mg total) by mouth at bedtime. 30 tablet 0  . metFORMIN (GLUCOPHAGE) 1000 MG tablet TAKE 1 TABLET TWICE DAILY WITH  MEALS 180 tablet 1  . Multiple Vitamin (MULTIVITAMIN WITH MINERALS) TABS Take 1 tablet by mouth daily.    [name] PROCTOSOL HC 2.5 % rectal cream Place 1 application rectally 2 (two) times daily. (Patient not taking: Reported on 04/16/2016) 90 g 0  . triamcinolone cream (KENALOG) 0.5 % Apply 1 application topically 3 (three) times daily. (Patient not taking: Reported on 04/16/2016) 30 g 0   No current facility-administered medications for this visit.    Social History   Social History  . Marital Status: Married    Spouse Name: N/A  . Number of Children: N/A  . Years of Education: N/A   Occupational History  . Not on file.   Social History Main Topics  . Smoking status: Never Smoker   . Smokeless tobacco: Never Used  . Alcohol Use: No  . Drug Use: No  . Sexual Activity: No   Other Topics Concern  . Not on file   Social History Narrative    Family History  Problem Relation Age of Onset  . Heart disease  Father   . Thyroid disease Father   . Heart attack Father   . Heart disease Sister     open heart surgery  . Cancer Sister     breast  . Neuropathy Sister     secondary to cancer treatment  . Breast cancer Sister   . Suicidality Brother 54  . Arthritis Mother   . Cancer Daughter   . Breast cancer Daughter       Alvino Chapel, MD 04/16/2016, 11:28 AM

## 2016-04-16 NOTE — Patient Instructions (Signed)
Plan to follow up with Dr. Fermin Schwab in one year or sooner if needed.  Please call [phone] in November or December 2017 to schedule for May 2018.

## 2016-06-07 ENCOUNTER — Ambulatory Visit (INDEPENDENT_AMBULATORY_CARE_PROVIDER_SITE_OTHER): Payer: Medicare Other | Admitting: Nurse Practitioner

## 2016-06-07 ENCOUNTER — Encounter: Payer: Self-pay | Admitting: Nurse Practitioner

## 2016-06-07 VITALS — BP 123/79 | HR 97 | Temp 97.7°F | Ht 64.0 in | Wt 211.0 lb

## 2016-06-07 DIAGNOSIS — E034 Atrophy of thyroid (acquired): Secondary | ICD-10-CM

## 2016-06-07 DIAGNOSIS — C541 Malignant neoplasm of endometrium: Secondary | ICD-10-CM | POA: Diagnosis not present

## 2016-06-07 DIAGNOSIS — R609 Edema, unspecified: Secondary | ICD-10-CM

## 2016-06-07 DIAGNOSIS — M150 Primary generalized (osteo)arthritis: Secondary | ICD-10-CM

## 2016-06-07 DIAGNOSIS — E038 Other specified hypothyroidism: Secondary | ICD-10-CM | POA: Diagnosis not present

## 2016-06-07 DIAGNOSIS — I10 Essential (primary) hypertension: Secondary | ICD-10-CM | POA: Diagnosis not present

## 2016-06-07 DIAGNOSIS — I499 Cardiac arrhythmia, unspecified: Secondary | ICD-10-CM | POA: Diagnosis not present

## 2016-06-07 DIAGNOSIS — E1142 Type 2 diabetes mellitus with diabetic polyneuropathy: Secondary | ICD-10-CM | POA: Diagnosis not present

## 2016-06-07 DIAGNOSIS — E785 Hyperlipidemia, unspecified: Secondary | ICD-10-CM | POA: Diagnosis not present

## 2016-06-07 DIAGNOSIS — M8580 Other specified disorders of bone density and structure, unspecified site: Secondary | ICD-10-CM

## 2016-06-07 DIAGNOSIS — M159 Polyosteoarthritis, unspecified: Secondary | ICD-10-CM

## 2016-06-07 LAB — CMP14+EGFR
A/G RATIO: 1.8 (ref 1.2–2.2)
ALT: 16 IU/L (ref 0–32)
AST: 16 IU/L (ref 0–40)
Albumin: 4.2 g/dL (ref 3.5–4.7)
Alkaline Phosphatase: 39 IU/L (ref 39–117)
BILIRUBIN TOTAL: 0.5 mg/dL (ref 0.0–1.2)
BUN/Creatinine Ratio: 15 (ref 12–28)
BUN: 15 mg/dL (ref 8–27)
CALCIUM: 9.6 mg/dL (ref 8.7–10.3)
CHLORIDE: 98 mmol/L (ref 96–106)
CO2: 25 mmol/L (ref 18–29)
Creatinine, Ser: 1.01 mg/dL — ABNORMAL HIGH (ref 0.57–1.00)
GFR calc Af Amer: 60 mL/min/{1.73_m2} (ref >59)
GFR calc non Af Amer: 52 mL/min/{1.73_m2} — ABNORMAL LOW (ref >59)
GLOBULIN, TOTAL: 2.4 g/dL (ref 1.5–4.5)
Glucose: 162 mg/dL — ABNORMAL HIGH (ref 65–99)
POTASSIUM: 4.6 mmol/L (ref 3.5–5.2)
SODIUM: 140 mmol/L (ref 134–144)
Total Protein: 6.6 g/dL (ref 6.0–8.5)

## 2016-06-07 LAB — LIPID PANEL
CHOL/HDL RATIO: 2.8 ratio (ref 0.0–4.4)
CHOLESTEROL TOTAL: 168 mg/dL (ref 100–199)
HDL: 61 mg/dL (ref >39)
LDL Calculated: 82 mg/dL (ref 0–99)
TRIGLYCERIDES: 127 mg/dL (ref 0–149)
VLDL Cholesterol Cal: 25 mg/dL (ref 5–40)

## 2016-06-07 LAB — BAYER DCA HB A1C WAIVED: HB A1C (BAYER DCA - WAIVED): 6.9 % (ref <7.0)

## 2016-06-07 MED ORDER — METFORMIN HCL 1000 MG PO TABS: 1000.0000 mg | ORAL_TABLET | Freq: Two times a day (BID) | ORAL | Status: DC

## 2016-06-07 MED ORDER — HYDROCHLOROTHIAZIDE 25 MG PO TABS: 25.0000 mg | ORAL_TABLET | Freq: Every morning | ORAL | Status: DC

## 2016-06-07 MED ORDER — FUROSEMIDE 20 MG PO TABS: 20.0000 mg | ORAL_TABLET | Freq: Every day | ORAL | Status: DC

## 2016-06-07 MED ORDER — LOSARTAN POTASSIUM 50 MG PO TABS: 50.0000 mg | ORAL_TABLET | Freq: Every day | ORAL | Status: DC

## 2016-06-07 MED ORDER — LEVOTHYROXINE SODIUM 175 MCG PO TABS: 175.0000 ug | ORAL_TABLET | Freq: Every day | ORAL | Status: DC

## 2016-06-07 NOTE — Progress Notes (Addendum)
Subjective:    Patient ID: Debra Campbell, female    DOB: Sep 14, 1932, 82 y.o.   MRN: [id number]  Patient here today for follow up of chronic medical problems.  Outpatient Encounter Prescriptions as of 06/07/2016  Medication Sig  . acetaminophen (TYLENOL) 500 MG tablet Take 500-1,000 mg by mouth every 6 (six) hours as needed for pain.  [name] aspirin 81 MG tablet Take 81 mg by mouth every morning.   . gabapentin (NEURONTIN) 100 MG capsule Take 2 capsules (200 mg total) by mouth at bedtime.  [name] glucose blood (ACCU-CHEK AVIVA PLUS) test strip Test 1x per day and prn. DX.E11.9  . hydrochlorothiazide (HYDRODIURIL) 25 MG tablet TAKE 1 TABLET EVERY MORNING  . Lancets (ACCU-CHEK SOFT TOUCH) lancets Test BS 1 x daily and PRN DX. E11.9  . levothyroxine (SYNTHROID, LEVOTHROID) 175 MCG tablet TAKE 1 TABLET EVERY DAY BEFORE BREAKFAST  . losartan (COZAAR) 50 MG tablet Take 1 tablet (50 mg total) by mouth at bedtime.  . metFORMIN (GLUCOPHAGE) 1000 MG tablet TAKE 1 TABLET TWICE DAILY WITH  MEALS  . Multiple Vitamin (MULTIVITAMIN WITH MINERALS) TABS Take 1 tablet by mouth daily.  [name] PROCTOSOL HC 2.5 % rectal cream Place 1 application rectally 2 (two) times daily.  [name] triamcinolone cream (KENALOG) 0.5 % Apply 1 application topically 3 (three) times daily.   No facility-administered encounter medications on file as of 06/07/2016.    Diabetes She presents for her follow-up diabetic visit. She has type 2 diabetes mellitus. There are no hypoglycemic associated symptoms. Pertinent negatives for hypoglycemia include no headaches. Pertinent negatives for diabetes include no chest pain and no visual change. Symptoms are stable. Pertinent negatives for diabetic complications include no CVA. Risk factors for coronary artery disease include dyslipidemia, hypertension, obesity, post-menopausal and family history. Current diabetic treatment includes oral agent (monotherapy). She is compliant with treatment all of the time. She is  following a diabetic diet. When asked about meal planning, she reported none. She has not had a previous visit with a dietitian. She rarely participates in exercise. Her breakfast blood glucose is taken between 9-10 am. Her breakfast blood glucose range is generally 140-180 mg/dl. Her highest blood glucose is >200 mg/dl. Her overall blood glucose range is 140-180 mg/dl. An ACE inhibitor/angiotensin II receptor blocker is being taken. She does not see a podiatrist.Eye exam is current (reports it has been a year, will schedule soon).  Hypertension This is a chronic problem. The current episode started more than 1 year ago. The problem is unchanged. The problem is controlled. Pertinent negatives include no chest pain or headaches. Risk factors for coronary artery disease include obesity, post-menopausal state, dyslipidemia and diabetes mellitus. Past treatments include diuretics. The current treatment provides significant improvement. There are no compliance problems.  There is no history of CAD/MI or CVA.  Hyperlipidemia This is a chronic problem. The current episode started more than 1 year ago. The problem is controlled. Exacerbating diseases include diabetes and obesity. There are no known factors aggravating her hyperlipidemia. Pertinent negatives include no chest pain. There are no compliance problems.  Risk factors for coronary artery disease include diabetes mellitus, family history, dyslipidemia, hypertension, post-menopausal and obesity.  Hypothyroidism Chronic problem. Patient taking Synthroid without complications. No c/o fatigue.  Diabetic neuropathy Gabapentin working. Recently increased dose to 295m twice a day. osteopenia  Last bone density test was 02/06/14- stable. osteoarthritis Has in multiple joints. Has trouble rising from sitting position. Left knee and hip pain that has gotten worse. Endometrial  cancer Had hysterectomy- has been released by  Oncologist.  Review of Systems   Constitutional: Negative.   HENT: Negative.   Respiratory: Negative.   Cardiovascular: Negative.  Negative for chest pain.  Gastrointestinal: Negative.   Endocrine: Negative.   Genitourinary: Negative.   Musculoskeletal: Negative.   Skin: Negative.   Allergic/Immunologic: Negative.   Neurological: Negative.  Negative for headaches.  Hematological: Negative.   Psychiatric/Behavioral: Negative.   All other systems reviewed and are negative.      Objective:   Physical Exam  Constitutional: She is oriented to person, place, and time. She appears well-developed and well-nourished.  HENT:  Nose: Nose normal.  Mouth/Throat: Oropharynx is clear and moist.  Eyes: Conjunctivae and EOM are normal. Pupils are equal, round, and reactive to light.  Neck: Trachea normal, normal range of motion and full passive range of motion without pain. Carotid bruit is not present.  Cardiovascular: Normal rate, regular rhythm, normal heart sounds and intact distal pulses.   Pulmonary/Chest: Effort normal and breath sounds normal.  Abdominal: Soft. Bowel sounds are normal.  Musculoskeletal: Normal range of motion. She exhibits edema (traces bil lower extremities. ).  Left  Knee edema- mild effusion- crepitus on flexion and extension  Neurological: She is alert and oriented to person, place, and time. She has normal reflexes.     Skin: Skin is warm and dry.  Mild callus formation bil heels  Psychiatric: She has a normal mood and affect. Her behavior is normal. Judgment and thought content normal.    BP 123/79 mmHg  Pulse 97  Temp(Src) 97.7 F (36.5 C) (Oral)  Ht 5' 4"  (1.626 m)  Wt 211 lb (95.709 kg)  BMI 36.20 kg/m2  hgba1c 6.9%- unchanged from previous  EKG unchanged from previous    Assessment & Plan:  1. Essential hypertension Do not add salt to diet Stop HCTZ- cannot take with lasix Keep diary of blood pressures at home - CMP14+EGFR - losartan (COZAAR) 50 MG tablet; Take 1 tablet (50  mg total) by mouth at bedtime.  Dispense: 90 tablet; Refill: 1   2. Type 2 diabetes mellitus with diabetic polyneuropathy, unspecified long term insulin use status (HCC) Continue to watch carbs in diet - Bayer DCA Hb A1c Waived - Microalbumin / creatinine urine ratio - metFORMIN (GLUCOPHAGE) 1000 MG tablet; Take 1 tablet (1,000 mg total) by mouth 2 (two) times daily with a meal.  Dispense: 180 tablet; Refill: 1  3. Hyperlipidemia Low fta diet - Lipid panel  4. Hypothyroidism due to acquired atrophy of thyroid - levothyroxine (SYNTHROID, LEVOTHROID) 175 MCG tablet; Take 1 tablet (175 mcg total) by mouth daily before breakfast.  Dispense: 90 tablet; Refill: 1  5. Diabetic polyneuropathy associated with type 2 diabetes mellitus ([place])  6. Primary osteoarthritis involving multiple joints - Ambulatory referral to Orthopedic Surgery  7. Osteopenia  8. Endometrial cancer ([place])  9. Morbid obesity, unspecified obesity type ([place]) Discussed diet and exercise for person with BMI >25 Will recheck weight in 3-6 months  10. Peripheral edema Elevate legs when sitting - furosemide (LASIX) 20 MG tablet; Take 1 tablet (20 mg total) by mouth daily.  Dispense: 90 tablet; Refill: 1    Labs pending Health maintenance reviewed Diet and exercise encouraged Continue all meds Follow up  In 3 month   [place], FNP

## 2016-06-07 NOTE — Patient Instructions (Signed)

## 2016-06-07 NOTE — Addendum Note (Signed)
Addended by: Chevis Pretty on: 06/07/2016 09:06 AM   Modules accepted: Orders

## 2016-06-08 LAB — MICROALBUMIN / CREATININE URINE RATIO
Creatinine, Urine: 164.5 mg/dL
MICROALB/CREAT RATIO: 8.7 mg/g{creat} (ref 0.0–30.0)
Microalbumin, Urine: 14.3 ug/mL

## 2016-06-28 ENCOUNTER — Encounter: Payer: Self-pay | Admitting: *Deleted

## 2016-07-12 DIAGNOSIS — M1712 Unilateral primary osteoarthritis, left knee: Secondary | ICD-10-CM | POA: Diagnosis not present

## 2016-07-12 DIAGNOSIS — M170 Bilateral primary osteoarthritis of knee: Secondary | ICD-10-CM | POA: Diagnosis not present

## 2016-07-21 ENCOUNTER — Other Ambulatory Visit: Payer: Self-pay | Admitting: Nurse Practitioner

## 2016-07-21 DIAGNOSIS — E1142 Type 2 diabetes mellitus with diabetic polyneuropathy: Secondary | ICD-10-CM

## 2016-09-07 DIAGNOSIS — M170 Bilateral primary osteoarthritis of knee: Secondary | ICD-10-CM | POA: Diagnosis not present

## 2016-09-22 ENCOUNTER — Telehealth: Payer: Self-pay | Admitting: Nurse Practitioner

## 2016-09-23 NOTE — Telephone Encounter (Signed)
Patient aware that she will need to be seen for surgical clearance and she has appointment with Ronnald Collum on Tuesday.

## 2016-09-28 ENCOUNTER — Encounter: Payer: Self-pay | Admitting: Nurse Practitioner

## 2016-09-28 ENCOUNTER — Ambulatory Visit (INDEPENDENT_AMBULATORY_CARE_PROVIDER_SITE_OTHER): Payer: Medicare Other | Admitting: Nurse Practitioner

## 2016-09-28 VITALS — BP 139/85 | HR 106 | Temp 97.5°F | Ht 64.0 in | Wt 219.0 lb

## 2016-09-28 DIAGNOSIS — E1142 Type 2 diabetes mellitus with diabetic polyneuropathy: Secondary | ICD-10-CM | POA: Diagnosis not present

## 2016-09-28 DIAGNOSIS — I10 Essential (primary) hypertension: Secondary | ICD-10-CM

## 2016-09-28 DIAGNOSIS — E785 Hyperlipidemia, unspecified: Secondary | ICD-10-CM | POA: Diagnosis not present

## 2016-09-28 DIAGNOSIS — I482 Chronic atrial fibrillation, unspecified: Secondary | ICD-10-CM

## 2016-09-28 DIAGNOSIS — Z23 Encounter for immunization: Secondary | ICD-10-CM | POA: Diagnosis not present

## 2016-09-28 DIAGNOSIS — R609 Edema, unspecified: Secondary | ICD-10-CM | POA: Diagnosis not present

## 2016-09-28 DIAGNOSIS — I4821 Permanent atrial fibrillation: Secondary | ICD-10-CM | POA: Insufficient documentation

## 2016-09-28 DIAGNOSIS — E034 Atrophy of thyroid (acquired): Secondary | ICD-10-CM | POA: Diagnosis not present

## 2016-09-28 DIAGNOSIS — I4819 Other persistent atrial fibrillation: Secondary | ICD-10-CM

## 2016-09-28 HISTORY — DX: Other persistent atrial fibrillation: I48.19

## 2016-09-28 LAB — BAYER DCA HB A1C WAIVED: HB A1C: 6.5 % (ref <7.0)

## 2016-09-28 MED ORDER — LOSARTAN POTASSIUM 50 MG PO TABS: 50.0000 mg | ORAL_TABLET | Freq: Every day | ORAL | 1 refills | Status: DC

## 2016-09-28 MED ORDER — ASPIRIN EC 325 MG PO TBEC: 325.0000 mg | DELAYED_RELEASE_TABLET | Freq: Every day | ORAL | 0 refills | Status: DC

## 2016-09-28 MED ORDER — LEVOTHYROXINE SODIUM 175 MCG PO TABS: 175.0000 ug | ORAL_TABLET | Freq: Every day | ORAL | 1 refills | Status: DC

## 2016-09-28 MED ORDER — METFORMIN HCL 1000 MG PO TABS: 1000.0000 mg | ORAL_TABLET | Freq: Two times a day (BID) | ORAL | 1 refills | Status: DC

## 2016-09-28 MED ORDER — GABAPENTIN 300 MG PO CAPS: 300.0000 mg | ORAL_CAPSULE | Freq: Every day | ORAL | 1 refills | Status: DC

## 2016-09-28 MED ORDER — GABAPENTIN 100 MG PO CAPS: 300.0000 mg | ORAL_CAPSULE | Freq: Every day | ORAL | 1 refills | Status: DC

## 2016-09-28 MED ORDER — FUROSEMIDE 20 MG PO TABS: 20.0000 mg | ORAL_TABLET | Freq: Every day | ORAL | 1 refills | Status: DC

## 2016-09-28 NOTE — Patient Instructions (Addendum)

## 2016-09-28 NOTE — Progress Notes (Signed)
Subjective:    Patient ID: Debra Campbell, female    DOB: 1932/11/19, 82 y.o.   MRN: [id number]  Patient here today for follow up of chronic medical problems, a medical clearance for her up coming left knee replacement surgery.  Outpatient Encounter Prescriptions as of 09/28/2016  Medication Sig  . acetaminophen (TYLENOL) 500 MG tablet Take 500-1,000 mg by mouth every 6 (six) hours as needed for pain.  [name] aspirin 81 MG tablet Take 81 mg by mouth every morning.   . furosemide (LASIX) 20 MG tablet Take 1 tablet (20 mg total) by mouth daily.  [name] gabapentin (NEURONTIN) 100 MG capsule TAKE 2 CAPSULES (200 MG TOTAL) BY MOUTH AT BEDTIME.  [name] glucose blood (ACCU-CHEK AVIVA PLUS) test strip Test 1x per day and prn. DX.E11.9  . hydrochlorothiazide (HYDRODIURIL) 25 MG tablet Take 1 tablet (25 mg total) by mouth every morning.  . Lancets (ACCU-CHEK SOFT TOUCH) lancets Test BS 1 x daily and PRN DX. E11.9  . levothyroxine (SYNTHROID, LEVOTHROID) 175 MCG tablet Take 1 tablet (175 mcg total) by mouth daily before breakfast.  . losartan (COZAAR) 50 MG tablet Take 1 tablet (50 mg total) by mouth at bedtime.  . metFORMIN (GLUCOPHAGE) 1000 MG tablet Take 1 tablet (1,000 mg total) by mouth 2 (two) times daily with a meal.  . Multiple Vitamin (MULTIVITAMIN WITH MINERALS) TABS Take 1 tablet by mouth daily.  [name] PROCTOSOL HC 2.5 % rectal cream Place 1 application rectally 2 (two) times daily.  [name] triamcinolone cream (KENALOG) 0.5 % Apply 1 application topically 3 (three) times daily.   No facility-administered encounter medications on file as of 09/28/2016.     Diabetes  She presents for her follow-up diabetic visit. She has type 2 diabetes mellitus. There are no hypoglycemic associated symptoms. Pertinent negatives for hypoglycemia include no headaches. Pertinent negatives for diabetes include no chest pain and no visual change. Symptoms are stable. Pertinent negatives for diabetic complications include no CVA. Risk  factors for coronary artery disease include dyslipidemia, hypertension, obesity, post-menopausal and family history. Current diabetic treatment includes oral agent (monotherapy). She is compliant with treatment all of the time. She is following a diabetic diet. When asked about meal planning, she reported none. She has not had a previous visit with a dietitian. She rarely participates in exercise. Her breakfast blood glucose is taken between 9-10 am. Her breakfast blood glucose range is generally 140-180 mg/dl. Her highest blood glucose is >200 mg/dl. Her overall blood glucose range is 140-180 mg/dl. An ACE inhibitor/angiotensin II receptor blocker is being taken. She does not see a podiatrist.Eye exam is current (reports it has been a year, will schedule soon).  Hypertension  This is a chronic problem. The current episode started more than 1 year ago. The problem is unchanged. The problem is controlled. Pertinent negatives include no chest pain or headaches. Risk factors for coronary artery disease include obesity, post-menopausal state, dyslipidemia and diabetes mellitus. Past treatments include diuretics. The current treatment provides significant improvement. There are no compliance problems.  There is no history of CAD/MI or CVA.  Hyperlipidemia  This is a chronic problem. The current episode started more than 1 year ago. The problem is controlled. Exacerbating diseases include diabetes and obesity. There are no known factors aggravating her hyperlipidemia. Pertinent negatives include no chest pain. There are no compliance problems.  Risk factors for coronary artery disease include diabetes mellitus, family history, dyslipidemia, hypertension, post-menopausal and obesity.  Hypothyroidism Chronic problem. Patient taking Synthroid without  complications. No c/o fatigue.  Diabetic neuropathy Prescribed dose of Gabapentin 200  mg at bed time not working. Patient was taking 300 mg, dose increased to 300  mg  at bed time. osteopenia Last bone density test was 02/06/14- stable. osteoarthritis Has in multiple joints. Has trouble rising from sitting position. Left knee and hip pain that has gotten worse. Endometrial cancer Had hysterectomy- has been released by  Oncologist.  Review of Systems  Constitutional: Negative.   HENT: Negative.   Respiratory: Negative.   Cardiovascular: Negative.  Negative for chest pain.  Gastrointestinal: Negative.   Endocrine: Negative.   Genitourinary: Negative.   Musculoskeletal: Negative.   Skin: Negative.   Allergic/Immunologic: Negative.   Neurological: Negative.  Negative for headaches.  Hematological: Negative.   Psychiatric/Behavioral: Negative.   All other systems reviewed and are negative.      Objective:   Physical Exam  Constitutional: She is oriented to person, place, and time. She appears well-developed and well-nourished.  HENT:  Nose: Nose normal.  Mouth/Throat: Oropharynx is clear and moist.  Eyes: Conjunctivae and EOM are normal. Pupils are equal, round, and reactive to light.  Neck: Trachea normal, normal range of motion and full passive range of motion without pain. Carotid bruit is not present.  Cardiovascular: Normal rate, normal heart sounds and intact distal pulses.   Irregular heart rate  Pulmonary/Chest: Effort normal and breath sounds normal.  Abdominal: Soft. Bowel sounds are normal.  Musculoskeletal: Normal range of motion. She exhibits edema (2+  bil lower extremities. ).  Left  Knee edema- mild effusion- crepitus on flexion and extension  Neurological: She is alert and oriented to person, place, and time. She has normal reflexes.     Skin: Skin is warm and dry.  Mild callus formation bil heels  Psychiatric: She has a normal mood and affect. Her behavior is normal. Judgment and thought content normal.    BP 139/85   Pulse (!) 106   Temp 97.5 F (36.4 C) (Oral)   Ht _0  (1.626 m)   Wt 219 lb (99.3 kg)   BMI 37.59  kg/m    hgba1c 6.5% down from 6.9%    EKG- Afib  preliminary reading by Loyal Gambler Braidyn Scorsone, RN,BSN,  AGNP Student CHF- 0 HTN-1 Age>70-1 Diabetes-1 Hx stroke (2)-0 Vascular disease-1 Age >65-1 Sex- female-1  total -6 Assessment & Plan:  1. Essential hypertension Low salt diet, do not add extra salt to meals - CMP14+EGFR - losartan (COZAAR) 50 MG tablet; Take 1 tablet (50 mg total) by mouth at bedtime.  Dispense: 90 tablet; Refill: 1 - EKG 12-Lead  2. Type 2 diabetes mellitus with diabetic polyneuropathy, unspecified long term insulin use status (HCC) Low carb diet - Bayer DCA Hb A1c Waived - Microalbumin / creatinine urine ratio - metFORMIN (GLUCOPHAGE) 1000 MG tablet; Take 1 tablet (1,000 mg total) by mouth 2 (two) times daily with a meal.  Dispense: 180 tablet; Refill: 1  3. Hyperlipidemia, unspecified hyperlipidemia type Low fat diet - Lipid panel  4. Peripheral edema Elevate legs while sitting - furosemide (LASIX) 20 MG tablet; Take 1 tablet (20 mg total) by mouth daily.  Dispense: 90 tablet; Refill: 1  5. Diabetic polyneuropathy associated with type 2 diabetes mellitus (HCC)  - gabapentin (NEURONTIN) 300 MG capsule; Take 1 capsule (300 mg total) by mouth at bedtime.  Dispense: 90 capsule; Refill: 1  6. Hypothyroidism due to acquired atrophy of thyroid  - levothyroxine (SYNTHROID, LEVOTHROID) 175 MCG tablet; Take 1 tablet (  175 mcg total) by mouth daily before breakfast.  Dispense: 90 tablet; Refill: 1  7. New onset atrial fib - referral to cardiology Needs to increase aspirin to 325 until sees cardiology.  Flu shot administered Labs pending Health maintenance reviewed Diet and exercise encouraged Continue all meds Follow up  in 3 months   Jari Favre, RN,BSN, [place], FNP

## 2016-09-29 LAB — CMP14+EGFR
A/G RATIO: 1.7 (ref 1.2–2.2)
ALBUMIN: 4.4 g/dL (ref 3.5–4.7)
ALT: 19 IU/L (ref 0–32)
AST: 20 IU/L (ref 0–40)
Alkaline Phosphatase: 47 IU/L (ref 39–117)
BILIRUBIN TOTAL: 0.6 mg/dL (ref 0.0–1.2)
BUN / CREAT RATIO: 18 (ref 12–28)
BUN: 18 mg/dL (ref 8–27)
CHLORIDE: 99 mmol/L (ref 96–106)
CO2: 24 mmol/L (ref 18–29)
Calcium: 9.4 mg/dL (ref 8.7–10.3)
Creatinine, Ser: 0.99 mg/dL (ref 0.57–1.00)
GFR calc non Af Amer: 53 mL/min/{1.73_m2} — ABNORMAL LOW (ref >59)
GFR, EST AFRICAN AMERICAN: 61 mL/min/{1.73_m2} (ref >59)
GLOBULIN, TOTAL: 2.6 g/dL (ref 1.5–4.5)
Glucose: 147 mg/dL — ABNORMAL HIGH (ref 65–99)
POTASSIUM: 4.8 mmol/L (ref 3.5–5.2)
SODIUM: 140 mmol/L (ref 134–144)
TOTAL PROTEIN: 7 g/dL (ref 6.0–8.5)

## 2016-09-29 LAB — LIPID PANEL
Chol/HDL Ratio: 2.7 ratio units (ref 0.0–4.4)
Cholesterol, Total: 186 mg/dL (ref 100–199)
HDL: 69 mg/dL (ref >39)
LDL Calculated: 95 mg/dL (ref 0–99)
Triglycerides: 108 mg/dL (ref 0–149)
VLDL Cholesterol Cal: 22 mg/dL (ref 5–40)

## 2016-09-29 LAB — MICROALBUMIN / CREATININE URINE RATIO
Creatinine, Urine: 148.2 mg/dL
Microalb/Creat Ratio: 6.9 mg/g creat (ref 0.0–30.0)
Microalbumin, Urine: 10.2 ug/mL

## 2016-10-04 ENCOUNTER — Telehealth: Payer: Self-pay | Admitting: Nurse Practitioner

## 2016-10-06 NOTE — Telephone Encounter (Signed)
This was done on 10/01/16

## 2016-10-13 ENCOUNTER — Ambulatory Visit (INDEPENDENT_AMBULATORY_CARE_PROVIDER_SITE_OTHER): Payer: Medicare Other | Admitting: Cardiology

## 2016-10-13 ENCOUNTER — Encounter: Payer: Self-pay | Admitting: Cardiology

## 2016-10-13 VITALS — BP 134/80 | HR 71 | Ht 64.0 in | Wt 222.0 lb

## 2016-10-13 DIAGNOSIS — I4891 Unspecified atrial fibrillation: Secondary | ICD-10-CM | POA: Diagnosis not present

## 2016-10-13 DIAGNOSIS — I481 Persistent atrial fibrillation: Secondary | ICD-10-CM

## 2016-10-13 DIAGNOSIS — Z01810 Encounter for preprocedural cardiovascular examination: Secondary | ICD-10-CM

## 2016-10-13 DIAGNOSIS — I10 Essential (primary) hypertension: Secondary | ICD-10-CM | POA: Diagnosis not present

## 2016-10-13 DIAGNOSIS — I4819 Other persistent atrial fibrillation: Secondary | ICD-10-CM

## 2016-10-13 DIAGNOSIS — E1142 Type 2 diabetes mellitus with diabetic polyneuropathy: Secondary | ICD-10-CM

## 2016-10-13 MED ORDER — METOPROLOL SUCCINATE ER 25 MG PO TB24
25.0000 mg | ORAL_TABLET | Freq: Every day | ORAL | 3 refills | Status: DC
Start: 2016-10-13 — End: 2017-07-12

## 2016-10-13 NOTE — Patient Instructions (Addendum)
START  METOPROLOL SUCCINATE 25 MG ONE TABLET BY MOUTH DAILY.  SCHEDULE [facility] has requested that you have an echocardiogram. Echocardiography is a painless test that uses sound waves to create images of your heart. It provides your doctor with information about the size and shape of your heart and how well your heart's chambers and valves are working. This procedure takes approximately one hour. There are no restrictions for this procedure.    OKAY TO HAVE SURGERY - LOW RISK  WILL NOT NEED ANY FURTHER TESTING OTHER THAN ECHO  PRIOR TO SURGERY.   Your physician recommends that you schedule a follow-up appointment in the middle of  JAN 2018  With Dr Ellyn Hack.  If you need a refill on your cardiac medications before your next appointment, please call your pharmacy.

## 2016-10-13 NOTE — Progress Notes (Signed)
PCP: Chevis Pretty, FNP  Clinic Note: Chief Complaint  Patient presents with  . New Patient (Initial Visit)    Pt states burning in legs @ night, no other Sx stated.  . Medical Clearance    Pre Op Clerance (Knee Surgery)  . Atrial Fibrillation    New Diagnosis    HPI: ZAILYNN MCCOMBE is a 82 y.o. female with a PMH below who presents today for atrial fibrillation. She also has history of hypertension, hyperlipidemia and diabetes with diabetic neuropathy.  GERDA ARAKELYAN was last seen on October 31 by her PCP - A. fib noted and referred for cardiology evaluation. They increased her aspirin 325 mg daily.  Recent Hospitalizations: None  Studies Reviewed: None available  Interval History: Ercilia is a very pleasant 82 year old woman with a recent exacerbation of her left knee arthritis pain. She is undergoing evaluation for possible knee replacement surgery. She is referred because of a recent diagnosis of atrial fibrillation. This was noted for the first time I guess by her PCP recently. The patient herself does not admit any sensation whatsoever of irregular heartbeats or palpitations. She has not noticed any sensation of any resting or exertional dyspnea, but does note that she hasn't noted much walking because of her knee now. She has mild lower summary edema bilaterally, but this is chronic. No PND or orthopnea associated with it.Relatively well controlled with low-dose Lasix. She's not had any chest tightness or pressure with rest or exertion.  Remainder of Cardiac Review of Symptoms as follows: No palpitations, lightheadedness, dizziness, weakness or syncope/near syncope. No TIA/amaurosis fugax symptoms. No melena, hematochezia, hematuria, or epstaxis. No claudication.  ROS: A comprehensive was performed. Review of Systems  Constitutional: Negative for chills, fever and malaise/fatigue.  HENT: Negative for congestion and sinus pain.   Eyes: Negative.   Respiratory:  Negative.   Cardiovascular: Negative.        Per history of present illness  Gastrointestinal: Negative for blood in stool, constipation, heartburn and melena.  Genitourinary: Negative for hematuria.  Musculoskeletal: Positive for joint pain (Mostly her left knee this pain really keeps her from evening and will sleep for any prolonged period time. She actually the pelvis sleep in a chair to avoid putting excessive pressure. ). Negative for falls.  Skin: Negative.   Neurological: Negative for dizziness, focal weakness and loss of consciousness.  Endo/Heme/Allergies: Negative for environmental allergies.  Psychiatric/Behavioral: Negative for depression and memory loss. The patient is not nervous/anxious and does not have insomnia.   All other systems reviewed and are negative.   Past Medical History:  Diagnosis Date  . Arthritis    legs, lower back, hands  . [place]    endometrial  . Diabetes mellitus   . Ectopic pregnancy   . Eczema   . Headache(784.0)    hx of  . History of kidney stones   . Hyperlipidemia    no meds  . Hypertension   . Hypothyroidism   . Persistent atrial fibrillation ([place]) 09/28/2016   Relatively new Dx: Asymptomatic.  Rate controlled without medication.  . SVD (spontaneous vaginal delivery)    x 3  . Thyroid disease     Past Surgical History:  Procedure Laterality Date  . ABDOMINAL HYSTERECTOMY Bilateral 06/26/2013   Procedure: TOTAL ABDOMINAL HYSTERECTOMY WITH BILATERAL SALPINGO OOPHERECTOMY WITH PELVIC LYMPHADNECTOMY;  Surgeon: Alvino Chapel, MD;  Location: [facility];  Service: Gynecology;  Laterality: Bilateral;  . BACK SURGERY  06/26/2013  . ECTOPIC PREGNANCY  SURGERY    . HERNIA REPAIR    . HYSTEROSCOPY W/D&C N/A 04/26/2013   Procedure: DILATATION AND CURETTAGE /HYSTEROSCOPY;  Surgeon: Woodroe Mode, MD;  Location: [facility];  Service: Gynecology;  Laterality: N/A;  . JOINT REPLACEMENT    . LAPAROTOMY     for ectopic pregnancy  .  LAPAROTOMY N/A 06/26/2013   Procedure: EXPLORATORY LAPAROTOMY;  Surgeon: Alvino Chapel, MD;  Location: [facility];  Service: Gynecology;  Laterality: N/A;  . lt knee arthroscopy    . rt total hip replacement    . TONSILLECTOMY    . WISDOM TOOTH EXTRACTION     Current Meds  Medication Sig  . acetaminophen (TYLENOL) 500 MG tablet Take 500-1,000 mg by mouth every 6 (six) hours as needed for pain.  [name] aspirin EC 325 MG tablet Take 1 tablet (325 mg total) by mouth daily.  . furosemide (LASIX) 20 MG tablet Take 1 tablet (20 mg total) by mouth daily.  [name] gabapentin (NEURONTIN) 100 MG capsule Take 300 mg by mouth at bedtime.  [name] glucose blood (ACCU-CHEK AVIVA PLUS) test strip Test 1x per day and prn. DX.E11.9  . Lancets (ACCU-CHEK SOFT TOUCH) lancets Test BS 1 x daily and PRN DX. E11.9  . levothyroxine (SYNTHROID, LEVOTHROID) 175 MCG tablet Take 1 tablet (175 mcg total) by mouth daily before breakfast.  . losartan (COZAAR) 50 MG tablet Take 1 tablet (50 mg total) by mouth at bedtime.  . metFORMIN (GLUCOPHAGE) 1000 MG tablet Take 1 tablet (1,000 mg total) by mouth 2 (two) times daily with a meal.  . Multiple Vitamin (MULTIVITAMIN WITH MINERALS) TABS Take 1 tablet by mouth daily.  [name] PROCTOSOL HC 2.5 % rectal cream Place 1 application rectally 2 (two) times daily.  [name] triamcinolone cream (KENALOG) 0.5 % Apply 1 application topically 3 (three) times daily.   Allergies  Allergen Reactions  . Ace Inhibitors Cough  . Iohexol      Desc: HIVES 40 YEARS AGO   . Statins     myalgia  . Iodine Rash  . Penicillins Rash  . Sulfa Antibiotics Rash    Social History   Social History  . Marital status: Married    Spouse name: N/A  . Number of children: N/A  . Years of education: N/A   Social History Main Topics  . Smoking status: Never Smoker  . Smokeless tobacco: Never Used  . Alcohol use No  . Drug use: No  . Sexual activity: No   Other Topics Concern  . None   Social History Narrative    . None   Family History  Problem Relation Age of Onset  . Heart disease Father   . Thyroid disease Father   . Heart attack Father   . Heart disease Sister     open heart surgery  . Cancer Sister     breast  . Neuropathy Sister     secondary to cancer treatment  . Breast cancer Sister   . Suicidality Brother 19  . Arthritis Mother   . Cancer Daughter   . Breast cancer Daughter     Wt Readings from Last 3 Encounters:  10/13/16 100.7 kg (222 lb)  09/28/16 99.3 kg (219 lb)  06/07/16 95.7 kg (211 lb)    PHYSICAL EXAM BP 134/80   Pulse 71   Ht 5\' 4"  (1.626 m)   Wt 100.7 kg (222 lb)   BMI 38.11 kg/m  General appearance: alert, cooperative, appears stated age, no distress  and moderately obese; well groomed. Pleasant mood and affect. Neck: no adenopathy, no carotid bruit and no JVD Lungs: clear to auscultation bilaterally, normal percussion bilaterally and non-labored Heart: Regular rate with irregularly irregular rhythm. Normal S1 and S2. No murmur, click, rub or gallop Abdomen: soft, non-tender; bowel sounds normal; no masses,  no organomegaly; no HJR Extremities: extremities normal, atraumatic, no cyanosis, and edema trace bilateral ankle Pulses: 2+ and symmetric;  Skin: mobility and turgor normal, no evidence of bleeding or bruising and no lesions noted o Neurologic: Mental status: Alert, oriented, thought content appropriate; notably antalgic gait favoring her left leg. She uses a cane. Cranial nerves: normal (II-XII grossly intact)    Adult ECG Report - Not checked EKG from PCP on October 31: Atrial fibrillation with rate of 95 bpm. PVC. Poor R-wave progression with questionable septal Q waves, cannot exclude prior septal infarct. --> Considered to be new diagnosis of A. fib.  Other studies Reviewed: Additional studies/ records that were reviewed today include:  Recent Labs:   Lab Results  Component Value Date   CHOL 186 09/28/2016   HDL 69 09/28/2016   LDLCALC  95 09/28/2016   TRIG 108 09/28/2016   CHOLHDL 2.7 09/28/2016     Chemistry      Component Value Date/Time   NA 140 09/28/2016 0817   K 4.8 09/28/2016 0817   CL 99 09/28/2016 0817   CO2 24 09/28/2016 0817   BUN 18 09/28/2016 0817   CREATININE 0.99 09/28/2016 0817      Component Value Date/Time   CALCIUM 9.4 09/28/2016 0817   ALKPHOS 47 09/28/2016 0817   AST 20 09/28/2016 0817   ALT 19 09/28/2016 0817   BILITOT 0.6 09/28/2016 0817      ASSESSMENT / PLAN: Problem List Items Addressed This Visit    Essential hypertension (Chronic)    Relatively stable blood pressure. She should go tolerate low-dose Toprol as part of her preop risk reduction.      Relevant Medications   metoprolol succinate (TOPROL XL) 25 MG 24 hr tablet   Type 2 diabetes mellitus with diabetic polyneuropathy (HCC) (Chronic)   Morbid obesity (HCC) (Chronic)    Hopefully if for able to have her knee operated on she'll be on get back into doing exercise. We will then go assess truly if she has any symptoms of A. fib.      Persistent atrial fibrillation (HCC) (Chronic)    Basilar fact that she was totally asymptomatic when this was found, and continues to be asymptomatic, we have no way of knowing how long this is been going on for. As she is asymptomatic I would not pursue any significant evaluation prior to her having any surgery which is relatively low risk surgery.  She is not requiring any rate control agent and has adequate rate control. We will be on monitor her postoperatively in case she does go fast. I will start Toprol 25 mg for underlying rate control and blood pressure control.  Since her EKG also has "possible anterior infarct age undetermined, and we are not able to assess if she has any dyspnea, I think is not unreasonable check a 2-D echocardiogram in order to get a baseline assessment of her EF and any potential wall motion abnormalities. An abnormal echocardiogram may make [name] rethink our  preoperative assessment. She is not having any symptoms of heart failure or angina, therefore although traditionally we would check a stress test, I probably would not D1 her to having  a much needed knee surgery done as this would not change our overall management of her given this relatively surreptitious finding of A. fib.  This patients CHA2DS2-VASc Score and unadjusted Ischemic Stroke Rate (% per year) is equal to 7.2 % stroke rate/year from a score of 5  Above score calculated as 1 point each if present [CHF, HTN, DM, Vascular=MI/PAD/Aortic Plaque, Age if 18-74, or Female]; 2points each if present [Age > 75, or Stroke/TIA/TE]  At this point I think I would continue with aspirin until postop. At that point I would probably consider treatment dose Xarelto or ELIQUIS pending her insurance company. Usually postop she is treated with a low-dose DOAC for DVT prophylaxis. I would discharge her on treatment dose.  She has relatively normal renal function, and I think it DOAC would probably be better than warfarin.      Relevant Medications   metoprolol succinate (TOPROL XL) 25 MG 24 hr tablet   New onset a-fib [facility]) - Primary    First diagnosed on October 31, but asymptomatic. We will check an echocardiogram to get a baseline EF, but would not do any further evaluation until she has had her knee surgery. Is actually angina symptoms and with a relatively normal echo, I probably would not pursue ischemic evaluation given her age and lack of symptoms.      Relevant Medications   metoprolol succinate (TOPROL XL) 25 MG 24 hr tablet   Other Relevant Orders   ECHOCARDIOGRAM COMPLETE    Other Visit Diagnoses    Pre-operative cardiovascular examination       Relevant Orders   ECHOCARDIOGRAM COMPLETE      Current medicines are reviewed at length with the patient today. (+/- concerns) NONE The following changes have been made:    Patient Instructions  START  METOPROLOL SUCCINATE 25 MG ONE  TABLET BY MOUTH DAILY.  SCHEDULE [place] [facility] has requested that you have an echocardiogram. Echocardiography is a painless test that uses sound waves to create images of your heart. It provides your doctor with information about the size and shape of your heart and how well your heart's chambers and valves are working. This procedure takes approximately one hour. There are no restrictions for this procedure.    OKAY TO HAVE SURGERY - LOW RISK  WILL NOT NEED ANY FURTHER TESTING OTHER THAN ECHO  PRIOR TO SURGERY.   Your physician recommends that you schedule a follow-up appointment in the middle of  JAN 2018  With Dr Ellyn Hack.  If you need a refill on your cardiac medications before your next appointment, please call your pharmacy.      Studies Ordered:   Orders Placed This Encounter  Procedures  . ECHOCARDIOGRAM COMPLETE      Glenetta Hew, M.D., M.S. Interventional Cardiologist   Pager # [phone] Phone # [phone] [address] [place], [place] [address]

## 2016-10-14 ENCOUNTER — Encounter: Payer: Self-pay | Admitting: Cardiology

## 2016-10-14 DIAGNOSIS — Z01810 Encounter for preprocedural cardiovascular examination: Secondary | ICD-10-CM | POA: Insufficient documentation

## 2016-10-14 NOTE — Assessment & Plan Note (Signed)
Relatively stable blood pressure. She should go tolerate low-dose Toprol as part of her preop risk reduction.

## 2016-10-14 NOTE — Assessment & Plan Note (Addendum)
She is having a knee surgery which is by definition low risk for cardiac outcomes. She does have diabetes but not on insulin. No history of stroke. No active heart failure or angina symptoms and asymptomatic from an A. fib standpoint.  Based on knee dated below, my recommendation would be to check a 2-D echocardiogram to ensure that there is no underlying the fistula dysfunction, but I would not pursue ischemic evaluation unless the echo was grossly abnormal with severely reduced EF. This would only serve to delay a much needed surgery. In the absence of angina or heart failure symptoms, she would probably be a low risk patient for low risk surgery. Anticoagulation can be initiated postop.  I have started low-dose Toprol for cardiovascular risk reduction and preop rate control.  PREOPERATIVE CARDIAC RISK ASSESSMENT   Revised Cardiac Risk Index:  High Risk Surgery: no; knee  Defined as Intraperitoneal, intrathoracic or suprainguinal vascular  Active CAD: no; no angina Sx  CHF: no; no PND,orhtopnea or DOE  Cerebrovascular Disease: no;   Diabetes: yes; On Insulin: no  CKD (Cr >~ 2): no  Total: 0 Estimated Risk of Adverse Outcome: LOW RISK PATIENT FOR A LOW RISK SURGERY (Has rate controlled, asymptomatic Afib & will be on a stable BB dose.  Estimated Risk of MI, PE, VF/VT (Cardiac Arrest), Complete Heart Block: <1 % - reduced by 50% with initiation of BB dose > 2 weeks pre-op.   ACC/AHA Guidelines for "Clearance":  Step 1 - Need for Emergency Surgery: No: but urgent  If Yes - go straight to OR with perioperative surveillance  Step 2 - Active Cardiac Conditions (Unstable Angina, Decompensated HF, Significant  Arrhytmias - Complete HB, Mobitz II, Symptomatic VT or SVT, Severe Aortic Stenosis - mean gradient > 40 mmHg, Valve area < 1.0 cm2):   No:   If Yes - Evaluate & Treat per ACC/AHA Guidelines  Step 3 -  Low Risk Surgery: Yes  If Yes --> proceed to OR  If No --> Step  4  Step 4 - Functional Capacity >= 4 METS without symptoms: Yes - prior to her Knee pain getting too severe.  If Yes --> proceed to OR  If No --> Step 5  Step 5 --  Clinical Risk Factors (CRF) - Score 0 as per above.  No CRFs: Yes  If Yes --> Proceed to OR  Further testing will not change management

## 2016-10-14 NOTE — Assessment & Plan Note (Signed)
Hopefully if for able to have her knee operated on she'll be on get back into doing exercise. We will then go assess truly if she has any symptoms of A. fib.

## 2016-10-14 NOTE — Assessment & Plan Note (Signed)
First diagnosed on October 31, but asymptomatic. We will check an echocardiogram to get a baseline EF, but would not do any further evaluation until she has had her knee surgery. Is actually angina symptoms and with a relatively normal echo, I probably would not pursue ischemic evaluation given her age and lack of symptoms.

## 2016-10-14 NOTE — Assessment & Plan Note (Addendum)
Basilar fact that she was totally asymptomatic when this was found, and continues to be asymptomatic, we have no way of knowing how long this is been going on for. As she is asymptomatic I would not pursue any significant evaluation prior to her having any surgery which is relatively low risk surgery.  She is not requiring any rate control agent and has adequate rate control. We will be on monitor her postoperatively in case she does go fast. I will start Toprol 25 mg for underlying rate control and blood pressure control.  Since her EKG also has "possible anterior infarct age undetermined, and we are not able to assess if she has any dyspnea, I think is not unreasonable check a 2-D echocardiogram in order to get a baseline assessment of her EF and any potential wall motion abnormalities. An abnormal echocardiogram may make [name] rethink our preoperative assessment. She is not having any symptoms of heart failure or angina, therefore although traditionally we would check a stress test, I probably would not D1 her to having a much needed knee surgery done as this would not change our overall management of her given this relatively surreptitious finding of A. fib.  This patients CHA2DS2-VASc Score and unadjusted Ischemic Stroke Rate (% per year) is equal to 7.2 % stroke rate/year from a score of 5  Above score calculated as 1 point each if present [CHF, HTN, DM, Vascular=MI/PAD/Aortic Plaque, Age if 30-74, or Female]; 2points each if present [Age > 75, or Stroke/TIA/TE]  At this point I think I would continue with aspirin until postop. At that point I would probably consider treatment dose Xarelto or ELIQUIS pending her insurance company. Usually postop she is treated with a low-dose DOAC for DVT prophylaxis. I would discharge her on treatment dose.  She has relatively normal renal function, and I think it DOAC would probably be better than warfarin.

## 2016-10-22 ENCOUNTER — Ambulatory Visit (HOSPITAL_COMMUNITY)
Admission: RE | Admit: 2016-10-22 | Discharge: 2016-10-22 | Disposition: A | Discharge status: Home / Self Care | Payer: Medicare Other | Source: Ambulatory Visit | Attending: Cardiology | Admitting: Cardiology

## 2016-10-22 DIAGNOSIS — Z01810 Encounter for preprocedural cardiovascular examination: Secondary | ICD-10-CM | POA: Insufficient documentation

## 2016-10-22 DIAGNOSIS — I4891 Unspecified atrial fibrillation: Secondary | ICD-10-CM | POA: Diagnosis not present

## 2016-10-22 LAB — ECHOCARDIOGRAM COMPLETE
FS: 35 % (ref 28–44)
IV/PV OW: 0.94
LA diam index: 2.39 cm/m2
LA vol A4C: 82.6 ml
LA vol index: 40.1 mL/m2
LASIZE: 49 mm
LAVOL: 82.3 mL
LEFT ATRIUM END SYS DIAM: 49 mm
LV PW d: 10.3 mm — AB (ref 0.6–1.1)
LVOT area: 3.14 cm2
LVOT diameter: 20 mm
RV TAPSE: 19.5 mm

## 2016-10-22 NOTE — Progress Notes (Signed)
  Echocardiogram 2D Echocardiogram has been performed.  Debra Campbell 10/22/2016, 2:42 PM

## 2016-10-25 NOTE — Progress Notes (Signed)
Echo results: Good news: Essentially normal echocardiogram and normal pump function and normal valve function.  EF: 60-65%. No regional wall motion abnormalities = No signs to suggest heart attack.  Glenetta Hew, MD

## 2016-10-26 ENCOUNTER — Telehealth: Payer: Self-pay | Admitting: Cardiology

## 2016-10-26 NOTE — Telephone Encounter (Signed)
Echo results: Good news: Essentially normal echocardiogram and normal pump function and normal valve function.  EF: 60-65%. No regional wall motion abnormalities = No signs to suggest heart attack.  Debra Hew, MD  Pt notified of Echo result, I do not see where anyone called her.

## 2016-10-26 NOTE — Telephone Encounter (Signed)
Pt returning call to office, someone called and hung up before she could answer-pls call

## 2016-11-16 DIAGNOSIS — M1712 Unilateral primary osteoarthritis, left knee: Secondary | ICD-10-CM | POA: Diagnosis not present

## 2016-12-15 NOTE — Progress Notes (Signed)
Clearance in LOV with cardiology- 111/15/17- on chart  EKG-10/31//17- EPIC  ECHO- 10/22/16- in epic and on chart

## 2016-12-15 NOTE — Patient Instructions (Signed)
Debra Campbell  12/15/2016   Your procedure is scheduled on: 12/22/2016    Report to [facility] Main  Entrance take [place]  elevators to 3rd floor to  [place] at   [place] AM.  Call this number if you have problems the morning of surgery [phone]   Remember: ONLY 1 PERSON MAY GO WITH YOU TO SHORT STAY TO GET  READY MORNING OF [place].  Do not eat food or drink liquids :After Midnight.     Take these medicines the morning of surgery with A SIP OF WATER: Synthroid, Toprol  DO NOT TAKE ANY DIABETIC MEDICATIONS DAY OF YOUR SURGERY                               You may not have any metal on your body including hair pins and              piercings  Do not wear jewelry, make-up, lotions, powders or perfumes, deodorant             Do not wear nail polish.  Do not shave  48 hours prior to surgery.               Do not bring valuables to the hospital. [place].  Contacts, dentures or bridgework may not be worn into surgery.  Leave suitcase in the car. After surgery it may be brought to your room.       Special Instructions: N/A              Please read over the following fact sheets you were given: _____________________________________________________________________             [facility] - Preparing for Surgery Before surgery, you can play an important role.  Because skin is not sterile, your skin needs to be as free of germs as possible.  You can reduce the number of germs on your skin by washing with CHG (chlorahexidine gluconate) soap before surgery.  CHG is an antiseptic cleaner which kills germs and bonds with the skin to continue killing germs even after washing. Please DO NOT use if you have an allergy to CHG or antibacterial soaps.  If your skin becomes reddened/irritated stop using the CHG and inform your nurse when you arrive at Short Stay. Do not shave (including legs and underarms) for  at least 48 hours prior to the first CHG shower.  You may shave your face/neck. Please follow these instructions carefully:  1.  Shower with CHG Soap the night before surgery and the  morning of Surgery.  2.  If you choose to wash your hair, wash your hair first as usual with your  normal  shampoo.  3.  After you shampoo, rinse your hair and body thoroughly to remove the  shampoo.                           4.  Use CHG as you would any other liquid soap.  You can apply chg directly  to the skin and wash                       Gently  with a scrungie or clean washcloth.  5.  Apply the CHG Soap to your body ONLY FROM THE NECK DOWN.   Do not use on face/ open                           Wound or open sores. Avoid contact with eyes, ears mouth and genitals (private parts).                       Wash face,  Genitals (private parts) with your normal soap.             6.  Wash thoroughly, paying special attention to the area where your surgery  will be performed.  7.  Thoroughly rinse your body with warm water from the neck down.  8.  DO NOT shower/wash with your normal soap after using and rinsing off  the CHG Soap.                9.  Pat yourself dry with a clean towel.            10.  Wear clean pajamas.            11.  Place clean sheets on your bed the night of your first shower and do not  sleep with pets. Day of Surgery : Do not apply any lotions/deodorants the morning of surgery.  Please wear clean clothes to the hospital/surgery center.  FAILURE TO FOLLOW THESE INSTRUCTIONS MAY RESULT IN THE CANCELLATION OF YOUR SURGERY PATIENT SIGNATURE_________________________________  NURSE SIGNATURE__________________________________  ________________________________________________________________________  WHAT IS A BLOOD TRANSFUSION? Blood Transfusion Information  A transfusion is the replacement of blood or some of its parts. Blood is made up of multiple cells which provide different functions.  Red  blood cells carry oxygen and are used for blood loss replacement.  White blood cells fight against infection.  Platelets control bleeding.  Plasma helps clot blood.  Other blood products are available for specialized needs, such as hemophilia or other clotting disorders. BEFORE THE TRANSFUSION  Who gives blood for transfusions?   Healthy volunteers who are fully evaluated to make sure their blood is safe. This is blood bank blood. Transfusion therapy is the safest it has ever been in the practice of medicine. Before blood is taken from a donor, a complete history is taken to make sure that person has no history of diseases nor engages in risky social behavior (examples are intravenous drug use or sexual activity with multiple partners). The donor's travel history is screened to minimize risk of transmitting infections, such as malaria. The donated blood is tested for signs of infectious diseases, such as HIV and hepatitis. The blood is then tested to be sure it is compatible with you in order to minimize the chance of a transfusion reaction. If you or a relative donates blood, this is often done in anticipation of surgery and is not appropriate for emergency situations. It takes many days to process the donated blood. RISKS AND COMPLICATIONS Although transfusion therapy is very safe and saves many lives, the main dangers of transfusion include:   Getting an infectious disease.  Developing a transfusion reaction. This is an allergic reaction to something in the blood you were given. Every precaution is taken to prevent this. The decision to have a blood transfusion has been considered carefully by your caregiver before blood is given. Blood is not given unless the benefits outweigh the  risks. AFTER THE TRANSFUSION  Right after receiving a blood transfusion, you will usually feel much better and more energetic. This is especially true if your red blood cells have gotten low (anemic). The  transfusion raises the level of the red blood cells which carry oxygen, and this usually causes an energy increase.  The nurse administering the transfusion will monitor you carefully for complications. HOME CARE INSTRUCTIONS  No special instructions are needed after a transfusion. You may find your energy is better. Speak with your caregiver about any limitations on activity for underlying diseases you may have. SEEK MEDICAL CARE IF:   Your condition is not improving after your transfusion.  You develop redness or irritation at the intravenous (IV) site. SEEK IMMEDIATE MEDICAL CARE IF:  Any of the following symptoms occur over the next 12 hours:  Shaking chills.  You have a temperature by mouth above 102 F (38.9 C), not controlled by medicine.  Chest, back, or muscle pain.  People around you feel you are not acting correctly or are confused.  Shortness of breath or difficulty breathing.  Dizziness and fainting.  You get a rash or develop hives.  You have a decrease in urine output.  Your urine turns a dark color or changes to pink, red, or brown. Any of the following symptoms occur over the next 10 days:  You have a temperature by mouth above 102 F (38.9 C), not controlled by medicine.  Shortness of breath.  Weakness after normal activity.  The white part of the eye turns yellow (jaundice).  You have a decrease in the amount of urine or are urinating less often.  Your urine turns a dark color or changes to pink, red, or brown. Document Released: 11/12/2000 Document Revised: 02/07/2012 Document Reviewed: 07/01/2008 ExitCare Patient Information 2014 [place].  _______________________________________________________________________  Incentive Spirometer  An incentive spirometer is a tool that can help keep your lungs clear and active. This tool measures how well you are filling your lungs with each breath. Taking long deep breaths may help reverse or  decrease the chance of developing breathing (pulmonary) problems (especially infection) following:  A long period of time when you are unable to move or be active. BEFORE THE PROCEDURE   If the spirometer includes an indicator to show your best effort, your nurse or respiratory therapist will set it to a desired goal.  If possible, sit up straight or lean slightly forward. Try not to slouch.  Hold the incentive spirometer in an upright position. INSTRUCTIONS FOR USE  1. Sit on the edge of your bed if possible, or sit up as far as you can in bed or on a chair. 2. Hold the incentive spirometer in an upright position. 3. Breathe out normally. 4. Place the mouthpiece in your mouth and seal your lips tightly around it. 5. Breathe in slowly and as deeply as possible, raising the piston or the ball toward the top of the column. 6. Hold your breath for 3-5 seconds or for as long as possible. Allow the piston or ball to fall to the bottom of the column. 7. Remove the mouthpiece from your mouth and breathe out normally. 8. Rest for a few seconds and repeat Steps 1 through 7 at least 10 times every 1-2 hours when you are awake. Take your time and take a few normal breaths between deep breaths. 9. The spirometer may include an indicator to show your best effort. Use the indicator as a goal to work toward  during each repetition. 10. After each set of 10 deep breaths, practice coughing to be sure your lungs are clear. If you have an incision (the cut made at the time of surgery), support your incision when coughing by placing a pillow or rolled up towels firmly against it. Once you are able to get out of bed, walk around indoors and cough well. You may stop using the incentive spirometer when instructed by your caregiver.  RISKS AND COMPLICATIONS  Take your time so you do not get dizzy or light-headed.  If you are in pain, you may need to take or ask for pain medication before doing incentive spirometry.  It is harder to take a deep breath if you are having pain. AFTER USE  Rest and breathe slowly and easily.  It can be helpful to keep track of a log of your progress. Your caregiver can provide you with a simple table to help with this. If you are using the spirometer at home, follow these instructions: [place] IF:   You are having difficultly using the spirometer.  You have trouble using the spirometer as often as instructed.  Your pain medication is not giving enough relief while using the spirometer.  You develop fever of 100.5 F (38.1 C) or higher. SEEK IMMEDIATE MEDICAL CARE IF:   You cough up bloody sputum that had not been present before.  You develop fever of 102 F (38.9 C) or greater.  You develop worsening pain at or near the incision site. MAKE SURE YOU:   Understand these instructions.  Will watch your condition.  Will get help right away if you are not doing well or get worse. Document Released: 03/28/2007 Document Revised: 02/07/2012 Document Reviewed: 05/29/2007 ExitCare Patient Information 2014 ExitCare, [place].   ________________________________________________________________________ How to Manage Your Diabetes Before and After Surgery  Why is it important to control my blood sugar before and after surgery? . Improving blood sugar levels before and after surgery helps healing and can limit problems. . A way of improving blood sugar control is eating a healthy diet by: o  Eating less sugar and carbohydrates o  Increasing activity/exercise o  Talking with your doctor about reaching your blood sugar goals . High blood sugars (greater than 180 mg/dL) can raise your risk of infections and slow your recovery, so you will need to focus on controlling your diabetes during the weeks before surgery. . Make sure that the doctor who takes care of your diabetes knows about your planned surgery including the date and location.  How do I manage my blood  sugar before surgery? . Check your blood sugar at least 4 times a day, starting 2 days before surgery, to make sure that the level is not too high or low. o Check your blood sugar the morning of your surgery when you wake up and every 2 hours until you get to the Short Stay unit. . If your blood sugar is less than 70 mg/dL, you will need to treat for low blood sugar: o Do not take insulin. o Treat a low blood sugar (less than 70 mg/dL) with  cup of clear juice (cranberry or apple), 4 glucose tablets, OR glucose gel. o Recheck blood sugar in 15 minutes after treatment (to make sure it is greater than 70 mg/dL). If your blood sugar is not greater than 70 mg/dL on recheck, call [phone] for further instructions. . Report your blood sugar to the short stay nurse when you get to  Short Stay.  . If you are admitted to the hospital after surgery: o Your blood sugar will be checked by the staff and you will probably be given insulin after surgery (instead of oral diabetes medicines) to make sure you have good blood sugar levels. o The goal for blood sugar control after surgery is 80-180 mg/dL.   WHAT DO I DO ABOUT MY DIABETES MEDICATION?  [name] Do not take oral diabetes medicines (pills) the morning of surgery.  . .   Patient Signature:  Date:   Nurse Signature:  Date:   Reviewed and Endorsed by [facility] Patient Education Committee, August 2015

## 2016-12-16 ENCOUNTER — Inpatient Hospital Stay (HOSPITAL_COMMUNITY)
Admission: RE | Admit: 2016-12-16 | Discharge: 2016-12-16 | Disposition: A | Discharge status: Home / Self Care | Payer: Medicare Other | Source: Ambulatory Visit

## 2016-12-17 ENCOUNTER — Encounter (HOSPITAL_COMMUNITY)
Admission: RE | Admit: 2016-12-17 | Discharge: 2016-12-17 | Disposition: A | Discharge status: Home / Self Care | Payer: Medicare Other | Source: Ambulatory Visit | Attending: Orthopedic Surgery | Admitting: Orthopedic Surgery

## 2016-12-17 ENCOUNTER — Ambulatory Visit (HOSPITAL_COMMUNITY)
Admission: RE | Admit: 2016-12-17 | Discharge: 2016-12-17 | Disposition: A | Discharge status: Home / Self Care | Payer: Medicare Other | Source: Ambulatory Visit | Attending: Surgical | Admitting: Surgical

## 2016-12-17 ENCOUNTER — Encounter (HOSPITAL_COMMUNITY): Payer: Self-pay

## 2016-12-17 DIAGNOSIS — I481 Persistent atrial fibrillation: Secondary | ICD-10-CM | POA: Insufficient documentation

## 2016-12-17 DIAGNOSIS — E785 Hyperlipidemia, unspecified: Secondary | ICD-10-CM | POA: Diagnosis not present

## 2016-12-17 DIAGNOSIS — Z01812 Encounter for preprocedural laboratory examination: Secondary | ICD-10-CM | POA: Diagnosis not present

## 2016-12-17 DIAGNOSIS — Z01818 Encounter for other preprocedural examination: Secondary | ICD-10-CM | POA: Diagnosis not present

## 2016-12-17 DIAGNOSIS — E119 Type 2 diabetes mellitus without complications: Secondary | ICD-10-CM | POA: Insufficient documentation

## 2016-12-17 DIAGNOSIS — Z01810 Encounter for preprocedural cardiovascular examination: Secondary | ICD-10-CM | POA: Insufficient documentation

## 2016-12-17 DIAGNOSIS — R0602 Shortness of breath: Secondary | ICD-10-CM | POA: Diagnosis not present

## 2016-12-17 DIAGNOSIS — M47814 Spondylosis without myelopathy or radiculopathy, thoracic region: Secondary | ICD-10-CM | POA: Insufficient documentation

## 2016-12-17 DIAGNOSIS — I10 Essential (primary) hypertension: Secondary | ICD-10-CM | POA: Insufficient documentation

## 2016-12-17 LAB — CBC WITH DIFFERENTIAL/PLATELET
Basophils Absolute: 0 10*3/uL (ref 0.0–0.1)
Basophils Relative: 1 %
Eosinophils Absolute: 0.3 10*3/uL (ref 0.0–0.7)
Eosinophils Relative: 4 %
HCT: 37.7 % (ref 36.0–46.0)
Hemoglobin: 12.5 g/dL (ref 12.0–15.0)
Lymphocytes Relative: 33 %
Lymphs Abs: 2.2 10*3/uL (ref 0.7–4.0)
MCH: 29.9 pg (ref 26.0–34.0)
MCHC: 33.2 g/dL (ref 30.0–36.0)
MCV: 90.2 fL (ref 78.0–100.0)
Monocytes Absolute: 0.5 10*3/uL (ref 0.1–1.0)
Monocytes Relative: 7 %
Neutro Abs: 3.6 10*3/uL (ref 1.7–7.7)
Neutrophils Relative %: 55 %
Platelets: 184 10*3/uL (ref 150–400)
RBC: 4.18 MIL/uL (ref 3.87–5.11)
RDW: 14.3 % (ref 11.5–15.5)
WBC: 6.5 10*3/uL (ref 4.0–10.5)

## 2016-12-17 LAB — URINALYSIS, ROUTINE W REFLEX MICROSCOPIC
Bacteria, UA: NONE SEEN
Bilirubin Urine: NEGATIVE
Glucose, UA: NEGATIVE mg/dL
Hgb urine dipstick: NEGATIVE
Ketones, ur: NEGATIVE mg/dL
Nitrite: NEGATIVE
Protein, ur: NEGATIVE mg/dL
Specific Gravity, Urine: 1.02 (ref 1.005–1.030)
pH: 5 (ref 5.0–8.0)

## 2016-12-17 LAB — COMPREHENSIVE METABOLIC PANEL
ALT: 17 U/L (ref 14–54)
AST: 24 U/L (ref 15–41)
Albumin: 4.5 g/dL (ref 3.5–5.0)
Alkaline Phosphatase: 47 U/L (ref 38–126)
Anion gap: 5 (ref 5–15)
BUN: 15 mg/dL (ref 6–20)
CO2: 28 mmol/L (ref 22–32)
Calcium: 9.6 mg/dL (ref 8.9–10.3)
Chloride: 108 mmol/L (ref 101–111)
Creatinine, Ser: 0.97 mg/dL (ref 0.44–1.00)
GFR calc Af Amer: >60 mL/min (ref >60)
GFR calc non Af Amer: 53 mL/min — ABNORMAL LOW (ref >60)
Glucose, Bld: 108 mg/dL — ABNORMAL HIGH (ref 65–99)
Potassium: 4.9 mmol/L (ref 3.5–5.1)
Sodium: 141 mmol/L (ref 135–145)
Total Bilirubin: 0.6 mg/dL (ref 0.3–1.2)
Total Protein: 7 g/dL (ref 6.5–8.1)

## 2016-12-17 LAB — GLUCOSE, CAPILLARY: Glucose-Capillary: 119 mg/dL — ABNORMAL HIGH (ref 65–99)

## 2016-12-17 LAB — PROTIME-INR
INR: 1.02
Prothrombin Time: 13.5 seconds (ref 11.4–15.2)

## 2016-12-17 LAB — SURGICAL PCR SCREEN
MRSA, PCR: NEGATIVE
STAPHYLOCOCCUS AUREUS: NEGATIVE

## 2016-12-17 LAB — APTT: aPTT: 29 seconds (ref 24–36)

## 2016-12-17 NOTE — H&P (Signed)
TOTAL KNEE ADMISSION H&P  Patient is being admitted for left total knee arthroplasty.  Subjective:  Chief Complaint:left knee pain.  HPI: Debra Campbell, 83 y.o. female, has a history of pain and functional disability in the left knee due to arthritis and has failed non-surgical conservative treatments for greater than 12 weeks to includeNSAID's and/or analgesics, corticosteriod injections, flexibility and strengthening excercises and activity modification.  Onset of symptoms was gradual, starting 2 years ago with gradually worsening course since that time. The patient noted prior procedures on the knee to include  arthroscopy and menisectomy on the left knee(s).  Patient currently rates pain in the left knee(s) at 8 out of 10 with activity. Patient has night pain, worsening of pain with activity and weight bearing, pain that interferes with activities of daily living, pain with passive range of motion, crepitus and joint swelling.  Patient has evidence of periarticular osteophytes and joint space narrowing by imaging studies. There is no active infection.  Patient Active Problem List   Diagnosis Date Noted  . Preoperative cardiovascular examination 10/14/2016  . New onset a-fib ([place]) 10/14/2016  . Persistent atrial fibrillation ([place]) 09/28/2016  . Morbid obesity ([place]) 08/18/2015  . Type 2 diabetes mellitus with diabetic polyneuropathy ([place]) 05/01/2015  . Endometrial cancer ([place]) 05/08/2013  . Essential hypertension 03/18/2011  . Hypothyroidism 03/18/2011  . Osteoarthritis 03/18/2011  . Osteopenia 03/18/2011  . Hyperlipidemia 03/18/2011  . Diabetic neuropathy ([place]) 03/18/2011   Past Medical History:  Diagnosis Date  . Arthritis    legs, lower back, hands  . Cancer ([place])    endometrial  . Diabetes mellitus   . Ectopic pregnancy   . Eczema   . Headache(784.0)    hx of  . History of kidney stones   . Hyperlipidemia    no meds  . Hypertension   . Hypothyroidism   . Persistent  atrial fibrillation ([place]) 09/28/2016   Relatively new Dx: Asymptomatic.  Rate controlled without medication.  . SVD (spontaneous vaginal delivery)    x 3  . Thyroid disease     Past Surgical History:  Procedure Laterality Date  . ABDOMINAL HYSTERECTOMY Bilateral 06/26/2013   Procedure: TOTAL ABDOMINAL HYSTERECTOMY WITH BILATERAL SALPINGO OOPHERECTOMY WITH PELVIC LYMPHADNECTOMY;  Surgeon: Alvino Chapel, MD;  Location: [facility];  Service: Gynecology;  Laterality: Bilateral;  . BACK SURGERY  06/26/2013  . ECTOPIC PREGNANCY SURGERY    . HERNIA REPAIR    . HYSTEROSCOPY W/D&C N/A 04/26/2013   Procedure: DILATATION AND CURETTAGE /HYSTEROSCOPY;  Surgeon: Woodroe Mode, MD;  Location: [facility];  Service: Gynecology;  Laterality: N/A;  . JOINT REPLACEMENT    . LAPAROTOMY     for ectopic pregnancy  . LAPAROTOMY N/A 06/26/2013   Procedure: EXPLORATORY LAPAROTOMY;  Surgeon: Alvino Chapel, MD;  Location: [facility];  Service: Gynecology;  Laterality: N/A;  . lt knee arthroscopy    . rt total hip replacement    . TONSILLECTOMY    . WISDOM TOOTH EXTRACTION       Current Outpatient Prescriptions:  .  acetaminophen (TYLENOL) 500 MG tablet, Take 1,000 mg by mouth 2 (two) times daily. , Disp: , Rfl:  .  aspirin EC 325 MG tablet, Take 1 tablet (325 mg total) by mouth daily., Disp: 30 tablet, Rfl: 0 .  furosemide (LASIX) 20 MG tablet, Take 1 tablet (20 mg total) by mouth daily. (Patient taking differently: Take 20 mg by mouth daily as needed for edema. ), Disp: 90 tablet, Rfl:  1 .  gabapentin (NEURONTIN) 100 MG capsule, Take 300 mg by mouth at bedtime., Disp: , Rfl:  .  levothyroxine (SYNTHROID, LEVOTHROID) 175 MCG tablet, Take 1 tablet (175 mcg total) by mouth daily before breakfast., Disp: 90 tablet, Rfl: 1 .  losartan (COZAAR) 50 MG tablet, Take 1 tablet (50 mg total) by mouth at bedtime., Disp: 90 tablet, Rfl: 1 .  metFORMIN (GLUCOPHAGE) 1000 MG tablet, Take 1 tablet (1,000 mg total) by  mouth 2 (two) times daily with a meal., Disp: 180 tablet, Rfl: 1 .  metoprolol succinate (TOPROL XL) 25 MG 24 hr tablet, Take 1 tablet (25 mg total) by mouth daily., Disp: 90 tablet, Rfl: 3 .  Multiple Vitamin (MULTIVITAMIN WITH MINERALS) TABS, Take 1 tablet by mouth daily., Disp: , Rfl:  .  triamcinolone cream (KENALOG) 0.5 %, Apply 1 application topically 3 (three) times daily. (Patient taking differently: Apply 1 application topically daily as needed (itching). ), Disp: 30 g, Rfl: 0 .  glucose blood (ACCU-CHEK AVIVA PLUS) test strip, Test 1x per day and prn. DX.E11.9, Disp: 100 each, Rfl: 12 .  Lancets (ACCU-CHEK SOFT TOUCH) lancets, Test BS 1 x daily and PRN DX. E11.9, Disp: 100 each, Rfl: 12  Allergies  Allergen Reactions  . Ace Inhibitors Cough  . Iohexol      Desc: HIVES 40 YEARS AGO   . Statins     myalgia  . Iodine Rash  . Penicillins Swelling and Rash    Has patient had a PCN reaction causing immediate rash, facial/tongue/throat swelling, SOB or lightheadedness with hypotension: Yes Has patient had a PCN reaction causing severe rash involving mucus membranes or skin necrosis: No Has patient had a PCN reaction that required hospitalization Yes Has patient had a PCN reaction occurring within the last 10 years: No If all of the above answers are "NO", then may proceed with Cephalosporin use.   . Sulfa Antibiotics Rash    Social History  Substance Use Topics  . Smoking status: Never Smoker  . Smokeless tobacco: Never Used  . Alcohol use No    Family History  Problem Relation Age of Onset  . Heart disease Father   . Thyroid disease Father   . Heart attack Father   . Heart disease Sister     open heart surgery  . Cancer Sister     breast  . Neuropathy Sister     secondary to cancer treatment  . Breast cancer Sister   . Suicidality Brother 58  . Arthritis Mother   . Cancer Daughter   . Breast cancer Daughter      Review of Systems  Constitutional: Negative.    HENT: Positive for tinnitus. Negative for congestion, ear discharge, ear pain, hearing loss, nosebleeds, sinus pain and sore throat.   Eyes: Negative.   Respiratory: Negative.  Negative for stridor.   Cardiovascular: Negative.   Gastrointestinal: Negative.   Genitourinary: Negative.   Musculoskeletal: Positive for joint pain and myalgias. Negative for back pain, falls and neck pain.  Skin: Positive for itching. Negative for rash.  Neurological: Negative.   Endo/Heme/Allergies: Negative.   Psychiatric/Behavioral: Negative.     Objective:  Physical Exam  Constitutional: She is oriented to person, place, and time. She appears well-developed. No distress.  Morbidly obese  HENT:  Head: Normocephalic and atraumatic.  Right Ear: External ear normal.  Left Ear: External ear normal.  Nose: Nose normal.  Mouth/Throat: Oropharynx is clear and moist.  Eyes: Conjunctivae and EOM are  normal.  Neck: Normal range of motion. Neck supple.  Cardiovascular: Normal rate, regular rhythm, normal heart sounds and intact distal pulses.   No murmur heard. Respiratory: Effort normal and breath sounds normal. No respiratory distress. She has no wheezes.  GI: Soft. Bowel sounds are normal. She exhibits no distension. There is no tenderness.  Musculoskeletal:       Right hip: Normal.       Left hip: Normal.       Right knee: Normal.       Left knee: She exhibits decreased range of motion and swelling. She exhibits no effusion and no erythema. Tenderness found. Medial joint line and lateral joint line tenderness noted.  Neurological: She is alert and oriented to person, place, and time. She has normal strength. No sensory deficit.  Skin: No rash noted. She is not diaphoretic. No erythema.  Psychiatric: She has a normal mood and affect. Her behavior is normal.    Vitals Weight: 210 lb Height: 64in Body Surface Area: 2 m Body Mass Index: 36.05 kg/m  Pulse: 68 (Regular)  BP: 142/72 (Sitting,  Left Arm, Standard)    Imaging Review Plain radiographs demonstrate severe degenerative joint disease of the left knee(s). The overall alignment ismild varus. The bone quality appears to be good for age and reported activity level.  Assessment/Plan:  End stage primary osteoarthritis, left knee   The patient history, physical examination, clinical judgment of the provider and imaging studies are consistent with end stage degenerative joint disease of the left knee(s) and total knee arthroplasty is deemed medically necessary. The treatment options including medical management, injection therapy arthroscopy and arthroplasty were discussed at length. The risks and benefits of total knee arthroplasty were presented and reviewed. The risks due to aseptic loosening, infection, stiffness, patella tracking problems, thromboembolic complications and other imponderables were discussed. The patient acknowledged the explanation, agreed to proceed with the plan and consent was signed. Patient is being admitted for inpatient treatment for surgery, pain control, PT, OT, prophylactic antibiotics, VTE prophylaxis, progressive ambulation and ADL's and discharge planning. The patient is planning to be discharged home with outpatient therapy.    PCP: Dr. Chevis Pretty Home with husband Therapy Plans: [facility] in [place]; start on 1/29     Ardeen Jourdain, [place]

## 2016-12-17 NOTE — Patient Instructions (Signed)
Debra Campbell  12/17/2016   Your procedure is scheduled on: 12/22/16  Report to [facility] Main  Entrance take [facility]  elevators to 3rd floor to  [place] at 5:30 AM.  Call this number if you have problems the morning of surgery [phone]   Remember: ONLY 1 PERSON MAY GO WITH YOU TO SHORT STAY TO GET  READY MORNING OF [place].  Do not eat food or drink liquids :After Midnight.     Take these medicines the morning of surgery with A SIP OF WATER: Levothyroxine, Metoprolol DO NOT TAKE ANY DIABETIC MEDICATIONS DAY OF YOUR SURGERY                               You may not have any metal on your body including hair pins and              piercings  Do not wear jewelry, make-up, lotions, powders or perfumes, deodorant             Do not wear nail polish.  Do not shave  48 hours prior to surgery.              Men may shave face and neck.   Do not bring valuables to the hospital. [place].  Contacts, dentures or bridgework may not be worn into surgery.  Leave suitcase in the car. After surgery it may be brought to your room.               Please read over the following fact sheets you were given: _____________________________________________________________________             [facility] - Preparing for Surgery Before surgery, you can play an important role.  Because skin is not sterile, your skin needs to be as free of germs as possible.  You can reduce the number of germs on your skin by washing with CHG (chlorahexidine gluconate) soap before surgery.  CHG is an antiseptic cleaner which kills germs and bonds with the skin to continue killing germs even after washing. Please DO NOT use if you have an allergy to CHG or antibacterial soaps.  If your skin becomes reddened/irritated stop using the CHG and inform your nurse when you arrive at Short Stay. Do not shave (including legs and underarms) for at  least 48 hours prior to the first CHG shower.  You may shave your face/neck. Please follow these instructions carefully:  1.  Shower with CHG Soap the night before surgery and the  morning of Surgery.  2.  If you choose to wash your hair, wash your hair first as usual with your  normal  shampoo.  3.  After you shampoo, rinse your hair and body thoroughly to remove the  shampoo.                           4.  Use CHG as you would any other liquid soap.  You can apply chg directly  to the skin and wash                       Gently with a scrungie or clean washcloth.  5.  Apply the CHG Soap to your body ONLY FROM THE NECK DOWN.   Do not use on face/ open                           Wound or open sores. Avoid contact with eyes, ears mouth and genitals (private parts).                       Wash face,  Genitals (private parts) with your normal soap.             6.  Wash thoroughly, paying special attention to the area where your surgery  will be performed.  7.  Thoroughly rinse your body with warm water from the neck down.  8.  DO NOT shower/wash with your normal soap after using and rinsing off  the CHG Soap.                9.  Pat yourself dry with a clean towel.            10.  Wear clean pajamas.            11.  Place clean sheets on your bed the night of your first shower and do not  sleep with pets. Day of Surgery : Do not apply any lotions/deodorants the morning of surgery.  Please wear clean clothes to the hospital/surgery center.  FAILURE TO FOLLOW THESE INSTRUCTIONS MAY RESULT IN THE CANCELLATION OF YOUR SURGERY PATIENT SIGNATURE_________________________________  NURSE SIGNATURE__________________________________  ________________________________________________________________________ How to Manage Your Diabetes Before and After Surgery  Why is it important to control my blood sugar before and after surgery? . Improving blood sugar levels before and after surgery helps healing and can  limit problems. . A way of improving blood sugar control is eating a healthy diet by: o  Eating less sugar and carbohydrates o  Increasing activity/exercise o  Talking with your doctor about reaching your blood sugar goals . High blood sugars (greater than 180 mg/dL) can raise your risk of infections and slow your recovery, so you will need to focus on controlling your diabetes during the weeks before surgery. . Make sure that the doctor who takes care of your diabetes knows about your planned surgery including the date and location.  How do I manage my blood sugar before surgery? . Check your blood sugar at least 4 times a day, starting 2 days before surgery, to make sure that the level is not too high or low. o Check your blood sugar the morning of your surgery when you wake up and every 2 hours until you get to the Short Stay unit. . If your blood sugar is less than 70 mg/dL, you will need to treat for low blood sugar: o Do not take insulin. o Treat a low blood sugar (less than 70 mg/dL) with  cup of clear juice (cranberry or apple), 4 glucose tablets, OR glucose gel. o Recheck blood sugar in 15 minutes after treatment (to make sure it is greater than 70 mg/dL). If your blood sugar is not greater than 70 mg/dL on recheck, call [phone] for further instructions. . Report your blood sugar to the short stay nurse when you get to Short Stay.  . If you are admitted to the hospital after surgery: o Your blood sugar will be checked by the staff and you will probably be given insulin after surgery (instead of oral diabetes  medicines) to make sure you have good blood sugar levels. o The goal for blood sugar control after surgery is 80-180 mg/dL.   WHAT DO I DO ABOUT MY DIABETES MEDICATION?  [name] Do not take oral diabetes medicines (pills) the morning of surgery.  . If your CBG is greater than 220 mg/dL, you may take  of your sliding scale  . (correction) dose of insulin.     Patient  Signature:  Date:   Nurse Signature:  Date:   Reviewed and Endorsed by [facility] Patient Education Committee, August 2015    Incentive Spirometer  An incentive spirometer is a tool that can help keep your lungs clear and active. This tool measures how well you are filling your lungs with each breath. Taking long deep breaths may help reverse or decrease the chance of developing breathing (pulmonary) problems (especially infection) following:  A long period of time when you are unable to move or be active. BEFORE THE PROCEDURE   If the spirometer includes an indicator to show your best effort, your nurse or respiratory therapist will set it to a desired goal.  If possible, sit up straight or lean slightly forward. Try not to slouch.  Hold the incentive spirometer in an upright position. INSTRUCTIONS FOR USE  1. Sit on the edge of your bed if possible, or sit up as far as you can in bed or on a chair. 2. Hold the incentive spirometer in an upright position. 3. Breathe out normally. 4. Place the mouthpiece in your mouth and seal your lips tightly around it. 5. Breathe in slowly and as deeply as possible, raising the piston or the ball toward the top of the column. 6. Hold your breath for 3-5 seconds or for as long as possible. Allow the piston or ball to fall to the bottom of the column. 7. Remove the mouthpiece from your mouth and breathe out normally. 8. Rest for a few seconds and repeat Steps 1 through 7 at least 10 times every 1-2 hours when you are awake. Take your time and take a few normal breaths between deep breaths. 9. The spirometer may include an indicator to show your best effort. Use the indicator as a goal to work toward during each repetition. 10. After each set of 10 deep breaths, practice coughing to be sure your lungs are clear. If you have an incision (the cut made at the time of surgery), support your incision when coughing by placing a pillow or rolled up towels  firmly against it. Once you are able to get out of bed, walk around indoors and cough well. You may stop using the incentive spirometer when instructed by your caregiver.  RISKS AND COMPLICATIONS  Take your time so you do not get dizzy or light-headed.  If you are in pain, you may need to take or ask for pain medication before doing incentive spirometry. It is harder to take a deep breath if you are having pain. AFTER USE  Rest and breathe slowly and easily.  It can be helpful to keep track of a log of your progress. Your caregiver can provide you with a simple table to help with this. If you are using the spirometer at home, follow these instructions: [place] IF:   You are having difficultly using the spirometer.  You have trouble using the spirometer as often as instructed.  Your pain medication is not giving enough relief while using the spirometer.  You develop fever of  100.5 F (38.1 C) or higher. SEEK IMMEDIATE MEDICAL CARE IF:   You cough up bloody sputum that had not been present before.  You develop fever of 102 F (38.9 C) or greater.  You develop worsening pain at or near the incision site. MAKE SURE YOU:   Understand these instructions.  Will watch your condition.  Will get help right away if you are not doing well or get worse. Document Released: 03/28/2007 Document Revised: 02/07/2012 Document Reviewed: 05/29/2007 ExitCare Patient Information 2014 ExitCare, [place].   ________________________________________________________________________  WHAT IS A BLOOD TRANSFUSION? Blood Transfusion Information  A transfusion is the replacement of blood or some of its parts. Blood is made up of multiple cells which provide different functions.  Red blood cells carry oxygen and are used for blood loss replacement.  White blood cells fight against infection.  Platelets control bleeding.  Plasma helps clot blood.  Other blood products are available for  specialized needs, such as hemophilia or other clotting disorders. BEFORE THE TRANSFUSION  Who gives blood for transfusions?   Healthy volunteers who are fully evaluated to make sure their blood is safe. This is blood bank blood. Transfusion therapy is the safest it has ever been in the practice of medicine. Before blood is taken from a donor, a complete history is taken to make sure that person has no history of diseases nor engages in risky social behavior (examples are intravenous drug use or sexual activity with multiple partners). The donor's travel history is screened to minimize risk of transmitting infections, such as malaria. The donated blood is tested for signs of infectious diseases, such as HIV and hepatitis. The blood is then tested to be sure it is compatible with you in order to minimize the chance of a transfusion reaction. If you or a relative donates blood, this is often done in anticipation of surgery and is not appropriate for emergency situations. It takes many days to process the donated blood. RISKS AND COMPLICATIONS Although transfusion therapy is very safe and saves many lives, the main dangers of transfusion include:   Getting an infectious disease.  Developing a transfusion reaction. This is an allergic reaction to something in the blood you were given. Every precaution is taken to prevent this. The decision to have a blood transfusion has been considered carefully by your caregiver before blood is given. Blood is not given unless the benefits outweigh the risks. AFTER THE TRANSFUSION  Right after receiving a blood transfusion, you will usually feel much better and more energetic. This is especially true if your red blood cells have gotten low (anemic). The transfusion raises the level of the red blood cells which carry oxygen, and this usually causes an energy increase.  The nurse administering the transfusion will monitor you carefully for complications. HOME CARE  INSTRUCTIONS  No special instructions are needed after a transfusion. You may find your energy is better. Speak with your caregiver about any limitations on activity for underlying diseases you may have. SEEK MEDICAL CARE IF:   Your condition is not improving after your transfusion.  You develop redness or irritation at the intravenous (IV) site. SEEK IMMEDIATE MEDICAL CARE IF:  Any of the following symptoms occur over the next 12 hours:  Shaking chills.  You have a temperature by mouth above 102 F (38.9 C), not controlled by medicine.  Chest, back, or muscle pain.  People around you feel you are not acting correctly or are confused.  Shortness of breath or difficulty  breathing.  Dizziness and fainting.  You get a rash or develop hives.  You have a decrease in urine output.  Your urine turns a dark color or changes to pink, red, or brown. Any of the following symptoms occur over the next 10 days:  You have a temperature by mouth above 102 F (38.9 C), not controlled by medicine.  Shortness of breath.  Weakness after normal activity.  The white part of the eye turns yellow (jaundice).  You have a decrease in the amount of urine or are urinating less often.  Your urine turns a dark color or changes to pink, red, or brown. Document Released: 11/12/2000 Document Revised: 02/07/2012 Document Reviewed: 07/01/2008 [facility] Patient Information 2014 [place], [place].  _______________________________________________________________________

## 2016-12-17 NOTE — Pre-Procedure Instructions (Addendum)
Medical Clearance on chart 10-13-16 Echo on chart 10-22-16 EKG 09-28-16 epic Cardiac LOV and Clearance on chart 10-13-16  EKG from 09/28/16 new onset A fib.  Cardiac Clearance on chart 10/13/16.  Pt's EKG at today's pre-op visit prior to MD final reading is A fib.

## 2016-12-18 LAB — HEMOGLOBIN A1C
Hgb A1c MFr Bld: 6.2 % — ABNORMAL HIGH (ref 4.8–5.6)
Mean Plasma Glucose: 131 mg/dL

## 2016-12-21 ENCOUNTER — Ambulatory Visit: Payer: Medicare Other | Admitting: Cardiology

## 2016-12-21 MED ORDER — VANCOMYCIN HCL 10 G IV SOLR
1500.0000 mg | INTRAVENOUS | Status: AC
  Administered 2016-12-22: 1500 mg via INTRAVENOUS
  Filled 2016-12-21: qty 1500

## 2016-12-21 MED ORDER — TRANEXAMIC ACID 1000 MG/10ML IV SOLN
1000.0000 mg | INTRAVENOUS | Status: AC
  Administered 2016-12-22: 1000 mg via INTRAVENOUS
  Filled 2016-12-21 (×2): qty 10

## 2016-12-21 MED ORDER — BUPIVACAINE LIPOSOME 1.3 % IJ SUSP
20.0000 mL | Freq: Once | INTRAMUSCULAR | Status: DC
  Filled 2016-12-21 (×2): qty 20

## 2016-12-22 ENCOUNTER — Encounter (HOSPITAL_COMMUNITY)
Admission: RE | Disposition: A | Discharge status: Skilled Nursing Facility | Payer: Self-pay | Source: Ambulatory Visit | Attending: Orthopedic Surgery

## 2016-12-22 ENCOUNTER — Encounter (HOSPITAL_COMMUNITY): Payer: Self-pay | Admitting: *Deleted

## 2016-12-22 ENCOUNTER — Inpatient Hospital Stay (HOSPITAL_COMMUNITY)
Admission: RE | Admit: 2016-12-22 | Discharge: 2016-12-24 | DRG: 470 | Disposition: A | Discharge status: Skilled Nursing Facility | Payer: Medicare Other | Source: Ambulatory Visit | Attending: Orthopedic Surgery | Admitting: Orthopedic Surgery

## 2016-12-22 ENCOUNTER — Encounter: Payer: Self-pay | Admitting: *Deleted

## 2016-12-22 ENCOUNTER — Inpatient Hospital Stay (HOSPITAL_COMMUNITY): Payer: Medicare Other | Admitting: Anesthesiology

## 2016-12-22 DIAGNOSIS — Z882 Allergy status to sulfonamides status: Secondary | ICD-10-CM | POA: Diagnosis not present

## 2016-12-22 DIAGNOSIS — Z96641 Presence of right artificial hip joint: Secondary | ICD-10-CM | POA: Diagnosis present

## 2016-12-22 DIAGNOSIS — M8580 Other specified disorders of bone density and structure, unspecified site: Secondary | ICD-10-CM | POA: Diagnosis present

## 2016-12-22 DIAGNOSIS — E1142 Type 2 diabetes mellitus with diabetic polyneuropathy: Secondary | ICD-10-CM | POA: Diagnosis not present

## 2016-12-22 DIAGNOSIS — M479 Spondylosis, unspecified: Secondary | ICD-10-CM | POA: Diagnosis present

## 2016-12-22 DIAGNOSIS — Z91041 Radiographic dye allergy status: Secondary | ICD-10-CM

## 2016-12-22 DIAGNOSIS — I4891 Unspecified atrial fibrillation: Secondary | ICD-10-CM | POA: Diagnosis not present

## 2016-12-22 DIAGNOSIS — M6281 Muscle weakness (generalized): Secondary | ICD-10-CM | POA: Diagnosis not present

## 2016-12-22 DIAGNOSIS — Z7982 Long term (current) use of aspirin: Secondary | ICD-10-CM | POA: Diagnosis not present

## 2016-12-22 DIAGNOSIS — M1712 Unilateral primary osteoarthritis, left knee: Principal | ICD-10-CM | POA: Diagnosis present

## 2016-12-22 DIAGNOSIS — E039 Hypothyroidism, unspecified: Secondary | ICD-10-CM | POA: Diagnosis present

## 2016-12-22 DIAGNOSIS — Z880 Allergy status to penicillin: Secondary | ICD-10-CM | POA: Diagnosis not present

## 2016-12-22 DIAGNOSIS — R279 Unspecified lack of coordination: Secondary | ICD-10-CM | POA: Diagnosis not present

## 2016-12-22 DIAGNOSIS — E871 Hypo-osmolality and hyponatremia: Secondary | ICD-10-CM | POA: Diagnosis present

## 2016-12-22 DIAGNOSIS — Z8349 Family history of other endocrine, nutritional and metabolic diseases: Secondary | ICD-10-CM

## 2016-12-22 DIAGNOSIS — Z96659 Presence of unspecified artificial knee joint: Secondary | ICD-10-CM

## 2016-12-22 DIAGNOSIS — Z6836 Body mass index (BMI) 36.0-36.9, adult: Secondary | ICD-10-CM | POA: Diagnosis not present

## 2016-12-22 DIAGNOSIS — L309 Dermatitis, unspecified: Secondary | ICD-10-CM | POA: Diagnosis not present

## 2016-12-22 DIAGNOSIS — Z888 Allergy status to other drugs, medicaments and biological substances status: Secondary | ICD-10-CM | POA: Diagnosis not present

## 2016-12-22 DIAGNOSIS — Z79899 Other long term (current) drug therapy: Secondary | ICD-10-CM

## 2016-12-22 DIAGNOSIS — I481 Persistent atrial fibrillation: Secondary | ICD-10-CM | POA: Diagnosis not present

## 2016-12-22 DIAGNOSIS — R269 Unspecified abnormalities of gait and mobility: Secondary | ICD-10-CM | POA: Diagnosis not present

## 2016-12-22 DIAGNOSIS — E785 Hyperlipidemia, unspecified: Secondary | ICD-10-CM | POA: Diagnosis not present

## 2016-12-22 DIAGNOSIS — M179 Osteoarthritis of knee, unspecified: Secondary | ICD-10-CM | POA: Diagnosis not present

## 2016-12-22 DIAGNOSIS — M1990 Unspecified osteoarthritis, unspecified site: Secondary | ICD-10-CM | POA: Diagnosis not present

## 2016-12-22 DIAGNOSIS — Z96652 Presence of left artificial knee joint: Secondary | ICD-10-CM | POA: Diagnosis not present

## 2016-12-22 DIAGNOSIS — Z8249 Family history of ischemic heart disease and other diseases of the circulatory system: Secondary | ICD-10-CM

## 2016-12-22 DIAGNOSIS — M24562 Contracture, left knee: Secondary | ICD-10-CM | POA: Diagnosis not present

## 2016-12-22 DIAGNOSIS — E1140 Type 2 diabetes mellitus with diabetic neuropathy, unspecified: Secondary | ICD-10-CM | POA: Diagnosis not present

## 2016-12-22 DIAGNOSIS — M19049 Primary osteoarthritis, unspecified hand: Secondary | ICD-10-CM | POA: Diagnosis present

## 2016-12-22 DIAGNOSIS — G8918 Other acute postprocedural pain: Secondary | ICD-10-CM | POA: Diagnosis not present

## 2016-12-22 DIAGNOSIS — I10 Essential (primary) hypertension: Secondary | ICD-10-CM | POA: Diagnosis present

## 2016-12-22 DIAGNOSIS — Z7984 Long term (current) use of oral hypoglycemic drugs: Secondary | ICD-10-CM

## 2016-12-22 HISTORY — PX: TOTAL KNEE ARTHROPLASTY: SHX125

## 2016-12-22 LAB — GLUCOSE, CAPILLARY
GLUCOSE-CAPILLARY: 148 mg/dL — AB (ref 65–99)
GLUCOSE-CAPILLARY: 162 mg/dL — AB (ref 65–99)
GLUCOSE-CAPILLARY: 217 mg/dL — AB (ref 65–99)
GLUCOSE-CAPILLARY: 190 mg/dL — AB (ref 65–99)
Glucose-Capillary: 228 mg/dL — ABNORMAL HIGH (ref 65–99)

## 2016-12-22 LAB — TYPE AND SCREEN
ABO/RH(D): A POS
Antibody Screen: NEGATIVE

## 2016-12-22 SURGERY — ARTHROPLASTY, KNEE, TOTAL
Anesthesia: General | Site: Knee | Laterality: Left

## 2016-12-22 MED ORDER — INSULIN ASPART 100 UNIT/ML ~~LOC~~ SOLN
0.0000 [IU] | Freq: Three times a day (TID) | SUBCUTANEOUS | Status: DC
  Administered 2016-12-22: 3 [IU] via SUBCUTANEOUS
  Administered 2016-12-23 (×3): 2 [IU] via SUBCUTANEOUS
  Administered 2016-12-24: 1 [IU] via SUBCUTANEOUS

## 2016-12-22 MED ORDER — FUROSEMIDE 20 MG PO TABS
20.0000 mg | ORAL_TABLET | Freq: Every day | ORAL | Status: DC | PRN
Start: 2016-12-22 — End: 2016-12-24

## 2016-12-22 MED ORDER — BUPIVACAINE HCL (PF) 0.25 % IJ SOLN
INTRAMUSCULAR | Status: DC | PRN
  Administered 2016-12-22: 20 mL

## 2016-12-22 MED ORDER — BUPIVACAINE LIPOSOME 1.3 % IJ SUSP
INTRAMUSCULAR | Status: AC
  Filled 2016-12-22: qty 20

## 2016-12-22 MED ORDER — LOSARTAN POTASSIUM 50 MG PO TABS
50.0000 mg | ORAL_TABLET | Freq: Every day | ORAL | Status: DC
  Administered 2016-12-22 – 2016-12-23 (×2): 50 mg via ORAL
  Filled 2016-12-22 (×2): qty 1

## 2016-12-22 MED ORDER — POLYETHYLENE GLYCOL 3350 17 G PO PACK: 17.0000 g | PACK | Freq: Every day | ORAL | Status: DC | PRN

## 2016-12-22 MED ORDER — CHLORHEXIDINE GLUCONATE 4 % EX LIQD: 60.0000 mL | Freq: Once | CUTANEOUS | Status: DC

## 2016-12-22 MED ORDER — SODIUM CHLORIDE 0.9 % IR SOLN
Status: DC | PRN
  Administered 2016-12-22: 1000 mL

## 2016-12-22 MED ORDER — FENTANYL CITRATE (PF) 100 MCG/2ML IJ SOLN
INTRAMUSCULAR | Status: AC
  Filled 2016-12-22: qty 2

## 2016-12-22 MED ORDER — ONDANSETRON HCL 4 MG/2ML IJ SOLN
INTRAMUSCULAR | Status: DC | PRN
  Administered 2016-12-22: 4 mg via INTRAVENOUS

## 2016-12-22 MED ORDER — MENTHOL 3 MG MT LOZG
1.0000 | LOZENGE | OROMUCOSAL | Status: DC | PRN
  Filled 2016-12-22: qty 9

## 2016-12-22 MED ORDER — SODIUM CHLORIDE 0.9 % IR SOLN
Status: AC
  Filled 2016-12-22: qty 500000

## 2016-12-22 MED ORDER — FENTANYL CITRATE (PF) 100 MCG/2ML IJ SOLN: 25.0000 ug | INTRAMUSCULAR | Status: DC | PRN

## 2016-12-22 MED ORDER — PROPOFOL 10 MG/ML IV BOLUS
INTRAVENOUS | Status: DC | PRN
  Administered 2016-12-22: 130 mg via INTRAVENOUS

## 2016-12-22 MED ORDER — FERROUS SULFATE 325 (65 FE) MG PO TABS
325.0000 mg | ORAL_TABLET | Freq: Three times a day (TID) | ORAL | Status: DC
  Administered 2016-12-23 – 2016-12-24 (×4): 325 mg via ORAL
  Filled 2016-12-22 (×4): qty 1

## 2016-12-22 MED ORDER — HYDROCODONE-ACETAMINOPHEN 5-325 MG PO TABS
1.0000 | ORAL_TABLET | ORAL | Status: DC | PRN
  Administered 2016-12-23 – 2016-12-24 (×4): 2 via ORAL
  Administered 2016-12-24: 1 via ORAL
  Filled 2016-12-22 (×5): qty 2

## 2016-12-22 MED ORDER — BUPIVACAINE HCL (PF) 0.25 % IJ SOLN
INTRAMUSCULAR | Status: AC
  Filled 2016-12-22: qty 30

## 2016-12-22 MED ORDER — ROPIVACAINE HCL 5 MG/ML IJ SOLN
INTRAMUSCULAR | Status: AC
  Filled 2016-12-22: qty 30

## 2016-12-22 MED ORDER — FLEET ENEMA 7-19 GM/118ML RE ENEM: 1.0000 | ENEMA | Freq: Once | RECTAL | Status: DC | PRN

## 2016-12-22 MED ORDER — SODIUM CHLORIDE 0.9 % IR SOLN
Status: DC | PRN
  Administered 2016-12-22: 500 mL

## 2016-12-22 MED ORDER — RIVAROXABAN 10 MG PO TABS
10.0000 mg | ORAL_TABLET | Freq: Every day | ORAL | Status: DC
  Administered 2016-12-23 – 2016-12-24 (×2): 10 mg via ORAL
  Filled 2016-12-22 (×2): qty 1

## 2016-12-22 MED ORDER — ROCURONIUM BROMIDE 10 MG/ML (PF) SYRINGE
PREFILLED_SYRINGE | INTRAVENOUS | Status: DC | PRN
  Administered 2016-12-22: 50 mg via INTRAVENOUS
  Administered 2016-12-22: 20 mg via INTRAVENOUS

## 2016-12-22 MED ORDER — ONDANSETRON HCL 4 MG PO TABS: 4.0000 mg | ORAL_TABLET | Freq: Four times a day (QID) | ORAL | Status: DC | PRN

## 2016-12-22 MED ORDER — HYDROMORPHONE HCL 1 MG/ML IJ SOLN
INTRAMUSCULAR | Status: DC | PRN
  Administered 2016-12-22 (×3): .4 mg via INTRAVENOUS

## 2016-12-22 MED ORDER — HYDROMORPHONE HCL 2 MG/ML IJ SOLN
0.5000 mg | INTRAMUSCULAR | Status: DC | PRN
  Administered 2016-12-22 – 2016-12-23 (×4): 0.5 mg via INTRAVENOUS
  Filled 2016-12-22 (×5): qty 1

## 2016-12-22 MED ORDER — GABAPENTIN 300 MG PO CAPS
300.0000 mg | ORAL_CAPSULE | Freq: Every day | ORAL | Status: DC
  Administered 2016-12-22 – 2016-12-23 (×2): 300 mg via ORAL
  Filled 2016-12-22 (×2): qty 1

## 2016-12-22 MED ORDER — METHOCARBAMOL 1000 MG/10ML IJ SOLN
500.0000 mg | Freq: Four times a day (QID) | INTRAVENOUS | Status: DC | PRN
  Administered 2016-12-22: 500 mg via INTRAVENOUS
  Filled 2016-12-22: qty 550
  Filled 2016-12-22 (×3): qty 5

## 2016-12-22 MED ORDER — BISACODYL 5 MG PO TBEC: 5.0000 mg | DELAYED_RELEASE_TABLET | Freq: Every day | ORAL | Status: DC | PRN

## 2016-12-22 MED ORDER — LACTATED RINGERS IV SOLN
INTRAVENOUS | Status: DC
  Administered 2016-12-22 – 2016-12-23 (×2): via INTRAVENOUS

## 2016-12-22 MED ORDER — ACETAMINOPHEN 325 MG PO TABS: 650.0000 mg | ORAL_TABLET | Freq: Four times a day (QID) | ORAL | Status: DC | PRN

## 2016-12-22 MED ORDER — METOPROLOL TARTRATE 5 MG/5ML IV SOLN
INTRAVENOUS | Status: AC
  Filled 2016-12-22: qty 5

## 2016-12-22 MED ORDER — FENTANYL CITRATE (PF) 100 MCG/2ML IJ SOLN
INTRAMUSCULAR | Status: DC | PRN
  Administered 2016-12-22 (×6): 50 ug via INTRAVENOUS

## 2016-12-22 MED ORDER — METHOCARBAMOL 500 MG PO TABS
500.0000 mg | ORAL_TABLET | Freq: Four times a day (QID) | ORAL | Status: DC | PRN
  Administered 2016-12-23 (×2): 500 mg via ORAL
  Filled 2016-12-22 (×2): qty 1

## 2016-12-22 MED ORDER — LEVOTHYROXINE SODIUM 50 MCG PO TABS
175.0000 ug | ORAL_TABLET | Freq: Every day | ORAL | Status: DC
  Administered 2016-12-23 – 2016-12-24 (×2): 175 ug via ORAL
  Filled 2016-12-22 (×2): qty 1

## 2016-12-22 MED ORDER — OXYCODONE-ACETAMINOPHEN 5-325 MG PO TABS: 2.0000 | ORAL_TABLET | ORAL | Status: DC | PRN

## 2016-12-22 MED ORDER — SODIUM CHLORIDE 0.9 % IJ SOLN
INTRAMUSCULAR | Status: AC
  Filled 2016-12-22: qty 50

## 2016-12-22 MED ORDER — SODIUM CHLORIDE 0.9 % IJ SOLN
INTRAMUSCULAR | Status: DC | PRN
Start: 2016-12-22 — End: 2016-12-22
  Administered 2016-12-22: 20 mL via INTRAVENOUS

## 2016-12-22 MED ORDER — METOPROLOL SUCCINATE ER 25 MG PO TB24
25.0000 mg | ORAL_TABLET | Freq: Every day | ORAL | Status: DC
  Administered 2016-12-22 – 2016-12-24 (×3): 25 mg via ORAL
  Filled 2016-12-22 (×3): qty 1

## 2016-12-22 MED ORDER — STERILE WATER FOR IRRIGATION IR SOLN
Status: DC | PRN
  Administered 2016-12-22: 2000 mL

## 2016-12-22 MED ORDER — PHENYLEPHRINE 40 MCG/ML (10ML) SYRINGE FOR IV PUSH (FOR BLOOD PRESSURE SUPPORT)
PREFILLED_SYRINGE | INTRAVENOUS | Status: DC | PRN
  Administered 2016-12-22 (×2): 40 ug via INTRAVENOUS

## 2016-12-22 MED ORDER — SUGAMMADEX SODIUM 200 MG/2ML IV SOLN
INTRAVENOUS | Status: DC | PRN
  Administered 2016-12-22: 200 mg via INTRAVENOUS

## 2016-12-22 MED ORDER — ACETAMINOPHEN 650 MG RE SUPP: 650.0000 mg | Freq: Four times a day (QID) | RECTAL | Status: DC | PRN

## 2016-12-22 MED ORDER — MIDAZOLAM HCL 2 MG/2ML IJ SOLN
INTRAMUSCULAR | Status: AC
  Filled 2016-12-22: qty 2

## 2016-12-22 MED ORDER — MIDAZOLAM HCL 2 MG/2ML IJ SOLN
INTRAMUSCULAR | Status: DC | PRN
  Administered 2016-12-22: 1 mg via INTRAVENOUS

## 2016-12-22 MED ORDER — SUGAMMADEX SODIUM 200 MG/2ML IV SOLN
INTRAVENOUS | Status: AC
  Filled 2016-12-22: qty 2

## 2016-12-22 MED ORDER — METOPROLOL TARTRATE 5 MG/5ML IV SOLN
INTRAVENOUS | Status: DC | PRN
  Administered 2016-12-22 (×3): 1 mg via INTRAVENOUS

## 2016-12-22 MED ORDER — ROCURONIUM BROMIDE 50 MG/5ML IV SOSY
PREFILLED_SYRINGE | INTRAVENOUS | Status: AC
  Filled 2016-12-22: qty 5

## 2016-12-22 MED ORDER — BUPIVACAINE LIPOSOME 1.3 % IJ SUSP
INTRAMUSCULAR | Status: DC | PRN
  Administered 2016-12-22: 20 mL

## 2016-12-22 MED ORDER — SUCCINYLCHOLINE CHLORIDE 200 MG/10ML IV SOSY
PREFILLED_SYRINGE | INTRAVENOUS | Status: AC
  Filled 2016-12-22: qty 10

## 2016-12-22 MED ORDER — PHENOL 1.4 % MT LIQD
1.0000 | OROMUCOSAL | Status: DC | PRN
  Filled 2016-12-22: qty 177

## 2016-12-22 MED ORDER — PROPOFOL 10 MG/ML IV BOLUS
INTRAVENOUS | Status: AC
  Filled 2016-12-22: qty 20

## 2016-12-22 MED ORDER — ONDANSETRON HCL 4 MG/2ML IJ SOLN
4.0000 mg | Freq: Four times a day (QID) | INTRAMUSCULAR | Status: DC | PRN
  Administered 2016-12-22: 4 mg via INTRAVENOUS
  Filled 2016-12-22: qty 2

## 2016-12-22 MED ORDER — PHENYLEPHRINE 40 MCG/ML (10ML) SYRINGE FOR IV PUSH (FOR BLOOD PRESSURE SUPPORT)
PREFILLED_SYRINGE | INTRAVENOUS | Status: AC
  Filled 2016-12-22: qty 10

## 2016-12-22 MED ORDER — ONDANSETRON HCL 4 MG/2ML IJ SOLN
INTRAMUSCULAR | Status: AC
  Filled 2016-12-22: qty 2

## 2016-12-22 MED ORDER — SODIUM CHLORIDE 0.9 % IJ SOLN
INTRAMUSCULAR | Status: AC
  Filled 2016-12-22: qty 10

## 2016-12-22 MED ORDER — ONDANSETRON HCL 4 MG/2ML IJ SOLN: 4.0000 mg | Freq: Once | INTRAMUSCULAR | Status: DC | PRN

## 2016-12-22 MED ORDER — LACTATED RINGERS IV SOLN
INTRAVENOUS | Status: DC
  Administered 2016-12-22 (×2): via INTRAVENOUS

## 2016-12-22 MED ORDER — HYDROMORPHONE HCL 2 MG/ML IJ SOLN
INTRAMUSCULAR | Status: AC
  Filled 2016-12-22: qty 1

## 2016-12-22 MED ORDER — VANCOMYCIN HCL IN DEXTROSE 1-5 GM/200ML-% IV SOLN
1000.0000 mg | Freq: Two times a day (BID) | INTRAVENOUS | Status: AC
  Administered 2016-12-22: 1000 mg via INTRAVENOUS
  Filled 2016-12-22: qty 200

## 2016-12-22 MED ORDER — LIDOCAINE 2% (20 MG/ML) 5 ML SYRINGE
INTRAMUSCULAR | Status: AC
  Filled 2016-12-22: qty 5

## 2016-12-22 MED ORDER — ALUM & MAG HYDROXIDE-SIMETH 200-200-20 MG/5ML PO SUSP: 30.0000 mL | ORAL | Status: DC | PRN

## 2016-12-22 SURGICAL SUPPLY — 75 items
AGENT HMST SPONGE THK3/8 (HEMOSTASIS) ×1
ANCH SUT 2 5.5 BABSR ASCP (Orthopedic Implant) ×1 IMPLANT
ANCHOR PEEK ZIP 5.5 NDL NO2 (Orthopedic Implant) ×2 IMPLANT
BAG DECANTER FOR FLEXI CONT (MISCELLANEOUS) IMPLANT
BAG SPEC THK2 15X12 ZIP CLS (MISCELLANEOUS)
BAG ZIPLOCK 12X15 (MISCELLANEOUS) IMPLANT
BANDAGE ACE 4X5 VEL STRL LF (GAUZE/BANDAGES/DRESSINGS) ×3 IMPLANT
BANDAGE ACE 6X5 VEL STRL LF (GAUZE/BANDAGES/DRESSINGS) ×3 IMPLANT
BLADE SAG 18X100X1.27 (BLADE) ×3 IMPLANT
BLADE SAW SGTL 11.0X1.19X90.0M (BLADE) ×3 IMPLANT
BNDG COHESIVE 4X5 TAN NS LF (GAUZE/BANDAGES/DRESSINGS) ×9 IMPLANT
BONE CEMENT GENTAMICIN (Cement) ×6 IMPLANT
CAP KNEE TOTAL 3 SIGMA ×2 IMPLANT
CEMENT BONE GENTAMICIN 40 (Cement) ×2 IMPLANT
CLOSURE WOUND 1/2 X4 (GAUZE/BANDAGES/DRESSINGS) ×1
CLOTH BEACON ORANGE TIMEOUT ST (SAFETY) ×3 IMPLANT
CUFF TOURN SGL QUICK 34 (TOURNIQUET CUFF) ×3
CUFF TRNQT CYL 34X4X40X1 (TOURNIQUET CUFF) ×1 IMPLANT
DECANTER SPIKE VIAL GLASS SM (MISCELLANEOUS) ×3 IMPLANT
DRAPE INCISE IOBAN 66X45 STRL (DRAPES) IMPLANT
DRAPE U-SHAPE 47X51 STRL (DRAPES) ×3 IMPLANT
DRSG ADAPTIC 3X8 NADH LF (GAUZE/BANDAGES/DRESSINGS) ×3 IMPLANT
DRSG PAD ABDOMINAL 8X10 ST (GAUZE/BANDAGES/DRESSINGS) ×6 IMPLANT
DURAPREP 26ML APPLICATOR (WOUND CARE) ×3 IMPLANT
ELECT BLADE TIP CTD 4 INCH (ELECTRODE) ×2 IMPLANT
ELECT REM PT RETURN 9FT ADLT (ELECTROSURGICAL) ×3
ELECTRODE REM PT RTRN 9FT ADLT (ELECTROSURGICAL) ×1 IMPLANT
EVACUATOR 1/8 PVC DRAIN (DRAIN) ×3 IMPLANT
FACESHIELD WRAPAROUND (MASK) ×3 IMPLANT
FACESHIELD WRAPAROUND OR TEAM (MASK) ×1 IMPLANT
GAUZE SPONGE 4X4 12PLY STRL (GAUZE/BANDAGES/DRESSINGS) ×3 IMPLANT
GLOVE BIO SURGEON STRL SZ 6.5 (GLOVE) ×1 IMPLANT
GLOVE BIO SURGEON STRL SZ8 (GLOVE) ×2 IMPLANT
GLOVE BIO SURGEONS STRL SZ 6.5 (GLOVE) ×1
GLOVE BIOGEL PI IND STRL 6.5 (GLOVE) ×1 IMPLANT
GLOVE BIOGEL PI IND STRL 7.5 (GLOVE) IMPLANT
GLOVE BIOGEL PI IND STRL 8.5 (GLOVE) ×1 IMPLANT
GLOVE BIOGEL PI INDICATOR 6.5 (GLOVE) ×2
GLOVE BIOGEL PI INDICATOR 7.5 (GLOVE) ×4
GLOVE BIOGEL PI INDICATOR 8.5 (GLOVE) ×2
GLOVE ECLIPSE 8.0 STRL XLNG CF (GLOVE) ×6 IMPLANT
GLOVE SURG SS PI 6.5 STRL IVOR (GLOVE) ×3 IMPLANT
GLOVE SURG SS PI 7.5 STRL IVOR (GLOVE) ×2 IMPLANT
GOWN STRL REUS W/TWL LRG LVL3 (GOWN DISPOSABLE) ×3 IMPLANT
GOWN STRL REUS W/TWL XL LVL3 (GOWN DISPOSABLE) ×3 IMPLANT
HANDPIECE INTERPULSE COAX TIP (DISPOSABLE) ×3
HEMOSTAT SPONGE AVITENE ULTRA (HEMOSTASIS) ×3 IMPLANT
IMMOBILIZER KNEE 20 (SOFTGOODS) ×3
IMMOBILIZER KNEE 20 THIGH 36 (SOFTGOODS) ×1 IMPLANT
MANIFOLD NEPTUNE II (INSTRUMENTS) ×3 IMPLANT
NDL HYPO 21X1.5 SAFETY (NEEDLE) ×1 IMPLANT
NEEDLE HYPO 21X1.5 SAFETY (NEEDLE) ×3 IMPLANT
NEEDLE HYPO 22GX1.5 SAFETY (NEEDLE) ×3 IMPLANT
NS IRRIG 1000ML POUR BTL (IV SOLUTION) IMPLANT
PACK TOTAL KNEE CUSTOM (KITS) ×3 IMPLANT
PADDING CAST COTTON 6X4 STRL (CAST SUPPLIES) ×3 IMPLANT
PENCIL SMOKE EVAC W/HOLSTER (ELECTROSURGICAL) ×3 IMPLANT
POSITIONER SURGICAL ARM (MISCELLANEOUS) ×3 IMPLANT
SET HNDPC FAN SPRY TIP SCT (DISPOSABLE) ×1 IMPLANT
SET PAD KNEE POSITIONER (MISCELLANEOUS) ×3 IMPLANT
SPONGE LAP 18X18 X RAY DECT (DISPOSABLE) IMPLANT
STRIP CLOSURE SKIN 1/2X4 (GAUZE/BANDAGES/DRESSINGS) ×3 IMPLANT
SUT BONE WAX W31G (SUTURE) IMPLANT
SUT MNCRL AB 4-0 PS2 18 (SUTURE) ×3 IMPLANT
SUT VIC AB 1 CT1 27 (SUTURE) ×9
SUT VIC AB 1 CT1 27XBRD ANTBC (SUTURE) ×2 IMPLANT
SUT VIC AB 2-0 CT1 27 (SUTURE) ×12
SUT VIC AB 2-0 CT1 TAPERPNT 27 (SUTURE) ×3 IMPLANT
SUT VLOC 180 0 24IN GS25 (SUTURE) ×3 IMPLANT
SYR 20CC LL (SYRINGE) ×6 IMPLANT
TOWER CARTRIDGE SMART MIX (DISPOSABLE) ×3 IMPLANT
TRAY FOLEY W/METER SILVER 16FR (SET/KITS/TRAYS/PACK) ×3 IMPLANT
WATER STERILE IRR 1500ML POUR (IV SOLUTION) ×3 IMPLANT
WRAP KNEE MAXI GEL POST OP (GAUZE/BANDAGES/DRESSINGS) ×3 IMPLANT
YANKAUER SUCT BULB TIP 10FT TU (MISCELLANEOUS) ×3 IMPLANT

## 2016-12-22 NOTE — Anesthesia Preprocedure Evaluation (Signed)
Anesthesia Evaluation  Patient identified by MRN, date of birth, ID band Patient awake    Reviewed: Allergy & Precautions, NPO status , Patient's Chart, lab work & pertinent test results  Airway Mallampati: II  TM Distance: >3 FB Neck ROM: Full    Dental  (+) Teeth Intact, Dental Advisory Given   Pulmonary    breath sounds clear to auscultation       Cardiovascular hypertension,  Rhythm:Regular Rate:Normal     Neuro/Psych    GI/Hepatic   Endo/Other  diabetes  Renal/GU      Musculoskeletal   Abdominal (+) + obese,   Peds  Hematology   Anesthesia Other Findings   Reproductive/Obstetrics                             Anesthesia Physical Anesthesia Plan  ASA: III  Anesthesia Plan: General   Post-op Pain Management:    Induction: Intravenous  Airway Management Planned: Oral ETT  Additional Equipment:   Intra-op Plan:   Post-operative Plan:   Informed Consent: I have reviewed the patients History and Physical, chart, labs and discussed the procedure including the risks, benefits and alternatives for the proposed anesthesia with the patient or authorized representative who has indicated his/her understanding and acceptance.   Dental advisory given  Plan Discussed with: CRNA and Anesthesiologist  Anesthesia Plan Comments: (Atrial Fibrillation persistent, recently diagosed Hypertension Type 2 DM  Plan GA with adductor canal block  Roberts Gaudy)        Anesthesia Quick Evaluation

## 2016-12-22 NOTE — Anesthesia Procedure Notes (Signed)
Anesthesia Regional Block:  Adductor canal block  Pre-Anesthetic Checklist: ,, timeout performed, Correct Patient, Correct Site, Correct Laterality, Correct Procedure, Correct Position, site marked, Risks and benefits discussed, Surgical consent,  At surgeon's request and post-op pain management  Laterality: Left  Prep: chloraprep       Needles:   Needle Type: Echogenic Stimulator Needle      Needle Gauge: 21 and 21 G    Additional Needles:  Procedures: ultrasound guided (picture in chart) Adductor canal block Narrative:  Start time: 12/22/2016 7:25 AM End time: 12/22/2016 7:30 AM Injection made incrementally with aspirations every 5 mL.  Performed by: Personally  Anesthesiologist: Gerldine Suleiman  Additional Notes:  Cc 0.5% Bupivacaine with 1:200 Epi injected easily

## 2016-12-22 NOTE — Transfer of Care (Signed)
Immediate Anesthesia Transfer of Care Note  Patient: Debra Campbell  Procedure(s) Performed: Procedure(s): LEFT TOTAL KNEE ARTHROPLASTY (Left)  Patient Location: PACU  Anesthesia Type:General  Level of Consciousness: awake  Airway & Oxygen Therapy: Patient Spontanous Breathing and Patient connected to face mask oxygen  Post-op Assessment: Report given to RN and Post -op Vital signs reviewed and stable  Post vital signs: Reviewed and stable  Last Vitals:  Vitals:   12/22/16 0544  BP: (!) 144/72  Pulse: 92  Resp: 16  Temp: 36.7 C    Last Pain:  Vitals:   12/22/16 0549  TempSrc:   PainSc: 4       Patients Stated Pain Goal: 3 ([id number])  Complications: No apparent anesthesia complications

## 2016-12-22 NOTE — Interval H&P Note (Signed)
History and Physical Interval Note:  12/22/2016 7:31 AM  Debra Campbell  has presented today for surgery, with the diagnosis of Left knee primary osteoarthritis  The various methods of treatment have been discussed with the patient and family. After consideration of risks, benefits and other options for treatment, the patient has consented to  Procedure(s): LEFT TOTAL KNEE ARTHROPLASTY (Left) as a surgical intervention .  The patient's history has been reviewed, patient examined, no change in status, stable for surgery.  I have reviewed the patient's chart and labs.  Questions were answered to the patient's satisfaction.     [name]

## 2016-12-22 NOTE — Brief Op Note (Signed)
12/22/2016  9:25 AM  PATIENT:  Debra Campbell  83 y.o. female  PRE-OPERATIVE DIAGNOSIS:  Left knee primary osteoarthritis and severe Flexion Contractures.Morbid Obesity.  POST-OPERATIVE DIAGNOSIS:  Left knee primary osteoarthritis and SEvere Flexion Contractures and Morbid Obesity.  PROCEDURE:  Procedure(s): LEFT TOTAL KNEE ARTHROPLASTY (Left)  SURGEON:  Surgeon(s) and Role:    * Latanya Maudlin, MD - Primary  PHYSICIAN ASSISTANT: Ardeen Jourdain PA  ASSISTANTS: Ardeen Jourdain PA  ANESTHESIA:   general and Adductor Block.  EBL:  Total I/O In: 1000 [I.V.:1000] Out: 250 [Urine:250]  BLOOD ADMINISTERED:none  DRAINS: (one) Hemovact drain(s) in the Left Knee with  Suction Open   LOCAL MEDICATIONS USED:  MARCAINE 20cc of 0.25% Plain and 20cc of Exparel mixed with 20cc of Normal Saline.     SPECIMEN:  No Specimen  DISPOSITION OF SPECIMEN:  N/A  COUNTS:  YES  TOURNIQUET:  * Missing tourniquet times found for documented tourniquets in log:  [id number] *  DICTATION: .Other Dictation: Dictation Number [id number]  PLAN OF CARE: Admit to inpatient   PATIENT DISPOSITION:  Stable in OR   Delay start of Pharmacological VTE agent (>24hrs) due to surgical blood loss or risk of bleeding: yes

## 2016-12-22 NOTE — Anesthesia Procedure Notes (Signed)
Procedure Name: Intubation Date/Time: 12/22/2016 7:45 AM Performed by: Danley Danker L Patient Re-evaluated:Patient Re-evaluated prior to inductionOxygen Delivery Method: Circle system utilized Preoxygenation: Pre-oxygenation with 100% oxygen Intubation Type: IV induction Ventilation: Mask ventilation without difficulty and Oral airway inserted - appropriate to patient size Laryngoscope Size: Sabra Heck and 2 Grade View: Grade I Tube type: Oral Tube size: 7.5 mm Number of attempts: 1 Airway Equipment and Method: Stylet Placement Confirmation: ETT inserted through vocal cords under direct vision,  positive ETCO2 and breath sounds checked- equal and bilateral Secured at: 21 cm Tube secured with: Tape Dental Injury: Teeth and Oropharynx as per pre-operative assessment

## 2016-12-23 ENCOUNTER — Encounter (HOSPITAL_COMMUNITY): Payer: Self-pay | Admitting: Orthopedic Surgery

## 2016-12-23 LAB — BASIC METABOLIC PANEL
Anion gap: 8 (ref 5–15)
BUN: 16 mg/dL (ref 6–20)
CO2: 24 mmol/L (ref 22–32)
CREATININE: 0.82 mg/dL (ref 0.44–1.00)
Calcium: 8.5 mg/dL — ABNORMAL LOW (ref 8.9–10.3)
Chloride: 102 mmol/L (ref 101–111)
GFR calc Af Amer: >60 mL/min (ref >60)
GLUCOSE: 169 mg/dL — AB (ref 65–99)
POTASSIUM: 4.5 mmol/L (ref 3.5–5.1)
SODIUM: 134 mmol/L — AB (ref 135–145)

## 2016-12-23 LAB — CBC
HCT: 32.4 % — ABNORMAL LOW (ref 36.0–46.0)
Hemoglobin: 10.6 g/dL — ABNORMAL LOW (ref 12.0–15.0)
MCH: 30.2 pg (ref 26.0–34.0)
MCHC: 32.7 g/dL (ref 30.0–36.0)
MCV: 92.3 fL (ref 78.0–100.0)
PLATELETS: 149 10*3/uL — AB (ref 150–400)
RBC: 3.51 MIL/uL — ABNORMAL LOW (ref 3.87–5.11)
RDW: 14.6 % (ref 11.5–15.5)
WBC: 8.2 10*3/uL (ref 4.0–10.5)

## 2016-12-23 LAB — GLUCOSE, CAPILLARY
GLUCOSE-CAPILLARY: 175 mg/dL — AB (ref 65–99)
GLUCOSE-CAPILLARY: 209 mg/dL — AB (ref 65–99)
Glucose-Capillary: 169 mg/dL — ABNORMAL HIGH (ref 65–99)
Glucose-Capillary: 184 mg/dL — ABNORMAL HIGH (ref 65–99)

## 2016-12-23 MED ORDER — METHOCARBAMOL 500 MG PO TABS: 500.0000 mg | ORAL_TABLET | Freq: Four times a day (QID) | ORAL | 1 refills | Status: DC | PRN

## 2016-12-23 MED ORDER — OXYCODONE-ACETAMINOPHEN 5-325 MG PO TABS: 1.0000 | ORAL_TABLET | ORAL | 0 refills | Status: DC | PRN

## 2016-12-23 MED ORDER — ASPIRIN EC 325 MG PO TBEC: 325.0000 mg | DELAYED_RELEASE_TABLET | Freq: Two times a day (BID) | ORAL | 0 refills | Status: DC

## 2016-12-23 NOTE — Discharge Instructions (Signed)
INSTRUCTIONS AFTER JOINT REPLACEMENT   Remove items at home which could result in a fall. This includes throw rugs or furniture in walking pathways ICE to the affected joint every three hours while awake for 30 minutes at a time, for at least the first 3-5 days, and then as needed for pain and swelling.  Continue to use ice for pain and swelling. You may notice swelling that will progress down to the foot and ankle.  This is normal after surgery.  Elevate your leg when you are not up walking on it.   Continue to use the breathing machine you got in the hospital (incentive spirometer) which will help keep your temperature down.  It is common for your temperature to cycle up and down following surgery, especially at night when you are not up moving around and exerting yourself.  The breathing machine keeps your lungs expanded and your temperature down.   DIET:  As you were doing prior to hospitalization, we recommend a well-balanced diet.  DRESSING / WOUND CARE / SHOWERING  You may change your dressing every day with sterile gauze.  Please use good hand washing techniques before changing the dressing.  Do not use any lotions or creams on the incision until instructed by your surgeon.  ACTIVITY  Increase activity slowly as tolerated, but follow the weight bearing instructions below.   No driving for 6 weeks or until further direction given by your physician.  You cannot drive while taking narcotics.  No lifting or carrying greater than 10 lbs. until further directed by your surgeon. Avoid periods of inactivity such as sitting longer than an hour when not asleep. This helps prevent blood clots.  You may return to work once you are authorized by your doctor.     WEIGHT BEARING   Weight bearing as tolerated with assist device (walker, cane, etc) as directed, use it as long as suggested by your surgeon or therapist, typically at least 4-6 weeks.   EXERCISES  Results after joint replacement  surgery are often greatly improved when you follow the exercise, range of motion and muscle strengthening exercises prescribed by your doctor. Safety measures are also important to protect the joint from further injury. Any time any of these exercises cause you to have increased pain or swelling, decrease what you are doing until you are comfortable again and then slowly increase them. If you have problems or questions, call your caregiver or physical therapist for advice.   Rehabilitation is important following a joint replacement. After just a few days of immobilization, the muscles of the leg can become weakened and shrink (atrophy).  These exercises are designed to build up the tone and strength of the thigh and leg muscles and to improve motion. Often times heat used for twenty to thirty minutes before working out will loosen up your tissues and help with improving the range of motion but do not use heat for the first two weeks following surgery (sometimes heat can increase post-operative swelling).   These exercises can be done on a training (exercise) mat, on the floor, on a table or on a bed. Use whatever works the best and is most comfortable for you.    Use music or television while you are exercising so that the exercises are a pleasant break in your day. This will make your life better with the exercises acting as a break in your routine that you can look forward to.   Perform all exercises about fifteen times,  three times per day or as directed.  You should exercise both the operative leg and the other leg as well.   Exercises include:   Quad Sets - Tighten up the muscle on the front of the thigh (Quad) and hold for 5-10 seconds.   Straight Leg Raises - With your knee straight (if you were given a brace, keep it on), lift the leg to 60 degrees, hold for 3 seconds, and slowly lower the leg.  Perform this exercise against resistance later as your leg gets stronger.  Leg Slides: Lying on your  back, slowly slide your foot toward your buttocks, bending your knee up off the floor (only go as far as is comfortable). Then slowly slide your foot back down until your leg is flat on the floor again.  Angel Wings: Lying on your back spread your legs to the side as far apart as you can without causing discomfort.  Hamstring Strength:  Lying on your back, push your heel against the floor with your leg straight by tightening up the muscles of your buttocks.  Repeat, but this time bend your knee to a comfortable angle, and push your heel against the floor.  You may put a pillow under the heel to make it more comfortable if necessary.   A rehabilitation program following joint replacement surgery can speed recovery and prevent re-injury in the future due to weakened muscles. Contact your doctor or a physical therapist for more information on knee rehabilitation.    CONSTIPATION  Constipation is defined medically as fewer than three stools per week and severe constipation as less than one stool per week.  Even if you have a regular bowel pattern at home, your normal regimen is likely to be disrupted due to multiple reasons following surgery.  Combination of anesthesia, postoperative narcotics, change in appetite and fluid intake all can affect your bowels.   YOU MUST use at least one of the following options; they are listed in order of increasing strength to get the job done.  They are all available over the counter, and you may need to use some, POSSIBLY even all of these options:    Drink plenty of fluids (prune juice may be helpful) and high fiber foods Colace 100 mg by mouth twice a day  Senokot for constipation as directed and as needed Dulcolax (bisacodyl), take with full glass of water  Miralax (polyethylene glycol) once or twice a day as needed.  If you have tried all these things and are unable to have a bowel movement in the first 3-4 days after surgery call either your surgeon or your  primary doctor.    If you experience loose stools or diarrhea, hold the medications until you stool forms back up.  If your symptoms do not get better within 1 week or if they get worse, check with your doctor.  If you experience "the worst abdominal pain ever" or develop nausea or vomiting, please contact the office immediately for further recommendations for treatment.   ITCHING:  If you experience itching with your medications, try taking only a single pain pill, or even half a pain pill at a time.  You can also use Benadryl over the counter for itching or also to help with sleep.   TED HOSE STOCKINGS:  Use stockings on both legs until for at least 2 weeks or as directed by physician office. They may be removed at night for sleeping.  MEDICATIONS:  See your medication summary on the "  After Visit Summary" that nursing will review with you.  You may have some home medications which will be placed on hold until you complete the course of blood thinner medication.  It is important for you to complete the blood thinner medication as prescribed.  PRECAUTIONS:  If you experience chest pain or shortness of breath - call 911 immediately for transfer to the hospital emergency department.   If you develop a fever greater that 101 F, purulent drainage from wound, increased redness or drainage from wound, foul odor from the wound/dressing, or calf pain - CONTACT YOUR SURGEON.                                                   FOLLOW-UP APPOINTMENTS:  If you do not already have a post-op appointment, please call the office for an appointment to be seen by your surgeon.  Guidelines for how soon to be seen are listed in your "After Visit Summary", but are typically between 1-4 weeks after surgery.  MAKE SURE YOU:  Understand these instructions.  Get help right away if you are not doing well or get worse.    Thank you for letting us be a part of your medical care team.  It is a privilege we respect greatly.   We hope these instructions will help you stay on track for a fast and full recovery!

## 2016-12-23 NOTE — Progress Notes (Signed)
OT Cancellation Note  Patient Details Name: Debra Campbell MRN: [id number] DOB: 12-Jan-1932   Cancelled Treatment:    Reason Eval/Treat Not Completed: Other (comment)  Pt is working with PT- will check on later in day or next day Debra Campbell, [place]  Payton Mccallum D 12/23/2016, 1:50 PM

## 2016-12-23 NOTE — Clinical Social Work Placement (Signed)
Patient has a bed at [facility]. CSW has completed FL2 & will continue to follow and assist with discharge when ready.    Raynaldo Opitz, [facility] Clinical Social Worker cell #: [phone]     CLINICAL SOCIAL WORK PLACEMENT  NOTE  Date:  12/23/2016  Patient Details  Name: Debra Campbell MRN: [id number] Date of Birth: [birth year]  Clinical Social Work is seeking post-discharge placement for this patient at the [place] level of care (*CSW will initial, date and re-position this form in  chart as items are completed):  Yes   Patient/family provided with [place] Work Department's list of facilities offering this level of care within the geographic area requested by the patient (or if unable, by the patient's family).  Yes   Patient/family informed of their freedom to choose among providers that offer the needed level of care, that participate in Medicare, Medicaid or managed care program needed by the patient, have an available bed and are willing to accept the patient.  Yes   Patient/family informed of [place]'s ownership interest in [facility] and [facility], as well as of the fact that they are under no obligation to receive care at these facilities.  PASRR submitted to EDS on       PASRR number received on       Existing PASRR number confirmed on 12/23/16     FL2 transmitted to all facilities in geographic area requested by pt/family on 12/23/16     FL2 transmitted to all facilities within larger geographic area on       Patient informed that his/her managed care company has contracts with or will negotiate with certain facilities, including the following:        Yes   Patient/family informed of bed offers received.  Patient chooses bed at [facility]     Physician recommends and patient chooses bed at      Patient to be transferred to [facility] on  .  Patient to be transferred to facility by       Patient family notified on   of transfer.  Name of family member notified:        PHYSICIAN       Additional Comment:    _______________________________________________ Standley Brooking, LCSW 12/23/2016, 1:13 PM

## 2016-12-23 NOTE — Clinical Social Work Placement (Signed)
   CLINICAL SOCIAL WORK PLACEMENT  NOTE  Date:  12/23/2016  Patient Details  Name: Debra Campbell MRN: [id number] Date of Birth: 03-12-1932  Clinical Social Work is seeking post-discharge placement for this patient at the [place] level of care (*CSW will initial, date and re-position this form in  chart as items are completed):  Yes   Patient/family provided with [place] Work Department's list of facilities offering this level of care within the geographic area requested by the patient (or if unable, by the patient's family).  Yes   Patient/family informed of their freedom to choose among providers that offer the needed level of care, that participate in Medicare, Medicaid or managed care program needed by the patient, have an available bed and are willing to accept the patient.  Yes   Patient/family informed of [place]'s ownership interest in [facility] and [facility], as well as of the fact that they are under no obligation to receive care at these facilities.  PASRR submitted to EDS on       PASRR number received on       Existing PASRR number confirmed on 12/23/16     FL2 transmitted to all facilities in geographic area requested by pt/family on 12/23/16     FL2 transmitted to all facilities within larger geographic area on       Patient informed that his/her managed care company has contracts with or will negotiate with certain facilities, including the following:            Patient/family informed of bed offers received.  Patient chooses bed at       Physician recommends and patient chooses bed at      Patient to be transferred to   on  .  Patient to be transferred to facility by       Patient family notified on   of transfer.  Name of family member notified:        PHYSICIAN       Additional Comment:    _______________________________________________ Standley Brooking, LCSW 12/23/2016, 12:45 PM

## 2016-12-23 NOTE — Care Management Note (Signed)
Case Management Note  Patient Details  Name: Debra Campbell MRN: [id number] Date of Birth: 1932-04-14  Subjective/Objective:  LEFT TOTAL KNEE ARTHROPLASTY                  Action/Plan: Plan to discharge to [facility], SNF/CSW following   Expected Discharge Date:  12/24/16               Expected Discharge Plan:  [place]  In-House Referral:  Clinical Social Work  Discharge planning Services  CM Consult  Post Acute Care Choice:  NA Choice offered to:  NA  DME Arranged:  N/A DME Agency:  NA  HH Arranged:  NA HH Agency:  NA  Status of Service:  Completed, signed off  If discussed at [employer], dates discussed:    Additional CommentsPurcell Mouton, RN 12/23/2016, 2:58 PM

## 2016-12-23 NOTE — Progress Notes (Signed)
Physical Therapy Treatment Patient Details Name: Debra Campbell MRN: [id number] DOB: May 25, 1932 Today's Date: 12/23/2016    History of Present Illness 83 yo female s/p L TKA 12/22/16. Hx of DM, Afib, neuropathy, morbid obesity.     PT Comments    Progressing slowly with mobility. Pt and family report husband will likely not provide assistance in home. Pt is not safe to d/c home without 24 hour supervision/assistance. Continue to recommend ST rehab at [facility].   Follow Up Recommendations  SNF     Equipment Recommendations  None recommended by PT    Recommendations for Other Services OT consult     Precautions / Restrictions Precautions Precautions: Fall Required Braces or Orthoses: Knee Immobilizer - Left Knee Immobilizer - Left: Discontinue once straight leg raise with < 10 degree lag Restrictions Weight Bearing Restrictions: No LLE Weight Bearing: Weight bearing as tolerated    Mobility  Bed Mobility Overal bed mobility: Needs Assistance Bed Mobility: Sit to Supine     Supine to sit: Mod assist;HOB elevated Sit to supine: Mod assist;HOB elevated   General bed mobility comments: Assist for LEs. Increased time.   Transfers Overall transfer level: Needs assistance Equipment used: Rolling walker (2 wheeled) Transfers: Sit to/from Stand Sit to Stand: Mod assist         General transfer comment: Assist to rise, stabilize, control descent. VCs safety, technique, hand/LE placement. Increased time.   Ambulation/Gait Ambulation/Gait assistance: Min assist Ambulation Distance (Feet): 15 Feet Assistive device: Rolling walker (2 wheeled) Gait Pattern/deviations: Step-to pattern;Antalgic     General Gait Details: Assist to stabilize pt and maneuver with RW. L knee buckling noted intermittently. VCs safety, technique, sequence, proper use of RW. Pt fatigues quickly.    Stairs            Wheelchair Mobility    Modified Rankin (Stroke Patients Only)        Balance                                    Cognition Arousal/Alertness: Awake/alert Behavior During Therapy: WFL for tasks assessed/performed Overall Cognitive Status: Within Functional Limits for tasks assessed                      Exercises     General Comments        Pertinent Vitals/Pain Pain Assessment: 0-10 Pain Score: 10-Worst pain ever Pain Location: L knee with activity Pain Descriptors / Indicators: Aching;Sore Pain Intervention(s): Limited activity within patient's tolerance;Repositioned;Patient requesting pain meds-RN notified    Home Living Family/patient expects to be discharged to:: Private residence Living Arrangements: Spouse/significant other Available Help at Discharge: Family Type of Home: House Home Access: Stairs to enter Entrance Stairs-Rails: None Home Layout: One level Home Equipment: Environmental consultant - 2 wheels;Cane - single point;Tub bench;Bedside commode;Wheelchair - manual      Prior Function            PT Goals (current goals can now be found in the care plan section) Acute Rehab PT Goals Patient Stated Goal: to go to rehab (MD plans for d/c home however) PT Goal Formulation: With patient/family Time For Goal Achievement: 01/06/17 Potential to Achieve Goals: Good Progress towards PT goals: Progressing toward goals    Frequency    7X/week      PT Plan Current plan remains appropriate    Co-evaluation  End of Session Equipment Utilized During Treatment: Gait belt;Left knee immobilizer Activity Tolerance: Patient limited by fatigue;Patient limited by pain Patient left: in bed;with call bell/phone within reach;with bed alarm set;with family/visitor present     Time: 1347-1406 PT Time Calculation (min) (ACUTE ONLY): 19 min  Charges:  $Gait Training: 8-22 mins                    G Codes:      Weston Anna, MPT Pager: [phone]

## 2016-12-23 NOTE — Progress Notes (Signed)
CSW received call from Sharee Pimple, case manager with Dr. Gladstone Lighter informing that patient's discharge plan prior to hospitalization was home with outpatient therapy. Museum/gallery conservator, PA spoke with patient's husband re: discharge plan, will see how she does with PT tomorrow. Patient has a bed at [facility] [phone] if given the ok for her to go. CSW will follow-up tomorrow.    Raynaldo Opitz, [facility] Clinical Social Worker cell #: [phone]

## 2016-12-23 NOTE — Op Note (Signed)
Debra Campbell, Debra Campbell               ACCOUNT NO.:  [id number]  MEDICAL RECORD NO.:  [id number]  LOCATION:                                 FACILITY:  PHYSICIAN:  Kipp Brood. Blaire Palomino, M.D.DATE OF BIRTH:  [birth year]  DATE OF PROCEDURE: DATE OF DISCHARGE:                              OPERATIVE REPORT   SURGEON:  Latanya Maudlin, MD.  ASSISTANT:  Ardeen Jourdain, [place].  PREOPERATIVE DIAGNOSES: 1. Morbid obesity. 2. Primary osteoarthritis with bone-on-bone and severe flexion     contracture, left knee.  POSTOPERATIVE DIAGNOSES: 1. Morbid obesity. 2. Primary osteoarthritis with bone-on-bone and severe flexion     contracture, left knee.  OPERATION:  Left total knee arthroplasty utilizing DePuy system.  All 3 components were cemented.  I used gentamicin in the cement.  At this time, the patient had 1.5 g of vancomycin.  Appropriate time-out was carried out.  We also marked the appropriate left leg in the holding area.  The leg was exsanguinated with Esmarch.  Tourniquet was elevated to 350 mmHg.  The left leg then was placed in [facility] knee holder. Anterior approach to the knee was carried out.  I then carried out the median parapatellar incision, reflected the patella laterally and did medial and lateral meniscectomies and excised the anterior posterior cruciate ligaments.  At this particular time, I did a synovectomy.  I then utilized a drill to make a drill hole in the intercondylar notch. Then, I inserted the guide rod up the femoral canal.  We had a nice alignment.  I then removed that, irrigated out the canal, and then removed 12 mm thickness off the distal femur.  At this time, I then measured the femur to be a size 4 left.  I then did my appropriate anterior and posterior chamfer cuts for a size left femoral component. Next, attention was directed to the tibial tray.  It measured to be a size 3.  We then made our initial drill hole in the tibial plateau.  The guide rod was  inserted down and then removed and then the canal was irrigated.  I then removed 4 mm thickness off the affected medial side of the tibia.  We then inserted our lamina spreaders, completed our soft tissue releases, and then at that point, we then inserted our spacer block where the 10 mm thickness spacer block fit nicely.  We had good stability.  We then removed that and then inserted our retractors again and then at this time, we completed the keel cut out of the proximal tibia in the usual fashion.  We then did our distal femoral cut in the usual fashion.  We then inserted our trial components; a size 4 left femoral component, size 3 tibial tray, and we inserted a 10 mm thickness insert size 4.  We had a nice fit and nice stability.  We then did a resurfacing procedure on the patella in the usual fashion.  We then made 3 drill holes in the articular surface of the patella.  We removed all trial components, thoroughly water picked out the knee and cemented all 3 components in simultaneously after we dried the knee out.  We then  removed all loose pieces of cement after the cement was hardened.  We then removed our trial component, water picked out the posterior aspect of the knee, made sure there were no other loose pieces of cement.  I then inserted some Gelfoam in the usual fashion and then inserted my permanent tibial tray, a size 4, 10 mm thickness, reduced the knee and had excellent function.  I then inserted a Hemovac drain.  The wound then was closed in the usual fashion over a Hemovac drain.  Sterile dressings were applied.  The patient left the operative room in satisfactory condition.  During the case, we injected 20 mL of 0.25% plain Marcaine and at the end we injected 20 mL of Exparel with 20 mL of normal saline.          ______________________________ Kipp Brood. Gladstone Lighter, M.D.     RAG/MEDQ  D:  12/22/2016  T:  12/23/2016  Job:  [id number]

## 2016-12-23 NOTE — Evaluation (Signed)
Physical Therapy Evaluation Patient Details Name: Debra Campbell MRN: [id number] DOB: [birth year] Today's Date: 12/23/2016   History of Present Illness  83 yo female s/p L TKA 12/22/16. Hx of DM, Afib, neuropathy, morbid obesity.   Clinical Impression  On eval, pt required Mod assist for mobility. She walked ~10 feet with a RW. Pain ~7/10 with activity. Husband present during session. Both pt and husband prefer d/c to SNF however they state MD has planned a d/c for home. Husband is able to minimally assist and there is no other help available to them at home. Recommendation is for ST rehab at SNF however I explained to pt and family that MD will make final decision concerning d/c. Will follow and progress activity as tolerated. If pt discharges home, recommend a home health aide to assist with ADLs.    Follow Up Recommendations SNF    Equipment Recommendations  None recommended by PT    Recommendations for Other Services OT consult     Precautions / Restrictions Precautions Precautions: Fall Required Braces or Orthoses: Knee Immobilizer - Left Knee Immobilizer - Left: Discontinue once straight leg raise with < 10 degree lag Restrictions Weight Bearing Restrictions: No LLE Weight Bearing: Weight bearing as tolerated      Mobility  Bed Mobility Overal bed mobility: Needs Assistance Bed Mobility: Supine to Sit     Supine to sit: Mod assist;HOB elevated     General bed mobility comments: Assist for trunk and LEs. Increased time. Pt used bedrail.   Transfers Overall transfer level: Needs assistance Equipment used: Rolling walker (2 wheeled) Transfers: Sit to/from Stand Sit to Stand: Mod assist;From elevated surface         General transfer comment: Assist to rise, stabilize, control descent. VCs safety, technique, hand/LE placement.   Ambulation/Gait Ambulation/Gait assistance: Min assist Ambulation Distance (Feet): 10 Feet Assistive device: Rolling walker (2  wheeled) Gait Pattern/deviations: Step-through pattern;Trunk flexed;Antalgic     General Gait Details: Assist to stabilize pt and maneuver with RW. L knee buckling noted intermittently. VCs safety, technique, sequence, proper use of RW. Pt fatigues quickly. Recliner follow needed.   Stairs            Wheelchair Mobility    Modified Rankin (Stroke Patients Only)       Balance                                             Pertinent Vitals/Pain Pain Assessment: 0-10 Pain Score: 7  Pain Location: L knee with activity Pain Descriptors / Indicators: Aching;Sore Pain Intervention(s): Limited activity within patient's tolerance;Repositioned;Premedicated before session    [place] expects to be discharged to:: Private residence Living Arrangements: Spouse/significant other Available Help at Discharge: Family Type of Home: House Home Access: Stairs to enter Entrance Stairs-Rails: None Entrance Stairs-Number of Steps: 3 Home Layout: One level Home Equipment: Environmental consultant - 2 wheels;Cane - single point;Tub bench;Bedside commode;Wheelchair - manual      Prior Function                 Hand Dominance        Extremity/Trunk Assessment   Upper Extremity Assessment Upper Extremity Assessment: Defer to OT evaluation    Lower Extremity Assessment Lower Extremity Assessment: Generalized weakness (s/p L TKA)    Cervical / Trunk Assessment Cervical / Trunk Assessment: Normal  Communication  Communication: No difficulties  Cognition Arousal/Alertness: Awake/alert Behavior During Therapy: WFL for tasks assessed/performed Overall Cognitive Status: Within Functional Limits for tasks assessed                      General Comments      Exercises Total Joint Exercises Ankle Circles/Pumps: AROM;Both;10 reps;Supine Quad Sets: AROM;Both;10 reps;Supine Heel Slides: AAROM;Left;10 reps;Supine Hip ABduction/ADduction: AAROM;Left;10  reps;Supine Straight Leg Raises: AAROM;Left;10 reps;Supine Goniometric ROM: ~5-65 degrees   Assessment/Plan    PT Assessment Patient needs continued PT services  PT Problem List Decreased strength;Decreased mobility;Decreased range of motion;Decreased activity tolerance;Decreased balance;Decreased knowledge of use of DME;Pain          PT Treatment Interventions DME instruction;Therapeutic activities;Gait training;Therapeutic exercise;Patient/family education;Stair training;Balance training;Functional mobility training    PT Goals (Current goals can be found in the Care Plan section)  Acute Rehab PT Goals Patient Stated Goal: to go to rehab (MD plans for d/c home however) PT Goal Formulation: With patient/family Time For Goal Achievement: 01/06/17 Potential to Achieve Goals: Good    Frequency 7X/week   Barriers to discharge Decreased caregiver support husband is only able to minimally assist    Co-evaluation               End of Session Equipment Utilized During Treatment: Gait belt;Left knee immobilizer Activity Tolerance: Patient limited by fatigue;Patient limited by pain Patient left: in chair;with call bell/phone within reach;with family/visitor present;with chair alarm set           Time: [id number] PT Time Calculation (min) (ACUTE ONLY): 38 min   Charges:   PT Evaluation $PT Eval Low Complexity: 1 Procedure PT Treatments $Gait Training: 8-22 mins   PT G Codes:        Weston Anna, MPT Pager: [phone]

## 2016-12-23 NOTE — Clinical Social Work Note (Signed)
Clinical Social Work Assessment  Patient Details  Name: Debra Campbell MRN: [id number] Date of Birth: [birth year]  Date of referral:  12/23/16               Reason for consult:  Facility Placement                Permission sought to share information with:  Chartered certified accountant granted to share information::  Yes, Verbal Permission Granted  Name::        Agency::     Relationship::     Contact Information:     Housing/Transportation Living arrangements for the past 2 months:  Single Family Home Source of Information:  Patient, Spouse, Adult Children Patient Interpreter Needed:  None Criminal Activity/Legal Involvement Pertinent to Current Situation/Hospitalization:  No - Comment as needed Significant Relationships:  Adult Children, Spouse Lives with:  Spouse Do you feel safe going back to the place where you live?  No Need for family participation in patient care:  Yes (Comment)  Care giving concerns:  CSW reviewed PT evaluation recommending SNF.    Social Worker assessment / plan:  CSW spoke with patient, husband & daughter at bedside re: discharge planning - patient is agreeable with plan for SNF. Patient expressed interest in [facility] or [facility]. CSW awaiting calls back from both facilities re: bed offers.   Employment status:  Retired Forensic scientist:  [employer] PT Recommendations:  [place] / Referral to community resources:  [place]  Patient/Family's Response to care:    Patient/Family's Understanding of and Emotional Response to Diagnosis, Current Treatment, and Prognosis:    Emotional Assessment Appearance:  Appears stated age Attitude/Demeanor/Rapport:    Affect (typically observed):    Orientation:  Oriented to Self, Oriented to Place, Oriented to  Time, Oriented to Situation Alcohol / Substance use:    Psych involvement (Current and /or in the community):     Discharge Needs   Concerns to be addressed:    Readmission within the last 30 days:    Current discharge risk:    Barriers to Discharge:      Standley Brooking, LCSW 12/23/2016, 12:44 PM

## 2016-12-23 NOTE — Progress Notes (Signed)
   Subjective: 1 Day Post-Op Procedure(s) (LRB): LEFT TOTAL KNEE ARTHROPLASTY (Left) Patient reports pain as mild.   Patient seen in rounds for Dr. Gladstone Lighter. Patient is well, and has had no acute complaints or problems other than soreness in the knee. She reports that her pain is better managed than last night. No SOB or chest pain. She denies palpitations.  Plan is to go Home after hospital stay.  Objective: Vital signs in last 24 hours: Temp:  [97.5 F (36.4 C)-99.2 F (37.3 C)] 97.5 F (36.4 C) (01/25 0456) Pulse Rate:  [87-106] 106 (01/25 0456) Resp:  [6-19] 18 (01/25 0456) BP: (119-147)/(61-109) 119/61 (01/25 0456) SpO2:  [96 %-100 %] 97 % (01/25 0456)  Intake/Output from previous day:  Intake/Output Summary (Last 24 hours) at 12/23/16 0734 Last data filed at 12/23/16 0600  Gross per 24 hour  Intake          3643.33 ml  Output             1220 ml  Net          2423.33 ml     Labs:  Recent Labs  12/23/16 0523  HGB 10.6*    Recent Labs  12/23/16 0523  WBC 8.2  RBC 3.51*  HCT 32.4*  PLT 149*    Recent Labs  12/23/16 0523  NA 134*  K 4.5  CL 102  CO2 24  BUN 16  CREATININE 0.82  GLUCOSE 169*  CALCIUM 8.5*    EXAM General - Patient is Alert and Oriented Extremity - Neurologically intact Intact pulses distally Dorsiflexion/Plantar flexion intact No cellulitis present Compartment soft Dressing - dressing C/D/I Motor Function - intact, moving foot and toes well on exam.  Hemovac pulled without difficulty.  Past Medical History:  Diagnosis Date  . Arthritis    legs, lower back, hands  . Cancer ([place])    endometrial  . Diabetes mellitus   . Ectopic pregnancy   . Eczema   . Headache(784.0)    hx of  . History of kidney stones   . Hyperlipidemia    no meds  . Hypertension   . Hypothyroidism   . Persistent atrial fibrillation ([place]) 09/28/2016   Relatively new Dx: Asymptomatic.  Rate controlled without medication.  . SVD (spontaneous  vaginal delivery)    x 3  . Thyroid disease     Assessment/Plan: 1 Day Post-Op Procedure(s) (LRB): LEFT TOTAL KNEE ARTHROPLASTY (Left) Active Problems:   Status post total left knee replacement   S/P TKR (total knee replacement)  Estimated body mass index is 38.62 kg/m as calculated from the following:   Height as of this encounter: 5\' 4"  (1.626 m).   Weight as of this encounter: 102.1 kg (225 lb). Advance diet Up with therapy D/C IV fluids when tolerating fluids  DVT Prophylaxis - Xarelto Weight-Bearing as tolerated  D/C O2 and Pulse OX and try on Room Air  Keylen is doing fair this morning. Will start therapy today. Encouraged her to drink plenty of water. Urine appears concentrated. Plan for DC home tomorrow if she progresses well with therapy. Will follow hyponatremia.   Ardeen Jourdain, PA-C Orthopaedic Surgery 12/23/2016, 7:34 AM

## 2016-12-23 NOTE — NC FL2 (Signed)
[place] LEVEL OF CARE SCREENING TOOL     IDENTIFICATION  Patient Name: Debra Campbell Birthdate: 1932-11-12 Sex: female Admission Date (Current Location): 12/22/2016  [facility] and [place] Number:  Herbalist and Address:  [facility],  [place] [address], [place]      Provider Number: [id number]  Attending Physician Name and Address:  Latanya Maudlin, MD  Relative Name and Phone Number:       Current Level of Care:   Recommended Level of Care: [place] Prior Approval Number:    Date Approved/Denied:   PASRR Number: [id number]  Discharge Plan: SNF    Current Diagnoses: Patient Active Problem List   Diagnosis Date Noted  . Status post total left knee replacement 12/22/2016  . S/P TKR (total knee replacement) 12/22/2016  . Preoperative cardiovascular examination 10/14/2016  . New onset a-fib ([place]) 10/14/2016  . Persistent atrial fibrillation ([place]) 09/28/2016  . Morbid obesity ([place]) 08/18/2015  . Type 2 diabetes mellitus with diabetic polyneuropathy ([place]) 05/01/2015  . Endometrial cancer ([place]) 05/08/2013  . Essential hypertension 03/18/2011  . Hypothyroidism 03/18/2011  . Osteoarthritis 03/18/2011  . Osteopenia 03/18/2011  . Hyperlipidemia 03/18/2011  . Diabetic neuropathy ([place]) 03/18/2011    Orientation RESPIRATION BLADDER Height & Weight     Self, Time, Situation, Place  O2 (2L) Continent Weight: 225 lb (102.1 kg) Height:  5\' 4"  (162.6 cm)  BEHAVIORAL SYMPTOMS/MOOD NEUROLOGICAL BOWEL NUTRITION STATUS      Continent Diet (Carb Modified)  AMBULATORY STATUS COMMUNICATION OF NEEDS Skin   Extensive Assist Verbally Surgical wounds (Incision (Closed) 12/22/16 Knee Left)                       Personal Care Assistance Level of Assistance  Bathing, Dressing Bathing Assistance: Limited assistance   Dressing Assistance: Limited assistance     Functional Limitations Info             SPECIAL  CARE FACTORS FREQUENCY  PT (By licensed PT), OT (By licensed OT)     PT Frequency: 5 OT Frequency: 5            Contractures      Additional Factors Info  Code Status, Allergies Code Status Info: Fullcode Allergies Info: Ace Inhibitors, Iohexol, Statins, Iodine, Penicillins, Sulfa Antibiotics           Current Medications (12/23/2016):  This is the current hospital active medication list Current Facility-Administered Medications  Medication Dose Route Frequency Provider Last Rate Last Dose  . acetaminophen (TYLENOL) tablet 650 mg  650 mg Oral Q6H PRN Latanya Maudlin, MD       Or  . acetaminophen (TYLENOL) suppository 650 mg  650 mg Rectal Q6H PRN Latanya Maudlin, MD      . alum & mag hydroxide-simeth (MAALOX/MYLANTA) 200-200-20 MG/5ML suspension 30 mL  30 mL Oral Q4H PRN Latanya Maudlin, MD      . bisacodyl (DULCOLAX) EC tablet 5 mg  5 mg Oral Daily PRN Latanya Maudlin, MD      . ferrous sulfate tablet 325 mg  325 mg Oral TID PC Latanya Maudlin, MD   325 mg at 12/23/16 0853  . furosemide (LASIX) tablet 20 mg  20 mg Oral Daily PRN Latanya Maudlin, MD      . gabapentin (NEURONTIN) capsule 300 mg  300 mg Oral QHS Latanya Maudlin, MD   300 mg at 12/22/16 2108  . HYDROcodone-acetaminophen (NORCO/VICODIN) 5-325 MG per tablet 1-2  tablet  1-2 tablet Oral Q4H PRN Latanya Maudlin, MD   2 tablet at 12/23/16 1017  . HYDROmorphone (DILAUDID) injection 0.5 mg  0.5 mg Intravenous Q2H PRN Latanya Maudlin, MD   0.5 mg at 12/23/16 0121  . insulin aspart (novoLOG) injection 0-9 Units  0-9 Units Subcutaneous TID WC Latanya Maudlin, MD   2 Units at 12/23/16 [id number]  . lactated ringers infusion   Intravenous Continuous Latanya Maudlin, MD 100 mL/hr at 12/23/16 0357    . levothyroxine (SYNTHROID, LEVOTHROID) tablet 175 mcg  175 mcg Oral QAC breakfast Latanya Maudlin, MD   175 mcg at 12/23/16 [phone]  . losartan (COZAAR) tablet 50 mg  50 mg Oral QHS Latanya Maudlin, MD   50 mg at 12/22/16 2108  . menthol-cetylpyridinium  (CEPACOL) lozenge 3 mg  1 lozenge Oral PRN Latanya Maudlin, MD       Or  . phenol (CHLORASEPTIC) mouth spray 1 spray  1 spray Mouth/Throat PRN Latanya Maudlin, MD      . methocarbamol (ROBAXIN) tablet 500 mg  500 mg Oral Q6H PRN Latanya Maudlin, MD   500 mg at 12/23/16 1017   Or  . methocarbamol (ROBAXIN) 500 mg in dextrose 5 % 50 mL IVPB  500 mg Intravenous Q6H PRN Latanya Maudlin, MD   500 mg at 12/22/16 1324  . metoprolol succinate (TOPROL-XL) 24 hr tablet 25 mg  25 mg Oral Daily Latanya Maudlin, MD   25 mg at 12/23/16 0900  . ondansetron (ZOFRAN) tablet 4 mg  4 mg Oral Q6H PRN Latanya Maudlin, MD       Or  . ondansetron [facility]) injection 4 mg  4 mg Intravenous Q6H PRN Latanya Maudlin, MD   4 mg at 12/22/16 1253  . oxyCODONE-acetaminophen (PERCOCET/ROXICET) 5-325 MG per tablet 2 tablet  2 tablet Oral Q4H PRN Latanya Maudlin, MD      . polyethylene glycol (MIRALAX / GLYCOLAX) packet 17 g  17 g Oral Daily PRN Latanya Maudlin, MD      . rivaroxaban [facility]) tablet 10 mg  10 mg Oral Q breakfast Latanya Maudlin, MD   10 mg at 12/23/16 0853  . sodium phosphate (FLEET) 7-19 GM/118ML enema 1 enema  1 enema Rectal Once PRN Latanya Maudlin, MD         Discharge Medications: Please see discharge summary for a list of discharge medications.  Relevant Imaging Results:  Relevant Lab Results:   Additional Information SSN: [id number]  Standley Brooking, LCSW

## 2016-12-24 DIAGNOSIS — E1165 Type 2 diabetes mellitus with hyperglycemia: Secondary | ICD-10-CM | POA: Diagnosis not present

## 2016-12-24 DIAGNOSIS — M179 Osteoarthritis of knee, unspecified: Secondary | ICD-10-CM | POA: Diagnosis not present

## 2016-12-24 DIAGNOSIS — E039 Hypothyroidism, unspecified: Secondary | ICD-10-CM | POA: Diagnosis not present

## 2016-12-24 DIAGNOSIS — Z96652 Presence of left artificial knee joint: Secondary | ICD-10-CM | POA: Diagnosis not present

## 2016-12-24 DIAGNOSIS — E1140 Type 2 diabetes mellitus with diabetic neuropathy, unspecified: Secondary | ICD-10-CM | POA: Diagnosis not present

## 2016-12-24 DIAGNOSIS — M170 Bilateral primary osteoarthritis of knee: Secondary | ICD-10-CM | POA: Diagnosis not present

## 2016-12-24 DIAGNOSIS — D649 Anemia, unspecified: Secondary | ICD-10-CM | POA: Diagnosis not present

## 2016-12-24 DIAGNOSIS — I4891 Unspecified atrial fibrillation: Secondary | ICD-10-CM | POA: Diagnosis not present

## 2016-12-24 DIAGNOSIS — M62838 Other muscle spasm: Secondary | ICD-10-CM | POA: Diagnosis not present

## 2016-12-24 DIAGNOSIS — R279 Unspecified lack of coordination: Secondary | ICD-10-CM | POA: Diagnosis not present

## 2016-12-24 DIAGNOSIS — C541 Malignant neoplasm of endometrium: Secondary | ICD-10-CM | POA: Diagnosis not present

## 2016-12-24 DIAGNOSIS — M8580 Other specified disorders of bone density and structure, unspecified site: Secondary | ICD-10-CM | POA: Diagnosis not present

## 2016-12-24 DIAGNOSIS — E1142 Type 2 diabetes mellitus with diabetic polyneuropathy: Secondary | ICD-10-CM | POA: Diagnosis not present

## 2016-12-24 DIAGNOSIS — R269 Unspecified abnormalities of gait and mobility: Secondary | ICD-10-CM | POA: Diagnosis not present

## 2016-12-24 DIAGNOSIS — Z96651 Presence of right artificial knee joint: Secondary | ICD-10-CM | POA: Diagnosis not present

## 2016-12-24 DIAGNOSIS — M6281 Muscle weakness (generalized): Secondary | ICD-10-CM | POA: Diagnosis not present

## 2016-12-24 DIAGNOSIS — E785 Hyperlipidemia, unspecified: Secondary | ICD-10-CM | POA: Diagnosis not present

## 2016-12-24 DIAGNOSIS — I10 Essential (primary) hypertension: Secondary | ICD-10-CM | POA: Diagnosis not present

## 2016-12-24 DIAGNOSIS — M1990 Unspecified osteoarthritis, unspecified site: Secondary | ICD-10-CM | POA: Diagnosis not present

## 2016-12-24 DIAGNOSIS — R609 Edema, unspecified: Secondary | ICD-10-CM | POA: Diagnosis not present

## 2016-12-24 LAB — CBC
HEMATOCRIT: 33.5 % — AB (ref 36.0–46.0)
HEMOGLOBIN: 10.8 g/dL — AB (ref 12.0–15.0)
MCH: 29.8 pg (ref 26.0–34.0)
MCHC: 32.2 g/dL (ref 30.0–36.0)
MCV: 92.5 fL (ref 78.0–100.0)
Platelets: 130 10*3/uL — ABNORMAL LOW (ref 150–400)
RBC: 3.62 MIL/uL — AB (ref 3.87–5.11)
RDW: 14.4 % (ref 11.5–15.5)
WBC: 8.1 10*3/uL (ref 4.0–10.5)

## 2016-12-24 LAB — BASIC METABOLIC PANEL
ANION GAP: 6 (ref 5–15)
BUN: 15 mg/dL (ref 6–20)
CHLORIDE: 100 mmol/L — AB (ref 101–111)
CO2: 29 mmol/L (ref 22–32)
Calcium: 8.8 mg/dL — ABNORMAL LOW (ref 8.9–10.3)
Creatinine, Ser: 0.94 mg/dL (ref 0.44–1.00)
GFR calc Af Amer: >60 mL/min (ref >60)
GFR calc non Af Amer: 55 mL/min — ABNORMAL LOW (ref >60)
GLUCOSE: 159 mg/dL — AB (ref 65–99)
POTASSIUM: 4.4 mmol/L (ref 3.5–5.1)
Sodium: 135 mmol/L (ref 135–145)

## 2016-12-24 LAB — GLUCOSE, CAPILLARY
GLUCOSE-CAPILLARY: 212 mg/dL — AB (ref 65–99)
Glucose-Capillary: 143 mg/dL — ABNORMAL HIGH (ref 65–99)

## 2016-12-24 NOTE — Discharge Summary (Signed)
Physician Discharge Summary   Patient ID: Debra Campbell MRN: [id number] DOB/AGE: 06-Apr-1932 83 y.o.  Admit date: 12/22/2016 Discharge date: 12/24/2016  Primary Diagnosis: Primary osteoarthritis left knee  Admission Diagnoses:  Past Medical History:  Diagnosis Date  . Arthritis    legs, lower back, hands  . Cancer ([place])    endometrial  . Diabetes mellitus   . Ectopic pregnancy   . Eczema   . Headache(784.0)    hx of  . History of kidney stones   . Hyperlipidemia    no meds  . Hypertension   . Hypothyroidism   . Persistent atrial fibrillation ([place]) 09/28/2016   Relatively new Dx: Asymptomatic.  Rate controlled without medication.  . SVD (spontaneous vaginal delivery)    x 3  . Thyroid disease    Discharge Diagnoses:   Active Problems:   Status post total left knee replacement   S/P TKR (total knee replacement)  Estimated body mass index is 38.62 kg/m as calculated from the following:   Height as of this encounter: _0  (1.626 m).   Weight as of this encounter: 102.1 kg (225 lb).  Procedure:  Procedure(s) (LRB): LEFT TOTAL KNEE ARTHROPLASTY (Left)   Consults: None  HPI: Debra Campbell, 83 y.o. female, has a history of pain and functional disability in the left knee due to arthritis and has failed non-surgical conservative treatments for greater than 12 weeks to includeNSAID's and/or analgesics, corticosteriod injections, flexibility and strengthening excercises and activity modification.  Onset of symptoms was gradual, starting 2 years ago with gradually worsening course since that time. The patient noted prior procedures on the knee to include  arthroscopy and menisectomy on the left knee(s).  Patient currently rates pain in the left knee(s) at 8 out of 10 with activity. Patient has night pain, worsening of pain with activity and weight bearing, pain that interferes with activities of daily living, pain with passive range of motion, crepitus and joint swelling.   Patient has evidence of periarticular osteophytes and joint space narrowing by imaging studies. There is no active infection.  Laboratory Data: Admission on 12/22/2016  Component Date Value Ref Range Status  . Glucose-Capillary 12/22/2016 148* 65 - 99 mg/dL Final  . Comment 1 12/22/2016 Notify RN   Final  . Glucose-Capillary 12/22/2016 162* 65 - 99 mg/dL Final  . Glucose-Capillary 12/22/2016 228* 65 - 99 mg/dL Final  . WBC 12/23/2016 8.2  4.0 - 10.5 K/uL Final  . RBC 12/23/2016 3.51* 3.87 - 5.11 MIL/uL Final  . Hemoglobin 12/23/2016 10.6* 12.0 - 15.0 g/dL Final  . HCT 12/23/2016 32.4* 36.0 - 46.0 % Final  . MCV 12/23/2016 92.3  78.0 - 100.0 fL Final  . MCH 12/23/2016 30.2  26.0 - 34.0 pg Final  . MCHC 12/23/2016 32.7  30.0 - 36.0 g/dL Final  . RDW 12/23/2016 14.6  11.5 - 15.5 % Final  . Platelets 12/23/2016 149* 150 - 400 K/uL Final  . Sodium 12/23/2016 134* 135 - 145 mmol/L Final  . Potassium 12/23/2016 4.5  3.5 - 5.1 mmol/L Final  . Chloride 12/23/2016 102  101 - 111 mmol/L Final  . CO2 12/23/2016 24  22 - 32 mmol/L Final  . Glucose, Bld 12/23/2016 169* 65 - 99 mg/dL Final  . BUN 12/23/2016 16  6 - 20 mg/dL Final  . Creatinine, Ser 12/23/2016 0.82  0.44 - 1.00 mg/dL Final  . Calcium 12/23/2016 8.5* 8.9 - 10.3 mg/dL Final  . GFR calc non Af Amer 12/23/2016 >  60  >60 mL/min Final  . GFR calc Af Amer 12/23/2016 >60  >60 mL/min Final   Comment: (NOTE) The eGFR has been calculated using the CKD EPI equation. This calculation has not been validated in all clinical situations. eGFR's persistently <60 mL/min signify possible Chronic Kidney Disease.   . Anion gap 12/23/2016 8  5 - 15 Final  . Glucose-Capillary 12/22/2016 217* 65 - 99 mg/dL Final  . Glucose-Capillary 12/22/2016 190* 65 - 99 mg/dL Final  . Glucose-Capillary 12/23/2016 175* 65 - 99 mg/dL Final  . Glucose-Capillary 12/23/2016 169* 65 - 99 mg/dL Final  . WBC 12/24/2016 8.1  4.0 - 10.5 K/uL Final  . RBC 12/24/2016 3.62*  3.87 - 5.11 MIL/uL Final  . Hemoglobin 12/24/2016 10.8* 12.0 - 15.0 g/dL Final  . HCT 12/24/2016 33.5* 36.0 - 46.0 % Final  . MCV 12/24/2016 92.5  78.0 - 100.0 fL Final  . MCH 12/24/2016 29.8  26.0 - 34.0 pg Final  . MCHC 12/24/2016 32.2  30.0 - 36.0 g/dL Final  . RDW 12/24/2016 14.4  11.5 - 15.5 % Final  . Platelets 12/24/2016 130* 150 - 400 K/uL Final  . Sodium 12/24/2016 135  135 - 145 mmol/L Final  . Potassium 12/24/2016 4.4  3.5 - 5.1 mmol/L Final  . Chloride 12/24/2016 100* 101 - 111 mmol/L Final  . CO2 12/24/2016 29  22 - 32 mmol/L Final  . Glucose, Bld 12/24/2016 159* 65 - 99 mg/dL Final  . BUN 12/24/2016 15  6 - 20 mg/dL Final  . Creatinine, Ser 12/24/2016 0.94  0.44 - 1.00 mg/dL Final  . Calcium 12/24/2016 8.8* 8.9 - 10.3 mg/dL Final  . GFR calc non Af Amer 12/24/2016 55* >60 mL/min Final  . GFR calc Af Amer 12/24/2016 >60  >60 mL/min Final   Comment: (NOTE) The eGFR has been calculated using the CKD EPI equation. This calculation has not been validated in all clinical situations. eGFR's persistently <60 mL/min signify possible Chronic Kidney Disease.   . Anion gap 12/24/2016 6  5 - 15 Final  . Glucose-Capillary 12/23/2016 184* 65 - 99 mg/dL Final  . Glucose-Capillary 12/23/2016 209* 65 - 99 mg/dL Final  Hospital Outpatient Visit on 12/17/2016  Component Date Value Ref Range Status  . Glucose-Capillary 12/17/2016 119* 65 - 99 mg/dL Final  . MRSA, PCR 12/17/2016 NEGATIVE  NEGATIVE Final  . Staphylococcus aureus 12/17/2016 NEGATIVE  NEGATIVE Final   Comment:        The Xpert SA Assay (FDA approved for NASAL specimens in patients over 41 years of age), is one component of a comprehensive surveillance program.  Test performance has been validated by [facility] for patients greater than or equal to 18 year old. It is not intended to diagnose infection nor to guide or monitor treatment.   [name] aPTT 12/17/2016 29  24 - 36 seconds Final  . WBC 12/17/2016 6.5  4.0 - 10.5  K/uL Final  . RBC 12/17/2016 4.18  3.87 - 5.11 MIL/uL Final  . Hemoglobin 12/17/2016 12.5  12.0 - 15.0 g/dL Final  . HCT 12/17/2016 37.7  36.0 - 46.0 % Final  . MCV 12/17/2016 90.2  78.0 - 100.0 fL Final  . MCH 12/17/2016 29.9  26.0 - 34.0 pg Final  . MCHC 12/17/2016 33.2  30.0 - 36.0 g/dL Final  . RDW 12/17/2016 14.3  11.5 - 15.5 % Final  . Platelets 12/17/2016 184  150 - 400 K/uL Final  . Neutrophils Relative % 12/17/2016 55  %  Final  . Neutro Abs 12/17/2016 3.6  1.7 - 7.7 K/uL Final  . Lymphocytes Relative 12/17/2016 33  % Final  . Lymphs Abs 12/17/2016 2.2  0.7 - 4.0 K/uL Final  . Monocytes Relative 12/17/2016 7  % Final  . Monocytes Absolute 12/17/2016 0.5  0.1 - 1.0 K/uL Final  . Eosinophils Relative 12/17/2016 4  % Final  . Eosinophils Absolute 12/17/2016 0.3  0.0 - 0.7 K/uL Final  . Basophils Relative 12/17/2016 1  % Final  . Basophils Absolute 12/17/2016 0.0  0.0 - 0.1 K/uL Final  . Sodium 12/17/2016 141  135 - 145 mmol/L Final  . Potassium 12/17/2016 4.9  3.5 - 5.1 mmol/L Final  . Chloride 12/17/2016 108  101 - 111 mmol/L Final  . CO2 12/17/2016 28  22 - 32 mmol/L Final  . Glucose, Bld 12/17/2016 108* 65 - 99 mg/dL Final  . BUN 12/17/2016 15  6 - 20 mg/dL Final  . Creatinine, Ser 12/17/2016 0.97  0.44 - 1.00 mg/dL Final  . Calcium 12/17/2016 9.6  8.9 - 10.3 mg/dL Final  . Total Protein 12/17/2016 7.0  6.5 - 8.1 g/dL Final  . Albumin 12/17/2016 4.5  3.5 - 5.0 g/dL Final  . AST 12/17/2016 24  15 - 41 U/L Final  . ALT 12/17/2016 17  14 - 54 U/L Final  . Alkaline Phosphatase 12/17/2016 47  38 - 126 U/L Final  . Total Bilirubin 12/17/2016 0.6  0.3 - 1.2 mg/dL Final  . GFR calc non Af Amer 12/17/2016 53* >60 mL/min Final  . GFR calc Af Amer 12/17/2016 >60  >60 mL/min Final   Comment: (NOTE) The eGFR has been calculated using the CKD EPI equation. This calculation has not been validated in all clinical situations. eGFR's persistently <60 mL/min signify possible Chronic  Kidney Disease.   . Anion gap 12/17/2016 5  5 - 15 Final  . Prothrombin Time 12/17/2016 13.5  11.4 - 15.2 seconds Final  . INR 12/17/2016 1.02   Final  . ABO/RH(D) 12/17/2016 A POS   Final  . Antibody Screen 12/17/2016 NEG   Final  . Sample Expiration 12/17/2016 12/25/2016   Final  . Extend sample reason 12/17/2016 NO TRANSFUSIONS OR PREGNANCY IN THE PAST 3 MONTHS   Final  . Color, Urine 12/17/2016 YELLOW  YELLOW Final  . APPearance 12/17/2016 CLEAR  CLEAR Final  . Specific Gravity, Urine 12/17/2016 1.020  1.005 - 1.030 Final  . pH 12/17/2016 5.0  5.0 - 8.0 Final  . Glucose, UA 12/17/2016 NEGATIVE  NEGATIVE mg/dL Final  . Hgb urine dipstick 12/17/2016 NEGATIVE  NEGATIVE Final  . Bilirubin Urine 12/17/2016 NEGATIVE  NEGATIVE Final  . Ketones, ur 12/17/2016 NEGATIVE  NEGATIVE mg/dL Final  . Protein, ur 12/17/2016 NEGATIVE  NEGATIVE mg/dL Final  . Nitrite 12/17/2016 NEGATIVE  NEGATIVE Final  . Leukocytes, UA 12/17/2016 SMALL* NEGATIVE Final  . RBC / HPF 12/17/2016 0-5  0 - 5 RBC/hpf Final  . WBC, UA 12/17/2016 6-30  0 - 5 WBC/hpf Final  . Bacteria, UA 12/17/2016 NONE SEEN  NONE SEEN Final  . Squamous Epithelial / LPF 12/17/2016 0-5* NONE SEEN Final  . Mucous 12/17/2016 PRESENT   Final  . Hgb A1c MFr Bld 12/17/2016 6.2* 4.8 - 5.6 % Final   Comment: (NOTE)         Pre-diabetes: 5.7 - 6.4         Diabetes: >6.4         Glycemic control for adults  with diabetes: <7.0   . Mean Plasma Glucose 12/17/2016 131  mg/dL Final   Comment: (NOTE) Performed At: [facility] [place], [place] [id number] Lindon Romp MD PY:[id number]      X-Rays:Dg Chest 2 View  Result Date: 12/17/2016 CLINICAL DATA:  Preoperative evaluation for LEFT knee surgery, shortness of breath with ambulation with a walker, hypertension, type II diabetes mellitus, atrial fibrillation EXAM: CHEST  2 VIEW COMPARISON:  05/01/2015 FINDINGS: Enlargement of cardiac silhouette. Mediastinal contours and  pulmonary vascularity normal. Lungs clear. No pleural effusion or pneumothorax. Diffuse osseous demineralization with scattered degenerative disc disease changes thoracic spine. IMPRESSION: Enlargement of cardiac silhouette. No acute abnormalities. Electronically Signed   By: Lavonia Dana M.D.   On: 12/17/2016 16:58    EKG: Orders placed or performed during the hospital encounter of 12/17/16  . EKG  . EKG     Hospital Course: Debra Campbell is a 83 y.o. who was admitted to [facility]. They were brought to the operating room on 12/22/2016 and underwent Procedure(s): LEFT TOTAL KNEE ARTHROPLASTY.  Patient tolerated the procedure well and was later transferred to the recovery room and then to telemetry floor for postoperative care and for monitoring of newly diagnosed atrial fibrillation.  They were given PO and IV analgesics for pain control following their surgery.  They were given 24 hours of postoperative antibiotics of  Anti-infectives    Start     Dose/Rate Route Frequency Ordered Stop   12/22/16 1800  vancomycin (VANCOCIN) IVPB 1000 mg/200 mL premix     1,000 mg 200 mL/hr over 60 Minutes Intravenous Every 12 hours 12/22/16 1240 12/22/16 1711   12/22/16 0859  polymyxin B 500,000 Units, bacitracin 50,000 Units in sodium chloride irrigation 0.9 % 500 mL irrigation  Status:  Discontinued       As needed 12/22/16 0859 12/22/16 1003   12/22/16 0600  vancomycin (VANCOCIN) 1,500 mg in sodium chloride 0.9 % 500 mL IVPB     1,500 mg 250 mL/hr over 120 Minutes Intravenous On call to O.R. 12/21/16 1318 12/22/16 0840     and started on DVT prophylaxis in the form of Xarelto.   PT and OT were ordered for total joint protocol.  Discharge planning consulted to help with postop disposition and equipment needs.  Patient had a fair night on the evening of surgery.  They started to get up OOB with therapy on day one but progressed slowly. Hemovac drain was pulled without difficulty.  Family requested  SNF placement after reporting that they would not be able to "take care of her at home". Continued to work with therapy into day two.  Dressing was changed on day two and the incision was clean and dry. Patient continued to move slowly with therapy and SNF arrangements were made despite original post operative plan that had been in place for her to go home with outpatient therapy and family assistance. Plan for DC to SNF post op day two.    Diet: Cardiac diet and Diabetic diet Activity:WBAT Follow-up:in 2 weeks Disposition - Skilled nursing facility Discharged Condition: stable   Discharge Instructions    Call MD / Call 911    Complete by:  As directed    If you experience chest pain or shortness of breath, CALL 911 and be transported to the hospital emergency room.  If you develope a fever above 101 F, pus (white drainage) or increased drainage or redness at the wound, or calf  pain, call your surgeon's office.   Constipation Prevention    Complete by:  As directed    Drink plenty of fluids.  Prune juice may be helpful.  You may use a stool softener, such as Colace (over the counter) 100 mg twice a day.  Use MiraLax (over the counter) for constipation as needed.   Diet - low sodium heart healthy    Complete by:  As directed    Diet Carb Modified    Complete by:  As directed    Discharge instructions    Complete by:  As directed    INSTRUCTIONS AFTER JOINT REPLACEMENT   Remove items at home which could result in a fall. This includes throw rugs or furniture in walking pathways ICE to the affected joint every three hours while awake for 30 minutes at a time, for at least the first 3-5 days, and then as needed for pain and swelling.  Continue to use ice for pain and swelling. You may notice swelling that will progress down to the foot and ankle.  This is normal after surgery.  Elevate your leg when you are not up walking on it.   Continue to use the breathing machine you got in the hospital  (incentive spirometer) which will help keep your temperature down.  It is common for your temperature to cycle up and down following surgery, especially at night when you are not up moving around and exerting yourself.  The breathing machine keeps your lungs expanded and your temperature down.   DIET:  As you were doing prior to hospitalization, we recommend a well-balanced diet.  DRESSING / WOUND CARE / SHOWERING  You may change your dressing every day with sterile gauze.  Please use good hand washing techniques before changing the dressing.  Do not use any lotions or creams on the incision until instructed by your surgeon.  ACTIVITY  Increase activity slowly as tolerated, but follow the weight bearing instructions below.   No driving for 6 weeks or until further direction given by your physician.  You cannot drive while taking narcotics.  No lifting or carrying greater than 10 lbs. until further directed by your surgeon. Avoid periods of inactivity such as sitting longer than an hour when not asleep. This helps prevent blood clots.  You may return to work once you are authorized by your doctor.     WEIGHT BEARING   Weight bearing as tolerated with assist device (walker, cane, etc) as directed, use it as long as suggested by your surgeon or therapist, typically at least 4-6 weeks.   EXERCISES  Results after joint replacement surgery are often greatly improved when you follow the exercise, range of motion and muscle strengthening exercises prescribed by your doctor. Safety measures are also important to protect the joint from further injury. Any time any of these exercises cause you to have increased pain or swelling, decrease what you are doing until you are comfortable again and then slowly increase them. If you have problems or questions, call your caregiver or physical therapist for advice.   Rehabilitation is important following a joint replacement. After just a few days of  immobilization, the muscles of the leg can become weakened and shrink (atrophy).  These exercises are designed to build up the tone and strength of the thigh and leg muscles and to improve motion. Often times heat used for twenty to thirty minutes before working out will loosen up your tissues and help with improving the range  of motion but do not use heat for the first two weeks following surgery (sometimes heat can increase post-operative swelling).   These exercises can be done on a training (exercise) mat, on the floor, on a table or on a bed. Use whatever works the best and is most comfortable for you.    Use music or television while you are exercising so that the exercises are a pleasant break in your day. This will make your life better with the exercises acting as a break in your routine that you can look forward to.   Perform all exercises about fifteen times, three times per day or as directed.  You should exercise both the operative leg and the other leg as well.   Exercises include:   Quad Sets - Tighten up the muscle on the front of the thigh (Quad) and hold for 5-10 seconds.   Straight Leg Raises - With your knee straight (if you were given a brace, keep it on), lift the leg to 60 degrees, hold for 3 seconds, and slowly lower the leg.  Perform this exercise against resistance later as your leg gets stronger.  Leg Slides: Lying on your back, slowly slide your foot toward your buttocks, bending your knee up off the floor (only go as far as is comfortable). Then slowly slide your foot back down until your leg is flat on the floor again.  Angel Wings: Lying on your back spread your legs to the side as far apart as you can without causing discomfort.  Hamstring Strength:  Lying on your back, push your heel against the floor with your leg straight by tightening up the muscles of your buttocks.  Repeat, but this time bend your knee to a comfortable angle, and push your heel against the floor.  You  may put a pillow under the heel to make it more comfortable if necessary.   A rehabilitation program following joint replacement surgery can speed recovery and prevent re-injury in the future due to weakened muscles. Contact your doctor or a physical therapist for more information on knee rehabilitation.    CONSTIPATION  Constipation is defined medically as fewer than three stools per week and severe constipation as less than one stool per week.  Even if you have a regular bowel pattern at home, your normal regimen is likely to be disrupted due to multiple reasons following surgery.  Combination of anesthesia, postoperative narcotics, change in appetite and fluid intake all can affect your bowels.   YOU MUST use at least one of the following options; they are listed in order of increasing strength to get the job done.  They are all available over the counter, and you may need to use some, POSSIBLY even all of these options:    Drink plenty of fluids (prune juice may be helpful) and high fiber foods Colace 100 mg by mouth twice a day  Senokot for constipation as directed and as needed Dulcolax (bisacodyl), take with full glass of water  Miralax (polyethylene glycol) once or twice a day as needed.  If you have tried all these things and are unable to have a bowel movement in the first 3-4 days after surgery call either your surgeon or your primary doctor.    If you experience loose stools or diarrhea, hold the medications until you stool forms back up.  If your symptoms do not get better within 1 week or if they get worse, check with your doctor.  If you experience "  the worst abdominal pain ever" or develop nausea or vomiting, please contact the office immediately for further recommendations for treatment.   ITCHING:  If you experience itching with your medications, try taking only a single pain pill, or even half a pain pill at a time.  You can also use Benadryl over the counter for itching or  also to help with sleep.   TED HOSE STOCKINGS:  Use stockings on both legs until for at least 2 weeks or as directed by physician office. They may be removed at night for sleeping.  MEDICATIONS:  See your medication summary on the "After Visit Summary" that nursing will review with you.  You may have some home medications which will be placed on hold until you complete the course of blood thinner medication.  It is important for you to complete the blood thinner medication as prescribed.  PRECAUTIONS:  If you experience chest pain or shortness of breath - call 911 immediately for transfer to the hospital emergency department.   If you develop a fever greater that 101 F, purulent drainage from wound, increased redness or drainage from wound, foul odor from the wound/dressing, or calf pain - CONTACT YOUR SURGEON.                                                   FOLLOW-UP APPOINTMENTS:  If you do not already have a post-op appointment, please call the office for an appointment to be seen by your surgeon.  Guidelines for how soon to be seen are listed in your "After Visit Summary", but are typically between 1-4 weeks after surgery.  MAKE SURE YOU:  Understand these instructions.  Get help right away if you are not doing well or get worse.    Thank you for letting us be a part of your medical care team.  It is a privilege we respect greatly.  We hope these instructions will help you stay on track for a fast and full recovery!   Increase activity slowly as tolerated    Complete by:  As directed      Allergies as of 12/24/2016      Reactions   Ace Inhibitors Cough   Iohexol     Desc: HIVES 40 YEARS AGO   Statins    myalgia   Iodine Rash   Penicillins Swelling, Rash   Has patient had a PCN reaction causing immediate rash, facial/tongue/throat swelling, SOB or lightheadedness with hypotension: Yes Has patient had a PCN reaction causing severe rash involving mucus membranes or skin necrosis:  No Has patient had a PCN reaction that required hospitalization Yes Has patient had a PCN reaction occurring within the last 10 years: No If all of the above answers are "NO", then may proceed with Cephalosporin use.   Sulfa Antibiotics Rash      Medication List    STOP taking these medications   acetaminophen 500 MG tablet Commonly known as:  TYLENOL     TAKE these medications   accu-chek soft touch lancets Test BS 1 x daily and PRN DX. E11.9   aspirin EC 325 MG tablet Take 1 tablet (325 mg total) by mouth 2 (two) times daily. What changed:  when to take this   furosemide 20 MG tablet Commonly known as:  LASIX Take 1 tablet (20 mg total) by mouth  daily. What changed:  when to take this  reasons to take this   gabapentin 100 MG capsule Commonly known as:  NEURONTIN Take 300 mg by mouth at bedtime.   glucose blood test strip Commonly known as:  ACCU-CHEK AVIVA PLUS Test 1x per day and prn. DX.E11.9   levothyroxine 175 MCG tablet Commonly known as:  SYNTHROID, LEVOTHROID Take 1 tablet (175 mcg total) by mouth daily before breakfast.   losartan 50 MG tablet Commonly known as:  COZAAR Take 1 tablet (50 mg total) by mouth at bedtime.   metFORMIN 1000 MG tablet Commonly known as:  GLUCOPHAGE Take 1 tablet (1,000 mg total) by mouth 2 (two) times daily with a meal.   methocarbamol 500 MG tablet Commonly known as:  ROBAXIN Take 1 tablet (500 mg total) by mouth every 6 (six) hours as needed for muscle spasms.   metoprolol succinate 25 MG 24 hr tablet Commonly known as:  TOPROL XL Take 1 tablet (25 mg total) by mouth daily.   multivitamin with minerals Tabs tablet Take 1 tablet by mouth daily.   oxyCODONE-acetaminophen 5-325 MG tablet Commonly known as:  PERCOCET/ROXICET Take 1-2 tablets by mouth every 4 (four) hours as needed for moderate pain.   triamcinolone cream 0.5 % Commonly known as:  KENALOG Apply 1 application topically 3 (three) times daily. What  changed:  when to take this  reasons to take this      Follow-up Information    GIOFFRE,RONALD A, MD. Schedule an appointment as soon as possible for a visit in 2 week(s).   Specialty:  Orthopedic Surgery Contact information: [address] [place] [address] [phone]           Signed: Ardeen Jourdain, PA-C Orthopaedic Surgery 12/24/2016, 7:21 AM

## 2016-12-24 NOTE — Progress Notes (Signed)
Physical Therapy Treatment Patient Details Name: Debra Campbell MRN: [id number] DOB: 08/05/1932 Today's Date: 12/24/2016    History of Present Illness 83 yo female s/p L TKA 12/22/16. Hx of DM, Afib, neuropathy, morbid obesity.     PT Comments    Progressing slowly with mobility. Performed exercises and gait training. Plan is for pt to d/c to SNF later today.   Follow Up Recommendations  SNF     Equipment Recommendations  None recommended by PT    Recommendations for Other Services       Precautions / Restrictions Precautions Precautions: Fall Required Braces or Orthoses: Knee Immobilizer - Left Knee Immobilizer - Left: Discontinue once straight leg raise with < 10 degree lag Restrictions Weight Bearing Restrictions: No LLE Weight Bearing: Weight bearing as tolerated    Mobility  Bed Mobility Overal bed mobility: Needs Assistance Bed Mobility: Supine to Sit     Supine to sit: Min assist;HOB elevated     General bed mobility comments: Assist for LLE. Increased time. Pt used bedrail. VCs safety, technique.   Transfers Overall transfer level: Needs assistance Equipment used: Rolling walker (2 wheeled) Transfers: Sit to/from [facility] Sit to Stand: Mod assist Stand pivot transfers: Min assist       General transfer comment: Assist to rise, stabilize, control descent. VCs safety, technique, hand/LE placement. Increased time. Stand pivot, bed to bsc, with RW.   Ambulation/Gait Ambulation/Gait assistance: Min assist Ambulation Distance (Feet): 23 Feet Assistive device: Rolling walker (2 wheeled) Gait Pattern/deviations: Step-to pattern;Antalgic     General Gait Details: Assist to stabilize pt and maneuver with RW. VCs safety, technique, sequence, proper use of RW. Pt fatigues quickly.    Stairs            Wheelchair Mobility    Modified Rankin (Stroke Patients Only)       Balance                                     Cognition Arousal/Alertness: Awake/alert Behavior During Therapy: WFL for tasks assessed/performed Overall Cognitive Status: Within Functional Limits for tasks assessed                      Exercises Total Joint Exercises Ankle Circles/Pumps: AROM;Both;10 reps;Supine Quad Sets: AROM;Both;10 reps;Supine Heel Slides: AAROM;Left;10 reps;Supine Hip ABduction/ADduction: AAROM;Left;10 reps;Supine Straight Leg Raises: AAROM;Left;10 reps;Supine Goniometric ROM: ~5-65 degrees    General Comments        Pertinent Vitals/Pain Pain Assessment: 0-10 Pain Score: 7  Pain Location: L knee with activity Pain Descriptors / Indicators: Aching;Sore Pain Intervention(s): Monitored during session;Ice applied;Limited activity within patient's tolerance;Repositioned    Home Living                      Prior Function            PT Goals (current goals can now be found in the care plan section) Progress towards PT goals: Progressing toward goals    Frequency    7X/week      PT Plan Current plan remains appropriate    Co-evaluation             End of Session Equipment Utilized During Treatment: Gait belt;Left knee immobilizer Activity Tolerance: Patient tolerated treatment well Patient left: in chair;with call bell/phone within reach;with family/visitor present     Time: [id number] PT Time Calculation (min) (ACUTE  ONLY): 24 min  Charges:  $Gait Training: 8-22 mins $Therapeutic Exercise: 8-22 mins                    G Codes:      Debra Campbell, MPT Pager: [phone]

## 2016-12-24 NOTE — Clinical Social Work Placement (Signed)
Patient is set to discharge to [facility] today. Patient & husband at bedside made aware. Discharge packet given to RN, Sophia.     Raynaldo Opitz, [facility] Clinical Social Worker cell #: [phone]    CLINICAL SOCIAL WORK PLACEMENT  NOTE  Date:  12/24/2016  Patient Details  Name: Debra Campbell MRN: [id number] Date of Birth: 1932/09/01  Clinical Social Work is seeking post-discharge placement for this patient at the [place] level of care (*CSW will initial, date and re-position this form in  chart as items are completed):  Yes   Patient/family provided with [facility]'s list of facilities offering this level of care within the geographic area requested by the patient (or if unable, by the patient's family).  Yes   Patient/family informed of their freedom to choose among providers that offer the needed level of care, that participate in Medicare, Medicaid or managed care program needed by the patient, have an available bed and are willing to accept the patient.  Yes   Patient/family informed of [facility]'s ownership interest in [facility] and [facility], as well as of the fact that they are under no obligation to receive care at these facilities.  PASRR submitted to EDS on       PASRR number received on       Existing PASRR number confirmed on 12/23/16     FL2 transmitted to all facilities in geographic area requested by pt/family on 12/23/16     FL2 transmitted to all facilities within larger geographic area on       Patient informed that his/her managed care company has contracts with or will negotiate with certain facilities, including the following:        Yes   Patient/family informed of bed offers received.  Patient chooses bed at [facility]     Physician recommends and patient chooses bed at      Patient to be transferred to [facility] on 12/24/16.  Patient to be transferred to facility by Patient's husband to transport     Patient family notified on 12/24/16 of transfer.  Name of family member notified:  patient's husband at bedside     PHYSICIAN       Additional Comment:    _______________________________________________ Standley Brooking, LCSW 12/24/2016, 12:36 PM

## 2016-12-24 NOTE — Progress Notes (Signed)
Subjective: 2 Days Post-Op Procedure(s) (LRB): LEFT TOTAL KNEE ARTHROPLASTY (Left) Patient reports pain as 2 on 0-10 scale.Doing much better today. Will DC to SNF today. PT evaluated her and feels that it is unsafe for her to go home.    Objective: Vital signs in last 24 hours: Temp:  [98.2 F (36.8 C)-99.2 F (37.3 C)] 98.2 F (36.8 C) (01/26 0446) Pulse Rate:  [85-110] 85 (01/26 0446) Resp:  [18-20] 18 (01/26 0446) BP: (112-129)/(65-71) 113/70 (01/26 0446) SpO2:  [92 %-98 %] 96 % (01/26 0446)  Intake/Output from previous day: 01/25 0701 - 01/26 0700 In: 1100 [I.V.:1100] Out: 650 [Urine:650] Intake/Output this shift: No intake/output data recorded.   Recent Labs  12/23/16 0523 12/24/16 0528  HGB 10.6* 10.8*    Recent Labs  12/23/16 0523 12/24/16 0528  WBC 8.2 8.1  RBC 3.51* 3.62*  HCT 32.4* 33.5*  PLT 149* 130*    Recent Labs  12/23/16 0523 12/24/16 0528  NA 134* 135  K 4.5 4.4  CL 102 100*  CO2 24 29  BUN 16 15  CREATININE 0.82 0.94  GLUCOSE 169* 159*  CALCIUM 8.5* 8.8*   No results for input(s): LABPT, INR in the last 72 hours.  Neurologically intact Dorsiflexion/Plantar flexion intact  Assessment/Plan: 2 Days Post-Op Procedure(s) (LRB): LEFT TOTAL KNEE ARTHROPLASTY (Left) Up with therapy Discharge to SNF  Milda Lindvall A 12/24/2016, 7:35 AM

## 2016-12-24 NOTE — Evaluation (Signed)
Occupational Therapy Evaluation Patient Details Name: Debra Campbell MRN: [id number] DOB: 1932/01/13 Today's Date: 12/24/2016    History of Present Illness 83 yo female s/p L TKA 12/22/16. Hx of DM, Afib, neuropathy, morbid obesity.    Clinical Impression   Pt is s/p TKA resulting in the deficits listed below (see OT Problem List).  Pt will benefit from skilled OT to increase their safety and independence with ADL and functional mobility for ADL to facilitate discharge to venue listed below.        Follow Up Recommendations  SNF    Equipment Recommendations  None recommended by OT       Precautions / Restrictions Precautions Precautions: Fall Required Braces or Orthoses: Knee Immobilizer - Left Knee Immobilizer - Left: Discontinue once straight leg raise with < 10 degree lag Restrictions Weight Bearing Restrictions: No LLE Weight Bearing: Weight bearing as tolerated      Mobility Bed Mobility Overal bed mobility: Needs Assistance Bed Mobility: Supine to Sit     Supine to sit: Min assist;HOB elevated     General bed mobility comments: increased time and VC  Transfers Overall transfer level: Needs assistance Equipment used: Rolling walker (2 wheeled) Transfers: Sit to/from [facility] Sit to Stand: Min assist Stand pivot transfers: Min assist       General transfer comment: Assist to rise, stabilize, control descent. VCs safety, technique, hand/LE placement. Increased time. Stand pivot, bed to bsc, with RW.          ADL Overall ADL's : Needs assistance/impaired Eating/Feeding: Set up;Sitting   Grooming: Set up;Sitting   Upper Body Bathing: Set up;Sitting   Lower Body Bathing: Maximal assistance;Sit to/from stand;Cueing for safety;Cueing for sequencing;Cueing for compensatory techniques   Upper Body Dressing : Set up;Sitting   Lower Body Dressing: Maximal assistance;Sit to/from stand;Cueing for safety;Cueing for sequencing   Toilet  Transfer: Moderate assistance;BSC;RW;Ambulation;Cueing for sequencing;Cueing for safety   Toileting- Clothing Manipulation and Hygiene: Moderate assistance;Sit to/from stand;Cueing for sequencing         General ADL Comments: plan is now ST SNF for rehab               Pertinent Vitals/Pain Pain Assessment: 0-10 Pain Score: 7  Pain Location: L knee Pain Descriptors / Indicators: Aching;Sore Pain Intervention(s): Monitored during session;Repositioned        Extremity/Trunk Assessment Upper Extremity Assessment Upper Extremity Assessment: Generalized weakness           Communication Communication Communication: No difficulties   Cognition Arousal/Alertness: Awake/alert Behavior During Therapy: WFL for tasks assessed/performed Overall Cognitive Status: Within Functional Limits for tasks assessed                                Home Living Family/patient expects to be discharged to:: Private residence Living Arrangements: Spouse/significant other Available Help at Discharge: Family Type of Home: House Home Access: Stairs to enter Technical brewer of Steps: 3 Entrance Stairs-Rails: None Home Layout: One level     Bathroom Shower/Tub: Tub/shower unit;Walk-in shower   Bathroom Toilet: Standard     Home Equipment: Environmental consultant - 2 wheels;Cane - single point;Tub bench;Bedside commode;Wheelchair - manual                   OT Problem List: Decreased strength;Decreased activity tolerance   OT Treatment/Interventions: Self-care/ADL training;Patient/family education;DME and/or AE instruction    OT Goals(Current goals can be found in the care  plan section) Acute Rehab OT Goals Patient Stated Goal: to go to rehab (MD plans for d/c home however)  OT Frequency: Min 2X/week              End of Session Equipment Utilized During Treatment: Rolling walker Nurse Communication: Mobility status  Activity Tolerance: Patient tolerated treatment  well Patient left: in bed;with call bell/phone within reach;with family/visitor present;with bed alarm set   Time: [id number] OT Time Calculation (min): 24 min Charges:  OT General Charges $OT Visit: 1 Procedure OT Evaluation $OT Eval Moderate Complexity: 1 Procedure OT Treatments $Self Care/Home Management : 8-22 mins G-Codes:    Payton Mccallum D 01/11/17, 11:44 AM

## 2016-12-27 ENCOUNTER — Ambulatory Visit: Payer: Medicare Other | Admitting: Physical Therapy

## 2016-12-27 DIAGNOSIS — I10 Essential (primary) hypertension: Secondary | ICD-10-CM | POA: Diagnosis not present

## 2016-12-27 DIAGNOSIS — M62838 Other muscle spasm: Secondary | ICD-10-CM | POA: Diagnosis not present

## 2016-12-27 DIAGNOSIS — I4891 Unspecified atrial fibrillation: Secondary | ICD-10-CM | POA: Diagnosis not present

## 2016-12-27 DIAGNOSIS — E785 Hyperlipidemia, unspecified: Secondary | ICD-10-CM | POA: Diagnosis not present

## 2016-12-27 DIAGNOSIS — E1165 Type 2 diabetes mellitus with hyperglycemia: Secondary | ICD-10-CM | POA: Diagnosis not present

## 2016-12-27 DIAGNOSIS — M170 Bilateral primary osteoarthritis of knee: Secondary | ICD-10-CM | POA: Diagnosis not present

## 2016-12-27 DIAGNOSIS — Z96651 Presence of right artificial knee joint: Secondary | ICD-10-CM | POA: Diagnosis not present

## 2016-12-27 DIAGNOSIS — E039 Hypothyroidism, unspecified: Secondary | ICD-10-CM | POA: Diagnosis not present

## 2016-12-27 DIAGNOSIS — C541 Malignant neoplasm of endometrium: Secondary | ICD-10-CM | POA: Diagnosis not present

## 2016-12-27 DIAGNOSIS — E1140 Type 2 diabetes mellitus with diabetic neuropathy, unspecified: Secondary | ICD-10-CM | POA: Diagnosis not present

## 2016-12-27 NOTE — Anesthesia Postprocedure Evaluation (Addendum)
Anesthesia Post Note  Patient: Debra Campbell  Procedure(s) Performed: Procedure(s) (LRB): LEFT TOTAL KNEE ARTHROPLASTY (Left)  Patient location during evaluation: PACU Anesthesia Type: General Level of consciousness: awake, awake and alert and oriented Pain management: pain level controlled Vital Signs Assessment: post-procedure vital signs reviewed and stable Respiratory status: spontaneous breathing and nonlabored ventilation Cardiovascular status: blood pressure returned to baseline Anesthetic complications: no       Last Vitals:  Vitals:   12/23/16 2053 12/24/16 0446  BP: 129/65 113/70  Pulse: (!) 105 85  Resp: 18 18  Temp: 37.3 C 36.8 C    Last Pain:  Vitals:   12/24/16 1130  TempSrc:   PainSc: 7                  Kennith Morss COKER

## 2016-12-28 DIAGNOSIS — Z96651 Presence of right artificial knee joint: Secondary | ICD-10-CM | POA: Diagnosis not present

## 2016-12-28 DIAGNOSIS — R609 Edema, unspecified: Secondary | ICD-10-CM | POA: Diagnosis not present

## 2016-12-28 DIAGNOSIS — E1140 Type 2 diabetes mellitus with diabetic neuropathy, unspecified: Secondary | ICD-10-CM | POA: Diagnosis not present

## 2016-12-28 DIAGNOSIS — I4891 Unspecified atrial fibrillation: Secondary | ICD-10-CM | POA: Diagnosis not present

## 2016-12-28 DIAGNOSIS — M6281 Muscle weakness (generalized): Secondary | ICD-10-CM | POA: Diagnosis not present

## 2016-12-28 DIAGNOSIS — E1165 Type 2 diabetes mellitus with hyperglycemia: Secondary | ICD-10-CM | POA: Diagnosis not present

## 2016-12-28 DIAGNOSIS — M62838 Other muscle spasm: Secondary | ICD-10-CM | POA: Diagnosis not present

## 2016-12-28 DIAGNOSIS — I10 Essential (primary) hypertension: Secondary | ICD-10-CM | POA: Diagnosis not present

## 2016-12-28 DIAGNOSIS — E039 Hypothyroidism, unspecified: Secondary | ICD-10-CM | POA: Diagnosis not present

## 2016-12-28 DIAGNOSIS — D649 Anemia, unspecified: Secondary | ICD-10-CM | POA: Diagnosis not present

## 2016-12-29 DIAGNOSIS — E1165 Type 2 diabetes mellitus with hyperglycemia: Secondary | ICD-10-CM | POA: Diagnosis not present

## 2016-12-29 DIAGNOSIS — I10 Essential (primary) hypertension: Secondary | ICD-10-CM | POA: Diagnosis not present

## 2016-12-29 DIAGNOSIS — I4891 Unspecified atrial fibrillation: Secondary | ICD-10-CM | POA: Diagnosis not present

## 2016-12-29 DIAGNOSIS — E1140 Type 2 diabetes mellitus with diabetic neuropathy, unspecified: Secondary | ICD-10-CM | POA: Diagnosis not present

## 2016-12-29 DIAGNOSIS — Z96651 Presence of right artificial knee joint: Secondary | ICD-10-CM | POA: Diagnosis not present

## 2016-12-29 DIAGNOSIS — R609 Edema, unspecified: Secondary | ICD-10-CM | POA: Diagnosis not present

## 2016-12-29 DIAGNOSIS — M170 Bilateral primary osteoarthritis of knee: Secondary | ICD-10-CM | POA: Diagnosis not present

## 2016-12-29 DIAGNOSIS — D649 Anemia, unspecified: Secondary | ICD-10-CM | POA: Diagnosis not present

## 2016-12-29 DIAGNOSIS — M6281 Muscle weakness (generalized): Secondary | ICD-10-CM | POA: Diagnosis not present

## 2016-12-29 DIAGNOSIS — C541 Malignant neoplasm of endometrium: Secondary | ICD-10-CM | POA: Diagnosis not present

## 2016-12-29 DIAGNOSIS — E039 Hypothyroidism, unspecified: Secondary | ICD-10-CM | POA: Diagnosis not present

## 2017-01-03 ENCOUNTER — Ambulatory Visit: Payer: Medicare Other | Admitting: Physical Therapy

## 2017-01-03 ENCOUNTER — Ambulatory Visit: Payer: Medicare Other | Attending: Orthopedic Surgery | Admitting: Physical Therapy

## 2017-01-03 DIAGNOSIS — M25662 Stiffness of left knee, not elsewhere classified: Secondary | ICD-10-CM | POA: Diagnosis not present

## 2017-01-03 DIAGNOSIS — M25562 Pain in left knee: Secondary | ICD-10-CM | POA: Diagnosis not present

## 2017-01-03 DIAGNOSIS — R600 Localized edema: Secondary | ICD-10-CM | POA: Insufficient documentation

## 2017-01-03 DIAGNOSIS — G8929 Other chronic pain: Secondary | ICD-10-CM | POA: Diagnosis not present

## 2017-01-03 NOTE — Therapy (Signed)
[facility] [place], [place], [address] Phone: [phone]   Fax:  [phone]  Physical Therapy Evaluation  Patient Details  Name: Debra Campbell MRN: [id number] Date of Birth: 06-21-1932 Referring Provider: Latanya Maudlin MD.  Encounter Date: 01/03/2017      PT End of Session - 01/03/17 1504    PT Start Time 0148   PT Stop Time 0238   PT Time Calculation (min) 50 min   Activity Tolerance Patient tolerated treatment well   Behavior During Therapy [facility] for tasks assessed/performed      Past Medical History:  Diagnosis Date  . Arthritis    legs, lower back, hands  . Cancer ([place])    endometrial  . Diabetes mellitus   . Ectopic pregnancy   . Eczema   . Headache(784.0)    hx of  . History of kidney stones   . Hyperlipidemia    no meds  . Hypertension   . Hypothyroidism   . Persistent atrial fibrillation ([place]) 09/28/2016   Relatively new Dx: Asymptomatic.  Rate controlled without medication.  . SVD (spontaneous vaginal delivery)    x 3  . Thyroid disease     Past Surgical History:  Procedure Laterality Date  . ABDOMINAL HYSTERECTOMY Bilateral 06/26/2013   Procedure: TOTAL ABDOMINAL HYSTERECTOMY WITH BILATERAL SALPINGO OOPHERECTOMY WITH PELVIC LYMPHADNECTOMY;  Surgeon: Alvino Chapel, MD;  Location: [facility];  Service: Gynecology;  Laterality: Bilateral;  . BACK SURGERY  06/26/2013  . ECTOPIC PREGNANCY SURGERY    . HERNIA REPAIR    . HYSTEROSCOPY W/D&C N/A 04/26/2013   Procedure: DILATATION AND CURETTAGE /HYSTEROSCOPY;  Surgeon: Woodroe Mode, MD;  Location: [facility];  Service: Gynecology;  Laterality: N/A;  . JOINT REPLACEMENT    . LAPAROTOMY     for ectopic pregnancy  . LAPAROTOMY N/A 06/26/2013   Procedure: EXPLORATORY LAPAROTOMY;  Surgeon: Alvino Chapel, MD;  Location: [facility];  Service: Gynecology;  Laterality: N/A;  . lt knee arthroscopy    . rt total hip replacement    . TONSILLECTOMY     . TOTAL KNEE ARTHROPLASTY Left 12/22/2016   Procedure: LEFT TOTAL KNEE ARTHROPLASTY;  Surgeon: Latanya Maudlin, MD;  Location: [facility];  Service: Orthopedics;  Laterality: Left;  . WISDOM TOOTH EXTRACTION      There were no vitals filed for this visit.       Subjective Assessment - 01/03/17 1422    Subjective The patient underwent a left total knee replacement on 12/22/16.  She reports she was sick after surgery and really had no therapy and then went to a SNF and had no therapy.  She has a neice that is well versed with knee exercises and started her on ankle pumps, SLR;'s, heel sildes and seated knee flexion and extension.  Her pain-level is a high 9/10 today.  Pain increasing with movement and decreasing at rest.  She is complant to wearing TED hose and is using a FWW.   Patient Stated Goals Get out of pain and walk good.   Pain Score 9    Pain Location Knee   Pain Orientation Left   Pain Descriptors / Indicators Sore;Aching   Pain Type Surgical pain            [facility] PT Assessment - 01/03/17 0001      Assessment   Medical Diagnosis Left total knee replacement.   Referring Provider Latanya Maudlin MD.   Onset Date/Surgical Date --  12/22/16 (surgery  date).     Precautions   Precautions --  No ultrasound.     Restrictions   Weight Bearing Restrictions No     Balance Screen   Has the patient fallen in the past 6 months No   Has the patient had a decrease in activity level because of a fear of falling?  No   Is the patient reluctant to leave their home because of a fear of falling?  No     Home Ecologist residence     Prior Function   Level of Independence Independent     Observation/Other Assessments-Edema    Edema Circumferential     Circumferential Edema   Circumferential - Right LT 6 cms > RT.     ROM / Strength   AROM / PROM / Strength AROM;Strength     AROM   Overall AROM Comments -10 degrees to 88 degrees.     Strength    Overall Strength Comments Left hip= 3/5 and left knee ~4/5.     Palpation   Palpation comment Diffuse c/o left knee pain.     Ambulation/Gait   Gait Comments Antalgic gait pattern with a FWW.                   [facility] Adult PT Treatment/Exercise - 01/03/17 0001      Modalities   Modalities Electrical Stimulation;Vasopneumatic     Electrical Stimulation   Electrical Stimulation Location Left knee.   Electrical Stimulation Action IFC at 1-10 Hz x 15 minutes.   Electrical Stimulation Goals Edema;Pain     Vasopneumatic   Number Minutes Vasopneumatic  --  15 minutes.   Vasopnuematic Location  --  Left knee.   Vasopneumatic Pressure Medium                  PT Short Term Goals - 01/03/17 1451      PT SHORT TERM GOAL #1   Title Independent with an initial HEP.   Time 2   Period Weeks   Status New     PT SHORT TERM GOAL #2   Title Full active left knee extension in order to normalize gait   Time 2   Period Weeks   Status New           PT Long Term Goals - 01/03/17 1500      PT LONG TERM GOAL #1   Title Independewnt with an advanced HEP.   Time 8   Period Weeks   Status New     PT LONG TERM GOAL #2   Title Active left knee flexion to 115 degrees+ so the patient can perform functional tasks and do so with pain not > 2-3/10.   Time 8   Period Weeks   Status New     PT LONG TERM GOAL #3   Title Increase left knee strength to a solid 4+/5 to provide good stability for accomplishment of functional activities   Time 8   Period Weeks   Status New     PT LONG TERM GOAL #4   Title Decrease edema to within 2.5 cms of non-affected side to assist with pain reduction and range of motion gains.   Time 8   Period Weeks   Status New     PT LONG TERM GOAL #5   Title Perform a reciprocating stair gait with one railing with pain not > 2-3/10.   Time 8   Period  Weeks   Status New               Plan - 01/03/17 1449    Clinical Impression  Statement The patient presents with a significant amount of left knee edema and has range of motion deficits into both flexion and extension and associated strength losses of her left LE.  She is currently using a FWW for safety.   Rehab Potential Good   PT Frequency 3x / week   PT Duration 4 weeks   PT Treatment/Interventions ADLs/Self Care Home Management;Cryotherapy;Electrical Stimulation;Stair training;Functional mobility training;Gait training;Patient/family education;Neuromuscular re-education;Therapeutic exercise;Therapeutic activities;Manual techniques;Passive range of motion;Vasopneumatic Device   PT Next Visit Plan TKA protocol.  Electrical stimulation and vasopneumatic.   Consulted and Agree with Plan of Care Patient      Patient will benefit from skilled therapeutic intervention in order to improve the following deficits and impairments:  Abnormal gait, Decreased activity tolerance, Decreased mobility, Decreased strength, Decreased range of motion, Difficulty walking, Increased edema, Pain  Visit Diagnosis: Chronic pain of left knee - Plan: PT plan of care cert/re-cert  Stiffness of left knee, not elsewhere classified - Plan: PT plan of care cert/re-cert  Localized edema - Plan: PT plan of care cert/re-cert      G-Codes - [id number]    Functional Assessment Tool Used FOTO...99% limitation.   Functional Limitation Mobility: Walking and moving around   Mobility: Walking and Moving Around Current Status [phone]) At least 80 percent but less than 100 percent impaired, limited or restricted   Mobility: Walking and Moving Around Goal Status [phone]) At least 20 percent but less than 40 percent impaired, limited or restricted       Problem List Patient Active Problem List   Diagnosis Date Noted  . Status post total left knee replacement 12/22/2016  . S/P TKR (total knee replacement) 12/22/2016  . Preoperative cardiovascular examination 10/14/2016  . New onset a-fib ([place])  10/14/2016  . Persistent atrial fibrillation ([place]) 09/28/2016  . Morbid obesity ([place]) 08/18/2015  . Type 2 diabetes mellitus with diabetic polyneuropathy ([place]) 05/01/2015  . Endometrial cancer ([place]) 05/08/2013  . Essential hypertension 03/18/2011  . Hypothyroidism 03/18/2011  . Osteoarthritis 03/18/2011  . Osteopenia 03/18/2011  . Hyperlipidemia 03/18/2011  . Diabetic neuropathy ([place]) 03/18/2011    Conan Mcmanaway, [place] MPT 01/03/2017, 3:16 PM  [facility] [address] [place], [place], [address] Phone: [phone]   Fax:  [phone]  Name: Debra Campbell MRN: [id number] Date of Birth: May 15, 1932

## 2017-01-04 ENCOUNTER — Ambulatory Visit: Payer: Medicare Other | Admitting: Nurse Practitioner

## 2017-01-05 ENCOUNTER — Ambulatory Visit: Payer: Medicare Other | Admitting: Physical Therapy

## 2017-01-05 DIAGNOSIS — M25562 Pain in left knee: Principal | ICD-10-CM

## 2017-01-05 DIAGNOSIS — M25662 Stiffness of left knee, not elsewhere classified: Secondary | ICD-10-CM

## 2017-01-05 DIAGNOSIS — R600 Localized edema: Secondary | ICD-10-CM | POA: Diagnosis not present

## 2017-01-05 DIAGNOSIS — G8929 Other chronic pain: Secondary | ICD-10-CM

## 2017-01-05 NOTE — Therapy (Signed)
[facility] [place], [place], [address] Phone: [phone]   Fax:  [phone]  Physical Therapy Treatment  Patient Details  Name: Debra Campbell MRN: [id number] Date of Birth: May 19, 1932 Referring Provider: Latanya Maudlin MD.  Encounter Date: 01/05/2017      PT End of Session - 01/05/17 1210    Visit Number 2   Number of Visits 12   Date for PT Re-Evaluation 03/04/17   PT Start Time 1115   PT Stop Time 1214   PT Time Calculation (min) 59 min   Activity Tolerance Patient tolerated treatment well   Behavior During Therapy [facility] for tasks assessed/performed      Past Medical History:  Diagnosis Date  . Arthritis    legs, lower back, hands  . Cancer ([place])    endometrial  . Diabetes mellitus   . Ectopic pregnancy   . Eczema   . Headache(784.0)    hx of  . History of kidney stones   . Hyperlipidemia    no meds  . Hypertension   . Hypothyroidism   . Persistent atrial fibrillation ([place]) 09/28/2016   Relatively new Dx: Asymptomatic.  Rate controlled without medication.  . SVD (spontaneous vaginal delivery)    x 3  . Thyroid disease     Past Surgical History:  Procedure Laterality Date  . ABDOMINAL HYSTERECTOMY Bilateral 06/26/2013   Procedure: TOTAL ABDOMINAL HYSTERECTOMY WITH BILATERAL SALPINGO OOPHERECTOMY WITH PELVIC LYMPHADNECTOMY;  Surgeon: Alvino Chapel, MD;  Location: [facility];  Service: Gynecology;  Laterality: Bilateral;  . BACK SURGERY  06/26/2013  . ECTOPIC PREGNANCY SURGERY    . HERNIA REPAIR    . HYSTEROSCOPY W/D&C N/A 04/26/2013   Procedure: DILATATION AND CURETTAGE /HYSTEROSCOPY;  Surgeon: Woodroe Mode, MD;  Location: [facility];  Service: Gynecology;  Laterality: N/A;  . JOINT REPLACEMENT    . LAPAROTOMY     for ectopic pregnancy  . LAPAROTOMY N/A 06/26/2013   Procedure: EXPLORATORY LAPAROTOMY;  Surgeon: Alvino Chapel, MD;  Location: [facility];  Service: Gynecology;  Laterality:  N/A;  . lt knee arthroscopy    . rt total hip replacement    . TONSILLECTOMY    . TOTAL KNEE ARTHROPLASTY Left 12/22/2016   Procedure: LEFT TOTAL KNEE ARTHROPLASTY;  Surgeon: Latanya Maudlin, MD;  Location: [facility];  Service: Orthopedics;  Laterality: Left;  . WISDOM TOOTH EXTRACTION      There were no vitals filed for this visit.      Subjective Assessment - 01/05/17 1201    Subjective patient states she is doing great today.  She has been compliant to her HEP with assistance from her niece.                         [facility] Adult PT Treatment/Exercise - 01/05/17 0001      Exercises   Exercises Knee/Hip     Knee/Hip Exercises: Aerobic   Nustep level 1 x 15 minutes moving forward x 3 to increase knee flexion.     Modalities   Modalities Location manager Stimulation Location Left knee.   Electrical Stimulation Action IFC    Electrical Stimulation Parameters 1-10 Hz x 20 minutes.   Electrical Stimulation Goals Edema;Pain     Vasopneumatic   Number Minutes Vasopneumatic  20 minutes   Vasopnuematic Location  --  Left knee.   Vasopneumatic Pressure Medium  Manual Therapy   Manual therapy comments In supine left patellar mobs in all direction f/b passive stretching into flexion and extension x 8 minutes.                  PT Short Term Goals - 01/03/17 1451      PT SHORT TERM GOAL #1   Title Independent with an initial HEP.   Time 2   Period Weeks   Status New     PT SHORT TERM GOAL #2   Title Full active left knee extension in order to normalize gait   Time 2   Period Weeks   Status New           PT Long Term Goals - 01/03/17 1500      PT LONG TERM GOAL #1   Title Independewnt with an advanced HEP.   Time 8   Period Weeks   Status New     PT LONG TERM GOAL #2   Title Active left knee flexion to 115 degrees+ so the patient can perform functional tasks and do so with  pain not > 2-3/10.   Time 8   Period Weeks   Status New     PT LONG TERM GOAL #3   Title Increase left knee strength to a solid 4+/5 to provide good stability for accomplishment of functional activities   Time 8   Period Weeks   Status New     PT LONG TERM GOAL #4   Title Decrease edema to within 2.5 cms of non-affected side to assist with pain reduction and range of motion gains.   Time 8   Period Weeks   Status New     PT LONG TERM GOAL #5   Title Perform a reciprocating stair gait with one railing with pain not > 2-3/10.   Time 8   Period Weeks   Status New               Plan - 01/05/17 1210    PT Treatment/Interventions ADLs/Self Care Home Management;Cryotherapy;Electrical Stimulation;Stair training;Functional mobility training;Gait training;Patient/family education;Neuromuscular re-education;Therapeutic exercise;Therapeutic activities;Manual techniques;Passive range of motion;Vasopneumatic Device   PT Next Visit Plan TKA protocol.  Electrical stimulation and vasopneumatic.      Patient will benefit from skilled therapeutic intervention in order to improve the following deficits and impairments:  Abnormal gait, Decreased activity tolerance, Decreased mobility, Decreased strength, Decreased range of motion, Difficulty walking, Increased edema, Pain  Visit Diagnosis: Chronic pain of left knee  Stiffness of left knee, not elsewhere classified  Localized edema     Problem List Patient Active Problem List   Diagnosis Date Noted  . Status post total left knee replacement 12/22/2016  . S/P TKR (total knee replacement) 12/22/2016  . Preoperative cardiovascular examination 10/14/2016  . New onset a-fib ([place]) 10/14/2016  . Persistent atrial fibrillation ([place]) 09/28/2016  . Morbid obesity ([place]) 08/18/2015  . Type 2 diabetes mellitus with diabetic polyneuropathy ([place]) 05/01/2015  . Endometrial cancer ([place]) 05/08/2013  . Essential hypertension 03/18/2011  .  Hypothyroidism 03/18/2011  . Osteoarthritis 03/18/2011  . Osteopenia 03/18/2011  . Hyperlipidemia 03/18/2011  . Diabetic neuropathy ([place]) 03/18/2011    Teruo Stilley, [place] MPT 01/05/2017, 12:24 PM  [facility] [address] [place], [place], [address] Phone: [phone]   Fax:  [phone]  Name: DONAE SHILEY MRN: [id number] Date of Birth: [birth year]

## 2017-01-06 ENCOUNTER — Ambulatory Visit: Payer: Medicare Other | Admitting: Physical Therapy

## 2017-01-06 DIAGNOSIS — R600 Localized edema: Secondary | ICD-10-CM

## 2017-01-06 DIAGNOSIS — M25562 Pain in left knee: Principal | ICD-10-CM

## 2017-01-06 DIAGNOSIS — G8929 Other chronic pain: Secondary | ICD-10-CM | POA: Diagnosis not present

## 2017-01-06 DIAGNOSIS — M25662 Stiffness of left knee, not elsewhere classified: Secondary | ICD-10-CM | POA: Diagnosis not present

## 2017-01-06 NOTE — Therapy (Signed)
[facility] [place], [place], [address] Phone: [phone]   Fax:  [phone]  Physical Therapy Treatment  Patient Details  Name: Debra Campbell MRN: [id number] Date of Birth: May 16, 1932 Referring Provider: Latanya Maudlin MD.  Encounter Date: 01/06/2017      PT End of Session - 01/06/17 1230    Visit Number 3   Number of Visits 12   Date for PT Re-Evaluation 03/04/17   PT Start Time 1115   PT Stop Time 1215   PT Time Calculation (min) 60 min   Equipment Utilized During Treatment Gait belt   Activity Tolerance Patient tolerated treatment well   Behavior During Therapy [facility] for tasks assessed/performed      Past Medical History:  Diagnosis Date  . Arthritis    legs, lower back, hands  . Cancer ([place])    endometrial  . Diabetes mellitus   . Ectopic pregnancy   . Eczema   . Headache(784.0)    hx of  . History of kidney stones   . Hyperlipidemia    no meds  . Hypertension   . Hypothyroidism   . Persistent atrial fibrillation ([place]) 09/28/2016   Relatively new Dx: Asymptomatic.  Rate controlled without medication.  . SVD (spontaneous vaginal delivery)    x 3  . Thyroid disease     Past Surgical History:  Procedure Laterality Date  . ABDOMINAL HYSTERECTOMY Bilateral 06/26/2013   Procedure: TOTAL ABDOMINAL HYSTERECTOMY WITH BILATERAL SALPINGO OOPHERECTOMY WITH PELVIC LYMPHADNECTOMY;  Surgeon: Alvino Chapel, MD;  Location: [facility];  Service: Gynecology;  Laterality: Bilateral;  . BACK SURGERY  06/26/2013  . ECTOPIC PREGNANCY SURGERY    . HERNIA REPAIR    . HYSTEROSCOPY W/D&C N/A 04/26/2013   Procedure: DILATATION AND CURETTAGE /HYSTEROSCOPY;  Surgeon: Woodroe Mode, MD;  Location: [facility];  Service: Gynecology;  Laterality: N/A;  . JOINT REPLACEMENT    . LAPAROTOMY     for ectopic pregnancy  . LAPAROTOMY N/A 06/26/2013   Procedure: EXPLORATORY LAPAROTOMY;  Surgeon: Alvino Chapel, MD;   Location: [facility];  Service: Gynecology;  Laterality: N/A;  . lt knee arthroscopy    . rt total hip replacement    . TONSILLECTOMY    . TOTAL KNEE ARTHROPLASTY Left 12/22/2016   Procedure: LEFT TOTAL KNEE ARTHROPLASTY;  Surgeon: Latanya Maudlin, MD;  Location: [facility];  Service: Orthopedics;  Laterality: Left;  . WISDOM TOOTH EXTRACTION      There were no vitals filed for this visit.      Subjective Assessment - 01/06/17 1120    Subjective having some pain but overall doing well.   Patient Stated Goals Get out of pain and walk good.   Currently in Pain? Yes   Pain Score 7    Pain Location Knee   Pain Orientation Left   Pain Descriptors / Indicators Aching;Sore;Sharp   Pain Type Surgical pain   Pain Onset 1 to 4 weeks ago   Pain Frequency Constant   Aggravating Factors  walking, bending   Pain Relieving Factors meds                         [facility] Adult PT Treatment/Exercise - 01/06/17 1122      Ambulation/Gait   Ambulation/Gait Yes   Ambulation/Gait Assistance 4: Min guard   Ambulation Distance (Feet) 120 Feet   Assistive device Straight cane   Gait Pattern Step-to pattern;Antalgic   Gait Comments utilized  3 to 2 point gait pattern with SPC, pt minguard A and feel pt safe to practice indoors with niece     Knee/Hip Exercises: Aerobic   Nustep level 3 x 15 minutes moving forward x 3 to increase knee flexion.     Knee/Hip Exercises: Supine   Quad Sets Left;10 reps   Quad Sets Limitations mod cues for technique and quad activation; best response with performing both legs at same time     Electrical Stimulation   Electrical Stimulation Location Left knee.   Electrical Stimulation Action IFC   Electrical Stimulation Parameters to tolerance x 15 min   Electrical Stimulation Goals Edema;Pain     Vasopneumatic   Number Minutes Vasopneumatic  15 minutes   Vasopnuematic Location  Knee   Vasopneumatic Pressure Medium   Vasopneumatic Temperature  max cold      Manual Therapy   Manual therapy comments In supine left patellar mobs in all direction f/b passive stretching into flexion and extension x 8 minutes.                PT Education - 01/06/17 1229    Education provided Yes   Education Details practice with SPC at home   Person(s) Educated Patient;Caregiver(s)  niece   Methods Explanation   Comprehension Verbalized understanding          PT Short Term Goals - 01/03/17 1451      PT SHORT TERM GOAL #1   Title Independent with an initial HEP.   Time 2   Period Weeks   Status New     PT SHORT TERM GOAL #2   Title Full active left knee extension in order to normalize gait   Time 2   Period Weeks   Status New           PT Long Term Goals - 01/03/17 1500      PT LONG TERM GOAL #1   Title Independewnt with an advanced HEP.   Time 8   Period Weeks   Status New     PT LONG TERM GOAL #2   Title Active left knee flexion to 115 degrees+ so the patient can perform functional tasks and do so with pain not > 2-3/10.   Time 8   Period Weeks   Status New     PT LONG TERM GOAL #3   Title Increase left knee strength to a solid 4+/5 to provide good stability for accomplishment of functional activities   Time 8   Period Weeks   Status New     PT LONG TERM GOAL #4   Title Decrease edema to within 2.5 cms of non-affected side to assist with pain reduction and range of motion gains.   Time 8   Period Weeks   Status New     PT LONG TERM GOAL #5   Title Perform a reciprocating stair gait with one railing with pain not > 2-3/10.   Time 8   Period Weeks   Status New               Plan - 01/06/17 1230    Clinical Impression Statement Pt tolerated session well today and able to progress gait with SPC.  Pt safe to practice indoors with niece present and both verbalized understanding.  Will continue to benefit from PT to maximize function.   PT Treatment/Interventions ADLs/Self Care Home  Management;Cryotherapy;Electrical Stimulation;Stair training;Functional mobility training;Gait training;Patient/family education;Neuromuscular re-education;Therapeutic exercise;Therapeutic activities;Manual techniques;Passive range of motion;Vasopneumatic Device  PT Next Visit Plan TKA protocol.  Electrical stimulation and vasopneumatic.; continue gait with SPC, manual for extension    Consulted and Agree with Plan of Care Patient      Patient will benefit from skilled therapeutic intervention in order to improve the following deficits and impairments:  Abnormal gait, Decreased activity tolerance, Decreased mobility, Decreased strength, Decreased range of motion, Difficulty walking, Increased edema, Pain  Visit Diagnosis: Chronic pain of left knee  Stiffness of left knee, not elsewhere classified  Localized edema     Problem List Patient Active Problem List   Diagnosis Date Noted  . Status post total left knee replacement 12/22/2016  . S/P TKR (total knee replacement) 12/22/2016  . Preoperative cardiovascular examination 10/14/2016  . New onset a-fib ([place]) 10/14/2016  . Persistent atrial fibrillation ([place]) 09/28/2016  . Morbid obesity ([place]) 08/18/2015  . Type 2 diabetes mellitus with diabetic polyneuropathy ([place]) 05/01/2015  . Endometrial cancer ([place]) 05/08/2013  . Essential hypertension 03/18/2011  . Hypothyroidism 03/18/2011  . Osteoarthritis 03/18/2011  . Osteopenia 03/18/2011  . Hyperlipidemia 03/18/2011  . Diabetic neuropathy ([place]) 03/18/2011      Laureen Abrahams, PT, DPT 01/06/17 12:32 PM    [facility] [address] [place], [place], [address] Phone: [phone]   Fax:  [phone]  Name: Debra Campbell MRN: [id number] Date of Birth: [birth year]

## 2017-01-10 ENCOUNTER — Ambulatory Visit: Payer: Medicare Other | Admitting: Cardiology

## 2017-01-10 ENCOUNTER — Ambulatory Visit: Payer: Medicare Other | Admitting: Physical Therapy

## 2017-01-10 DIAGNOSIS — M25662 Stiffness of left knee, not elsewhere classified: Secondary | ICD-10-CM

## 2017-01-10 DIAGNOSIS — G8929 Other chronic pain: Secondary | ICD-10-CM | POA: Diagnosis not present

## 2017-01-10 DIAGNOSIS — M25562 Pain in left knee: Secondary | ICD-10-CM | POA: Diagnosis not present

## 2017-01-10 DIAGNOSIS — R600 Localized edema: Secondary | ICD-10-CM

## 2017-01-10 NOTE — Progress Notes (Signed)
Cardiology Office Note    Date:  01/11/2017   ID:  Debra, Campbell November 06, 1932, MRN [id number]  PCP:  Chevis Pretty, FNP  Cardiologist: Dr. Ellyn Hack  Chief Complaint  Patient presents with  . Follow-up    History of Present Illness:    Debra Campbell is a 83 y.o. female with past medical history of persistent atrial fibrillation, HTN, HLD, and Type 2 DM who presents to the office today for hospital follow-up.  Was last seen by Dr. Ellyn Hack in 09/2016 as a new patient referral for atrial fibrillation and cardiac clearance for an upcoming knee surgery. Was asymptomatic with this and continued on Toprol-XL 25mg  daily for rate-control. She was continued on 325mg  ASA with recommendations to start a NOAC following her surgery. An echocardiogram was performed which showed a preserved EF of 60-65% with no regional WMA.   She was admitted from 1/24 - 12/24/2016 for a left knee replacement. Hospital course was uncomplicated and she was discharged to SNF for rehab. Was not started on a NOAC at discharge but instead started on 325mg  ASA BID.   She reports doing well since her recent hospitalization. Was only at SNF for 5 days before being discharged home. She is participating in physical therapy 3 times per week and says she is progressing very well. Was seen by her Orthopedic Surgeon earlier this morning and says he was pleased with her progress.   She denies any recent chest discomfort, palpitations, or dyspnea with exertion. No lightheadedness, dizziness, or presyncope.  Denies any recent bleeding. No history of GI bleeds or intracranial bleeding. Reports she was never started on Xarelto during her recent hospital admission.   Past Medical History:  Diagnosis Date  . Arthritis    legs, lower back, hands  . Cancer ([place])    endometrial  . Diabetes mellitus   . Ectopic pregnancy   . Eczema   . Headache(784.0)    hx of  . History of kidney stones   . Hyperlipidemia    no meds    . Hypertension   . Hypothyroidism   . Persistent atrial fibrillation ([place]) 09/28/2016   Relatively new Dx: Asymptomatic.  Rate controlled without medication.  . SVD (spontaneous vaginal delivery)    x 3  . Thyroid disease     Past Surgical History:  Procedure Laterality Date  . ABDOMINAL HYSTERECTOMY Bilateral 06/26/2013   Procedure: TOTAL ABDOMINAL HYSTERECTOMY WITH BILATERAL SALPINGO OOPHERECTOMY WITH PELVIC LYMPHADNECTOMY;  Surgeon: Alvino Chapel, MD;  Location: [facility];  Service: Gynecology;  Laterality: Bilateral;  . BACK SURGERY  06/26/2013  . ECTOPIC PREGNANCY SURGERY    . HERNIA REPAIR    . HYSTEROSCOPY W/D&C N/A 04/26/2013   Procedure: DILATATION AND CURETTAGE /HYSTEROSCOPY;  Surgeon: Woodroe Mode, MD;  Location: [facility];  Service: Gynecology;  Laterality: N/A;  . JOINT REPLACEMENT    . LAPAROTOMY     for ectopic pregnancy  . LAPAROTOMY N/A 06/26/2013   Procedure: EXPLORATORY LAPAROTOMY;  Surgeon: Alvino Chapel, MD;  Location: [facility];  Service: Gynecology;  Laterality: N/A;  . lt knee arthroscopy    . rt total hip replacement    . TONSILLECTOMY    . TOTAL KNEE ARTHROPLASTY Left 12/22/2016   Procedure: LEFT TOTAL KNEE ARTHROPLASTY;  Surgeon: Latanya Maudlin, MD;  Location: [facility];  Service: Orthopedics;  Laterality: Left;  . WISDOM TOOTH EXTRACTION      Current Medications: Outpatient Medications Prior to Visit  Medication Sig Dispense  Refill  . furosemide (LASIX) 20 MG tablet Take 1 tablet (20 mg total) by mouth daily. (Patient taking differently: Take 20 mg by mouth daily as needed for edema. ) 90 tablet 1  . gabapentin (NEURONTIN) 100 MG capsule Take 300 mg by mouth at bedtime.    [name] glucose blood (ACCU-CHEK AVIVA PLUS) test strip Test 1x per day and prn. DX.E11.9 100 each 12  . Lancets (ACCU-CHEK SOFT TOUCH) lancets Test BS 1 x daily and PRN DX. E11.9 100 each 12  . levothyroxine (SYNTHROID, LEVOTHROID) 175 MCG tablet Take 1 tablet (175 mcg total) by  mouth daily before breakfast. 90 tablet 1  . losartan (COZAAR) 50 MG tablet Take 1 tablet (50 mg total) by mouth at bedtime. 90 tablet 1  . metFORMIN (GLUCOPHAGE) 1000 MG tablet Take 1 tablet (1,000 mg total) by mouth 2 (two) times daily with a meal. 180 tablet 1  . methocarbamol (ROBAXIN) 500 MG tablet Take 1 tablet (500 mg total) by mouth every 6 (six) hours as needed for muscle spasms. 40 tablet 1  . metoprolol succinate (TOPROL XL) 25 MG 24 hr tablet Take 1 tablet (25 mg total) by mouth daily. 90 tablet 3  . Multiple Vitamin (MULTIVITAMIN WITH MINERALS) TABS Take 1 tablet by mouth daily.    [name] oxyCODONE-acetaminophen (PERCOCET/ROXICET) 5-325 MG tablet Take 1-2 tablets by mouth every 4 (four) hours as needed for moderate pain. 80 tablet 0  . triamcinolone cream (KENALOG) 0.5 % Apply 1 application topically 3 (three) times daily. (Patient taking differently: Apply 1 application topically daily as needed (itching). ) 30 g 0  . aspirin EC 325 MG tablet Take 1 tablet (325 mg total) by mouth 2 (two) times daily. 30 tablet 0   No facility-administered medications prior to visit.      Allergies:   Ace inhibitors; Iohexol; Statins; Iodine; Penicillins; and Sulfa antibiotics   Social History   Social History  . Marital status: Married    Spouse name: N/A  . Number of children: N/A  . Years of education: N/A   Social History Main Topics  . Smoking status: Never Smoker  . Smokeless tobacco: Never Used  . Alcohol use No  . Drug use: No  . Sexual activity: No   Other Topics Concern  . None   Social History Narrative  . None     Family History:  The patient's family history includes Arthritis in her mother; Breast cancer in her daughter and sister; Cancer in her daughter and sister; Heart attack in her father; Heart disease in her father and sister; Neuropathy in her sister; Suicidality (age of onset: 9) in her brother; Thyroid disease in her father.   Review of Systems:   Please see the  history of present illness.     General:  No chills, fever, night sweats or weight changes.  Cardiovascular:  No chest pain, dyspnea on exertion, edema, orthopnea, palpitations, paroxysmal nocturnal dyspnea. Dermatological: No rash, lesions/masses Respiratory: No cough, dyspnea Urologic: No hematuria, dysuria Abdominal:   No nausea, vomiting, diarrhea, bright red blood per rectum, melena, or hematemesis MSK: Positive for left knee pain. Neurologic:  No visual changes, wkns, changes in mental status. All other systems reviewed and are otherwise negative except as noted above.   Physical Exam:    VS:  BP 104/60   Pulse 97   Ht 5\' 4"  (1.626 m)   Wt 219 lb (99.3 kg)   BMI 37.59 kg/m    General: Well developed,  elderly Caucasian female appearing in no acute distress. Head: Normocephalic, atraumatic, sclera non-icteric, no xanthomas, nares are without discharge.  Neck: No carotid bruits. JVD not elevated.  Lungs: Respirations regular and unlabored, without wheezes or rales.  Heart: Irregularly irregular. No S3 or S4.  No murmur, no rubs, or gallops appreciated. Abdomen: Soft, non-tender, non-distended with normoactive bowel sounds. No hepatomegaly. No rebound/guarding. No obvious abdominal masses. Msk:  Strength and tone appear normal for age. No joint deformities or effusions. Extremities: No clubbing or cyanosis. Trace lower extremity edema along left shin.  Distal pedal pulses are 2+ bilaterally. Neuro: Alert and oriented X 3. Moves all extremities spontaneously. No focal deficits noted. Psych:  Responds to questions appropriately with a normal affect. Skin: No rashes or lesions noted  Wt Readings from Last 3 Encounters:  01/11/17 219 lb (99.3 kg)  12/22/16 225 lb (102.1 kg)  12/17/16 225 lb (102.1 kg)      Studies/Labs Reviewed:   EKG:  EKG is not ordered today.    Recent Labs: 12/17/2016: ALT 17 12/24/2016: BUN 15; Creatinine, Ser 0.94; Hemoglobin 10.8; Platelets 130;  Potassium 4.4; Sodium 135   Lipid Panel    Component Value Date/Time   CHOL 186 09/28/2016 0817   CHOL 230 (H) 03/19/2013 1034   TRIG 108 09/28/2016 0817   TRIG 253 (H) 05/01/2015 0913   TRIG 149 03/19/2013 1034   HDL 69 09/28/2016 0817   HDL 42 05/01/2015 0913   HDL 52 03/19/2013 1034   CHOLHDL 2.7 09/28/2016 0817   LDLCALC 95 09/28/2016 0817   LDLCALC 122 (H) 07/11/2014 0922   LDLCALC 148 (H) 03/19/2013 1034    Additional studies/ records that were reviewed today include:   Echocardiogram: 09/2016 Study Conclusions  - Left ventricle: The cavity size was normal. Wall thickness was   normal. Systolic function was normal. The estimated ejection   fraction was in the range of 60% to 65%. The study is not   technically sufficient to allow evaluation of LV diastolic   function. - Mitral valve: Mildly thickened leaflets . There was mild   regurgitation. - Left atrium: Moderately dilated. - Right atrium: The atrium was mildly dilated. - Tricuspid valve: There was mild regurgitation. - Pulmonary arteries: PA peak pressure: 42 mm Hg (S). - Inferior vena cava: The vessel was normal in size. The   respirophasic diameter changes were in the normal range (>= 50%),   consistent with normal central venous pressure.  Impressions:  - LVEF 60-65%, normal wall thickness, normal wall motion, mild MR,   moderate LAE, mild RAE, mild TR, RVSP 42 mmHg, normal IVC.  Assessment:    1. Persistent atrial fibrillation ([place])   2. Essential hypertension      Plan:   In order of problems listed above:  1. Persistent Atrial Fibrillation - initially seen as a new patient referral for this in 09/2016. Recent EKG in 11/2016 showed persistent atrial fibrillation and her examination today is consistent with this. She denies any associated chest discomfort, palpitations, dyspnea, lightheadedness, or presyncope.  - HR in the 90's during today's visit. She does report continued pain along her  left knee following recent surgery which could be a driving factor for her HR. If HR remains elevated, would recommend increasing Toprol-XL from 25mg  daily to 50mg  daily, but would need to reduce Losartan dosing to allow BP room for this adjustment.  - This patients CHA2DS2-VASc Score and unadjusted Ischemic Stroke Rate (% per year) is equal to 7.2 %  stroke rate/year from a score of 5 (HTN, DM, Female, Age (2)). She was not started on full-dose anticoagulation following her recent surgery. With her elevated score, would recommend initiation of full-dose anticoagulation. Risks and benefits of this were discussed with the patient and she agrees to start this. Will start Xarelto 20mg  daily (Creatinine clearance ~ 76). Patient will check with her insurance to see if Xarelto or Eliquis is preferred. Was provided with 30-day card.   2. Essential HTN - BP at 104/60 during today's visit.  - continue Lasix 20mg  daily, Losartan 50mg  daily, and Toprol-XL 25mg  daily.     Medication Adjustments/Labs and Tests Ordered: Current medicines are reviewed at length with the patient today.  Concerns regarding medicines are outlined above.  Medication changes, Labs and Tests ordered today are listed in the Patient Instructions below. Patient Instructions  Medication Instructions:  STOP Aspirin   START Xarelto 20mg  Take 1 tablet by mouth daily  Labwork: None   Testing/Procedures: None   Follow-Up: Your physician wants you to follow-up in: [place]. You will receive a reminder letter in the mail two months in advance. If you don't receive a letter, please call our office to schedule the follow-up appointment.  Any Other Special Instructions Will Be Listed Below (If Applicable).  CALL your pharmacy and see which med your insurance will med they will cover Xarelto or Eliquis.  If you need a refill on your cardiac medications before your next appointment, please call your pharmacy.    Arna Medici, [place]  01/11/2017 4:42 PM    [facility] [place], [place], [place]  [address] Phone: [phone]; Fax: [phone]  [address], [place], [place] [address] Phone: [phone]

## 2017-01-10 NOTE — Therapy (Signed)
Debra Campbell, [place], [address] Phone: [phone]   Fax:  [phone]  Physical Therapy Treatment  Patient Details  Name: Debra Campbell MRN: [id number] Date of Birth: June 09, 1932 Referring Provider: Latanya Maudlin MD.  Encounter Date: 01/10/2017      PT End of Session - 01/10/17 1211    PT Start Time 1115   PT Stop Time 1212   PT Time Calculation (min) 57 min   Activity Tolerance Patient tolerated treatment well   Behavior During Therapy [name] for tasks assessed/performed      Past Medical History:  Diagnosis Date  . Arthritis    legs, lower back, hands  . Cancer ([place])    endometrial  . Diabetes mellitus   . Ectopic pregnancy   . Eczema   . Headache(784.0)    hx of  . History of kidney stones   . Hyperlipidemia    no meds  . Hypertension   . Hypothyroidism   . Persistent atrial fibrillation ([place]) 09/28/2016   Relatively new Dx: Asymptomatic.  Rate controlled without medication.  . SVD (spontaneous vaginal delivery)    x 3  . Thyroid disease     Past Surgical History:  Procedure Laterality Date  . ABDOMINAL HYSTERECTOMY Bilateral 06/26/2013   Procedure: TOTAL ABDOMINAL HYSTERECTOMY WITH BILATERAL SALPINGO OOPHERECTOMY WITH PELVIC LYMPHADNECTOMY;  Surgeon: Debra Chapel, MD;  Location: [facility];  Service: Gynecology;  Laterality: Bilateral;  . BACK SURGERY  06/26/2013  . ECTOPIC PREGNANCY SURGERY    . HERNIA REPAIR    . HYSTEROSCOPY W/D&C N/A 04/26/2013   Procedure: DILATATION AND CURETTAGE /HYSTEROSCOPY;  Surgeon: Woodroe Mode, MD;  Location: [facility];  Service: Gynecology;  Laterality: N/A;  . JOINT REPLACEMENT    . LAPAROTOMY     for ectopic pregnancy  . LAPAROTOMY N/A 06/26/2013   Procedure: EXPLORATORY LAPAROTOMY;  Surgeon: Debra Chapel, MD;  Location: [facility];  Service: Gynecology;  Laterality: N/A;  . lt knee arthroscopy    . rt total hip replacement    . TONSILLECTOMY     . TOTAL KNEE ARTHROPLASTY Left 12/22/2016   Procedure: LEFT TOTAL KNEE ARTHROPLASTY;  Surgeon: Debra Maudlin, MD;  Location: [facility];  Service: Orthopedics;  Laterality: Left;  . WISDOM TOOTH EXTRACTION      There were no vitals filed for this visit.      Subjective Assessment - 01/10/17 1154    Subjective I'm doing good.   Patient Stated Goals Get out of pain and walk good.   Pain Score 3    Pain Location Knee   Pain Orientation Left   Pain Descriptors / Indicators Aching;Sore;Sharp   Pain Type Surgical pain   Pain Onset 1 to 4 weeks ago                         [facility] Adult PT Treatment/Exercise - 01/10/17 0001      Exercises   Exercises Knee/Hip     Knee/Hip Exercises: Aerobic   Nustep Level 4 x 20 minutes moving forward x 4 to increase knee flexion.     Acupuncturist Location --  Left knee.   Electrical Stimulation Action IFC   Electrical Stimulation Parameters 1-10 Hz x 15 minutes.   Electrical Stimulation Goals Edema;Pain     Vasopneumatic   Number Minutes Vasopneumatic  15 minutes   Vasopnuematic Location  --  Left knee.   Vasopneumatic  Pressure Medium     Manual Therapy   Manual therapy comments In supine PROM into left knee flexion and extension x 8 minutes.                  PT Short Term Goals - 01/03/17 1451      PT SHORT TERM GOAL #1   Title Independent with an initial HEP.   Time 2   Period Weeks   Status New     PT SHORT TERM GOAL #2   Title Full active left knee extension in order to normalize gait   Time 2   Period Weeks   Status New           PT Long Term Goals - 01/03/17 1500      PT LONG TERM GOAL #1   Title Independewnt with an advanced HEP.   Time 8   Period Weeks   Status New     PT LONG TERM GOAL #2   Title Active left knee flexion to 115 degrees+ so the patient can perform functional tasks and do so with pain not > 2-3/10.   Time 8   Period Weeks   Status  New     PT LONG TERM GOAL #3   Title Increase left knee strength to a solid 4+/5 to provide good stability for accomplishment of functional activities   Time 8   Period Weeks   Status New     PT LONG TERM GOAL #4   Title Decrease edema to within 2.5 cms of non-affected side to assist with pain reduction and range of motion gains.   Time 8   Period Weeks   Status New     PT LONG TERM GOAL #5   Title Perform a reciprocating stair gait with one railing with pain not > 2-3/10.   Time 8   Period Weeks   Status New               Plan - 01/10/17 1206    Clinical Impression Statement Left knee exhibits full passive left knee extension and flexion to 100 degrees.      Patient will benefit from skilled therapeutic intervention in order to improve the following deficits and impairments:     Visit Diagnosis: Chronic pain of left knee  Stiffness of left knee, not elsewhere classified  Localized edema     Problem List Patient Active Problem List   Diagnosis Date Noted  . Status post total left knee replacement 12/22/2016  . S/P TKR (total knee replacement) 12/22/2016  . Preoperative cardiovascular examination 10/14/2016  . New onset a-fib ([place]) 10/14/2016  . Persistent atrial fibrillation ([place]) 09/28/2016  . Morbid obesity ([place]) 08/18/2015  . Type 2 diabetes mellitus with diabetic polyneuropathy ([place]) 05/01/2015  . Endometrial cancer ([place]) 05/08/2013  . Essential hypertension 03/18/2011  . Hypothyroidism 03/18/2011  . Osteoarthritis 03/18/2011  . Osteopenia 03/18/2011  . Hyperlipidemia 03/18/2011  . Diabetic neuropathy ([place]) 03/18/2011    Chizara Mena, [place] MPT 01/10/2017, 12:12 PM  [facility] [address] [place], [place], [address] Phone: [phone]   Fax:  [phone]  Name: Debra Campbell MRN: [id number] Date of Birth: 10/25/1932

## 2017-01-11 ENCOUNTER — Ambulatory Visit (INDEPENDENT_AMBULATORY_CARE_PROVIDER_SITE_OTHER): Payer: Medicare Other | Admitting: Student

## 2017-01-11 ENCOUNTER — Ambulatory Visit: Payer: Medicare Other | Admitting: Physical Therapy

## 2017-01-11 ENCOUNTER — Encounter: Payer: Self-pay | Admitting: Student

## 2017-01-11 VITALS — BP 104/60 | HR 97 | Ht 64.0 in | Wt 219.0 lb

## 2017-01-11 DIAGNOSIS — I481 Persistent atrial fibrillation: Secondary | ICD-10-CM

## 2017-01-11 DIAGNOSIS — Z96652 Presence of left artificial knee joint: Secondary | ICD-10-CM | POA: Diagnosis not present

## 2017-01-11 DIAGNOSIS — I4819 Other persistent atrial fibrillation: Secondary | ICD-10-CM

## 2017-01-11 DIAGNOSIS — I10 Essential (primary) hypertension: Secondary | ICD-10-CM

## 2017-01-11 DIAGNOSIS — Z471 Aftercare following joint replacement surgery: Secondary | ICD-10-CM | POA: Diagnosis not present

## 2017-01-11 MED ORDER — RIVAROXABAN 20 MG PO TABS: 20.0000 mg | ORAL_TABLET | Freq: Every day | ORAL | 0 refills | Status: DC

## 2017-01-11 NOTE — Patient Instructions (Signed)
Medication Instructions:  STOP Aspirin   START Xarelto 20mg  Take 1 tablet by mouth daily  Labwork: None   Testing/Procedures: None   Follow-Up: Your physician wants you to follow-up in: [place]. You will receive a reminder letter in the mail two months in advance. If you don't receive a letter, please call our office to schedule the follow-up appointment.  Any Other Special Instructions Will Be Listed Below (If Applicable).  CALL your pharmacy and see which med your insurance will med they will cover Xarelto or Eliquis.  If you need a refill on your cardiac medications before your next appointment, please call your pharmacy.

## 2017-01-12 ENCOUNTER — Ambulatory Visit: Payer: Medicare Other | Admitting: Physical Therapy

## 2017-01-12 ENCOUNTER — Encounter: Payer: Self-pay | Admitting: Physical Therapy

## 2017-01-12 DIAGNOSIS — M25562 Pain in left knee: Principal | ICD-10-CM

## 2017-01-12 DIAGNOSIS — M25662 Stiffness of left knee, not elsewhere classified: Secondary | ICD-10-CM | POA: Diagnosis not present

## 2017-01-12 DIAGNOSIS — G8929 Other chronic pain: Secondary | ICD-10-CM | POA: Diagnosis not present

## 2017-01-12 DIAGNOSIS — R600 Localized edema: Secondary | ICD-10-CM | POA: Diagnosis not present

## 2017-01-12 NOTE — Therapy (Signed)
[facility] [place], [place], [address] Phone: [phone]   Fax:  [phone]  Physical Therapy Treatment  Patient Details  Name: Debra Campbell MRN: [id number] Date of Birth: 1932/10/06 Referring Provider: Latanya Maudlin MD.  Encounter Date: 01/12/2017      PT End of Session - 01/12/17 1120    Visit Number 5   Number of Visits 12   Date for PT Re-Evaluation 03/04/17   PT Start Time 1118   PT Stop Time 1208   PT Time Calculation (min) 50 min   Activity Tolerance Patient tolerated treatment well   Behavior During Therapy [facility] for tasks assessed/performed      Past Medical History:  Diagnosis Date  . Arthritis    legs, lower back, hands  . Cancer ([place])    endometrial  . Diabetes mellitus   . Ectopic pregnancy   . Eczema   . Headache(784.0)    hx of  . History of kidney stones   . Hyperlipidemia    no meds  . Hypertension   . Hypothyroidism   . Persistent atrial fibrillation ([place]) 09/28/2016   Relatively new Dx: Asymptomatic.  Rate controlled without medication.  . SVD (spontaneous vaginal delivery)    x 3  . Thyroid disease     Past Surgical History:  Procedure Laterality Date  . ABDOMINAL HYSTERECTOMY Bilateral 06/26/2013   Procedure: TOTAL ABDOMINAL HYSTERECTOMY WITH BILATERAL SALPINGO OOPHERECTOMY WITH PELVIC LYMPHADNECTOMY;  Surgeon: Alvino Chapel, MD;  Location: [facility];  Service: Gynecology;  Laterality: Bilateral;  . BACK SURGERY  06/26/2013  . ECTOPIC PREGNANCY SURGERY    . HERNIA REPAIR    . HYSTEROSCOPY W/D&C N/A 04/26/2013   Procedure: DILATATION AND CURETTAGE /HYSTEROSCOPY;  Surgeon: Woodroe Mode, MD;  Location: [facility];  Service: Gynecology;  Laterality: N/A;  . JOINT REPLACEMENT    . LAPAROTOMY     for ectopic pregnancy  . LAPAROTOMY N/A 06/26/2013   Procedure: EXPLORATORY LAPAROTOMY;  Surgeon: Alvino Chapel, MD;  Location: [facility];  Service: Gynecology;  Laterality:  N/A;  . lt knee arthroscopy    . rt total hip replacement    . TONSILLECTOMY    . TOTAL KNEE ARTHROPLASTY Left 12/22/2016   Procedure: LEFT TOTAL KNEE ARTHROPLASTY;  Surgeon: Latanya Maudlin, MD;  Location: [facility];  Service: Orthopedics;  Laterality: Left;  . WISDOM TOOTH EXTRACTION      There were no vitals filed for this visit.      Subjective Assessment - 01/12/17 1119    Subjective Reports that the MD was very pleased at her last appointment and states that she was 2-3 months ahead of schedule.   Patient Stated Goals Get out of pain and walk good.   Currently in Pain? Yes   Pain Score 3    Pain Location Knee   Pain Orientation Left   Pain Descriptors / Indicators Discomfort   Pain Type Surgical pain            [facility] PT Assessment - 01/12/17 0001      Assessment   Medical Diagnosis Left total knee replacement.   Onset Date/Surgical Date 12/22/16   Next MD Visit 02/22/2017     Restrictions   Weight Bearing Restrictions No                     [facility] Adult PT Treatment/Exercise - 01/12/17 0001      Knee/Hip Exercises: Aerobic   Nustep L5,  seat 8 x10 min     Knee/Hip Exercises: Standing   Hip Flexion AROM;Left;2 sets;10 reps;Knee bent   Forward Lunges Left;2 sets;10 reps;3 seconds   Hip Abduction AROM;Both;2 sets;10 reps;Knee straight   Forward Step Up Left;2 sets;10 reps;Hand Hold: 2;Step Height: 6"   Rocker Board 2 minutes     Knee/Hip Exercises: Seated   Long Arc Quad Strengthening;Left;2 sets;10 reps;Weights   Long Arc Quad Weight 3 lbs.     Modalities   Modalities Passenger transport manager Location L knee   Electrical Stimulation Action IFC   Electrical Stimulation Parameters 1-10 hz x15 min   Electrical Stimulation Goals Edema;Pain     Vasopneumatic   Number Minutes Vasopneumatic  15 minutes   Vasopnuematic Location  Knee   Vasopneumatic Pressure Medium   Vasopneumatic  Temperature  44     Manual Therapy   Manual Therapy Soft tissue mobilization;Passive ROM   Soft tissue mobilization L patella and incision mobilizations to promote proper mobility   Passive ROM PROM of L knee into flex/ext with gentle holds at end range                  PT Short Term Goals - 01/03/17 1451      PT SHORT TERM GOAL #1   Title Independent with an initial HEP.   Time 2   Period Weeks   Status New     PT SHORT TERM GOAL #2   Title Full active left knee extension in order to normalize gait   Time 2   Period Weeks   Status New           PT Long Term Goals - 01/03/17 1500      PT LONG TERM GOAL #1   Title Independewnt with an advanced HEP.   Time 8   Period Weeks   Status New     PT LONG TERM GOAL #2   Title Active left knee flexion to 115 degrees+ so the patient can perform functional tasks and do so with pain not > 2-3/10.   Time 8   Period Weeks   Status New     PT LONG TERM GOAL #3   Title Increase left knee strength to a solid 4+/5 to provide good stability for accomplishment of functional activities   Time 8   Period Weeks   Status New     PT LONG TERM GOAL #4   Title Decrease edema to within 2.5 cms of non-affected side to assist with pain reduction and range of motion gains.   Time 8   Period Weeks   Status New     PT LONG TERM GOAL #5   Title Perform a reciprocating stair gait with one railing with pain not > 2-3/10.   Time 8   Period Weeks   Status New               Plan - 01/12/17 1200    Clinical Impression Statement Patient tolerated today's treatment well with no reports of increased pain with any of the exercises. Patient able to complete exercises with only complaint of fatigue following standing exercises. Patient had minimal L hip hike with L forward step ups today to 6" step. Patient able to tolerate holds at full extension of LAQ with 3# ankleweights. Smooth arc of motion with PROM of L knee. Edema still  present throughout L knee that slightly limited patella mobility. Minimal  scabbing noted along L knee incision upon observation today. Normal modalities response noted following removal of the modalities.   Rehab Potential Good   PT Frequency 3x / week   PT Duration 4 weeks   PT Treatment/Interventions ADLs/Self Care Home Management;Cryotherapy;Electrical Stimulation;Stair training;Functional mobility training;Gait training;Patient/family education;Neuromuscular re-education;Therapeutic exercise;Therapeutic activities;Manual techniques;Passive range of motion;Vasopneumatic Device   PT Next Visit Plan TKA protocol.  Electrical stimulation and vasopneumatic.; continue gait with SPC, manual for extension    Consulted and Agree with Plan of Care Patient      Patient will benefit from skilled therapeutic intervention in order to improve the following deficits and impairments:  Abnormal gait, Decreased activity tolerance, Decreased mobility, Decreased strength, Decreased range of motion, Difficulty walking, Increased edema, Pain  Visit Diagnosis: Chronic pain of left knee  Stiffness of left knee, not elsewhere classified  Localized edema     Problem List Patient Active Problem List   Diagnosis Date Noted  . Status post total left knee replacement 12/22/2016  . S/P TKR (total knee replacement) 12/22/2016  . Preoperative cardiovascular examination 10/14/2016  . New onset a-fib ([place]) 10/14/2016  . Persistent atrial fibrillation ([place]) 09/28/2016  . Morbid obesity ([place]) 08/18/2015  . Type 2 diabetes mellitus with diabetic polyneuropathy ([place]) 05/01/2015  . Endometrial cancer ([place]) 05/08/2013  . Essential hypertension 03/18/2011  . Hypothyroidism 03/18/2011  . Osteoarthritis 03/18/2011  . Osteopenia 03/18/2011  . Hyperlipidemia 03/18/2011  . Diabetic neuropathy ([place]) 03/18/2011    Wynelle Fanny, PTA 01/12/2017, 12:12 PM  [facility] [place], [place], [address] Phone: [phone]   Fax:  [phone]  Name: Debra Campbell MRN: [id number] Date of Birth: [birth year]

## 2017-01-14 ENCOUNTER — Encounter: Payer: Self-pay | Admitting: Physical Therapy

## 2017-01-14 ENCOUNTER — Ambulatory Visit: Payer: Medicare Other | Admitting: Physical Therapy

## 2017-01-14 DIAGNOSIS — G8929 Other chronic pain: Secondary | ICD-10-CM

## 2017-01-14 DIAGNOSIS — M25662 Stiffness of left knee, not elsewhere classified: Secondary | ICD-10-CM

## 2017-01-14 DIAGNOSIS — M25562 Pain in left knee: Secondary | ICD-10-CM | POA: Diagnosis not present

## 2017-01-14 DIAGNOSIS — R600 Localized edema: Secondary | ICD-10-CM

## 2017-01-14 NOTE — Therapy (Signed)
[facility] [place], [place], [address] Phone: [phone]   Fax:  [phone]  Physical Therapy Treatment  Patient Details  Name: Debra Campbell MRN: [id number] Date of Birth: May 11, 1932 Referring Provider: Latanya Maudlin MD.  Encounter Date: 01/14/2017      PT End of Session - 01/14/17 1129    Visit Number 6   Number of Visits 12   Date for PT Re-Evaluation 03/04/17   PT Start Time 1114   PT Stop Time 1209   PT Time Calculation (min) 55 min   Activity Tolerance Patient tolerated treatment well   Behavior During Therapy [facility] for tasks assessed/performed      Past Medical History:  Diagnosis Date  . Arthritis    legs, lower back, hands  . Cancer ([place])    endometrial  . Diabetes mellitus   . Ectopic pregnancy   . Eczema   . Headache(784.0)    hx of  . History of kidney stones   . Hyperlipidemia    no meds  . Hypertension   . Hypothyroidism   . Persistent atrial fibrillation ([place]) 09/28/2016   Relatively new Dx: Asymptomatic.  Rate controlled without medication.  . SVD (spontaneous vaginal delivery)    x 3  . Thyroid disease     Past Surgical History:  Procedure Laterality Date  . ABDOMINAL HYSTERECTOMY Bilateral 06/26/2013   Procedure: TOTAL ABDOMINAL HYSTERECTOMY WITH BILATERAL SALPINGO OOPHERECTOMY WITH PELVIC LYMPHADNECTOMY;  Surgeon: Alvino Chapel, MD;  Location: [facility];  Service: Gynecology;  Laterality: Bilateral;  . BACK SURGERY  06/26/2013  . ECTOPIC PREGNANCY SURGERY    . HERNIA REPAIR    . HYSTEROSCOPY W/D&C N/A 04/26/2013   Procedure: DILATATION AND CURETTAGE /HYSTEROSCOPY;  Surgeon: Woodroe Mode, MD;  Location: [facility];  Service: Gynecology;  Laterality: N/A;  . JOINT REPLACEMENT    . LAPAROTOMY     for ectopic pregnancy  . LAPAROTOMY N/A 06/26/2013   Procedure: EXPLORATORY LAPAROTOMY;  Surgeon: Alvino Chapel, MD;  Location: [facility];  Service: Gynecology;  Laterality:  N/A;  . lt knee arthroscopy    . rt total hip replacement    . TONSILLECTOMY    . TOTAL KNEE ARTHROPLASTY Left 12/22/2016   Procedure: LEFT TOTAL KNEE ARTHROPLASTY;  Surgeon: Latanya Maudlin, MD;  Location: [facility];  Service: Orthopedics;  Laterality: Left;  . WISDOM TOOTH EXTRACTION      There were no vitals filed for this visit.      Subjective Assessment - 01/14/17 1234    Subjective Reports that she didn't feel well yesterday. Niece reports that they have been working on ROM and walking at home.   Patient Stated Goals Get out of pain and walk good.   Currently in Pain? Other (Comment)  No pain assessment provided by patient            [facility] PT Assessment - 01/14/17 0001      Assessment   Medical Diagnosis Left total knee replacement.   Onset Date/Surgical Date 12/22/16   Next MD Visit 02/22/2017     Restrictions   Weight Bearing Restrictions No                     [facility] Adult PT Treatment/Exercise - 01/14/17 0001      Ambulation/Gait   Ambulation/Gait Yes   Ambulation/Gait Assistance 6: Modified independent (Device/Increase time)   Ambulation Distance (Feet) 119 Feet   Assistive device Straight cane  Gait Pattern Within Functional Limits     Knee/Hip Exercises: Aerobic   Nustep L5, seat 8 x14 min     Knee/Hip Exercises: Standing   Forward Lunges Left;2 sets;10 reps;3 seconds   Hip Abduction AROM;Both;2 sets;10 reps;Knee straight   Forward Step Up Left;1 set;15 reps;Hand Hold: 2;Step Height: 6"   Rocker Board 3 minutes     Knee/Hip Exercises: Seated   Long Arc Quad Strengthening;Left;3 sets;10 reps;Weights   Long Arc Quad Weight 3 lbs.     Modalities   Modalities Passenger transport manager Location L knee   Electrical Stimulation Action IFC   Electrical Stimulation Parameters 1-10 hz x15 min   Electrical Stimulation Goals Edema;Pain     Vasopneumatic   Number Minutes  Vasopneumatic  15 minutes   Vasopnuematic Location  Knee   Vasopneumatic Pressure Medium   Vasopneumatic Temperature  34     Manual Therapy   Manual Therapy Passive ROM   Passive ROM PROM of L knee into flexion with gentle holds at end range                  PT Short Term Goals - 01/03/17 1451      PT SHORT TERM GOAL #1   Title Independent with an initial HEP.   Time 2   Period Weeks   Status New     PT SHORT TERM GOAL #2   Title Full active left knee extension in order to normalize gait   Time 2   Period Weeks   Status New           PT Long Term Goals - 01/03/17 1500      PT LONG TERM GOAL #1   Title Independewnt with an advanced HEP.   Time 8   Period Weeks   Status New     PT LONG TERM GOAL #2   Title Active left knee flexion to 115 degrees+ so the patient can perform functional tasks and do so with pain not > 2-3/10.   Time 8   Period Weeks   Status New     PT LONG TERM GOAL #3   Title Increase left knee strength to a solid 4+/5 to provide good stability for accomplishment of functional activities   Time 8   Period Weeks   Status New     PT LONG TERM GOAL #4   Title Decrease edema to within 2.5 cms of non-affected side to assist with pain reduction and range of motion gains.   Time 8   Period Weeks   Status New     PT LONG TERM GOAL #5   Title Perform a reciprocating stair gait with one railing with pain not > 2-3/10.   Time 8   Period Weeks   Status New               Plan - 01/14/17 1228    Clinical Impression Statement Patient tolerated today's treatment fairly well as she reported increased fatigue today throughout treatment. Patient able to complete exercises with requiring cueing for erect stance during forward step ups. Patient able to now ambulate without deviation with SPC in clinic for 119 ft. Patient's L knee continues to present with edema with scabbing intermittant along L knee incision. Normal modalities response noted  following removal of the modalities. Patient and caregiver educated to progress to ambulation with SPC at this time.   Rehab Potential Good  PT Frequency 3x / week   PT Duration 4 weeks   PT Treatment/Interventions ADLs/Self Care Home Management;Cryotherapy;Electrical Stimulation;Stair training;Functional mobility training;Gait training;Patient/family education;Neuromuscular re-education;Therapeutic exercise;Therapeutic activities;Manual techniques;Passive range of motion;Vasopneumatic Device   PT Next Visit Plan Continue with TKR protocol with modalities for pain and edema with focus on strengthening per MPT POC.   Consulted and Agree with Plan of Care Patient;Family member/caregiver   Family Member Consulted Neice      Patient will benefit from skilled therapeutic intervention in order to improve the following deficits and impairments:  Abnormal gait, Decreased activity tolerance, Decreased mobility, Decreased strength, Decreased range of motion, Difficulty walking, Increased edema, Pain  Visit Diagnosis: Stiffness of left knee, not elsewhere classified  Chronic pain of left knee  Localized edema     Problem List Patient Active Problem List   Diagnosis Date Noted  . Status post total left knee replacement 12/22/2016  . S/P TKR (total knee replacement) 12/22/2016  . Preoperative cardiovascular examination 10/14/2016  . New onset a-fib ([place]) 10/14/2016  . Persistent atrial fibrillation ([place]) 09/28/2016  . Morbid obesity ([place]) 08/18/2015  . Type 2 diabetes mellitus with diabetic polyneuropathy ([place]) 05/01/2015  . Endometrial cancer ([place]) 05/08/2013  . Essential hypertension 03/18/2011  . Hypothyroidism 03/18/2011  . Osteoarthritis 03/18/2011  . Osteopenia 03/18/2011  . Hyperlipidemia 03/18/2011  . Diabetic neuropathy ([place]) 03/18/2011    Wynelle Fanny, PTA 01/14/2017, 12:36 PM  [facility] [address] [place], [place],  [address] Phone: [phone]   Fax:  [phone]  Name: Debra Campbell MRN: [id number] Date of Birth: 1932/03/13

## 2017-01-17 ENCOUNTER — Encounter: Payer: Self-pay | Admitting: Physical Therapy

## 2017-01-17 ENCOUNTER — Ambulatory Visit: Payer: Medicare Other | Admitting: Physical Therapy

## 2017-01-17 DIAGNOSIS — G8929 Other chronic pain: Secondary | ICD-10-CM | POA: Diagnosis not present

## 2017-01-17 DIAGNOSIS — R600 Localized edema: Secondary | ICD-10-CM | POA: Diagnosis not present

## 2017-01-17 DIAGNOSIS — M25562 Pain in left knee: Secondary | ICD-10-CM

## 2017-01-17 DIAGNOSIS — M25662 Stiffness of left knee, not elsewhere classified: Secondary | ICD-10-CM

## 2017-01-17 NOTE — Therapy (Signed)
[facility] [place], [place], [address] Phone: [phone]   Fax:  [phone]  Physical Therapy Treatment  Patient Details  Name: Debra Campbell MRN: [id number] Date of Birth: [birth year] Referring Provider: Latanya Maudlin MD.  Encounter Date: 01/17/2017      PT End of Session - 01/17/17 1436    Visit Number 7   Number of Visits 12   Date for PT Re-Evaluation 03/04/17   PT Start Time [id number]   PT Stop Time 1520   PT Time Calculation (min) 46 min   Activity Tolerance Patient tolerated treatment well;Patient limited by fatigue   Behavior During Therapy [facility] for tasks assessed/performed      Past Medical History:  Diagnosis Date  . Arthritis    legs, lower back, hands  . Cancer ([place])    endometrial  . Diabetes mellitus   . Ectopic pregnancy   . Eczema   . Headache(784.0)    hx of  . History of kidney stones   . Hyperlipidemia    no meds  . Hypertension   . Hypothyroidism   . Persistent atrial fibrillation ([place]) 09/28/2016   Relatively new Dx: Asymptomatic.  Rate controlled without medication.  . SVD (spontaneous vaginal delivery)    x 3  . Thyroid disease     Past Surgical History:  Procedure Laterality Date  . ABDOMINAL HYSTERECTOMY Bilateral 06/26/2013   Procedure: TOTAL ABDOMINAL HYSTERECTOMY WITH BILATERAL SALPINGO OOPHERECTOMY WITH PELVIC LYMPHADNECTOMY;  Surgeon: Alvino Chapel, MD;  Location: [facility];  Service: Gynecology;  Laterality: Bilateral;  . BACK SURGERY  06/26/2013  . ECTOPIC PREGNANCY SURGERY    . HERNIA REPAIR    . HYSTEROSCOPY W/D&C N/A 04/26/2013   Procedure: DILATATION AND CURETTAGE /HYSTEROSCOPY;  Surgeon: Woodroe Mode, MD;  Location: [facility];  Service: Gynecology;  Laterality: N/A;  . JOINT REPLACEMENT    . LAPAROTOMY     for ectopic pregnancy  . LAPAROTOMY N/A 06/26/2013   Procedure: EXPLORATORY LAPAROTOMY;  Surgeon: Alvino Chapel, MD;  [facility]: [facility];  Service:  Gynecology;  Laterality: N/A;  . lt knee arthroscopy    . rt total hip replacement    . TONSILLECTOMY    . TOTAL KNEE ARTHROPLASTY Left 12/22/2016   Procedure: LEFT TOTAL KNEE ARTHROPLASTY;  Surgeon: Latanya Maudlin, MD;  [facility]: [facility];  Service: Orthopedics;  Laterality: Left;  . WISDOM TOOTH EXTRACTION      There were no vitals filed for this visit.      Subjective Assessment - 01/17/17 1434    Subjective Reports that L knee feeling "so so."   Patient Stated Goals Get out of pain and walk good.   Currently in Pain? Yes   Pain Score 3    Pain Location Knee   Pain Orientation Left   Pain Descriptors / Indicators Sore   Pain Type Surgical pain   Pain Onset 1 to 4 weeks ago            [facility] PT Assessment - 01/17/17 0001      Assessment   Medical Diagnosis Left total knee replacement.   Onset Date/Surgical Date 12/22/16   Next MD Visit 02/22/2017     Restrictions   Weight Bearing Restrictions No                     [facility] Adult PT Treatment/Exercise - 01/17/17 0001      Knee/Hip Exercises: Aerobic   Nustep L5, seat 10  x10 min     Knee/Hip Exercises: Standing   Forward Lunges Left;2 sets;10 reps;3 seconds   Forward Step Up Left;1 set;15 reps;Hand Hold: 2;Step Height: 6"   Rocker Board 2 minutes     Knee/Hip Exercises: Seated   Long Arc Quad Strengthening;Left;3 sets;10 reps;Weights   Long Arc Quad Weight 3 lbs.     Knee/Hip Exercises: Supine   Straight Leg Raises Limitations Attempted but unable to complete per patient     Modalities   Modalities Electrical Stimulation;Vasopneumatic     Electrical Stimulation   Electrical Stimulation Location L knee   Electrical Stimulation Action IFC   Electrical Stimulation Parameters 1-10 hz x15 min   Electrical Stimulation Goals Edema;Pain     Vasopneumatic   Number Minutes Vasopneumatic  15 minutes   Vasopnuematic Location  Knee   Vasopneumatic Pressure Medium   Vasopneumatic Temperature  34      Manual Therapy   Manual Therapy Passive ROM;Soft tissue mobilization   Soft tissue mobilization L patella mobilizations to promote proper mobility   Passive ROM PROM of L knee into flexion with gentle holds at end range                  PT Short Term Goals - 01/03/17 1451      PT SHORT TERM GOAL #1   Title Independent with an initial HEP.   Time 2   Period Weeks   Status New     PT SHORT TERM GOAL #2   Title Full active left knee extension in order to normalize gait   Time 2   Period Weeks   Status New           PT Long Term Goals - 01/03/17 1500      PT LONG TERM GOAL #1   Title Independewnt with an advanced HEP.   Time 8   Period Weeks   Status New     PT LONG TERM GOAL #2   Title Active left knee flexion to 115 degrees+ so the patient can perform functional tasks and do so with pain not > 2-3/10.   Time 8   Period Weeks   Status New     PT LONG TERM GOAL #3   Title Increase left knee strength to a solid 4+/5 to provide good stability for accomplishment of functional activities   Time 8   Period Weeks   Status New     PT LONG TERM GOAL #4   Title Decrease edema to within 2.5 cms of non-affected side to assist with pain reduction and range of motion gains.   Time 8   Period Weeks   Status New     PT LONG TERM GOAL #5   Title Perform a reciprocating stair gait with one railing with pain not > 2-3/10.   Time 8   Period Weeks   Status New               Plan - 01/17/17 1507    Clinical Impression Statement Patient continues to present in clinic with limitations secondary to fatigue and strength. Patient able to complete forward step ups fairly well today although she fatigued quickly. Patient denied L hip abduction as she states that she does not want to hurt her hip replacement. Patient instructed with SLR although unable to complete secondary to weakness and fatigue with extensor lag present. Patient and caregiver ambulated into clinic  using [facility] for ambulation. Minimal inferior knee scabbing present along  the incision with no steristrips present. Edema present throughout L knee upon palpation with edema slightly limiting patellar mobilizations. Normal modalities response noted following removal of the modalities,    Rehab Potential Good   PT Frequency 3x / week   PT Duration 4 weeks   PT Treatment/Interventions ADLs/Self Care Home Management;Cryotherapy;Electrical Stimulation;Stair training;Functional mobility training;Gait training;Patient/family education;Neuromuscular re-education;Therapeutic exercise;Therapeutic activities;Manual techniques;Passive range of motion;Vasopneumatic Device   PT Next Visit Plan Continue with TKR protocol with modalities for pain and edema with focus on strengthening per MPT POC.   Consulted and Agree with Plan of Care Patient;Family member/caregiver   Family Member Consulted Neice      Patient will benefit from skilled therapeutic intervention in order to improve the following deficits and impairments:  Abnormal gait, Decreased activity tolerance, Decreased mobility, Decreased strength, Decreased range of motion, Difficulty walking, Increased edema, Pain  Visit Diagnosis: Stiffness of left knee, not elsewhere classified  Chronic pain of left knee  Localized edema     Problem List Patient Active Problem List   Diagnosis Date Noted  . Status post total left knee replacement 12/22/2016  . S/P TKR (total knee replacement) 12/22/2016  . Preoperative cardiovascular examination 10/14/2016  . New onset a-fib ([place]) 10/14/2016  . Persistent atrial fibrillation ([place]) 09/28/2016  . Morbid obesity ([place]) 08/18/2015  . Type 2 diabetes mellitus with diabetic polyneuropathy ([place]) 05/01/2015  . Endometrial cancer ([place]) 05/08/2013  . Essential hypertension 03/18/2011  . Hypothyroidism 03/18/2011  . Osteoarthritis 03/18/2011  . Osteopenia 03/18/2011  . Hyperlipidemia 03/18/2011  . Diabetic  neuropathy ([place]) 03/18/2011    Wynelle Fanny, PTA 01/17/2017, 3:24 PM  [facility] [address] [place], [place], [address] Phone: [phone]   Fax:  [phone]  Name: Debra Campbell MRN: [id number] Date of Birth: 1932/09/16

## 2017-01-19 ENCOUNTER — Encounter: Payer: Self-pay | Admitting: Physical Therapy

## 2017-01-19 ENCOUNTER — Ambulatory Visit: Payer: Medicare Other | Admitting: Physical Therapy

## 2017-01-19 DIAGNOSIS — M25662 Stiffness of left knee, not elsewhere classified: Secondary | ICD-10-CM | POA: Diagnosis not present

## 2017-01-19 DIAGNOSIS — G8929 Other chronic pain: Secondary | ICD-10-CM

## 2017-01-19 DIAGNOSIS — R600 Localized edema: Secondary | ICD-10-CM

## 2017-01-19 DIAGNOSIS — M25562 Pain in left knee: Secondary | ICD-10-CM | POA: Diagnosis not present

## 2017-01-19 NOTE — Therapy (Signed)
[facility] [place], [place], [address] Phone: [phone]   Fax:  [phone]  Physical Therapy Treatment  Patient Details  Name: Debra Campbell MRN: [id number] Date of Birth: 03-08-1932 Referring Provider: Latanya Maudlin MD.  Encounter Date: 01/19/2017      PT End of Session - 01/19/17 1044    Visit Number 8   Number of Visits 12   Date for PT Re-Evaluation 03/04/17   PT Start Time 1032   PT Stop Time 1119   PT Time Calculation (min) 47 min   Activity Tolerance Patient tolerated treatment well;Patient limited by fatigue   Behavior During Therapy [facility] for tasks assessed/performed      Past Medical History:  Diagnosis Date  . Arthritis    legs, lower back, hands  . Cancer ([place])    endometrial  . Diabetes mellitus   . Ectopic pregnancy   . Eczema   . Headache(784.0)    hx of  . History of kidney stones   . Hyperlipidemia    no meds  . Hypertension   . Hypothyroidism   . Persistent atrial fibrillation ([place]) 09/28/2016   Relatively new Dx: Asymptomatic.  Rate controlled without medication.  . SVD (spontaneous vaginal delivery)    x 3  . Thyroid disease     Past Surgical History:  Procedure Laterality Date  . ABDOMINAL HYSTERECTOMY Bilateral 06/26/2013   Procedure: TOTAL ABDOMINAL HYSTERECTOMY WITH BILATERAL SALPINGO OOPHERECTOMY WITH PELVIC LYMPHADNECTOMY;  Surgeon: Alvino Chapel, MD;  Location: [facility];  Service: Gynecology;  Laterality: Bilateral;  . BACK SURGERY  06/26/2013  . ECTOPIC PREGNANCY SURGERY    . HERNIA REPAIR    . HYSTEROSCOPY W/D&C N/A 04/26/2013   Procedure: DILATATION AND CURETTAGE /HYSTEROSCOPY;  Surgeon: Woodroe Mode, MD;  Location: [facility];  Service: Gynecology;  Laterality: N/A;  . JOINT REPLACEMENT    . LAPAROTOMY     for ectopic pregnancy  . LAPAROTOMY N/A 06/26/2013   Procedure: EXPLORATORY LAPAROTOMY;  Surgeon: Alvino Chapel, MD;  Location: [facility];  Service:  Gynecology;  Laterality: N/A;  . lt knee arthroscopy    . rt total hip replacement    . TONSILLECTOMY    . TOTAL KNEE ARTHROPLASTY Left 12/22/2016   Procedure: LEFT TOTAL KNEE ARTHROPLASTY;  Surgeon: Latanya Maudlin, MD;  Location: [facility];  Service: Orthopedics;  Laterality: Left;  . WISDOM TOOTH EXTRACTION      There were no vitals filed for this visit.      Subjective Assessment - 01/19/17 1043    Subjective No new complaints.   Patient Stated Goals Get out of pain and walk good.   Currently in Pain? Yes   Pain Score 2    Pain Location Knee   Pain Orientation Left   Pain Descriptors / Indicators Sore   Pain Type Surgical pain   Pain Onset 1 to 4 weeks ago            [facility] PT Assessment - 01/19/17 0001      Assessment   Medical Diagnosis Left total knee replacement.   Onset Date/Surgical Date 12/22/16   Next MD Visit 02/22/2017     Restrictions   Weight Bearing Restrictions No                     [facility] Adult PT Treatment/Exercise - 01/19/17 0001      Knee/Hip Exercises: Aerobic   Nustep L5, seat 9 x13 min  Knee/Hip Exercises: Standing   Forward Lunges Left;2 sets;10 reps;3 seconds   Forward Step Up Left;2 sets;10 reps;Hand Hold: 2;Step Height: 6"     Knee/Hip Exercises: Seated   Long Arc Quad Strengthening;Left;3 sets;10 reps;Weights   Long Arc Quad Weight 4 lbs.     Modalities   Modalities Passenger transport manager Location L knee   Electrical Stimulation Action IFC   Electrical Stimulation Parameters 1-10 hz x15 min   Electrical Stimulation Goals Edema;Pain     Vasopneumatic   Number Minutes Vasopneumatic  15 minutes   Vasopnuematic Location  Knee   Vasopneumatic Pressure Medium   Vasopneumatic Temperature  38     Manual Therapy   Manual Therapy Passive ROM;Soft tissue mobilization   Soft tissue mobilization L patella mobilizations to promote proper mobility   Passive  ROM PROM of L knee into flexion/extension with gentle holds at end range                  PT Short Term Goals - 01/03/17 1451      PT SHORT TERM GOAL #1   Title Independent with an initial HEP.   Time 2   Period Weeks   Status New     PT SHORT TERM GOAL #2   Title Full active left knee extension in order to normalize gait   Time 2   Period Weeks   Status New           PT Long Term Goals - 01/03/17 1500      PT LONG TERM GOAL #1   Title Independewnt with an advanced HEP.   Time 8   Period Weeks   Status New     PT LONG TERM GOAL #2   Title Active left knee flexion to 115 degrees+ so the patient can perform functional tasks and do so with pain not > 2-3/10.   Time 8   Period Weeks   Status New     PT LONG TERM GOAL #3   Title Increase left knee strength to a solid 4+/5 to provide good stability for accomplishment of functional activities   Time 8   Period Weeks   Status New     PT LONG TERM GOAL #4   Title Decrease edema to within 2.5 cms of non-affected side to assist with pain reduction and range of motion gains.   Time 8   Period Weeks   Status New     PT LONG TERM GOAL #5   Title Perform a reciprocating stair gait with one railing with pain not > 2-3/10.   Time 8   Period Weeks   Status New               Plan - 01/19/17 1111    Clinical Impression Statement Patient presented in clinic with low L knee pain but continues to have increased fatigue especially in standing exercises. Patient encouraged to use more LE strength than to pull with UEs. Able to complete increased resistance with increased repititions today with LAQ and no complaint. Limited with sup/inf direction of patella mobility but may be due to increased edema present around L knee. Normal modalities response noted following removal of the modalities.   Rehab Potential Good   PT Frequency 3x / week   PT Duration 4 weeks   PT Treatment/Interventions ADLs/Self Care Home  Management;Cryotherapy;Electrical Stimulation;Stair training;Functional mobility training;Gait training;Patient/family education;Neuromuscular re-education;Therapeutic exercise;Therapeutic activities;Manual techniques;Passive range of  motion;Vasopneumatic Device   PT Next Visit Plan Continue with TKR protocol with modalities for pain and edema with focus on strengthening per MPT POC.   Consulted and Agree with Plan of Care Patient;Family member/caregiver   Family Member Consulted Neice      Patient will benefit from skilled therapeutic intervention in order to improve the following deficits and impairments:  Abnormal gait, Decreased activity tolerance, Decreased mobility, Decreased strength, Decreased range of motion, Difficulty walking, Increased edema, Pain  Visit Diagnosis: Stiffness of left knee, not elsewhere classified  Chronic pain of left knee  Localized edema     Problem List Patient Active Problem List   Diagnosis Date Noted  . Status post total left knee replacement 12/22/2016  . S/P TKR (total knee replacement) 12/22/2016  . Preoperative cardiovascular examination 10/14/2016  . New onset a-fib ([place]) 10/14/2016  . Persistent atrial fibrillation ([place]) 09/28/2016  . Morbid obesity ([place]) 08/18/2015  . Type 2 diabetes mellitus with diabetic polyneuropathy ([place]) 05/01/2015  . Endometrial cancer ([place]) 05/08/2013  . Essential hypertension 03/18/2011  . Hypothyroidism 03/18/2011  . Osteoarthritis 03/18/2011  . Osteopenia 03/18/2011  . Hyperlipidemia 03/18/2011  . Diabetic neuropathy ([place]) 03/18/2011    Wynelle Fanny, PTA 01/19/2017, 11:23 AM  [facility] [address] [place], [place], [address] Phone: [phone]   Fax:  [phone]  Name: HAYES CARRUTHERS MRN: [id number] Date of Birth: Jul 10, 1932

## 2017-01-21 ENCOUNTER — Ambulatory Visit: Payer: Medicare Other | Admitting: Physical Therapy

## 2017-01-21 ENCOUNTER — Encounter: Payer: Self-pay | Admitting: Physical Therapy

## 2017-01-21 DIAGNOSIS — M25662 Stiffness of left knee, not elsewhere classified: Secondary | ICD-10-CM

## 2017-01-21 DIAGNOSIS — R600 Localized edema: Secondary | ICD-10-CM

## 2017-01-21 DIAGNOSIS — G8929 Other chronic pain: Secondary | ICD-10-CM

## 2017-01-21 DIAGNOSIS — M25562 Pain in left knee: Secondary | ICD-10-CM | POA: Diagnosis not present

## 2017-01-21 NOTE — Therapy (Signed)
[facility] [place], [place], [address] Phone: [phone]   Fax:  [phone]  Physical Therapy Treatment  Patient Details  Name: Debra Campbell MRN: [id number] Date of Birth: [birth year] Referring Provider: Latanya Maudlin MD.  Encounter Date: 01/21/2017      PT End of Session - 01/21/17 1034    Visit Number 9   Number of Visits 12   Date for PT Re-Evaluation 03/04/17   PT Start Time 1031   PT Stop Time 1125   PT Time Calculation (min) 54 min   Activity Tolerance Patient tolerated treatment well;Patient limited by fatigue   Behavior During Therapy [facility] for tasks assessed/performed      Past Medical History:  Diagnosis Date  . Arthritis    legs, lower back, hands  . Cancer ([place])    endometrial  . Diabetes mellitus   . Ectopic pregnancy   . Eczema   . Headache(784.0)    hx of  . History of kidney stones   . Hyperlipidemia    no meds  . Hypertension   . Hypothyroidism   . Persistent atrial fibrillation ([place]) 09/28/2016   Relatively new Dx: Asymptomatic.  Rate controlled without medication.  . SVD (spontaneous vaginal delivery)    x 3  . Thyroid disease     Past Surgical History:  Procedure Laterality Date  . ABDOMINAL HYSTERECTOMY Bilateral 06/26/2013   Procedure: TOTAL ABDOMINAL HYSTERECTOMY WITH BILATERAL SALPINGO OOPHERECTOMY WITH PELVIC LYMPHADNECTOMY;  Surgeon: Alvino Chapel, MD;  [facility];  Service: Gynecology;  Laterality: Bilateral;  . BACK SURGERY  06/26/2013  . ECTOPIC PREGNANCY SURGERY    . HERNIA REPAIR    . HYSTEROSCOPY W/D&C N/A 04/26/2013   Procedure: DILATATION AND CURETTAGE /HYSTEROSCOPY;  Surgeon: Woodroe Mode, MD;  Location: [facility];  Service: Gynecology;  Laterality: N/A;  . JOINT REPLACEMENT    . LAPAROTOMY     for ectopic pregnancy  . LAPAROTOMY N/A 06/26/2013   Procedure: EXPLORATORY LAPAROTOMY;  Surgeon: Alvino Chapel, MD;  [facility];  Service:  Gynecology;  Laterality: N/A;  . lt knee arthroscopy    . rt total hip replacement    . TONSILLECTOMY    . TOTAL KNEE ARTHROPLASTY Left 12/22/2016   Procedure: LEFT TOTAL KNEE ARTHROPLASTY;  Surgeon: Latanya Maudlin, MD;  [facility];  Service: Orthopedics;  Laterality: Left;  . WISDOM TOOTH EXTRACTION      There were no vitals filed for this visit.      Subjective Assessment - 01/21/17 1033    Subjective Reports that the discomfort in going up into her leg today. Thinks the damp weather is affecting her pain.   Patient Stated Goals Get out of pain and walk good.   Currently in Pain? Yes   Pain Score 4    Pain Location Knee   Pain Orientation Left   Pain Descriptors / Indicators Aching;Sore   Pain Type Surgical pain   Pain Onset 1 to 4 weeks ago            [facility] PT Assessment - 01/21/17 0001      Assessment   Medical Diagnosis Left total knee replacement.   Onset Date/Surgical Date 12/22/16   Next MD Visit 02/22/2017     Restrictions   Weight Bearing Restrictions No                     [facility] Adult PT Treatment/Exercise - 01/21/17 0001  Knee/Hip Exercises: Aerobic   Nustep L6, seat 8 x12 min     Knee/Hip Exercises: Standing   Forward Lunges Left;2 sets;10 reps;3 seconds   Forward Step Up Left;1 set;10 reps;Hand Hold: 2;Step Height: 6"   Rocker Board 3 minutes     Knee/Hip Exercises: Seated   Long Arc Quad Strengthening;Left;3 sets;10 reps;Weights   Long Arc Quad Weight 4 lbs.     Modalities   Modalities Passenger transport manager Location L knee   Electrical Stimulation Action IFC   Electrical Stimulation Parameters 1-10 hz x15 min   Electrical Stimulation Goals Edema;Pain     Vasopneumatic   Number Minutes Vasopneumatic  15 minutes   Vasopnuematic Location  Knee   Vasopneumatic Pressure Medium   Vasopneumatic Temperature  38     Manual Therapy   Manual Therapy Passive  ROM   Passive ROM PROM of L knee into flexion/extension with gentle holds at end range                  PT Short Term Goals - 01/03/17 1451      PT SHORT TERM GOAL #1   Title Independent with an initial HEP.   Time 2   Period Weeks   Status New     PT SHORT TERM GOAL #2   Title Full active left knee extension in order to normalize gait   Time 2   Period Weeks   Status New           PT Long Term Goals - 01/03/17 1500      PT LONG TERM GOAL #1   Title Independewnt with an advanced HEP.   Time 8   Period Weeks   Status New     PT LONG TERM GOAL #2   Title Active left knee flexion to 115 degrees+ so the patient can perform functional tasks and do so with pain not > 2-3/10.   Time 8   Period Weeks   Status New     PT LONG TERM GOAL #3   Title Increase left knee strength to a solid 4+/5 to provide good stability for accomplishment of functional activities   Time 8   Period Weeks   Status New     PT LONG TERM GOAL #4   Title Decrease edema to within 2.5 cms of non-affected side to assist with pain reduction and range of motion gains.   Time 8   Period Weeks   Status New     PT LONG TERM GOAL #5   Title Perform a reciprocating stair gait with one railing with pain not > 2-3/10.   Time 8   Period Weeks   Status New               Plan - 01/21/17 1127    Clinical Impression Statement Patient continues to tolerate treatments fairly well although she continues to fatigue quickly especially with standing exercises. Patient able to complete forward step ups with trunk flexion which may be due to fatigue with all exercises especially standing exercises. Normal modalities response noted following removal of the modalities.   Rehab Potential Good   PT Frequency 3x / week   PT Duration 4 weeks   PT Treatment/Interventions ADLs/Self Care Home Management;Cryotherapy;Electrical Stimulation;Stair training;Functional mobility training;Gait  training;Patient/family education;Neuromuscular re-education;Therapeutic exercise;Therapeutic activities;Manual techniques;Passive range of motion;Vasopneumatic Device   PT Next Visit Plan Continue with TKR protocol with modalities for pain and  edema with focus on strengthening per MPT POC. MD NOTE NEXT TREATMENT.   Consulted and Agree with Plan of Care Patient;Family member/caregiver   Family Member Consulted Neice      Patient will benefit from skilled therapeutic intervention in order to improve the following deficits and impairments:  Abnormal gait, Decreased activity tolerance, Decreased mobility, Decreased strength, Decreased range of motion, Difficulty walking, Increased edema, Pain  Visit Diagnosis: Stiffness of left knee, not elsewhere classified  Chronic pain of left knee  Localized edema     Problem List Patient Active Problem List   Diagnosis Date Noted  . Status post total left knee replacement 12/22/2016  . S/P TKR (total knee replacement) 12/22/2016  . Preoperative cardiovascular examination 10/14/2016  . New onset a-fib ([place]) 10/14/2016  . Persistent atrial fibrillation ([place]) 09/28/2016  . Morbid obesity ([place]) 08/18/2015  . Type 2 diabetes mellitus with diabetic polyneuropathy ([place]) 05/01/2015  . Endometrial cancer ([place]) 05/08/2013  . Essential hypertension 03/18/2011  . Hypothyroidism 03/18/2011  . Osteoarthritis 03/18/2011  . Osteopenia 03/18/2011  . Hyperlipidemia 03/18/2011  . Diabetic neuropathy ([place]) 03/18/2011    Wynelle Fanny, PTA 01/21/2017, 11:38 AM  [facility] [address] [place], [place], [address] Phone: [phone]   Fax:  [phone]  Name: Debra Campbell MRN: [id number] Date of Birth: 28-Dec-1931

## 2017-01-24 ENCOUNTER — Ambulatory Visit: Payer: Medicare Other | Admitting: *Deleted

## 2017-01-24 DIAGNOSIS — G8929 Other chronic pain: Secondary | ICD-10-CM | POA: Diagnosis not present

## 2017-01-24 DIAGNOSIS — M25662 Stiffness of left knee, not elsewhere classified: Secondary | ICD-10-CM | POA: Diagnosis not present

## 2017-01-24 DIAGNOSIS — M25562 Pain in left knee: Secondary | ICD-10-CM

## 2017-01-24 DIAGNOSIS — R600 Localized edema: Secondary | ICD-10-CM

## 2017-01-24 NOTE — Therapy (Signed)
[facility] [place], [place], [address] Phone: [phone]   Fax:  [phone]  Physical Therapy Treatment  Patient Details  Name: Debra Campbell MRN: [id number] Date of Birth: 29-Mar-1932 Referring Provider: Latanya Maudlin MD.  Encounter Date: 01/24/2017      PT End of Session - 01/24/17 1204    Visit Number 10   Number of Visits 12   Date for PT Re-Evaluation 03/04/17   PT Start Time 1115   PT Stop Time 1218   PT Time Calculation (min) 63 min      Past Medical History:  Diagnosis Date  . Arthritis    legs, lower back, hands  . Cancer ([place])    endometrial  . Diabetes mellitus   . Ectopic pregnancy   . Eczema   . Headache(784.0)    hx of  . History of kidney stones   . Hyperlipidemia    no meds  . Hypertension   . Hypothyroidism   . Persistent atrial fibrillation ([place]) 09/28/2016   Relatively new Dx: Asymptomatic.  Rate controlled without medication.  . SVD (spontaneous vaginal delivery)    x 3  . Thyroid disease     Past Surgical History:  Procedure Laterality Date  . ABDOMINAL HYSTERECTOMY Bilateral 06/26/2013   Procedure: TOTAL ABDOMINAL HYSTERECTOMY WITH BILATERAL SALPINGO OOPHERECTOMY WITH PELVIC LYMPHADNECTOMY;  Surgeon: Alvino Chapel, MD;  [facility];  Service: Gynecology;  Laterality: Bilateral;  . BACK SURGERY  06/26/2013  . ECTOPIC PREGNANCY SURGERY    . HERNIA REPAIR    . HYSTEROSCOPY W/D&C N/A 04/26/2013   Procedure: DILATATION AND CURETTAGE /HYSTEROSCOPY;  Surgeon: Woodroe Mode, MD;  Location: [facility];  Service: Gynecology;  Laterality: N/A;  . JOINT REPLACEMENT    . LAPAROTOMY     for ectopic pregnancy  . LAPAROTOMY N/A 06/26/2013   Procedure: EXPLORATORY LAPAROTOMY;  Surgeon: Alvino Chapel, MD;  [facility];  Service: Gynecology;  Laterality: N/A;  . lt knee arthroscopy    . rt total hip replacement    . TONSILLECTOMY    . TOTAL KNEE ARTHROPLASTY Left  12/22/2016   Procedure: LEFT TOTAL KNEE ARTHROPLASTY;  Surgeon: Latanya Maudlin, MD;  [facility];  Service: Orthopedics;  Laterality: Left;  . WISDOM TOOTH EXTRACTION      There were no vitals filed for this visit.      Subjective Assessment - 01/24/17 1121    Subjective Reports that the discomfort in going up into her leg today. Thinks the damp weather is affecting her pain.  2/10   Patient Stated Goals Get out of pain and walk good.   Currently in Pain? Yes   Pain Score 2    Pain Location Knee   Pain Orientation Left   Pain Descriptors / Indicators Aching;Sore   Pain Type Surgical pain   Pain Onset 1 to 4 weeks ago   Pain Frequency Constant                         [facility] Adult PT Treatment/Exercise - 01/24/17 0001      Knee/Hip Exercises: Aerobic   Nustep L6, seat 8 x15 min     Knee/Hip Exercises: Standing   Forward Lunges Left;2 sets;10 reps;3 seconds  Flexion to 105 degrees   Forward Step Up Left;10 reps;Hand Hold: 2;Step Height: 6";2 sets   Rocker Board 3 minutes     Knee/Hip Exercises: Seated   Long Arc  Quad Strengthening;Left;10 reps;Weights;4 sets   [employer] Weight 4 lbs.     Modalities   Modalities Passenger transport manager Location L knee IFC 1-10hz  x 15 mins   Electrical Stimulation Goals Edema;Pain     Vasopneumatic   Number Minutes Vasopneumatic  15 minutes   Vasopnuematic Location  Knee   Vasopneumatic Pressure Medium   Vasopneumatic Temperature  38     Manual Therapy   Manual Therapy --   Passive ROM --                  PT Short Term Goals - 01/03/17 1451      PT SHORT TERM GOAL #1   Title Independent with an initial HEP.   Time 2   Period Weeks   Status New     PT SHORT TERM GOAL #2   Title Full active left knee extension in order to normalize gait   Time 2   Period Weeks   Status New           PT Long Term Goals - 01/03/17 1500       PT LONG TERM GOAL #1   Title Independewnt with an advanced HEP.   Time 8   Period Weeks   Status New     PT LONG TERM GOAL #2   Title Active left knee flexion to 115 degrees+ so the patient can perform functional tasks and do so with pain not > 2-3/10.   Time 8   Period Weeks   Status New     PT LONG TERM GOAL #3   Title Increase left knee strength to a solid 4+/5 to provide good stability for accomplishment of functional activities   Time 8   Period Weeks   Status New     PT LONG TERM GOAL #4   Title Decrease edema to within 2.5 cms of non-affected side to assist with pain reduction and range of motion gains.   Time 8   Period Weeks   Status New     PT LONG TERM GOAL #5   Title Perform a reciprocating stair gait with one railing with pain not > 2-3/10.   Time 8   Period Weeks   Status New               Plan - 01/24/17 1223    Clinical Impression Statement Pt arrived to clinic in good spirits and feels that LT knee is doing fairly well. She will F/U with MD tomorrow for 4 week check-up She was able to reach PROM 0-105 degrees today and did well with all therex and act.'s for LT knee rehab.[name] FOTO taken for 10th visit 48% limitation   Rehab Potential Good   PT Frequency 3x / week   PT Duration 4 weeks   PT Treatment/Interventions ADLs/Self Care Home Management;Cryotherapy;Electrical Stimulation;Stair training;Functional mobility training;Gait training;Patient/family education;Neuromuscular re-education;Therapeutic exercise;Therapeutic activities;Manual techniques;Passive range of motion;Vasopneumatic Device   PT Next Visit Plan Continue with TKR protocol with modalities for pain and edema with focus on strengthening per MPT POC. MD NOTE NEXT TREATMENT.   Consulted and Agree with Plan of Care Patient;Family member/caregiver   Family Member Consulted Neice      Patient will benefit from skilled therapeutic intervention in order to improve the following deficits  and impairments:  Abnormal gait, Decreased activity tolerance, Decreased mobility, Decreased strength, Decreased range of motion, Difficulty walking, Increased edema, Pain  Visit  Diagnosis: Stiffness of left knee, not elsewhere classified  Chronic pain of left knee  Localized edema       G-Codes - 02-05-2017 1204    Functional Assessment Tool Used (Outpatient Only) 10th visit FOTO 48%   limitation      Problem List Patient Active Problem List   Diagnosis Date Noted  . Status post total left knee replacement 12/22/2016  . S/P TKR (total knee replacement) 12/22/2016  . Preoperative cardiovascular examination 10/14/2016  . New onset a-fib ([place]) 10/14/2016  . Persistent atrial fibrillation ([place]) 09/28/2016  . Morbid obesity ([place]) 08/18/2015  . Type 2 diabetes mellitus with diabetic polyneuropathy ([place]) 05/01/2015  . Endometrial cancer ([place]) 05/08/2013  . Essential hypertension 03/18/2011  . Hypothyroidism 03/18/2011  . Osteoarthritis 03/18/2011  . Osteopenia 03/18/2011  . Hyperlipidemia 03/18/2011  . Diabetic neuropathy ([place]) 03/18/2011    RAMSEUR,CHRIS, PTA 02-05-17, 12:27 PM  [facility] [address] [place], [place], [address] Phone: [phone]   Fax:  [phone]  Name: Debra Campbell MRN: [id number] Date of Birth: Jul 14, 1932

## 2017-01-25 DIAGNOSIS — Z471 Aftercare following joint replacement surgery: Secondary | ICD-10-CM | POA: Diagnosis not present

## 2017-01-25 DIAGNOSIS — M1712 Unilateral primary osteoarthritis, left knee: Secondary | ICD-10-CM | POA: Diagnosis not present

## 2017-01-25 DIAGNOSIS — Z96652 Presence of left artificial knee joint: Secondary | ICD-10-CM | POA: Diagnosis not present

## 2017-01-26 ENCOUNTER — Ambulatory Visit: Payer: Medicare Other | Admitting: Physical Therapy

## 2017-01-26 ENCOUNTER — Encounter: Payer: Self-pay | Admitting: Physical Therapy

## 2017-01-26 DIAGNOSIS — M25562 Pain in left knee: Secondary | ICD-10-CM

## 2017-01-26 DIAGNOSIS — R600 Localized edema: Secondary | ICD-10-CM | POA: Diagnosis not present

## 2017-01-26 DIAGNOSIS — M25662 Stiffness of left knee, not elsewhere classified: Secondary | ICD-10-CM | POA: Diagnosis not present

## 2017-01-26 DIAGNOSIS — G8929 Other chronic pain: Secondary | ICD-10-CM | POA: Diagnosis not present

## 2017-01-26 NOTE — Therapy (Signed)
[facility] [place], [place], [address] Phone: [phone]   Fax:  [phone]  Physical Therapy Treatment  Patient Details  Name: Debra Campbell MRN: [id number] Date of Birth: [birth year] Referring Provider: Latanya Maudlin MD.  Encounter Date: 01/26/2017      PT End of Session - 01/26/17 1403    Visit Number 11   Number of Visits 12   Date for PT Re-Evaluation 03/04/17   PT Start Time 1320   PT Stop Time 1405   PT Time Calculation (min) 45 min   Activity Tolerance Patient tolerated treatment well   Behavior During Therapy [facility] for tasks assessed/performed      Past Medical History:  Diagnosis Date  . Arthritis    legs, lower back, hands  . Cancer ([place])    endometrial  . Diabetes mellitus   . Ectopic pregnancy   . Eczema   . Headache(784.0)    hx of  . History of kidney stones   . Hyperlipidemia    no meds  . Hypertension   . Hypothyroidism   . Persistent atrial fibrillation ([place]) 09/28/2016   Relatively new Dx: Asymptomatic.  Rate controlled without medication.  . SVD (spontaneous vaginal delivery)    x 3  . Thyroid disease     Past Surgical History:  Procedure Laterality Date  . ABDOMINAL HYSTERECTOMY Bilateral 06/26/2013   Procedure: TOTAL ABDOMINAL HYSTERECTOMY WITH BILATERAL SALPINGO OOPHERECTOMY WITH PELVIC LYMPHADNECTOMY;  Surgeon: Alvino Chapel, MD;  Location: [facility];  Service: Gynecology;  Laterality: Bilateral;  . BACK SURGERY  06/26/2013  . ECTOPIC PREGNANCY SURGERY    . HERNIA REPAIR    . HYSTEROSCOPY W/D&C N/A 04/26/2013   Procedure: DILATATION AND CURETTAGE /HYSTEROSCOPY;  Surgeon: Woodroe Mode, MD;  Location: [facility];  Service: Gynecology;  Laterality: N/A;  . JOINT REPLACEMENT    . LAPAROTOMY     for ectopic pregnancy  . LAPAROTOMY N/A 06/26/2013   Procedure: EXPLORATORY LAPAROTOMY;  Surgeon: Alvino Chapel, MD;  Location: [facility];  Service: Gynecology;  Laterality:  N/A;  . lt knee arthroscopy    . rt total hip replacement    . TONSILLECTOMY    . TOTAL KNEE ARTHROPLASTY Left 12/22/2016   Procedure: LEFT TOTAL KNEE ARTHROPLASTY;  Surgeon: Latanya Maudlin, MD;  Location: [facility];  Service: Orthopedics;  Laterality: Left;  . WISDOM TOOTH EXTRACTION      There were no vitals filed for this visit.      Subjective Assessment - 01/26/17 1310    Subjective Pt arriving to therapy and reporting pain/discomfort in her left thigh/leg.    Patient Stated Goals Get out of pain and walk good.   Currently in Pain? Yes   Pain Score 2    Pain Location Knee   Pain Orientation Left   Pain Type Surgical pain   Pain Onset 1 to 4 weeks ago   Pain Frequency Constant   Aggravating Factors  walking, bending   Pain Relieving Factors meds            [facility] PT Assessment - 01/26/17 0001      Assessment   Medical Diagnosis left total knee replacement   Onset Date/Surgical Date 12/22/16   Hand Dominance Right   Next MD Visit 02/22/17     Restrictions   Weight Bearing Restrictions No     Balance Screen   Has the patient fallen in the past 6 months No     AROM  Overall AROM Comments Left Knee ROM: 6-108                      [facility] Adult PT Treatment/Exercise - 01/26/17 0001      Knee/Hip Exercises: Aerobic   Nustep L6 seat 8, 15 minutes     Knee/Hip Exercises: Standing   Forward Lunges Left;2 sets;10 reps;3 seconds  Flexion to 105 degrees   Forward Step Up Left;10 reps;Hand Hold: 2;Step Height: 6";2 sets   Other Standing Knee Exercises Standing on BOSU ball dome up for 3 minutes with CGA and intermittent UE support     Knee/Hip Exercises: Seated   Long Arc Quad Strengthening;Left;10 reps;Weights;4 sets   [employer] Weight 5 lbs.     Modalities   Modalities Passenger transport manager Location Left knee    Electrical Stimulation Action IFC   Electrical Stimulation  Parameters 1-10 HZ x 15 minutes   Electrical Stimulation Goals Edema;Pain     Vasopneumatic   Number Minutes Vasopneumatic  15 minutes   Vasopnuematic Location  Knee   Vasopneumatic Pressure Medium   Vasopneumatic Temperature  34                PT Education - 01/26/17 1403    Education Details reviewed exercises with pt's niece, encouraged walking program for home.    Person(s) Educated Patient   Methods Explanation   Comprehension Verbalized understanding          PT Short Term Goals - 01/03/17 1451      PT SHORT TERM GOAL #1   Title Independent with an initial HEP.   Time 2   Period Weeks   Status New     PT SHORT TERM GOAL #2   Title Full active left knee extension in order to normalize gait   Time 2   Period Weeks   Status New           PT Long Term Goals - 01/26/17 1411      PT LONG TERM GOAL #1   Title Independewnt with an advanced HEP.   Time 8   Period Weeks   Status Achieved     PT LONG TERM GOAL #2   Title Active left knee flexion to 115 degrees+ so the patient can perform functional tasks and do so with pain not > 2-3/10.   Status Partially Met     PT LONG TERM GOAL #3   Title Increase left knee strength to a solid 4+/5 to provide good stability for accomplishment of functional activities   Time 8   Period Weeks     PT LONG TERM GOAL #4   Title Decrease edema to within 2.5 cms of non-affected side to assist with pain reduction and range of motion gains.   Time 8   Period Weeks   Status New     PT LONG TERM GOAL #5   Title Perform a reciprocating stair gait with one railing with pain not > 2-3/10.   Time 8   Period Weeks   Status Achieved               Plan - 01/26/17 1404    Clinical Impression Statement Pt arriving to clinic today with her niece. Pt reporting doing all her home exercises daily. Pt tolerating today's treatment well adding more dynamic stepping and balance exercises. Pt's ROM has improved and pt  progressing toward all goals  set at initial evalaution.Pt reported that her next visit was going to be her last per MD.    Rehab Potential Good   PT Frequency 3x / week   PT Duration 4 weeks   PT Treatment/Interventions ADLs/Self Care Home Management;Cryotherapy;Electrical Stimulation;Stair training;Functional mobility training;Gait training;Patient/family education;Neuromuscular re-education;Therapeutic exercise;Therapeutic activities;Manual techniques;Passive range of motion;Vasopneumatic Device   PT Next Visit Plan Continue with TKR protocol, balance, gait training and hip strengthening   Consulted and Agree with Plan of Care Patient   Family Member Consulted Neice      Patient will benefit from skilled therapeutic intervention in order to improve the following deficits and impairments:  Abnormal gait, Decreased activity tolerance, Decreased mobility, Decreased strength, Decreased range of motion, Difficulty walking, Increased edema, Pain  Visit Diagnosis: Stiffness of left knee, not elsewhere classified  Chronic pain of left knee  Localized edema     Problem List Patient Active Problem List   Diagnosis Date Noted  . Status post total left knee replacement 12/22/2016  . S/P TKR (total knee replacement) 12/22/2016  . Preoperative cardiovascular examination 10/14/2016  . New onset a-fib ([place]) 10/14/2016  . Persistent atrial fibrillation ([place]) 09/28/2016  . Morbid obesity ([place]) 08/18/2015  . Type 2 diabetes mellitus with diabetic polyneuropathy ([place]) 05/01/2015  . Endometrial cancer ([place]) 05/08/2013  . Essential hypertension 03/18/2011  . Hypothyroidism 03/18/2011  . Osteoarthritis 03/18/2011  . Osteopenia 03/18/2011  . Hyperlipidemia 03/18/2011  . Diabetic neuropathy ([place]) 03/18/2011    Oretha Caprice, MPT 01/26/2017, 2:23 PM  [facility] [place], [place], [address] Phone: [phone]   Fax:   [phone]  Name: KAYRA CROWELL MRN: [id number] Date of Birth: [birth year]

## 2017-01-28 ENCOUNTER — Ambulatory Visit: Payer: Medicare Other | Attending: Orthopedic Surgery | Admitting: *Deleted

## 2017-01-28 DIAGNOSIS — R600 Localized edema: Secondary | ICD-10-CM | POA: Diagnosis not present

## 2017-01-28 DIAGNOSIS — M25662 Stiffness of left knee, not elsewhere classified: Secondary | ICD-10-CM | POA: Insufficient documentation

## 2017-01-28 DIAGNOSIS — M25562 Pain in left knee: Secondary | ICD-10-CM | POA: Insufficient documentation

## 2017-01-28 DIAGNOSIS — G8929 Other chronic pain: Secondary | ICD-10-CM | POA: Diagnosis not present

## 2017-01-28 NOTE — Therapy (Signed)
[facility] [place], [place], [address] Phone: [phone]   Fax:  [phone]  Physical Therapy Treatment  Patient Details  Name: Debra Campbell MRN: [id number] Date of Birth: 08-27-1932 Referring Provider: Latanya Maudlin MD.  Encounter Date: 01/28/2017      PT End of Session - 01/28/17 1214    Visit Number 12   Number of Visits 12   Date for PT Re-Evaluation 03/04/17   PT Start Time 1200   PT Stop Time 1255   PT Time Calculation (min) 55 min      Past Medical History:  Diagnosis Date  . Arthritis    legs, lower back, hands  . Cancer ([place])    endometrial  . Diabetes mellitus   . Ectopic pregnancy   . Eczema   . Headache(784.0)    hx of  . History of kidney stones   . Hyperlipidemia    no meds  . Hypertension   . Hypothyroidism   . Persistent atrial fibrillation ([place]) 09/28/2016   Relatively new Dx: Asymptomatic.  Rate controlled without medication.  . SVD (spontaneous vaginal delivery)    x 3  . Thyroid disease     Past Surgical History:  Procedure Laterality Date  . ABDOMINAL HYSTERECTOMY Bilateral 06/26/2013   Procedure: TOTAL ABDOMINAL HYSTERECTOMY WITH BILATERAL SALPINGO OOPHERECTOMY WITH PELVIC LYMPHADNECTOMY;  Surgeon: Alvino Chapel, MD;  Location: [facility];  Service: Gynecology;  Laterality: Bilateral;  . BACK SURGERY  06/26/2013  . ECTOPIC PREGNANCY SURGERY    . HERNIA REPAIR    . HYSTEROSCOPY W/D&C N/A 04/26/2013   Procedure: DILATATION AND CURETTAGE /HYSTEROSCOPY;  Surgeon: Woodroe Mode, MD;  Location: [facility];  Service: Gynecology;  Laterality: N/A;  . JOINT REPLACEMENT    . LAPAROTOMY     for ectopic pregnancy  . LAPAROTOMY N/A 06/26/2013   Procedure: EXPLORATORY LAPAROTOMY;  Surgeon: Alvino Chapel, MD;  Location: [facility];  Service: Gynecology;  Laterality: N/A;  . lt knee arthroscopy    . rt total hip replacement    . TONSILLECTOMY    . TOTAL KNEE ARTHROPLASTY Left  12/22/2016   Procedure: LEFT TOTAL KNEE ARTHROPLASTY;  Surgeon: Latanya Maudlin, MD;  Location: [facility];  Service: Orthopedics;  Laterality: Left;  . WISDOM TOOTH EXTRACTION      There were no vitals filed for this visit.      Subjective Assessment - 01/28/17 1213    Subjective Went to MD on Tuesday and Dr was pleased with status. DC from PT after today   Patient Stated Goals Get out of pain and walk good.   Currently in Pain? Yes   Pain Score 2    Pain Location Knee   Pain Orientation Left   Pain Descriptors / Indicators Aching;Sore   Pain Type Surgical pain   Pain Onset 1 to 4 weeks ago   Pain Frequency Constant                         [facility] Adult PT Treatment/Exercise - 01/28/17 0001      Knee/Hip Exercises: Aerobic   Nustep L6 seat 8, 15 minutes     Knee/Hip Exercises: Standing   Forward Lunges Left;2 sets;10 reps;3 seconds  Flexion to 105 degrees   Forward Step Up Left;10 reps;Hand Hold: 2;Step Height: Landscape architect Board 3 minutes     Knee/Hip Exercises: Seated   Long Arc Quad Strengthening;Left;10 reps;Weights;4 sets  Long Arc Quad Weight 5 lbs.     Modalities   Modalities Passenger transport manager Location L knee IFC 1-10hz  x 15 mins   Electrical Stimulation Goals Edema;Pain     Vasopneumatic   Number Minutes Vasopneumatic  15 minutes   Vasopnuematic Location  Knee   Vasopneumatic Pressure Medium   Vasopneumatic Temperature  34                  PT Short Term Goals - 01/28/17 1215      PT SHORT TERM GOAL #1   Title Independent with an initial HEP.   Time 2   Period Weeks   Status Achieved     PT SHORT TERM GOAL #2   Title Full active left knee extension in order to normalize gait   Time 2   Period Weeks   Status Achieved           PT Long Term Goals - 01/28/17 1216      PT LONG TERM GOAL #1   Title Independewnt with an advanced HEP.   Status  Achieved     PT LONG TERM GOAL #2   Title Active left knee flexion to 115 degrees+ so the patient can perform functional tasks and do so with pain not > 2-3/10.   Time 8   Period Weeks   Status Achieved     PT LONG TERM GOAL #3   Title Increase left knee strength to a solid 4+/5 to provide good stability for accomplishment of functional activities   Time 8   Period Weeks   Status Achieved     PT LONG TERM GOAL #4   Title Decrease edema to within 2.5 cms of non-affected side to assist with pain reduction and range of motion gains.   Time 8   Period Weeks   Status Achieved     PT LONG TERM GOAL #5   Title Perform a reciprocating stair gait with one railing with pain not > 2-3/10.   Time 8   Period Weeks   Status Achieved               Plan - 01/28/17 1244    Clinical Impression Statement Pt went to see MD  Tuesday and he was pleased with her status and stated she could be DC from PT at this time. She has Met all goals and has done great   Rehab Potential Good   PT Frequency 3x / week   PT Duration 4 weeks   PT Treatment/Interventions ADLs/Self Care Home Management;Cryotherapy;Electrical Stimulation;Stair training;Functional mobility training;Gait training;Patient/family education;Neuromuscular re-education;Therapeutic exercise;Therapeutic activities;Manual techniques;Passive range of motion;Vasopneumatic Device   PT Next Visit Plan DC as per MD   Consulted and Agree with Plan of Care Patient      Patient will benefit from skilled therapeutic intervention in order to improve the following deficits and impairments:  Abnormal gait, Decreased activity tolerance, Decreased mobility, Decreased strength, Decreased range of motion, Difficulty walking, Increased edema, Pain  Visit Diagnosis: Stiffness of left knee, not elsewhere classified  Chronic pain of left knee  Localized edema     Problem List Patient Active Problem List   Diagnosis Date Noted  . Status post  total left knee replacement 12/22/2016  . S/P TKR (total knee replacement) 12/22/2016  . Preoperative cardiovascular examination 10/14/2016  . New onset a-fib ([place]) 10/14/2016  . Persistent atrial fibrillation ([place]) 09/28/2016  . Morbid obesity ([place])  08/18/2015  . Type 2 diabetes mellitus with diabetic polyneuropathy ([place]) 05/01/2015  . Endometrial cancer ([place]) 05/08/2013  . Essential hypertension 03/18/2011  . Hypothyroidism 03/18/2011  . Osteoarthritis 03/18/2011  . Osteopenia 03/18/2011  . Hyperlipidemia 03/18/2011  . Diabetic neuropathy ([place]) 03/18/2011    RAMSEUR,CHRIS, PTA 01/28/2017, 12:58 PM  [facility] [address] [place], [place], [address] Phone: [phone]   Fax:  [phone]  Name: Debra Campbell MRN: [id number] Date of Birth: 1931/12/17  PHYSICAL THERAPY DISCHARGE SUMMARY  Visits from Start of Care: 12.  Current functional level related to goals / functional outcomes: See above.   Remaining deficits: All goals met.   Education / Equipment: HEP. Plan: Patient agrees to discharge.  Patient goals were met. Patient is being discharged due to the physician's request.  ?????        Mali Applegate MPT

## 2017-02-17 DIAGNOSIS — Z96652 Presence of left artificial knee joint: Secondary | ICD-10-CM | POA: Diagnosis not present

## 2017-02-17 DIAGNOSIS — Z471 Aftercare following joint replacement surgery: Secondary | ICD-10-CM | POA: Diagnosis not present

## 2017-02-21 ENCOUNTER — Other Ambulatory Visit: Payer: Self-pay | Admitting: Nurse Practitioner

## 2017-02-21 DIAGNOSIS — I10 Essential (primary) hypertension: Secondary | ICD-10-CM

## 2017-02-23 ENCOUNTER — Encounter: Payer: Self-pay | Admitting: Nurse Practitioner

## 2017-02-23 ENCOUNTER — Ambulatory Visit (INDEPENDENT_AMBULATORY_CARE_PROVIDER_SITE_OTHER): Payer: Medicare Other | Admitting: Nurse Practitioner

## 2017-02-23 VITALS — BP 130/81 | HR 104 | Temp 97.5°F | Ht 64.0 in | Wt 203.0 lb

## 2017-02-23 DIAGNOSIS — B372 Candidiasis of skin and nail: Secondary | ICD-10-CM | POA: Diagnosis not present

## 2017-02-23 DIAGNOSIS — K521 Toxic gastroenteritis and colitis: Secondary | ICD-10-CM | POA: Diagnosis not present

## 2017-02-23 MED ORDER — NYSTATIN 100000 UNIT/GM EX CREA: 1.0000 "application " | TOPICAL_CREAM | Freq: Two times a day (BID) | CUTANEOUS | 0 refills | Status: DC

## 2017-02-23 NOTE — Patient Instructions (Signed)
Skin Yeast Infection Skin yeast infection is a condition in which there is an overgrowth of yeast (candida) that normally lives on the skin. This condition usually occurs in areas of the skin that are constantly warm and moist, such as the armpits or the groin. What are the causes? This condition is caused by a change in the normal balance of the yeast and bacteria that live on the skin. What increases the risk? This condition is more likely to develop in:  People who are obese.  Pregnant women.  Women who take birth control pills.  People who have diabetes.  People who take antibiotic medicines.  People who take steroid medicines.  People who are malnourished.  People who have a weak defense (immune) system.  People who are 54 years of age or older. What are the signs or symptoms? Symptoms of this condition include:  A red, swollen area of the skin.  Bumps on the skin.  Itchiness. How is this diagnosed? This condition is diagnosed with a medical history and physical exam. Your health care provider may check for yeast by taking light scrapings of the skin to be viewed under a microscope. How is this treated? This condition is treated with medicine. Medicines may be prescribed or be available over-the-counter. The medicines may be:  Taken by mouth (orally).  Applied as a cream. Follow these instructions at home:  Take or apply over-the-counter and prescription medicines only as told by your health care provider.  Eat more yogurt. This may help to keep your yeast infection from returning.  Maintain a healthy weight. If you need help losing weight, talk with your health care provider.  Keep your skin clean and dry.  If you have diabetes, keep your blood sugar under control. Contact a health care provider if:  Your symptoms go away and then return.  Your symptoms do not get better with treatment.  Your symptoms get worse.  Your rash spreads.  You have a fever  or chills.  You have new symptoms.  You have new warmth or redness of your skin. This information is not intended to replace advice given to you by your health care provider. Make sure you discuss any questions you have with your health care provider. Document Released: 08/03/2011 Document Revised: 07/11/2016 Document Reviewed: 05/19/2015 Elsevier Interactive Patient Education  2017 [employer].

## 2017-02-23 NOTE — Progress Notes (Signed)
   Subjective:    Patient ID: Debra Campbell, female    DOB: Nov 04, 1932, 83 y.o.   MRN: [id number]  HPI Patient comes in with 2 complaints: - diarrhea- she has had for quite sometime but it is really starting to bother her. Thinks it is coming from her metformin. She takes 1000mg  BID. Last HGBA1c was 6.2% - rash bil groin area- burns and itches. Kenalog cream she has been using seems to be making it worse.    Review of Systems  Constitutional: Negative.   Respiratory: Negative.   Cardiovascular: Negative.   Gastrointestinal: Positive for diarrhea.  Genitourinary: Negative.   Neurological: Negative.   Psychiatric/Behavioral: Negative.   All other systems reviewed and are negative.      Objective:   Physical Exam  Constitutional: She is oriented to person, place, and time. She appears well-developed and well-nourished. No distress.  Cardiovascular: Normal rate and regular rhythm.   Pulmonary/Chest: Effort normal and breath sounds normal.  Neurological: She is alert and oriented to person, place, and time.  Skin: Skin is warm.  Erythematous moist appearing rash bil groin area  Psychiatric: She has a normal mood and affect. Her behavior is normal. Judgment and thought content normal.   BP 130/81   Pulse (!) 104   Temp 97.5 F (36.4 C) (Oral)   Ht 5\' 4"  (1.626 m)   Wt 203 lb (92.1 kg)   BMI 34.84 kg/m       Assessment & Plan:  1. Cutaneous candidiasis Keep area as clean and dry as possible Avoid scratching - nystatin cream (MYCOSTATIN); Apply 1 application topically 2 (two) times daily.  Dispense: 30 g; Refill: 0  2. Diarrhea due to drug Decrease metformin to 500mg  bid Watch carbs in diet Check blood sugars daily- if start increasing let me know  [name] Hassell Done, FNP

## 2017-02-28 ENCOUNTER — Other Ambulatory Visit: Payer: Self-pay | Admitting: Nurse Practitioner

## 2017-03-09 ENCOUNTER — Other Ambulatory Visit: Payer: Self-pay | Admitting: Nurse Practitioner

## 2017-03-09 DIAGNOSIS — E1142 Type 2 diabetes mellitus with diabetic polyneuropathy: Secondary | ICD-10-CM

## 2017-03-09 DIAGNOSIS — E034 Atrophy of thyroid (acquired): Secondary | ICD-10-CM

## 2017-03-09 DIAGNOSIS — R609 Edema, unspecified: Secondary | ICD-10-CM

## 2017-03-29 ENCOUNTER — Ambulatory Visit (INDEPENDENT_AMBULATORY_CARE_PROVIDER_SITE_OTHER): Payer: Medicare Other | Admitting: Nurse Practitioner

## 2017-03-29 ENCOUNTER — Encounter: Payer: Self-pay | Admitting: Nurse Practitioner

## 2017-03-29 VITALS — BP 132/80 | HR 87 | Temp 97.1°F | Ht 64.0 in | Wt 224.0 lb

## 2017-03-29 DIAGNOSIS — I4819 Other persistent atrial fibrillation: Secondary | ICD-10-CM

## 2017-03-29 DIAGNOSIS — E1142 Type 2 diabetes mellitus with diabetic polyneuropathy: Secondary | ICD-10-CM

## 2017-03-29 DIAGNOSIS — I481 Persistent atrial fibrillation: Secondary | ICD-10-CM | POA: Diagnosis not present

## 2017-03-29 DIAGNOSIS — I10 Essential (primary) hypertension: Secondary | ICD-10-CM

## 2017-03-29 DIAGNOSIS — R609 Edema, unspecified: Secondary | ICD-10-CM

## 2017-03-29 DIAGNOSIS — E785 Hyperlipidemia, unspecified: Secondary | ICD-10-CM | POA: Diagnosis not present

## 2017-03-29 DIAGNOSIS — E034 Atrophy of thyroid (acquired): Secondary | ICD-10-CM | POA: Diagnosis not present

## 2017-03-29 LAB — BAYER DCA HB A1C WAIVED: HB A1C: 6.5 % (ref <7.0)

## 2017-03-29 MED ORDER — LEVOTHYROXINE SODIUM 175 MCG PO TABS: 175.0000 ug | ORAL_TABLET | Freq: Every day | ORAL | 1 refills | Status: DC

## 2017-03-29 MED ORDER — V-2 HIGH COMPRESSION HOSE MISC: 0 refills | Status: DC

## 2017-03-29 MED ORDER — FUROSEMIDE 40 MG PO TABS: 40.0000 mg | ORAL_TABLET | Freq: Every day | ORAL | 3 refills | Status: DC

## 2017-03-29 MED ORDER — METFORMIN HCL 1000 MG PO TABS: ORAL_TABLET | ORAL | 1 refills | Status: DC

## 2017-03-29 NOTE — Patient Instructions (Signed)
Edema Edema is when you have too much fluid in your body or under your skin. Edema may make your legs, feet, and ankles swell up. Swelling is also common in looser tissues, like around your eyes. This is a common condition. It gets more common as you get older. There are many possible causes of edema. Eating too much salt (sodium) and being on your feet or sitting for a long time can cause edema in your legs, feet, and ankles. Hot weather may make edema worse. Edema is usually painless. Your skin may look swollen or shiny. Follow these instructions at home:  Keep the swollen body part raised (elevated) above the level of your heart when you are sitting or lying down.  Do not sit still or stand for a long time.  Do not wear tight clothes. Do not wear garters on your upper legs.  Exercise your legs. This can help the swelling go down.  Wear elastic bandages or support stockings as told by your doctor.  Eat a low-salt (low-sodium) diet to reduce fluid as told by your doctor.  Depending on the cause of your swelling, you may need to limit how much fluid you drink (fluid restriction).  Take over-the-counter and prescription medicines only as told by your doctor. Contact a doctor if:  Treatment is not working.  You have heart, liver, or kidney disease and have symptoms of edema.  You have sudden and unexplained weight gain. Get help right away if:  You have shortness of breath or chest pain.  You cannot breathe when you lie down.  You have pain, redness, or warmth in the swollen areas.  You have heart, liver, or kidney disease and get edema all of a sudden.  You have a fever and your symptoms get worse all of a sudden. Summary  Edema is when you have too much fluid in your body or under your skin.  Edema may make your legs, feet, and ankles swell up. Swelling is also common in looser tissues, like around your eyes.  Raise (elevate) the swollen body part above the level of your  heart when you are sitting or lying down.  Follow your doctor's instructions about diet and how much fluid you can drink (fluid restriction). This information is not intended to replace advice given to you by your health care provider. Make sure you discuss any questions you have with your health care provider. Document Released: 05/03/2008 Document Revised: 12/03/2016 Document Reviewed: 12/03/2016 Elsevier Interactive Patient Education  2017 [employer]  

## 2017-03-29 NOTE — Progress Notes (Signed)
Subjective:    Patient ID: Debra Campbell, female    DOB: 11-Dec-1931, 83 y.o.   MRN: [id number]  HPI   Debra Campbell is here today for follow up of chronic medical problem.  Outpatient Encounter Prescriptions as of 03/29/2017  Medication Sig  . furosemide (LASIX) 20 MG tablet TAKE 1 TABLET EVERY DAY  . gabapentin (NEURONTIN) 100 MG capsule Take 300 mg by mouth at bedtime.  [name] glucose blood (ACCU-CHEK AVIVA PLUS) test strip Test 1x per day and prn. DX.E11.9  . levothyroxine (SYNTHROID, LEVOTHROID) 175 MCG tablet TAKE 1 TABLET DAILY BEFORE BREAKFAST.  [name] losartan (COZAAR) 50 MG tablet TAKE 1 TABLET AT BEDTIME  . metFORMIN (GLUCOPHAGE) 1000 MG tablet TAKE 1 TABLET TWICE DAILY WITH A MEAL  . methocarbamol (ROBAXIN) 500 MG tablet Take 1 tablet (500 mg total) by mouth every 6 (six) hours as needed for muscle spasms.  . metoprolol succinate (TOPROL XL) 25 MG 24 hr tablet Take 1 tablet (25 mg total) by mouth daily.  . Multiple Vitamin (MULTIVITAMIN WITH MINERALS) TABS Take 1 tablet by mouth daily.  [name] nystatin cream (MYCOSTATIN) Apply 1 application topically 2 (two) times daily.  [name] oxyCODONE-acetaminophen (PERCOCET/ROXICET) 5-325 MG tablet Take 1-2 tablets by mouth every 4 (four) hours as needed for moderate pain.  . rivaroxaban (XARELTO) 20 MG TABS tablet Take 1 tablet (20 mg total) by mouth daily with supper.  . triamcinolone cream (KENALOG) 0.5 % Apply 1 application topically 3 (three) times daily. (Patient taking differently: Apply 1 application topically daily as needed (itching). )  . TRUEPLUS LANCETS 30G MISC CHECK BLOOD SUGAR ONCE A DAY AND AS NEEDED   No facility-administered encounter medications on file as of 03/29/2017.     1. Essential hypertension   no c/o chest pain,SOB or HA- does not check blood pressure at home  2. Persistent atrial fibrillation (HCC)   only on daily aspirin- does not fell irregularity  3. Diabetic polyneuropathy associated with type 2 diabetes mellitus (HCC)    Always has burning sensation in bil feet- neurotin helps with this- denies any lesions/sores on feet  4. Hypothyroidism due to acquired atrophy of thyroid  No problems that she is aware of  5. Type 2 diabetes mellitus with diabetic polyneuropathy, without long-term current use of insulin (HCC)   last HGBA1C was 6.5%- fasting bloos sugars are between 110-130. Denies hypoglycemia episodes  6. Hyperlipidemia, unspecified hyperlipidemia type   tries to watch diet  7. Morbid obesity ([place])   no recent weight gain or weight loss    New complaints: None today    Review of Systems  Constitutional: Negative for diaphoresis.  Eyes: Negative for pain.  Respiratory: Negative for shortness of breath.   Cardiovascular: Negative for chest pain, palpitations and leg swelling.  Gastrointestinal: Negative for abdominal pain.  Endocrine: Negative for polydipsia.  Skin: Negative for rash.  Neurological: Negative for dizziness, weakness and headaches.  Hematological: Does not bruise/bleed easily.       Objective:   Physical Exam  Constitutional: She is oriented to person, place, and time. She appears well-developed and well-nourished.  HENT:  Nose: Nose normal.  Mouth/Throat: Oropharynx is clear and moist.  Eyes: EOM are normal.  Neck: Trachea normal, normal range of motion and full passive range of motion without pain. Neck supple. No JVD present. Carotid bruit is not present. No thyromegaly present.  Cardiovascular: Normal rate, regular rhythm, normal heart sounds and intact distal pulses.  Exam reveals no gallop  and no friction rub.   No murmur heard. Pulmonary/Chest: Effort normal and breath sounds normal.  Abdominal: Soft. Bowel sounds are normal. She exhibits no distension and no mass. There is no tenderness.  Musculoskeletal: Normal range of motion. She exhibits edema (2+ edema bil lower ext).  Lymphadenopathy:    She has no cervical adenopathy.  Neurological: She is alert and oriented  to person, place, and time. She has normal reflexes.  Skin: Skin is warm and dry.  Psychiatric: She has a normal mood and affect. Her behavior is normal. Judgment and thought content normal.    BP 132/80   Pulse 87   Temp 97.1 F (36.2 C) (Oral)   Ht 5' 4"  (1.626 m)   Wt 224 lb (101.6 kg)   BMI 38.45 kg/m   HGBA1c 6.5%     Assessment & Plan:  1. Essential hypertension Low sodium diet - CMP14+EGFR  2. Persistent atrial fibrillation (HCC) Continue aspirin  3. Diabetic polyneuropathy associated with type 2 diabetes mellitus ([place]) Do not go barefooted  4. Hypothyroidism due to acquired atrophy of thyroid - levothyroxine (SYNTHROID, LEVOTHROID) 175 MCG tablet; Take 1 tablet (175 mcg total) by mouth daily before breakfast.  Dispense: 90 tablet; Refill: 1  5. Type 2 diabetes mellitus with diabetic polyneuropathy, without long-term current use of insulin (HCC) Continue to watch carbs in diet - Bayer DCA Hb A1c Waived - metFORMIN (GLUCOPHAGE) 1000 MG tablet; TAKE 1 TABLET TWICE DAILY WITH A MEAL  Dispense: 180 tablet; Refill: 1  6. Hyperlipidemia, unspecified hyperlipidemia type Low fat diet - Lipid panel  7. Morbid obesity ([place]) Discussed diet and exercise for person with BMI >25 Will recheck weight in 3-6 months  8. Peripheral edema elvate legs when sitting Wear compression hose daily - furosemide (LASIX) 40 MG tablet; Take 1 tablet (40 mg total) by mouth daily.  Dispense: 30 tablet; Refill: 3    Labs pending Health maintenance reviewed Diet and exercise encouraged Continue all meds Follow up  In 3 months   [place], FNP

## 2017-03-30 LAB — LIPID PANEL
CHOL/HDL RATIO: 2.9 ratio (ref 0.0–4.4)
Cholesterol, Total: 193 mg/dL (ref 100–199)
HDL: 66 mg/dL (ref >39)
LDL Calculated: 103 mg/dL — ABNORMAL HIGH (ref 0–99)
TRIGLYCERIDES: 122 mg/dL (ref 0–149)
VLDL Cholesterol Cal: 24 mg/dL (ref 5–40)

## 2017-03-30 LAB — CMP14+EGFR
A/G RATIO: 1.4 (ref 1.2–2.2)
ALT: 16 IU/L (ref 0–32)
AST: 23 IU/L (ref 0–40)
Albumin: 3.9 g/dL (ref 3.5–4.7)
Alkaline Phosphatase: 55 IU/L (ref 39–117)
BUN / CREAT RATIO: 19 (ref 12–28)
BUN: 18 mg/dL (ref 8–27)
Bilirubin Total: 0.6 mg/dL (ref 0.0–1.2)
CALCIUM: 9.2 mg/dL (ref 8.7–10.3)
CO2: 26 mmol/L (ref 18–29)
Chloride: 102 mmol/L (ref 96–106)
Creatinine, Ser: 0.97 mg/dL (ref 0.57–1.00)
GFR, EST AFRICAN AMERICAN: 63 mL/min/{1.73_m2} (ref >59)
GFR, EST NON AFRICAN AMERICAN: 55 mL/min/{1.73_m2} — AB (ref >59)
GLOBULIN, TOTAL: 2.7 g/dL (ref 1.5–4.5)
Glucose: 136 mg/dL — ABNORMAL HIGH (ref 65–99)
POTASSIUM: 4.5 mmol/L (ref 3.5–5.2)
SODIUM: 142 mmol/L (ref 134–144)
Total Protein: 6.6 g/dL (ref 6.0–8.5)

## 2017-04-01 ENCOUNTER — Ambulatory Visit: Payer: Medicare Other | Admitting: Gynecology

## 2017-04-15 ENCOUNTER — Ambulatory Visit: Payer: Medicare Other | Attending: Gynecology | Admitting: Gynecology

## 2017-04-15 ENCOUNTER — Encounter: Payer: Self-pay | Admitting: Gynecology

## 2017-04-15 VITALS — BP 137/75 | HR 80 | Temp 97.7°F | Resp 20 | Wt 221.0 lb

## 2017-04-15 DIAGNOSIS — E785 Hyperlipidemia, unspecified: Secondary | ICD-10-CM | POA: Insufficient documentation

## 2017-04-15 DIAGNOSIS — Z888 Allergy status to other drugs, medicaments and biological substances status: Secondary | ICD-10-CM | POA: Insufficient documentation

## 2017-04-15 DIAGNOSIS — Z7984 Long term (current) use of oral hypoglycemic drugs: Secondary | ICD-10-CM | POA: Insufficient documentation

## 2017-04-15 DIAGNOSIS — Z8261 Family history of arthritis: Secondary | ICD-10-CM | POA: Insufficient documentation

## 2017-04-15 DIAGNOSIS — M1990 Unspecified osteoarthritis, unspecified site: Secondary | ICD-10-CM | POA: Insufficient documentation

## 2017-04-15 DIAGNOSIS — Z803 Family history of malignant neoplasm of breast: Secondary | ICD-10-CM | POA: Insufficient documentation

## 2017-04-15 DIAGNOSIS — Z08 Encounter for follow-up examination after completed treatment for malignant neoplasm: Secondary | ICD-10-CM | POA: Insufficient documentation

## 2017-04-15 DIAGNOSIS — I10 Essential (primary) hypertension: Secondary | ICD-10-CM | POA: Insufficient documentation

## 2017-04-15 DIAGNOSIS — Z96649 Presence of unspecified artificial hip joint: Secondary | ICD-10-CM | POA: Insufficient documentation

## 2017-04-15 DIAGNOSIS — I481 Persistent atrial fibrillation: Secondary | ICD-10-CM | POA: Diagnosis not present

## 2017-04-15 DIAGNOSIS — E079 Disorder of thyroid, unspecified: Secondary | ICD-10-CM | POA: Insufficient documentation

## 2017-04-15 DIAGNOSIS — E119 Type 2 diabetes mellitus without complications: Secondary | ICD-10-CM | POA: Diagnosis not present

## 2017-04-15 DIAGNOSIS — Z8542 Personal history of malignant neoplasm of other parts of uterus: Secondary | ICD-10-CM | POA: Insufficient documentation

## 2017-04-15 DIAGNOSIS — Z882 Allergy status to sulfonamides status: Secondary | ICD-10-CM | POA: Insufficient documentation

## 2017-04-15 DIAGNOSIS — Z87442 Personal history of urinary calculi: Secondary | ICD-10-CM | POA: Insufficient documentation

## 2017-04-15 DIAGNOSIS — E039 Hypothyroidism, unspecified: Secondary | ICD-10-CM | POA: Diagnosis not present

## 2017-04-15 DIAGNOSIS — R32 Unspecified urinary incontinence: Secondary | ICD-10-CM | POA: Diagnosis not present

## 2017-04-15 DIAGNOSIS — Z8249 Family history of ischemic heart disease and other diseases of the circulatory system: Secondary | ICD-10-CM | POA: Diagnosis not present

## 2017-04-15 DIAGNOSIS — Z7982 Long term (current) use of aspirin: Secondary | ICD-10-CM | POA: Insufficient documentation

## 2017-04-15 DIAGNOSIS — Z90710 Acquired absence of both cervix and uterus: Secondary | ICD-10-CM | POA: Insufficient documentation

## 2017-04-15 DIAGNOSIS — Z8349 Family history of other endocrine, nutritional and metabolic diseases: Secondary | ICD-10-CM | POA: Diagnosis not present

## 2017-04-15 DIAGNOSIS — C541 Malignant neoplasm of endometrium: Secondary | ICD-10-CM

## 2017-04-15 DIAGNOSIS — Z79899 Other long term (current) drug therapy: Secondary | ICD-10-CM | POA: Insufficient documentation

## 2017-04-15 DIAGNOSIS — Z880 Allergy status to penicillin: Secondary | ICD-10-CM | POA: Insufficient documentation

## 2017-04-15 DIAGNOSIS — Z90722 Acquired absence of ovaries, bilateral: Secondary | ICD-10-CM | POA: Insufficient documentation

## 2017-04-15 NOTE — Patient Instructions (Signed)
Return to see me in one year. 

## 2017-04-15 NOTE — Progress Notes (Signed)
Consult Note: Gyn-Onc   AJWA KIMBERLEY 83 y.o. female  Chief Complaint  Patient presents with  . Endometrial Cancer    Assessment :  Stage IB grade 1 endometrial adenocarcinoma (July 2014). Clinically free of disease  Plan: The patient returns see me in one year. We will discontinue doing Pap smears. Should patient wish any management of her incontinence she'll contact us and we will refer her to Dr. Maryland Pink..   Interval history: Patient returns today as previously scheduled. Since her last visit she's done well. Her primary complaint is urinary incontinence. On questioning this sounds more like a urge incontinence problem. The patient does wear a pad. She denies any other GI GU or pelvic symptoms.  [name]   HPI: Patient was initially seen in consultation request of Dr. Roselie Awkward regarding management of a newly diagnosed endometrial carcinoma. The patient had some abnormal bleeding approximately 3 months prior to diagnosis.. She underwent a D&C on 04/27/2013 revealing a grade 1 endometrial carcinoma in a background of atypical complex hyperplasia.  Ultrasound exam shows a uterus measures 7.9 x 4.5 x 7.3 cm.  She has a past history of a laparotomy for an ectopic pregnancy. She's also had a total hip replacement.  Patient underwent a total abdominal hysterectomy bilateral salpingo-oophorectomy and pelvic lymphadenectomy on 06/26/2013. Final pathology showed a stage IB grade 1 endometrial carcinoma. No adjuvant therapy was recommended.  Review of Systems:10 point review of systems is negative except as noted in interval history.   Vitals: Blood pressure 137/75, pulse 80, temperature 97.7 F (36.5 C), resp. rate 20, weight 221 lb (100.2 kg).  Physical Exam: General : The patient is a healthy woman in no acute distress.  HEENT: normocephalic, extraoccular movements normal; neck is supple without thyromegally  Lynphnodes: Supraclavicular and inguinal nodes not enlarged  Abdomen: Soft,  non-tender, no ascites, no organomegally, no masses, small umbilical hernia. Midline incision is healing well Pelvic:  EGBUS: Normal female  Vagina: Normal, no lesions  Urethra and Bladder: Normal, non-tender  Cervix: Surgically absent  Uterus: Surgically absent Bi-manual examination: Non-tender; no adenxal masses or nodularity  Rectal: normal sphincter tone, no masses, no blood  Lower extremities: No edema or varicosities. Normal range of motion      Allergies  Allergen Reactions  . Ace Inhibitors Cough  . Iohexol      Desc: HIVES 40 YEARS AGO   . Statins     myalgia  . Iodine Rash  . Penicillins Swelling and Rash    Has patient had a PCN reaction causing immediate rash, facial/tongue/throat swelling, SOB or lightheadedness with hypotension: Yes Has patient had a PCN reaction causing severe rash involving mucus membranes or skin necrosis: No Has patient had a PCN reaction that required hospitalization Yes Has patient had a PCN reaction occurring within the last 10 years: No If all of the above answers are "NO", then may proceed with Cephalosporin use.   . Sulfa Antibiotics Rash    Past Medical History:  Diagnosis Date  . Arthritis    legs, lower back, hands  . Cancer ([place])    endometrial  . Diabetes mellitus   . Ectopic pregnancy   . Eczema   . Headache(784.0)    hx of  . History of kidney stones   . Hyperlipidemia    no meds  . Hypertension   . Hypothyroidism   . Persistent atrial fibrillation ([place]) 09/28/2016   Relatively new Dx: Asymptomatic.  Rate controlled without medication.  . SVD (  spontaneous vaginal delivery)    x 3  . Thyroid disease     Past Surgical History:  Procedure Laterality Date  . ABDOMINAL HYSTERECTOMY Bilateral 06/26/2013   Procedure: TOTAL ABDOMINAL HYSTERECTOMY WITH BILATERAL SALPINGO OOPHERECTOMY WITH PELVIC LYMPHADNECTOMY;  Surgeon: Alvino Chapel, MD;  Location: [facility];  Service: Gynecology;  Laterality: Bilateral;  .  BACK SURGERY  06/26/2013  . ECTOPIC PREGNANCY SURGERY    . HERNIA REPAIR    . HYSTEROSCOPY W/D&C N/A 04/26/2013   Procedure: DILATATION AND CURETTAGE /HYSTEROSCOPY;  Surgeon: Woodroe Mode, MD;  Location: [facility];  Service: Gynecology;  Laterality: N/A;  . JOINT REPLACEMENT    . LAPAROTOMY     for ectopic pregnancy  . LAPAROTOMY N/A 06/26/2013   Procedure: EXPLORATORY LAPAROTOMY;  Surgeon: Alvino Chapel, MD;  Location: [facility];  Service: Gynecology;  Laterality: N/A;  . lt knee arthroscopy    . rt total hip replacement    . TONSILLECTOMY    . TOTAL KNEE ARTHROPLASTY Left 12/22/2016   Procedure: LEFT TOTAL KNEE ARTHROPLASTY;  Surgeon: Latanya Maudlin, MD;  Location: [facility];  Service: Orthopedics;  Laterality: Left;  . WISDOM TOOTH EXTRACTION      Current Outpatient Prescriptions  Medication Sig Dispense Refill  . acetaminophen (TYLENOL) 500 MG tablet Take 500 mg by mouth every 6 (six) hours as needed.    [name] aspirin 325 MG EC tablet Take 325 mg by mouth daily.    . Elastic Bandages & Supports (V-2 HIGH COMPRESSION HOSE) MISC Wear daily when up on feet 1 each 0  . furosemide (LASIX) 40 MG tablet Take 1 tablet (40 mg total) by mouth daily. 30 tablet 3  . gabapentin (NEURONTIN) 100 MG capsule Take 300 mg by mouth at bedtime.    [name] glucose blood (ACCU-CHEK AVIVA PLUS) test strip Test 1x per day and prn. DX.E11.9 100 each 12  . levothyroxine (SYNTHROID, LEVOTHROID) 175 MCG tablet Take 1 tablet (175 mcg total) by mouth daily before breakfast. 90 tablet 1  . losartan (COZAAR) 50 MG tablet TAKE 1 TABLET AT BEDTIME 90 tablet 1  . metFORMIN (GLUCOPHAGE) 1000 MG tablet TAKE 1 TABLET TWICE DAILY WITH A MEAL 180 tablet 1  . methocarbamol (ROBAXIN) 500 MG tablet Take 1 tablet (500 mg total) by mouth every 6 (six) hours as needed for muscle spasms. 40 tablet 1  . metoprolol succinate (TOPROL XL) 25 MG 24 hr tablet Take 1 tablet (25 mg total) by mouth daily. 90 tablet 3  . Multiple Vitamin  (MULTIVITAMIN WITH MINERALS) TABS Take 1 tablet by mouth daily.    [name] nystatin cream (MYCOSTATIN) Apply 1 application topically 2 (two) times daily. 30 g 0  . triamcinolone cream (KENALOG) 0.5 % Apply 1 application topically 3 (three) times daily. (Patient taking differently: Apply 1 application topically daily as needed (itching). ) 30 g 0  . TRUEPLUS LANCETS 30G MISC CHECK BLOOD SUGAR ONCE A DAY AND AS NEEDED 100 each 2   No current facility-administered medications for this visit.     Social History   Social History  . Marital status: Married    Spouse name: N/A  . Number of children: N/A  . Years of education: N/A   Occupational History  . Not on file.   Social History Main Topics  . Smoking status: Never Smoker  . Smokeless tobacco: Never Used  . Alcohol use No  . Drug use: No  . Sexual activity: No   Other Topics Concern  .  Not on file   Social History Narrative  . No narrative on file    Family History  Problem Relation Age of Onset  . Heart disease Father   . Thyroid disease Father   . Heart attack Father   . Heart disease Sister        open heart surgery  . Cancer Sister        breast  . Neuropathy Sister        secondary to cancer treatment  . Breast cancer Sister   . Suicidality Brother 60  . Arthritis Mother   . Cancer Daughter   . Breast cancer Daughter       Marti Sleigh, MD 04/15/2017, 12:44 PM

## 2017-04-21 ENCOUNTER — Other Ambulatory Visit: Payer: Self-pay | Admitting: Orthopedic Surgery

## 2017-04-21 ENCOUNTER — Other Ambulatory Visit (INDEPENDENT_AMBULATORY_CARE_PROVIDER_SITE_OTHER): Payer: Medicare Other

## 2017-04-21 ENCOUNTER — Ambulatory Visit (INDEPENDENT_AMBULATORY_CARE_PROVIDER_SITE_OTHER): Payer: Medicare Other

## 2017-04-21 DIAGNOSIS — M25562 Pain in left knee: Secondary | ICD-10-CM

## 2017-04-21 DIAGNOSIS — Z9889 Other specified postprocedural states: Secondary | ICD-10-CM

## 2017-04-21 DIAGNOSIS — Z96652 Presence of left artificial knee joint: Secondary | ICD-10-CM | POA: Diagnosis not present

## 2017-04-27 ENCOUNTER — Encounter: Payer: Self-pay | Admitting: *Deleted

## 2017-05-11 ENCOUNTER — Other Ambulatory Visit: Payer: Self-pay | Admitting: Nurse Practitioner

## 2017-05-11 DIAGNOSIS — E034 Atrophy of thyroid (acquired): Secondary | ICD-10-CM

## 2017-05-11 DIAGNOSIS — E1142 Type 2 diabetes mellitus with diabetic polyneuropathy: Secondary | ICD-10-CM

## 2017-05-12 NOTE — Telephone Encounter (Signed)
Last TSH was in 2014

## 2017-05-30 ENCOUNTER — Telehealth: Payer: Self-pay | Admitting: Nurse Practitioner

## 2017-06-29 ENCOUNTER — Other Ambulatory Visit: Payer: Self-pay | Admitting: Nurse Practitioner

## 2017-06-29 DIAGNOSIS — I10 Essential (primary) hypertension: Secondary | ICD-10-CM

## 2017-06-30 ENCOUNTER — Ambulatory Visit: Payer: Medicare Other | Admitting: Nurse Practitioner

## 2017-07-04 ENCOUNTER — Encounter: Payer: Self-pay | Admitting: Nurse Practitioner

## 2017-07-04 ENCOUNTER — Ambulatory Visit (INDEPENDENT_AMBULATORY_CARE_PROVIDER_SITE_OTHER): Payer: Medicare Other | Admitting: Nurse Practitioner

## 2017-07-04 VITALS — BP 137/81 | HR 81 | Temp 97.4°F | Ht 64.0 in | Wt 226.0 lb

## 2017-07-04 DIAGNOSIS — M8588 Other specified disorders of bone density and structure, other site: Secondary | ICD-10-CM | POA: Diagnosis not present

## 2017-07-04 DIAGNOSIS — E1142 Type 2 diabetes mellitus with diabetic polyneuropathy: Secondary | ICD-10-CM | POA: Diagnosis not present

## 2017-07-04 DIAGNOSIS — E785 Hyperlipidemia, unspecified: Secondary | ICD-10-CM | POA: Diagnosis not present

## 2017-07-04 DIAGNOSIS — R609 Edema, unspecified: Secondary | ICD-10-CM | POA: Diagnosis not present

## 2017-07-04 DIAGNOSIS — I4819 Other persistent atrial fibrillation: Secondary | ICD-10-CM

## 2017-07-04 DIAGNOSIS — I10 Essential (primary) hypertension: Secondary | ICD-10-CM

## 2017-07-04 DIAGNOSIS — I481 Persistent atrial fibrillation: Secondary | ICD-10-CM | POA: Diagnosis not present

## 2017-07-04 DIAGNOSIS — Z1231 Encounter for screening mammogram for malignant neoplasm of breast: Secondary | ICD-10-CM

## 2017-07-04 DIAGNOSIS — E034 Atrophy of thyroid (acquired): Secondary | ICD-10-CM

## 2017-07-04 LAB — BAYER DCA HB A1C WAIVED: HB A1C (BAYER DCA - WAIVED): 6.3 % (ref <7.0)

## 2017-07-04 MED ORDER — FUROSEMIDE 40 MG PO TABS: 40.0000 mg | ORAL_TABLET | Freq: Every day | ORAL | 3 refills | Status: DC

## 2017-07-04 NOTE — Progress Notes (Signed)
Subjective:    Patient ID: Debra Campbell, female    DOB: 1932/03/05, 83 y.o.   MRN: [id number]  HPI  Debra Campbell is here today for follow up of chronic medical problem.  Outpatient Encounter Prescriptions as of 07/04/2017  Medication Sig  . acetaminophen (TYLENOL) 500 MG tablet Take 500 mg by mouth every 6 (six) hours as needed.  [name] aspirin 325 MG EC tablet Take 325 mg by mouth daily.  . Elastic Bandages & Supports (V-2 HIGH COMPRESSION HOSE) MISC Wear daily when up on feet  . furosemide (LASIX) 40 MG tablet Take 1 tablet (40 mg total) by mouth daily.  [name] gabapentin (NEURONTIN) 100 MG capsule Take 300 mg by mouth at bedtime.  [name] glucose blood (ACCU-CHEK AVIVA PLUS) test strip Test 1x per day and prn. DX.E11.9  . levothyroxine (SYNTHROID, LEVOTHROID) 175 MCG tablet Take 1 tablet (175 mcg total) by mouth daily before breakfast.  . losartan (COZAAR) 50 MG tablet TAKE 1 TABLET (50 MG TOTAL) BY MOUTH AT BEDTIME.  . metFORMIN (GLUCOPHAGE) 1000 MG tablet TAKE 1 TABLET TWICE DAILY WITH A MEAL  . methocarbamol (ROBAXIN) 500 MG tablet Take 1 tablet (500 mg total) by mouth every 6 (six) hours as needed for muscle spasms.  . metoprolol succinate (TOPROL XL) 25 MG 24 hr tablet Take 1 tablet (25 mg total) by mouth daily.  . Multiple Vitamin (MULTIVITAMIN WITH MINERALS) TABS Take 1 tablet by mouth daily.  [name] nystatin cream (MYCOSTATIN) Apply 1 application topically 2 (two) times daily.  [name] triamcinolone cream (KENALOG) 0.5 % Apply 1 application topically 3 (three) times daily. (Patient taking differently: Apply 1 application topically daily as needed (itching). )  . TRUEPLUS LANCETS 30G MISC CHECK BLOOD SUGAR ONCE A DAY AND AS NEEDED   No facility-administered encounter medications on file as of 07/04/2017.     1. Essential hypertension  No c/o chest pain ,SOb and headache. Does not check blood pressure at home.  2. Type 2 diabetes mellitus with diabetic polyneuropathy, without long-term current use of  insulin (HCC) Hgba1c 6.5% last visit. Checks blood sugars 2-3 x a week. Running around 130's.  3. Hyperlipidemia, unspecified hyperlipidemia type  Does not watch diet very closely  4. Persistent atrial fibrillation (HCC)  Does not feel any palpitations  5. Hypothyroidism due to acquired atrophy of thyroid  No problmes that she is awareof  6. Diabetic polyneuropathy associated with type 2 diabetes mellitus ([place])  Feet stay numb  7. Osteopenia of spine  Walks daily  8. Morbid obesity (HCC)  No change in weight lately    New complaints: Today C/O swelling of bil lower ext- has been going on for several months- has compression hose but does not wear then everyday  Social history: Debra Campbell still living- gets around well    Review of Systems  Constitutional: Negative for activity change and appetite change.  HENT: Negative.   Eyes: Negative for pain.  Respiratory: Negative for shortness of breath.   Cardiovascular: Positive for leg swelling. Negative for chest pain and palpitations.  Gastrointestinal: Negative for abdominal pain.  Endocrine: Negative for polydipsia.  Genitourinary: Negative.   Skin: Negative for rash.  Neurological: Negative for dizziness, weakness and headaches.  Hematological: Does not bruise/bleed easily.  Psychiatric/Behavioral: Negative.   All other systems reviewed and are negative.      Objective:   Physical Exam  Constitutional: She is oriented to person, place, and time. She appears well-developed and well-nourished.  HENT:  Nose: Nose normal.  Mouth/Throat: Oropharynx is clear and moist.  Eyes: EOM are normal.  Neck: Trachea normal, normal range of motion and full passive range of motion without pain. Neck supple. No JVD present. Carotid bruit is not present. No thyromegaly present.  Cardiovascular: Normal rate, normal heart sounds and intact distal pulses.  Exam reveals no gallop and no friction rub.   No murmur heard. Irregular     Pulmonary/Chest: Effort normal and breath sounds normal.  Abdominal: Soft. Bowel sounds are normal. She exhibits no distension and no mass. There is no tenderness.  Musculoskeletal: Normal range of motion. Edema: 2+ edema bil lower ext with weapin form right lower ext.  Lymphadenopathy:    She has no cervical adenopathy.  Neurological: She is alert and oriented to person, place, and time. She has normal reflexes.  Skin: Skin is warm and dry.  Psychiatric: She has a normal mood and affect. Her behavior is normal. Judgment and thought content normal.   BP 137/81   Pulse 81   Temp (!) 97.4 F (36.3 C) (Oral)   Ht _0  (1.626 m)   Wt 226 lb (102.5 kg)   BMI 38.79 kg/m         Assessment & Plan:  1. Essential hypertension Low sodium diet - CMP14+EGFR  2. Type 2 diabetes mellitus with diabetic polyneuropathy, without long-term current use of insulin (HCC) Continue to watch carbsi n diet - Bayer DCA Hb A1c Waived  3. Hyperlipidemia, unspecified hyperlipidemia type Low fat diet - Lipid panel  4. Persistent atrial fibrillation ([place]) keep follow up with cardiology  5. Hypothyroidism due to acquired atrophy of thyroid  6. Diabetic polyneuropathy associated with type 2 diabetes mellitus ([place]) Do not go barefooted  7. Osteopenia of spine Weight bearing exercise as can  8. Morbid obesity ([place]) Discussed diet and exercise for person with BMI >25 Will recheck weight in 3-6 months  9. Peripheral edema Unna boots Follow up Thursday elevtae legs when sitting - furosemide (LASIX) 40 MG tablet; Take 1 tablet (40 mg total) by mouth daily.  Dispense: 30 tablet; Refill: 3 - Remv/revisn boot/body cast   Needs eye exam Labs pending Health maintenance reviewed Diet and exercise encouraged Continue all meds Follow up  In 3 months   [place], FNP

## 2017-07-04 NOTE — Patient Instructions (Signed)
Edema Edema is when you have too much fluid in your body or under your skin. Edema may make your legs, feet, and ankles swell up. Swelling is also common in looser tissues, like around your eyes. This is a common condition. It gets more common as you get older. There are many possible causes of edema. Eating too much salt (sodium) and being on your feet or sitting for a long time can cause edema in your legs, feet, and ankles. Hot weather may make edema worse. Edema is usually painless. Your skin may look swollen or shiny. Follow these instructions at home:  Keep the swollen body part raised (elevated) above the level of your heart when you are sitting or lying down.  Do not sit still or stand for a long time.  Do not wear tight clothes. Do not wear garters on your upper legs.  Exercise your legs. This can help the swelling go down.  Wear elastic bandages or support stockings as told by your doctor.  Eat a low-salt (low-sodium) diet to reduce fluid as told by your doctor.  Depending on the cause of your swelling, you may need to limit how much fluid you drink (fluid restriction).  Take over-the-counter and prescription medicines only as told by your doctor. Contact a doctor if:  Treatment is not working.  You have heart, liver, or kidney disease and have symptoms of edema.  You have sudden and unexplained weight gain. Get help right away if:  You have shortness of breath or chest pain.  You cannot breathe when you lie down.  You have pain, redness, or warmth in the swollen areas.  You have heart, liver, or kidney disease and get edema all of a sudden.  You have a fever and your symptoms get worse all of a sudden. Summary  Edema is when you have too much fluid in your body or under your skin.  Edema may make your legs, feet, and ankles swell up. Swelling is also common in looser tissues, like around your eyes.  Raise (elevate) the swollen body part above the level of your  heart when you are sitting or lying down.  Follow your doctor's instructions about diet and how much fluid you can drink (fluid restriction). This information is not intended to replace advice given to you by your health care provider. Make sure you discuss any questions you have with your health care provider. Document Released: 05/03/2008 Document Revised: 12/03/2016 Document Reviewed: 12/03/2016 Elsevier Interactive Patient Education  2017 [employer]  

## 2017-07-05 ENCOUNTER — Other Ambulatory Visit: Payer: Medicare Other

## 2017-07-05 ENCOUNTER — Other Ambulatory Visit: Payer: Self-pay | Admitting: *Deleted

## 2017-07-05 DIAGNOSIS — E875 Hyperkalemia: Secondary | ICD-10-CM | POA: Diagnosis not present

## 2017-07-05 LAB — CMP14+EGFR
ALT: 13 IU/L (ref 0–32)
AST: 20 IU/L (ref 0–40)
Albumin/Globulin Ratio: 1.6 (ref 1.2–2.2)
Albumin: 4.2 g/dL (ref 3.5–4.7)
Alkaline Phosphatase: 49 IU/L (ref 39–117)
BUN/Creatinine Ratio: 13 (ref 12–28)
BUN: 13 mg/dL (ref 8–27)
Bilirubin Total: 0.4 mg/dL (ref 0.0–1.2)
CALCIUM: 9.9 mg/dL (ref 8.7–10.3)
CO2: 27 mmol/L (ref 20–29)
CREATININE: 0.98 mg/dL (ref 0.57–1.00)
Chloride: 103 mmol/L (ref 96–106)
GFR calc Af Amer: 62 mL/min/{1.73_m2} (ref >59)
GFR, EST NON AFRICAN AMERICAN: 54 mL/min/{1.73_m2} — AB (ref >59)
Globulin, Total: 2.6 g/dL (ref 1.5–4.5)
Glucose: 136 mg/dL — ABNORMAL HIGH (ref 65–99)
Potassium: 5.7 mmol/L — ABNORMAL HIGH (ref 3.5–5.2)
Sodium: 144 mmol/L (ref 134–144)
Total Protein: 6.8 g/dL (ref 6.0–8.5)

## 2017-07-05 LAB — LIPID PANEL
CHOL/HDL RATIO: 2.5 ratio (ref 0.0–4.4)
Cholesterol, Total: 177 mg/dL (ref 100–199)
HDL: 72 mg/dL (ref >39)
LDL CALC: 87 mg/dL (ref 0–99)
TRIGLYCERIDES: 90 mg/dL (ref 0–149)
VLDL Cholesterol Cal: 18 mg/dL (ref 5–40)

## 2017-07-06 LAB — POTASSIUM: Potassium: 4.8 mmol/L (ref 3.5–5.2)

## 2017-07-07 ENCOUNTER — Encounter: Payer: Self-pay | Admitting: Nurse Practitioner

## 2017-07-07 ENCOUNTER — Ambulatory Visit (INDEPENDENT_AMBULATORY_CARE_PROVIDER_SITE_OTHER): Payer: Medicare Other | Admitting: Nurse Practitioner

## 2017-07-07 VITALS — BP 139/86 | HR 77 | Temp 97.7°F | Ht 64.0 in | Wt 223.0 lb

## 2017-07-07 DIAGNOSIS — R609 Edema, unspecified: Secondary | ICD-10-CM | POA: Diagnosis not present

## 2017-07-07 DIAGNOSIS — R600 Localized edema: Secondary | ICD-10-CM

## 2017-07-07 NOTE — Patient Instructions (Signed)
Edema Edema is when you have too much fluid in your body or under your skin. Edema may make your legs, feet, and ankles swell up. Swelling is also common in looser tissues, like around your eyes. This is a common condition. It gets more common as you get older. There are many possible causes of edema. Eating too much salt (sodium) and being on your feet or sitting for a long time can cause edema in your legs, feet, and ankles. Hot weather may make edema worse. Edema is usually painless. Your skin may look swollen or shiny. Follow these instructions at home:  Keep the swollen body part raised (elevated) above the level of your heart when you are sitting or lying down.  Do not sit still or stand for a long time.  Do not wear tight clothes. Do not wear garters on your upper legs.  Exercise your legs. This can help the swelling go down.  Wear elastic bandages or support stockings as told by your doctor.  Eat a low-salt (low-sodium) diet to reduce fluid as told by your doctor.  Depending on the cause of your swelling, you may need to limit how much fluid you drink (fluid restriction).  Take over-the-counter and prescription medicines only as told by your doctor. Contact a doctor if:  Treatment is not working.  You have heart, liver, or kidney disease and have symptoms of edema.  You have sudden and unexplained weight gain. Get help right away if:  You have shortness of breath or chest pain.  You cannot breathe when you lie down.  You have pain, redness, or warmth in the swollen areas.  You have heart, liver, or kidney disease and get edema all of a sudden.  You have a fever and your symptoms get worse all of a sudden. Summary  Edema is when you have too much fluid in your body or under your skin.  Edema may make your legs, feet, and ankles swell up. Swelling is also common in looser tissues, like around your eyes.  Raise (elevate) the swollen body part above the level of your  heart when you are sitting or lying down.  Follow your doctor's instructions about diet and how much fluid you can drink (fluid restriction). This information is not intended to replace advice given to you by your health care provider. Make sure you discuss any questions you have with your health care provider. Document Released: 05/03/2008 Document Revised: 12/03/2016 Document Reviewed: 12/03/2016 Elsevier Interactive Patient Education  2017 [employer]  

## 2017-07-07 NOTE — Progress Notes (Signed)
   Subjective:    Patient ID: Debra Campbell, female    DOB: [birth year], 83 y.o.   MRN: [id number]  HPI Patient was seen on 07/04/17 with peripheral edema- unna boots were applied and she was encouraged to take lasix daily and elevate legs. She is back today for recheck. She says she is doing well. Weight down 3 lbs.    Review of Systems  Constitutional: Negative.   Respiratory: Negative.   Cardiovascular: Negative.   Genitourinary: Negative.   Neurological: Negative.   Psychiatric/Behavioral: Negative.   All other systems reviewed and are negative.      Objective:   Physical Exam  Constitutional: She is oriented to person, place, and time. She appears well-developed and well-nourished.  Cardiovascular: Normal rate, regular rhythm and normal heart sounds.   Pulmonary/Chest: Effort normal and breath sounds normal.  Musculoskeletal: She exhibits edema (1+).  Neurological: She is alert and oriented to person, place, and time.  Skin: Skin is warm.  Psychiatric: She has a normal mood and affect. Her behavior is normal. Judgment and thought content normal.   BP 139/86   Pulse 77   Temp 97.7 F (36.5 C) (Oral)   Ht 5\' 4"  (1.626 m)   Wt 223 lb (101.2 kg)   BMI 38.28 kg/m      Assessment & Plan:  Peripheral edema wear compression hose daily Elevate  Legs when sitting Continue daily use  [name] Hassell Done, FNP

## 2017-07-12 ENCOUNTER — Other Ambulatory Visit: Payer: Self-pay | Admitting: Cardiology

## 2017-07-19 ENCOUNTER — Ambulatory Visit (INDEPENDENT_AMBULATORY_CARE_PROVIDER_SITE_OTHER): Payer: Medicare Other | Admitting: Cardiology

## 2017-07-19 ENCOUNTER — Encounter: Payer: Self-pay | Admitting: Cardiology

## 2017-07-19 VITALS — BP 130/70 | HR 76 | Ht 64.0 in | Wt 222.0 lb

## 2017-07-19 DIAGNOSIS — E7849 Other hyperlipidemia: Secondary | ICD-10-CM

## 2017-07-19 DIAGNOSIS — I10 Essential (primary) hypertension: Secondary | ICD-10-CM

## 2017-07-19 DIAGNOSIS — I481 Persistent atrial fibrillation: Secondary | ICD-10-CM | POA: Diagnosis not present

## 2017-07-19 DIAGNOSIS — E784 Other hyperlipidemia: Secondary | ICD-10-CM | POA: Diagnosis not present

## 2017-07-19 DIAGNOSIS — I700 Atherosclerosis of aorta: Secondary | ICD-10-CM | POA: Diagnosis not present

## 2017-07-19 DIAGNOSIS — I4819 Other persistent atrial fibrillation: Secondary | ICD-10-CM

## 2017-07-19 NOTE — Patient Instructions (Signed)
Your physician recommends that you schedule a follow-up appointment [facility] TO DISCUSS THE BEST ANTICOAGULANT FOR PATIENT.     Your physician wants you to follow-up in [place].You will receive a reminder letter in the mail two months in advance. If you don't receive a letter, please call our office to schedule the follow-up appointment.

## 2017-07-19 NOTE — Progress Notes (Signed)
PCP: Chevis Pretty, FNP  Clinic Note: Chief Complaint  Patient presents with  . Follow-up    no concerns  . Atrial Fibrillation    HPI: Debra Campbell is a 83 y.o. female with a PMH below who presents today for Six-month follow-up of atrial fibrillation as a new onset diagnosis back in November 2017 when I last saw her. CHA2DS2-VASc Score and unadjusted Ischemic Stroke Rate (% per year) is equal to 7.2 % stroke rate/year from a score of 5 (HTN, DM, Female, Age (2)).   Debra Campbell was last seen on 01/11/2017 by Bernerd Pho, [place]. She was supposed to be started on Xarelto, but for unclear reasons, the was never started. Apparently there was initially concern about bleeding, then there is concern about cost. She is supposedly then switched ELIQUIS but that did not happen. She has only been on aspirin.  Recent Hospitalizations:   Left knee surgery/replacement January 2018  Studies Personally Reviewed - (if available, images/films reviewed: From Epic Chart or Care Everywhere)  None  Interval History: Ms. Beutler presents today feeling fine. She has no complaints whatsoever from a cardiac standpoint. She has no sensation whatsoever of whether or not she is in atrial fibrillation or not. She denies any rapid irregular heartbeats or palpitations symptoms. She has no chest tightness pressure with rest or exertion. Partially because she is not very active, she does get a little bit short of breath if she exerts herself, but she doesn't exert herself. She sleeps in a chair because of what sounds like GERD but also shoulder pain. Therefore is difficult to tell if she has any orthopnea or PND. She does have bilateral lower extremity edema that has been chronic.  No lightheadedness, dizziness, weakness or syncope/near syncope -- just some mild positional dizziness No TIA/amaurosis fugax symptoms. No melena, hematochezia, hematuria, or epstaxis. No claudication.  ROS: A comprehensive  was performed. Review of Systems  Constitutional: Negative for malaise/fatigue.  HENT: Negative for congestion.   Respiratory: Negative for cough, shortness of breath and wheezing.   Gastrointestinal: Positive for heartburn. Negative for abdominal pain, nausea and vomiting.  Genitourinary: Negative for frequency and hematuria.  Musculoskeletal: Positive for back pain and joint pain (Shoulder pain).  Psychiatric/Behavioral: Positive for memory loss (She seems to be having some, but does not admit to it).  All other systems reviewed and are negative.  I have reviewed and (if needed) personally updated the patient's problem list, medications, allergies, past medical and surgical history, social and family history.   Past Medical History:  Diagnosis Date  . Aortic calcification ([place]) 2013   Atherosclerotic calcifications of the abdominal aorta and branch noted on CT Abd/Pelvis  . Arthritis    legs, lower back, hands  . Cancer ([place])    endometrial  . Diabetes mellitus   . Ectopic pregnancy   . Eczema   . Headache(784.0)    hx of  . History of kidney stones   . Hyperlipidemia    no meds  . Hypertension   . Hypothyroidism   . Persistent atrial fibrillation ([place]) 09/28/2016   Relatively new Dx: Asymptomatic.  Rate controlled without medication.  . SVD (spontaneous vaginal delivery)    x 3  . Thyroid disease     Past Surgical History:  Procedure Laterality Date  . ABDOMINAL HYSTERECTOMY Bilateral 06/26/2013   Procedure: TOTAL ABDOMINAL HYSTERECTOMY WITH BILATERAL SALPINGO OOPHERECTOMY WITH PELVIC LYMPHADNECTOMY;  Surgeon: Alvino Chapel, MD;  Location: [facility];  Service: Gynecology;  Laterality: Bilateral;  . BACK SURGERY  06/26/2013  . ECTOPIC PREGNANCY SURGERY    . HERNIA REPAIR    . HYSTEROSCOPY W/D&C N/A 04/26/2013   Procedure: DILATATION AND CURETTAGE /HYSTEROSCOPY;  Surgeon: Woodroe Mode, MD;  Location: [facility];  Service: Gynecology;  Laterality: N/A;  . JOINT  REPLACEMENT    . LAPAROTOMY     for ectopic pregnancy  . LAPAROTOMY N/A 06/26/2013   Procedure: EXPLORATORY LAPAROTOMY;  Surgeon: Alvino Chapel, MD;  Location: [facility];  Service: Gynecology;  Laterality: N/A;  . lt knee arthroscopy    . rt total hip replacement    . TONSILLECTOMY    . TOTAL KNEE ARTHROPLASTY Left 12/22/2016   Procedure: LEFT TOTAL KNEE ARTHROPLASTY;  Surgeon: Latanya Maudlin, MD;  Location: [facility];  Service: Orthopedics;  Laterality: Left;  . WISDOM TOOTH EXTRACTION      Current Meds  Medication Sig  . acetaminophen (TYLENOL) 500 MG tablet Take 500 mg by mouth every 6 (six) hours as needed.  [name] aspirin 81 MG tablet Take 81 mg by mouth daily.   . Elastic Bandages & Supports (V-2 HIGH COMPRESSION HOSE) MISC Wear daily when up on feet  . furosemide (LASIX) 40 MG tablet Take 1 tablet (40 mg total) by mouth daily.  [name] gabapentin (NEURONTIN) 100 MG capsule Take 300 mg by mouth at bedtime.  [name] glucose blood (ACCU-CHEK AVIVA PLUS) test strip Test 1x per day and prn. [id number]  . levothyroxine (SYNTHROID, LEVOTHROID) 175 MCG tablet Take 1 tablet (175 mcg total) by mouth daily before breakfast.  . losartan (COZAAR) 50 MG tablet TAKE 1 TABLET (50 MG TOTAL) BY MOUTH AT BEDTIME.  . metFORMIN (GLUCOPHAGE) 1000 MG tablet TAKE 1 TABLET TWICE DAILY WITH A MEAL  . methocarbamol (ROBAXIN) 500 MG tablet Take 1 tablet (500 mg total) by mouth every 6 (six) hours as needed for muscle spasms.  . metoprolol succinate (TOPROL-XL) 25 MG 24 hr tablet TAKE 1 TABLET EVERY DAY  . Multiple Vitamin (MULTIVITAMIN WITH MINERALS) TABS Take 1 tablet by mouth daily.  [name] triamcinolone cream (KENALOG) 0.5 % Apply 1 application topically 3 (three) times daily as needed.  . TRUEPLUS LANCETS 30G MISC CHECK BLOOD SUGAR ONCE A DAY AND AS NEEDED    Allergies  Allergen Reactions  . Ace Inhibitors Cough  . Iohexol      Desc: HIVES 40 YEARS AGO   . Statins     myalgia  . Iodine Rash  . Penicillins Swelling  and Rash    Has patient had a PCN reaction causing immediate rash, facial/tongue/throat swelling, SOB or lightheadedness with hypotension: Yes Has patient had a PCN reaction causing severe rash involving mucus membranes or skin necrosis: No Has patient had a PCN reaction that required hospitalization Yes Has patient had a PCN reaction occurring within the last 10 years: No If all of the above answers are "NO", then may proceed with Cephalosporin use.   . Sulfa Antibiotics Rash    Social History   Social History  . Marital status: Married    Spouse name: N/A  . Number of children: N/A  . Years of education: N/A   Social History Main Topics  . Smoking status: Never Smoker  . Smokeless tobacco: Never Used  . Alcohol use No  . Drug use: No  . Sexual activity: No   Other Topics Concern  . None   Social History Narrative  . None    family history includes Arthritis in  her mother; Breast cancer in her daughter and sister; Cancer in her daughter and sister; Heart attack in her father; Heart disease in her father and sister; Neuropathy in her sister; Suicidality (age of onset: 71) in her brother; Thyroid disease in her father.  Wt Readings from Last 3 Encounters:  07/19/17 222 lb (100.7 kg)  07/07/17 223 lb (101.2 kg)  07/04/17 226 lb (102.5 kg)    PHYSICAL EXAM BP 130/70   Pulse 76   Ht 5' 4"  (1.626 m)   Wt 222 lb (100.7 kg)   BMI 38.11 kg/m  Physical Exam  Constitutional: She is oriented to person, place, and time. She appears well-developed. No distress.  Obese  HENT:  Head: Normocephalic and atraumatic.  Hard of hearing  Neck: Normal range of motion. No hepatojugular reflux and no JVD present. Carotid bruit is not present. No tracheal deviation present. No thyromegaly present.  Cardiovascular: Normal rate, normal heart sounds and normal pulses.  An irregularly irregular rhythm present. PMI is not displaced.  Exam reveals no gallop.   No murmur  heard. Pulmonary/Chest: Effort normal and breath sounds normal. No respiratory distress. She has no wheezes. She has no rales.  Abdominal: Soft. Bowel sounds are normal. She exhibits no distension. There is no tenderness. There is no rebound.  Obese  Musculoskeletal: Normal range of motion. She exhibits edema (2+ bilateral lower extremity with mild stasis changes).  Left knee is still somewhat stiff with somewhat antalgic gait, but notably improved from when I saw her last year. (Now is postop)  Neurological: She is alert and oriented to person, place, and time.  Skin: Skin is warm and dry. No rash noted. No erythema.  Psychiatric: She has a normal mood and affect. Her behavior is normal.  She has a very hard time understanding and grasping the concept of when transplanted a day. Insight and judgment appeared to be somewhat diminished.  Nursing note and vitals reviewed.    Adult ECG Report  Rate: 76 ;  Rhythm: atrial fibrillation and Otherwise normal axis, intervals and durations. Nonspecific ST and T-wave changes.;   Narrative Interpretation: Stable EKG   Other studies Reviewed: Additional studies/ records that were reviewed today include:  Recent Labs:   Lab Results  Component Value Date   CREATININE 0.98 07/04/2017   BUN 13 07/04/2017   NA 144 07/04/2017   K 4.8 07/05/2017   CL 103 07/04/2017   CO2 27 07/04/2017   Lab Results  Component Value Date   CHOL 177 07/04/2017   HDL 72 07/04/2017   LDLCALC 87 07/04/2017   TRIG 90 07/04/2017   CHOLHDL 2.5 07/04/2017    ASSESSMENT / PLAN: Problem List Items Addressed This Visit    Aortic calcification ([facility]) (Chronic)    Aortic calcification not unexpected in her age with diabetes, hypertension and hyperlipidemia. This adds another point to her CHA2DS2-VASc Score (NOT PREVIOUSLY NOTED).  No evidence of AAA - probably do not need repeat screening evaluation at this time, but may consider Abd Aortic Doppler if she indicates  interest.      Essential hypertension (Chronic)    Well-controlled on current meds      Hyperlipidemia (Chronic)    This is been monitored by her PCP. She is not currently on any medications for it.  She actually seems pretty well-controlled lipids without medications.      Morbid obesity ([place]) (Chronic)    She really does not seem to have lost any weight since I saw  her last year. I was hoping that once her knee was operated on that she will be on a walk and then start losing weight. We talked about the importance of trying to stay active.      Persistent atrial fibrillation ([facility]CC) - Primary (Chronic)    This patients CHA2DS2-VASc Score and unadjusted Ischemic Stroke Rate (% per year) is equal to 9.7 % stroke rate/year from a score of 6  Above score calculated as 1 point each if present [CHF, HTN, DM, Vascular=MI/PAD/Aortic Plaque, Age if 65-74, or Female];  2 points each if present [Age > 75, or Stroke/TIA/TE]  Based on this risk, I strongly recommended DOAC. I don't really understand all the reasoning behind why she isn't on it. She tells me that her pharmacist told her one pain, then she was told that it was a B-2 expensive and several other issues. I spent close to 30 minutes talking with her today about atrial fibrillation and the reasons for recommending stroke prevention. I went to her CHA2DS2-VASc Score as well as her HAS BLED score and the difference between the 2 indicating significant benefit from anticoagulation. I talked her about what types of bleeding there are and that really the only severe life-threatening bleed is a head bleed.  However on simpler note, she is totally asymptomatic with the A. fib and relatively well rate controlled on low-dose beta blocker. We decided when I first met her that we would not pursue an ischemic evaluation.  Plan: I continue beta blocker and aspirin for now. I will have her follow-up with our clinical pharmacists in our Cardiovascular Risk  Reduction clinic here in the office. - They can help talk to her about pros and cons of DOAC (she is adamant about not taking warfarin). Can also help her look into the financial aspects of these medications.       Relevant Orders   EKG 12-Lead (Completed)      Over 40 minutes was spent with the patient and her niece today. Well over 75% of the time was spent in direct patient counseling. Discussing pathophysiology of atrial fibrillation as well as concern for stroke prevention. We reviewed both CHA2DS2-VASc Score  and HAS BLED scores.  Current medicines are reviewed at length with the patient today. (+/- concerns)  - concerns about taking a new medication The following changes have been made: see below  Patient Instructions  Your physician recommends that you schedule a follow-up appointment [facility] TO DISCUSS THE BEST ANTICOAGULANT FOR PATIENT.     Your physician wants you to follow-up in [place].You will receive a reminder letter in the mail two months in advance. If you don't receive a letter, please call our office to schedule the follow-up appointment.     Studies Ordered:   Orders Placed This Encounter  Procedures  . EKG 12-Lead      Glenetta Hew, M.D., M.S. Interventional Cardiologist   Pager # [phone] Phone # [phone] [address] [place], [place] [address]

## 2017-07-21 ENCOUNTER — Encounter: Payer: Self-pay | Admitting: Cardiology

## 2017-07-21 NOTE — Assessment & Plan Note (Signed)
Well-controlled on current meds 

## 2017-07-21 NOTE — Assessment & Plan Note (Signed)
This is been monitored by her PCP. She is not currently on any medications for it.  She actually seems pretty well-controlled lipids without medications.

## 2017-07-21 NOTE — Assessment & Plan Note (Signed)
She really does not seem to have lost any weight since I saw her last year. I was hoping that once her knee was operated on that she will be on a walk and then start losing weight. We talked about the importance of trying to stay active.

## 2017-07-21 NOTE — Assessment & Plan Note (Signed)
This patients CHA2DS2-VASc Score and unadjusted Ischemic Stroke Rate (% per year) is equal to 9.7 % stroke rate/year from a score of 6  Above score calculated as 1 point each if present [CHF, HTN, DM, Vascular=MI/PAD/Aortic Plaque, Age if 65-74, or Female];  2 points each if present [Age > 75, or Stroke/TIA/TE]  Based on this risk, I strongly recommended DOAC. I don't really understand all the reasoning behind why she isn't on it. She tells me that her pharmacist told her one pain, then she was told that it was a B-2 expensive and several other issues. I spent close to 30 minutes talking with her today about atrial fibrillation and the reasons for recommending stroke prevention. I went to her CHA2DS2-VASc Score as well as her HAS BLED score and the difference between the 2 indicating significant benefit from anticoagulation. I talked her about what types of bleeding there are and that really the only severe life-threatening bleed is a head bleed.  However on simpler note, she is totally asymptomatic with the A. fib and relatively well rate controlled on low-dose beta blocker. We decided when I first met her that we would not pursue an ischemic evaluation.  Plan: I continue beta blocker and aspirin for now. I will have her follow-up with our clinical pharmacists in our Cardiovascular Risk Reduction clinic here in the office. - They can help talk to her about pros and cons of DOAC (she is adamant about not taking warfarin). Can also help her look into the financial aspects of these medications.

## 2017-07-21 NOTE — Assessment & Plan Note (Signed)
Aortic calcification not unexpected in her age with diabetes, hypertension and hyperlipidemia. This adds another point to her CHA2DS2-VASc Score (NOT PREVIOUSLY NOTED).  No evidence of AAA - probably do not need repeat screening evaluation at this time, but may consider Abd Aortic Doppler if she indicates interest.

## 2017-07-21 NOTE — Addendum Note (Signed)
Addendum  created 07/21/17 1016 by Roberts Gaudy, MD   Sign clinical note

## 2017-07-25 ENCOUNTER — Other Ambulatory Visit: Payer: Self-pay | Admitting: Nurse Practitioner

## 2017-07-28 ENCOUNTER — Ambulatory Visit (INDEPENDENT_AMBULATORY_CARE_PROVIDER_SITE_OTHER): Payer: Medicare Other | Admitting: Pharmacist Clinician (PhC)/ Clinical Pharmacy Specialist

## 2017-07-28 ENCOUNTER — Encounter: Payer: Self-pay | Admitting: Pharmacist Clinician (PhC)/ Clinical Pharmacy Specialist

## 2017-07-28 DIAGNOSIS — I481 Persistent atrial fibrillation: Secondary | ICD-10-CM

## 2017-07-28 DIAGNOSIS — I4819 Other persistent atrial fibrillation: Secondary | ICD-10-CM

## 2017-07-28 MED ORDER — RIVAROXABAN 20 MG PO TABS: 20.0000 mg | ORAL_TABLET | Freq: Every day | ORAL | 0 refills | Status: DC

## 2017-07-28 NOTE — Progress Notes (Signed)
Pt was started on Xarelto for atrial fibrillation today      Reviewed patients medication list.  Pt is not currently on any combined P-gp and strong CYP3A4 inhibitors/inducers (ketoconazole, traconazole, ritonavir, carbamazepine, phenytoin, rifampin, St. John's wort).  Reviewed labs.  SCr 0.98 Weight 100kg, CrCl- 68.66.  Dose appropriate based on CrCl.   Hgb and HCT WNL  A full discussion of the nature of anticoagulants has been carried out.  A benefit/risk analysis has been presented to the patient, so that they understand the justification for choosing anticoagulation with Xarelto at this time.  The need for compliance is stressed.  Pt is aware to take the medication once daily with the largest meal of the day.  Side effects of potential bleeding are discussed, including unusual colored urine or stools, coughing up blood or coffee ground emesis, nose bleeds or serious fall or head trauma.  Discussed signs and symptoms of stroke. The patient should avoid any OTC items containing aspirin or ibuprofen.  Avoid alcohol consumption.   Call if any signs of abnormal bleeding.  Discussed financial obligations and resolved any difficulty in obtaining medication.  Next lab test in 6 months.

## 2017-08-05 ENCOUNTER — Other Ambulatory Visit: Payer: Self-pay

## 2017-08-10 ENCOUNTER — Other Ambulatory Visit: Payer: Self-pay | Admitting: Nurse Practitioner

## 2017-08-10 NOTE — Telephone Encounter (Signed)
What is the name of the medication? Gabapentin 300mg  capsule  Have you contacted your pharmacy to request a refill? yes  Which pharmacy would you like this sent to? [facility], but patient also needs a short term supply sent to local pharmacy she has three days on hand [place] and home care    Patient notified that their request is being sent to the clinical staff for review and that they should receive a call once it is complete. If they do not receive a call within 24 hours they can check with their pharmacy or our office.

## 2017-08-11 ENCOUNTER — Other Ambulatory Visit: Payer: Self-pay | Admitting: Nurse Practitioner

## 2017-08-11 MED ORDER — GABAPENTIN 100 MG PO CAPS: 300.0000 mg | ORAL_CAPSULE | Freq: Every day | ORAL | 1 refills | Status: DC

## 2017-09-05 ENCOUNTER — Telehealth: Payer: Self-pay | Admitting: Cardiology

## 2017-09-05 ENCOUNTER — Telehealth: Payer: Self-pay

## 2017-09-05 NOTE — Telephone Encounter (Signed)
Patient calling the office for samples of medication: ° ° °1.  What medication and dosage are you requesting samples for? Xarelto 20 mg ° °2.  Are you currently out of this medication? yes ° ° °

## 2017-09-05 NOTE — Telephone Encounter (Signed)
°*  STAT* If patient is at the pharmacy, call can be transferred to refill team.   1. Which medications need to be refilled? (please list name of each medication and dose if known) Xarelto 20 mg    2. Which pharmacy/location (including street and city if local pharmacy) is medication to be sent to?[facility]   3. Do they need a 30 day or 90 day supply? [place]

## 2017-09-05 NOTE — Telephone Encounter (Signed)
I left a message for the patient oon her machine stating her 20 mg Xarelto samples were left up front.

## 2017-09-06 ENCOUNTER — Other Ambulatory Visit: Payer: Self-pay | Admitting: Nurse Practitioner

## 2017-09-06 DIAGNOSIS — R609 Edema, unspecified: Secondary | ICD-10-CM

## 2017-09-23 ENCOUNTER — Telehealth: Payer: Self-pay | Admitting: Cardiology

## 2017-09-23 MED ORDER — RIVAROXABAN 20 MG PO TABS: 20.0000 mg | ORAL_TABLET | Freq: Every day | ORAL | 1 refills | Status: DC

## 2017-09-23 NOTE — Telephone Encounter (Signed)
°  New Prob  Calling to follow up on this request. Requesting a call back regarding this medication.    *STAT* If patient is at the pharmacy, call can be transferred to refill team.   1. Which medications need to be refilled? (please list name of each medication and dose if known)  Xarelto 20 mg once daily  2. Which pharmacy/location (including street and city if local pharmacy) is medication to be sent to?  [facility]  3. Do they need a 30 day or 90 day supply? [place]

## 2017-10-06 ENCOUNTER — Ambulatory Visit (INDEPENDENT_AMBULATORY_CARE_PROVIDER_SITE_OTHER): Payer: Medicare Other | Admitting: Nurse Practitioner

## 2017-10-06 ENCOUNTER — Encounter: Payer: Self-pay | Admitting: Nurse Practitioner

## 2017-10-06 VITALS — BP 122/77 | HR 81 | Temp 97.0°F | Ht 64.0 in | Wt 228.0 lb

## 2017-10-06 DIAGNOSIS — E034 Atrophy of thyroid (acquired): Secondary | ICD-10-CM

## 2017-10-06 DIAGNOSIS — I10 Essential (primary) hypertension: Secondary | ICD-10-CM

## 2017-10-06 DIAGNOSIS — R609 Edema, unspecified: Secondary | ICD-10-CM

## 2017-10-06 DIAGNOSIS — I700 Atherosclerosis of aorta: Secondary | ICD-10-CM | POA: Diagnosis not present

## 2017-10-06 DIAGNOSIS — E7849 Other hyperlipidemia: Secondary | ICD-10-CM | POA: Diagnosis not present

## 2017-10-06 DIAGNOSIS — E1142 Type 2 diabetes mellitus with diabetic polyneuropathy: Secondary | ICD-10-CM

## 2017-10-06 DIAGNOSIS — R600 Localized edema: Secondary | ICD-10-CM

## 2017-10-06 DIAGNOSIS — I4819 Other persistent atrial fibrillation: Secondary | ICD-10-CM

## 2017-10-06 DIAGNOSIS — M8588 Other specified disorders of bone density and structure, other site: Secondary | ICD-10-CM

## 2017-10-06 DIAGNOSIS — Z23 Encounter for immunization: Secondary | ICD-10-CM

## 2017-10-06 DIAGNOSIS — I481 Persistent atrial fibrillation: Secondary | ICD-10-CM | POA: Diagnosis not present

## 2017-10-06 LAB — LIPID PANEL
CHOLESTEROL TOTAL: 195 mg/dL (ref 100–199)
Chol/HDL Ratio: 3.2 ratio (ref 0.0–4.4)
HDL: 61 mg/dL (ref >39)
LDL CALC: 107 mg/dL — AB (ref 0–99)
TRIGLYCERIDES: 134 mg/dL (ref 0–149)
VLDL Cholesterol Cal: 27 mg/dL (ref 5–40)

## 2017-10-06 LAB — CMP14+EGFR
A/G RATIO: 1.7 (ref 1.2–2.2)
ALBUMIN: 4 g/dL (ref 3.5–4.7)
ALK PHOS: 47 IU/L (ref 39–117)
ALT: 12 IU/L (ref 0–32)
AST: 17 IU/L (ref 0–40)
BILIRUBIN TOTAL: 0.6 mg/dL (ref 0.0–1.2)
BUN / CREAT RATIO: 12 (ref 12–28)
BUN: 11 mg/dL (ref 8–27)
CHLORIDE: 103 mmol/L (ref 96–106)
CO2: 25 mmol/L (ref 20–29)
Calcium: 9.2 mg/dL (ref 8.7–10.3)
Creatinine, Ser: 0.92 mg/dL (ref 0.57–1.00)
GFR calc Af Amer: 67 mL/min/{1.73_m2} (ref >59)
GFR calc non Af Amer: 58 mL/min/{1.73_m2} — ABNORMAL LOW (ref >59)
GLOBULIN, TOTAL: 2.4 g/dL (ref 1.5–4.5)
Glucose: 162 mg/dL — ABNORMAL HIGH (ref 65–99)
POTASSIUM: 3.7 mmol/L (ref 3.5–5.2)
SODIUM: 142 mmol/L (ref 134–144)
Total Protein: 6.4 g/dL (ref 6.0–8.5)

## 2017-10-06 LAB — BAYER DCA HB A1C WAIVED: HB A1C (BAYER DCA - WAIVED): 6.4 % (ref <7.0)

## 2017-10-06 MED ORDER — FUROSEMIDE 40 MG PO TABS: 40.0000 mg | ORAL_TABLET | Freq: Two times a day (BID) | ORAL | 1 refills | Status: DC

## 2017-10-06 MED ORDER — LEVOTHYROXINE SODIUM 175 MCG PO TABS: 175.0000 ug | ORAL_TABLET | Freq: Every day | ORAL | 1 refills | Status: DC

## 2017-10-06 MED ORDER — METFORMIN HCL 1000 MG PO TABS: ORAL_TABLET | ORAL | 1 refills | Status: DC

## 2017-10-06 NOTE — Patient Instructions (Signed)

## 2017-10-06 NOTE — Progress Notes (Signed)
Subjective:    Patient ID: Debra Campbell, female    DOB: Apr 03, 1932, 83 y.o.   MRN: [id number]  HPI Debra Campbell is here today for follow up of chronic medical problem.  Outpatient Encounter Medications as of 10/06/2017  Medication Sig  . acetaminophen (TYLENOL) 500 MG tablet Take 500 mg by mouth every 6 (six) hours as needed.  [name] aspirin 81 MG tablet Take 81 mg by mouth daily.   . Elastic Bandages & Supports (V-2 HIGH COMPRESSION HOSE) MISC Wear daily when up on feet  . furosemide (LASIX) 20 MG tablet TAKE 1 TABLET EVERY DAY  . furosemide (LASIX) 40 MG tablet TAKE 1 TABLET EVERY DAY  . gabapentin (NEURONTIN) 100 MG capsule Take 3 capsules (300 mg total) by mouth at bedtime.  . gabapentin (NEURONTIN) 300 MG capsule TAKE 1 CAPSULE AT BEDTIME  . glucose blood (ACCU-CHEK AVIVA PLUS) test strip Test 1x per day and prn. DX.E11.9  . levothyroxine (SYNTHROID, LEVOTHROID) 175 MCG tablet Take 1 tablet (175 mcg total) by mouth daily before breakfast.  . losartan (COZAAR) 50 MG tablet TAKE 1 TABLET (50 MG TOTAL) BY MOUTH AT BEDTIME.  . metFORMIN (GLUCOPHAGE) 1000 MG tablet TAKE 1 TABLET TWICE DAILY WITH A MEAL  . methocarbamol (ROBAXIN) 500 MG tablet Take 1 tablet (500 mg total) by mouth every 6 (six) hours as needed for muscle spasms.  . metoprolol succinate (TOPROL-XL) 25 MG 24 hr tablet TAKE 1 TABLET EVERY DAY  . Multiple Vitamin (MULTIVITAMIN WITH MINERALS) TABS Take 1 tablet by mouth daily.  . rivaroxaban (XARELTO) 20 MG TABS tablet Take 1 tablet (20 mg total) by mouth daily with breakfast.  . triamcinolone cream (KENALOG) 0.5 % Apply 1 application topically 3 (three) times daily as needed.  . TRUEPLUS LANCETS 30G MISC CHECK BLOOD SUGAR ONCE A DAY AND AS NEEDED   No facility-administered encounter medications on file as of 10/06/2017.     1. Persistent atrial fibrillation ([place])  She denies feeling palpitations. She is on xeralto with out any side effects  2. Essential hypertension  No  c/o chest pain, sob  Or headaches. Does not check blood pressures at home. BP Readings from Last 3 Encounters:  07/19/17 130/70  07/07/17 139/86  07/04/17 137/81     3. Aortic calcification (HCC)  No problems that she is aware of   4. Type 2 diabetes mellitus with diabetic polyneuropathy, without long-term current use of insulin ([place]) last hgba1c was 6.3% . She does not check her blood sugars every day. When she does check them they are around 140-160 fasting. No hypoglycemic episodes.  5. Hypothyroidism due to acquired atrophy of thyroid  No problems  6. Diabetic polyneuropathy associated with type 2 diabetes mellitus (HCC)   has burning in feet all the time is on neurontin daily which helps  7. Morbid obesity ([place])  No recent weight changes  8. Other hyperlipidemia  Does not watch diet very closely  9. Osteopenia of spine  Last dexa was in 2015    New complaints: None today  Social history: Lives with husband of 36 years- they are both doing pretty good.  Review of Systems  Constitutional: Negative for activity change and appetite change.  HENT: Negative.   Eyes: Negative for pain.  Respiratory: Negative for shortness of breath.   Cardiovascular: Negative for chest pain, palpitations and leg swelling.  Gastrointestinal: Negative for abdominal pain.  Endocrine: Negative for polydipsia.  Genitourinary: Negative.   Skin: Negative for  rash.  Neurological: Negative for dizziness, weakness and headaches.  Hematological: Does not bruise/bleed easily.  Psychiatric/Behavioral: Negative.   All other systems reviewed and are negative.      Objective:   Physical Exam  Constitutional: She is oriented to person, place, and time. She appears well-developed and well-nourished.  HENT:  Nose: Nose normal.  Mouth/Throat: Oropharynx is clear and moist.  Eyes: EOM are normal.  Neck: Trachea normal, normal range of motion and full passive range of motion without pain. Neck supple. No  JVD present. Carotid bruit is not present. No thyromegaly present.  Cardiovascular: Normal rate, regular rhythm, normal heart sounds and intact distal pulses. Exam reveals no gallop and no friction rub.  No murmur heard. Pulmonary/Chest: Effort normal and breath sounds normal.  Abdominal: Soft. Bowel sounds are normal. She exhibits no distension and no mass. There is no tenderness.  Musculoskeletal: Normal range of motion. She exhibits edema (2+ edema bil lowe ext and feet).  Lymphadenopathy:    She has no cervical adenopathy.  Neurological: She is alert and oriented to person, place, and time. She has normal reflexes.  Skin: Skin is warm and dry.  Psychiatric: She has a normal mood and affect. Her behavior is normal. Judgment and thought content normal.   BP 122/77   Pulse 81   Temp (!) 97 F (36.1 C) (Oral)   Ht 5' 4" (1.626 m)   Wt 228 lb (103.4 kg)   BMI 39.14 kg/m   hgba1c 6.4% today      Assessment & Plan:  1. Persistent atrial fibrillation (HCC) Continue xeralto as rx  2. Essential hypertension Low sodium diet - CMP14+EGFR  3. Aortic calcification (HCC) Will continue to watch  4. Type 2 diabetes mellitus with diabetic polyneuropathy, without long-term current use of insulin (HCC) Continue to watch carbs in diet - Bayer DCA Hb A1c Waived - Microalbumin / creatinine urine ratio - metFORMIN (GLUCOPHAGE) 1000 MG tablet; TAKE 1 TABLET TWICE DAILY WITH A MEAL  Dispense: 180 tablet; Refill: 1  5. Hypothyroidism due to acquired atrophy of thyroid - levothyroxine (SYNTHROID, LEVOTHROID) 175 MCG tablet; Take 1 tablet (175 mcg total) daily before breakfast by mouth.  Dispense: 90 tablet; Refill: 1  6. Diabetic polyneuropathy associated with type 2 diabetes mellitus ([place]) Do not go barefooted  7. Morbid obesity ([place]) Discussed diet and exercise for person with BMI >25 Will recheck weight in 3-6 months  8. Other hyperlipidemia Low fat diet - Lipid panel  9.  Osteopenia of spine Weight bearing exercises if can tolerate  10. Peripheral edema Elevate legs when sitting Continue compression hose Increased lasix to bid - furosemide (LASIX) 40 MG tablet; Take 1 tablet (40 mg total) 2 (two) times daily by mouth.  Dispense: 180 tablet; Refill: 1   encourgaed to have eye exam Labs pending Health maintenance reviewed Diet and exercise encouraged Continue all meds Follow up  In 3 months   [place], FNP

## 2017-10-12 ENCOUNTER — Ambulatory Visit (INDEPENDENT_AMBULATORY_CARE_PROVIDER_SITE_OTHER): Payer: Medicare Other | Admitting: Family Medicine

## 2017-10-12 ENCOUNTER — Encounter: Payer: Self-pay | Admitting: Family Medicine

## 2017-10-12 VITALS — BP 135/75 | HR 110 | Temp 97.7°F | Resp 20 | Ht 64.0 in | Wt 229.0 lb

## 2017-10-12 DIAGNOSIS — J069 Acute upper respiratory infection, unspecified: Secondary | ICD-10-CM

## 2017-10-12 MED ORDER — BETAMETHASONE SOD PHOS & ACET 6 (3-3) MG/ML IJ SUSP
6.0000 mg | Freq: Once | INTRAMUSCULAR | Status: AC
  Administered 2017-10-12: 6 mg via INTRAMUSCULAR

## 2017-10-12 MED ORDER — GUAIFENESIN-CODEINE 100-10 MG/5ML PO SYRP: 5.0000 mL | ORAL_SOLUTION | Freq: Three times a day (TID) | ORAL | 0 refills | Status: DC | PRN

## 2017-10-12 MED ORDER — AZITHROMYCIN 250 MG PO TABS: ORAL_TABLET | ORAL | 0 refills | Status: DC

## 2017-10-12 NOTE — Progress Notes (Signed)
Chief Complaint  Patient presents with  . URI    chest congestion, prod cough    HPI  Patient presents today for Patient presents with coughing frequently purulent, blood tinged sputum noted. There is no fever, chills or sweats. The patient reports being moderately short of breath. Onset was 5 days ago. Worsening. No upper respiratory congestion. No rhinorrhea or sore throat.Tried OTCs without improvement.  PMH: Smoking status noted  ROS: Per HPI  Objective: BP 135/75 (BP Location: Left Arm, Patient Position: Sitting, Cuff Size: Large)   Pulse (!) 110   Temp 97.7 F (36.5 C) (Oral)   Resp 20   Ht 5\' 4"  (1.626 m)   Wt 229 lb (103.9 kg)   SpO2 97%   BMI 39.31 kg/m  Gen: NAD, alert, cooperative with exam HEENT: NCAT, Nasal passages clear CV: RRR, good S1/S2, no murmur Resp: Bronchitis changes with scattered wheezes, non-labored Ext: No edema, warm Neuro: Alert and oriented, No gross deficits  Assessment and plan:  1. URI, acute     Meds ordered this encounter  Medications  . guaiFENesin-codeine (CHERATUSSIN AC) 100-10 MG/5ML syrup    Sig: Take 5 mLs 3 (three) times daily as needed by mouth for cough.    Dispense:  120 mL    Refill:  0  . azithromycin (ZITHROMAX Z-PAK) 250 MG tablet    Sig: Take 2 tablets day one then 1 tablet daily until finished    Dispense:  6 each    Refill:  0  . betamethasone acetate-betamethasone sodium phosphate (CELESTONE) injection 6 mg    No orders of the defined types were placed in this encounter.   Follow up as needed.  Claretta Fraise, MD

## 2017-10-26 ENCOUNTER — Ambulatory Visit (INDEPENDENT_AMBULATORY_CARE_PROVIDER_SITE_OTHER): Payer: Medicare Other | Admitting: *Deleted

## 2017-10-26 ENCOUNTER — Encounter: Payer: Self-pay | Admitting: *Deleted

## 2017-10-26 VITALS — BP 125/75 | HR 89 | Ht 63.0 in | Wt 222.0 lb

## 2017-10-26 DIAGNOSIS — Z0000 Encounter for general adult medical examination without abnormal findings: Secondary | ICD-10-CM

## 2017-10-26 DIAGNOSIS — R600 Localized edema: Secondary | ICD-10-CM

## 2017-10-26 DIAGNOSIS — R609 Edema, unspecified: Secondary | ICD-10-CM

## 2017-10-26 NOTE — Patient Instructions (Signed)
  Debra Campbell , Thank you for taking time to come for your Medicare Wellness Visit. I appreciate your ongoing commitment to your health goals. Please review the following plan we discussed and let me know if I can assist you in the future.   These are the goals we discussed: Goals    . HEMOGLOBIN A1C < 7.0      Increase non-starchy vegetables - carrots, green bean, squash, zucchini, tomatoes, onions, peppers, spinach and other green leafy vegetables, cabbage, lettuce, cucumbers, asparagus, okra (not fried), eggplant limit sugar and processed foods (cakes, cookies, ice cream, crackers and chips) Increase fresh fruit but limit serving sizes 1/2 cup or about the size of tennis or baseball limit red meat to no more than 1-2 times per week (serving size about the size of your palm) Choose whole grains / lean proteins - whole wheat bread, quinoa, whole grain rice (1/2 cup), fish, chicken, [place]     . Increase physical activity     Try to do chair exercises daily - refer to handout given today       This is a list of the screening recommended for you and due dates:  Health Maintenance  Topic Date Due  . Eye exam for diabetics  07/18/2015  . DEXA scan (bone density measurement)  02/07/2016  . Hemoglobin A1C  04/05/2018  . Complete foot exam   10/06/2018  . Tetanus Vaccine  10/29/2018  . Flu Shot  Completed  . Pneumonia vaccines  Completed

## 2017-10-27 ENCOUNTER — Ambulatory Visit (INDEPENDENT_AMBULATORY_CARE_PROVIDER_SITE_OTHER): Payer: Medicare Other

## 2017-10-27 ENCOUNTER — Other Ambulatory Visit (INDEPENDENT_AMBULATORY_CARE_PROVIDER_SITE_OTHER): Payer: Medicare Other

## 2017-10-27 ENCOUNTER — Other Ambulatory Visit: Payer: Self-pay | Admitting: Orthopedic Surgery

## 2017-10-27 DIAGNOSIS — Z96652 Presence of left artificial knee joint: Secondary | ICD-10-CM | POA: Diagnosis not present

## 2017-10-27 DIAGNOSIS — M25562 Pain in left knee: Secondary | ICD-10-CM | POA: Diagnosis not present

## 2017-10-27 DIAGNOSIS — R52 Pain, unspecified: Secondary | ICD-10-CM

## 2017-10-27 DIAGNOSIS — Z471 Aftercare following joint replacement surgery: Secondary | ICD-10-CM | POA: Diagnosis not present

## 2017-10-31 DIAGNOSIS — E119 Type 2 diabetes mellitus without complications: Secondary | ICD-10-CM | POA: Diagnosis not present

## 2017-10-31 DIAGNOSIS — H3540 Unspecified peripheral retinal degeneration: Secondary | ICD-10-CM | POA: Diagnosis not present

## 2017-10-31 DIAGNOSIS — H524 Presbyopia: Secondary | ICD-10-CM | POA: Diagnosis not present

## 2017-10-31 DIAGNOSIS — H52223 Regular astigmatism, bilateral: Secondary | ICD-10-CM | POA: Diagnosis not present

## 2017-10-31 DIAGNOSIS — H5203 Hypermetropia, bilateral: Secondary | ICD-10-CM | POA: Diagnosis not present

## 2017-10-31 DIAGNOSIS — H25093 Other age-related incipient cataract, bilateral: Secondary | ICD-10-CM | POA: Diagnosis not present

## 2017-10-31 LAB — HM DIABETES EYE EXAM

## 2017-11-02 NOTE — Progress Notes (Signed)
Subjective:   Debra Campbell is a 83 y.o. female who presents for a subsequent Medicare Annual Wellness Visit. [name] lives at home alone. 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 She has 2 adult children and 3 grandchildren.   Review of Systems    Health is about the same as last year but mobility has decreased.   Cardiac Risk Factors include: advanced age (>56men, >40 women);hypertension;dyslipidemia;sedentary lifestyle;obesity (BMI >30kg/m2)  Musculoskeletal: L knee pain . Had surgery in 11/2016.     Objective:    Today's Vitals   10/26/17 1045  BP: 125/75  Pulse: 89  Weight: 222 lb (100.7 kg)  Height: 5\' 3"  (1.6 m)   Body mass index is 39.33 kg/m.  Advanced Directives 10/26/2017 04/15/2017 01/24/2017 01/03/2017 12/22/2016 12/17/2016 06/06/2015  Does Patient Have a Medical Advance Directive? No Yes No No Yes;No Yes Yes  Type of Advance Directive - [place];Living will - - - - Press photographer;Living will  Does patient want to make changes to medical advance directive? - - - - No - Patient declined No - Patient declined No - Patient declined  Copy of [place] in Chart? - No - copy requested - - - - No - copy requested  Would patient like information on creating a medical advance directive? No - Patient declined - - - No - Patient declined No - Patient declined -  Pre-existing out of facility DNR order (yellow form or pink MOST form) - - - - - - -    Current Medications (verified) Outpatient Encounter Medications as of 10/26/2017  Medication Sig  . acetaminophen (TYLENOL) 500 MG tablet Take 500 mg by mouth every 6 (six) hours as needed.  [name] azithromycin (ZITHROMAX Z-PAK) 250 MG tablet Take 2 tablets day one then 1 tablet daily until finished  . Elastic Bandages & Supports (V-2 HIGH COMPRESSION HOSE) MISC Wear daily when up on feet  . furosemide (LASIX) 40 MG tablet Take 1 tablet (40 mg total) 2 (two) times daily by  mouth. (Patient taking differently: Take 40 mg by mouth 2 (two) times daily. Take 1 tablet in the morning and 1/2 tablet in the evening)  . gabapentin (NEURONTIN) 300 MG capsule TAKE 1 CAPSULE AT BEDTIME  . glucose blood (ACCU-CHEK AVIVA PLUS) test strip Test 1x per day and prn. DX.E11.9  . guaiFENesin-codeine (CHERATUSSIN AC) 100-10 MG/5ML syrup Take 5 mLs 3 (three) times daily as needed by mouth for cough.  . levothyroxine (SYNTHROID, LEVOTHROID) 175 MCG tablet Take 1 tablet (175 mcg total) daily before breakfast by mouth.  . losartan (COZAAR) 50 MG tablet TAKE 1 TABLET (50 MG TOTAL) BY MOUTH AT BEDTIME.  . metFORMIN (GLUCOPHAGE) 1000 MG tablet TAKE 1 TABLET TWICE DAILY WITH A MEAL (Patient taking differently: 500 mg 2 (two) times daily with a meal. TAKE 1 TABLET TWICE DAILY WITH A MEAL)  . methocarbamol (ROBAXIN) 500 MG tablet Take 1 tablet (500 mg total) by mouth every 6 (six) hours as needed for muscle spasms.  . metoprolol succinate (TOPROL-XL) 25 MG 24 hr tablet TAKE 1 TABLET EVERY DAY  . rivaroxaban (XARELTO) 20 MG TABS tablet Take 1 tablet (20 mg total) by mouth daily with breakfast.  . triamcinolone cream (KENALOG) 0.5 % Apply 1 application topically 3 (three) times daily as needed.  . TRUEPLUS LANCETS 30G MISC CHECK BLOOD SUGAR ONCE A DAY AND AS NEEDED  . [DISCONTINUED] furosemide (LASIX) 40 MG tablet  TAKE 1 TABLET EVERY DAY   No facility-administered encounter medications on file as of 10/26/2017.     Allergies (verified) Ace inhibitors; Iohexol; Statins; Iodine; Penicillins; and Sulfa antibiotics   History: Past Medical History:  Diagnosis Date  . Aortic calcification ([place]) 2013   Atherosclerotic calcifications of the abdominal aorta and branch noted on CT Abd/Pelvis  . Arthritis    legs, lower back, hands  . Cancer ([place])    endometrial  . Diabetes mellitus   . Ectopic pregnancy   . Eczema   . Headache(784.0)    hx of  . History of kidney stones   . Hyperlipidemia     no meds  . Hypertension   . Hypothyroidism   . Persistent atrial fibrillation ([place]) 09/28/2016   Relatively new Dx: Asymptomatic.  Rate controlled without medication.  . SVD (spontaneous vaginal delivery)    x 3  . Thyroid disease    Past Surgical History:  Procedure Laterality Date  . ABDOMINAL HYSTERECTOMY Bilateral 06/26/2013   Procedure: TOTAL ABDOMINAL HYSTERECTOMY WITH BILATERAL SALPINGO OOPHERECTOMY WITH PELVIC LYMPHADNECTOMY;  Surgeon: Alvino Chapel, MD;  Location: [facility];  Service: Gynecology;  Laterality: Bilateral;  . BACK SURGERY  06/26/2013  . ECTOPIC PREGNANCY SURGERY    . HERNIA REPAIR    . HYSTEROSCOPY W/D&C N/A 04/26/2013   Procedure: DILATATION AND CURETTAGE /HYSTEROSCOPY;  Surgeon: Woodroe Mode, MD;  Location: [facility];  Service: Gynecology;  Laterality: N/A;  . JOINT REPLACEMENT    . LAPAROTOMY     for ectopic pregnancy  . LAPAROTOMY N/A 06/26/2013   Procedure: EXPLORATORY LAPAROTOMY;  Surgeon: Alvino Chapel, MD;  Location: [facility];  Service: Gynecology;  Laterality: N/A;  . lt knee arthroscopy    . rt total hip replacement    . TONSILLECTOMY    . TOTAL KNEE ARTHROPLASTY Left 12/22/2016   Procedure: LEFT TOTAL KNEE ARTHROPLASTY;  Surgeon: Latanya Maudlin, MD;  Location: [facility];  Service: Orthopedics;  Laterality: Left;  . WISDOM TOOTH EXTRACTION     Family History  Problem Relation Age of Onset  . Heart disease Father   . Thyroid disease Father   . Heart attack Father   . Heart disease Sister        open heart surgery  . Cancer Sister        breast  . Neuropathy Sister        secondary to cancer treatment  . Breast cancer Sister   . Suicidality Brother 33  . Arthritis Mother   . Cancer Daughter   . Breast cancer Daughter    Social History   Socioeconomic History  . Marital status: Married    Spouse name: Not on file  . Number of children: 3  . Years of education: Not on file  . Highest education level: Not on file  Social Needs    . Financial resource strain: Not hard at all  . Food insecurity - worry: Never true  . Food insecurity - inability: Never true  . Transportation needs - medical: No  . Transportation needs - non-medical: No  Occupational History  . Occupation: Retired    Comment: [employer]  Tobacco Use  . Smoking status: Never Smoker  . Smokeless tobacco: Never Used  Substance and Sexual Activity  . Alcohol use: No  . Drug use: No  . Sexual activity: No    Birth control/protection: Post-menopausal  Other Topics Concern  . Not on file  Social History Narrative  .  Not on file   Activities of Daily Living In your present state of health, do you have any difficulty performing the following activities: 10/26/2017 12/22/2016  Hearing? N N  Vision? N N  Comment Due for eye exam. Scheduled for Dec 2018. -  Difficulty concentrating or making decisions? Y N  Comment Has had some decline in short term memory -  Walking or climbing stairs? Y Y  Comment s/p left knee replacement and hip replacement. Continues to have left leg pain and walks with a cane -  Dressing or bathing? N N  Doing errands, shopping? N Y  Conservation officer, nature and eating ? N -  Using the Toilet? N -  In the past six months, have you accidently leaked urine? Y -  Comment urge incontinence -  Do you have problems with loss of bowel control? N -  Managing your Medications? N -  Managing your Finances? N -  Housekeeping or managing your Housekeeping? Y -  Comment Has difficulty due to leg pain ad mobility -  Some recent data might be hidden     Immunizations and Health Maintenance Immunization History  Administered Date(s) Administered  . Influenza, High Dose Seasonal PF 09/28/2016, 10/06/2017  . Influenza,inj,Quad PF,6+ Mos 09/26/2013, 09/09/2014, 10/13/2015  . Pneumococcal Conjugate-13 05/01/2015  . Pneumococcal Polysaccharide-23 03/29/2002  . Td 10/29/2008   Health Maintenance Due  Topic Date Due  . OPHTHALMOLOGY EXAM   07/18/2015  . DEXA SCAN  02/07/2016    Patient Care Team: Chevis Pretty, [place] as PCP - General (Nurse Practitioner) Marti Sleigh, MD as Attending Physician (Gynecology) Latanya Maudlin, MD as Consulting Physician (Orthopedic Surgery)  Venetia Night, OD-optometry   Left knee replacement 12/22/16. No other hospitalizations.      Assessment:   This is a routine wellness examination for Nelma.   Hearing/Vision screen No deficits noted during visit. Eye exam is up to date.   Dietary issues and exercise activities discussed: Current Exercise Habits: The patient does not participate in regular exercise at present, Exercise limited by: orthopedic condition(s)  Goals    . HEMOGLOBIN A1C < 7.0      Increase non-starchy vegetables - carrots, green bean, squash, zucchini, tomatoes, onions, peppers, spinach and other green leafy vegetables, cabbage, lettuce, cucumbers, asparagus, okra (not fried), eggplant limit sugar and processed foods (cakes, cookies, ice cream, crackers and chips) Increase fresh fruit but limit serving sizes 1/2 cup or about the size of tennis or baseball limit red meat to no more than 1-2 times per week (serving size about the size of your palm) Choose whole grains / lean proteins - whole wheat bread, quinoa, whole grain rice (1/2 cup), fish, chicken, [place]     . Increase physical activity     Try to do chair exercises daily - refer to handout given today      Depression Screen PHQ 2/9 Scores 10/26/2017 10/12/2017 10/06/2017 07/07/2017 07/04/2017 03/29/2017 02/23/2017  PHQ - 2 Score 0 0 0 0 0 0 0    Fall Risk Fall Risk  10/26/2017 10/12/2017 10/06/2017 07/07/2017 07/04/2017  Falls in the past year? No No No No No  Comment uses cane - - - -  Risk for fall due to : Impaired mobility - - - -   Cognitive Function: MMSE - Mini Mental State Exam 10/26/2017 06/06/2015  Orientation to time 5 5  Orientation to Place 5 5  Registration 3 3  Attention/ Calculation 5  5  Recall 3 3  Language- name  2 objects 2 2  Language- repeat 1 1  Language- follow 3 step command 3 3  Language- read & follow direction 1 1  Write a sentence 1 1  Copy design 1 1  Total score 30 30        Screening Tests Health Maintenance  Topic Date Due  . OPHTHALMOLOGY EXAM  07/18/2015  . DEXA SCAN  02/07/2016  . HEMOGLOBIN A1C  04/05/2018  . FOOT EXAM  10/06/2018  . TETANUS/TDAP  10/29/2018  . INFLUENZA VACCINE  Completed  . PNA vac Low Risk Adult  Completed       Plan:  Move carefully to avoid falls. Chair exercises given and discussed.  Keep f/u with PCP Schedule eye exam  I have personally reviewed and noted the following in the patient's chart:   . Medical and social history . Use of alcohol, tobacco or illicit drugs  . Current medications and supplements . Functional ability and status . Nutritional status . Physical activity . Advanced directives . List of other physicians . Hospitalizations, surgeries, and ER visits in previous 12 months . Vitals . Screenings to include cognitive, depression, and falls . Referrals and appointments  In addition, I have reviewed and discussed with patient certain preventive protocols, quality metrics, and best practice recommendations. A written personalized care plan for preventive services as well as general preventive health recommendations were provided to patient.     Chong Sicilian, RN   10/26/2017   I have reviewed and agree with the above AWV documentation.   [name] Hassell Done, FNP

## 2017-11-05 ENCOUNTER — Other Ambulatory Visit: Payer: Self-pay | Admitting: Nurse Practitioner

## 2017-11-05 DIAGNOSIS — I10 Essential (primary) hypertension: Secondary | ICD-10-CM

## 2017-12-05 ENCOUNTER — Telehealth: Payer: Self-pay | Admitting: *Deleted

## 2017-12-05 ENCOUNTER — Encounter: Payer: Self-pay | Admitting: Nurse Practitioner

## 2017-12-05 DIAGNOSIS — Z1231 Encounter for screening mammogram for malignant neoplasm of breast: Secondary | ICD-10-CM | POA: Diagnosis not present

## 2017-12-05 NOTE — Telephone Encounter (Signed)
Called and left a message for the patient to call the office back. Need to move appt from May 24th to May 14th. MD will be out of the office

## 2017-12-05 NOTE — Telephone Encounter (Signed)
Patient called back and was given the new date/time for the May 14th appt.

## 2017-12-08 NOTE — Addendum Note (Signed)
Addended by: Ilean China on: 12/08/2017 01:12 PM   Modules accepted: Level of Service

## 2018-01-13 ENCOUNTER — Encounter: Payer: Self-pay | Admitting: Nurse Practitioner

## 2018-01-13 ENCOUNTER — Ambulatory Visit (INDEPENDENT_AMBULATORY_CARE_PROVIDER_SITE_OTHER): Payer: Medicare Other | Admitting: Nurse Practitioner

## 2018-01-13 VITALS — BP 127/79 | HR 87 | Temp 96.6°F | Ht 63.0 in | Wt 228.0 lb

## 2018-01-13 DIAGNOSIS — M8588 Other specified disorders of bone density and structure, other site: Secondary | ICD-10-CM

## 2018-01-13 DIAGNOSIS — I10 Essential (primary) hypertension: Secondary | ICD-10-CM | POA: Diagnosis not present

## 2018-01-13 DIAGNOSIS — M150 Primary generalized (osteo)arthritis: Secondary | ICD-10-CM

## 2018-01-13 DIAGNOSIS — E7849 Other hyperlipidemia: Secondary | ICD-10-CM

## 2018-01-13 DIAGNOSIS — M5432 Sciatica, left side: Secondary | ICD-10-CM | POA: Diagnosis not present

## 2018-01-13 DIAGNOSIS — I481 Persistent atrial fibrillation: Secondary | ICD-10-CM

## 2018-01-13 DIAGNOSIS — I700 Atherosclerosis of aorta: Secondary | ICD-10-CM

## 2018-01-13 DIAGNOSIS — I4819 Other persistent atrial fibrillation: Secondary | ICD-10-CM

## 2018-01-13 DIAGNOSIS — E034 Atrophy of thyroid (acquired): Secondary | ICD-10-CM | POA: Diagnosis not present

## 2018-01-13 DIAGNOSIS — R609 Edema, unspecified: Secondary | ICD-10-CM

## 2018-01-13 DIAGNOSIS — C541 Malignant neoplasm of endometrium: Secondary | ICD-10-CM | POA: Diagnosis not present

## 2018-01-13 DIAGNOSIS — M159 Polyosteoarthritis, unspecified: Secondary | ICD-10-CM

## 2018-01-13 DIAGNOSIS — E1142 Type 2 diabetes mellitus with diabetic polyneuropathy: Secondary | ICD-10-CM | POA: Diagnosis not present

## 2018-01-13 LAB — CMP14+EGFR
ALBUMIN: 4.2 g/dL (ref 3.5–4.7)
ALT: 14 IU/L (ref 0–32)
AST: 21 IU/L (ref 0–40)
Albumin/Globulin Ratio: 1.8 (ref 1.2–2.2)
Alkaline Phosphatase: 46 IU/L (ref 39–117)
BILIRUBIN TOTAL: 0.3 mg/dL (ref 0.0–1.2)
BUN / CREAT RATIO: 14 (ref 12–28)
BUN: 13 mg/dL (ref 8–27)
CO2: 24 mmol/L (ref 20–29)
CREATININE: 0.96 mg/dL (ref 0.57–1.00)
Calcium: 9.4 mg/dL (ref 8.7–10.3)
Chloride: 102 mmol/L (ref 96–106)
GFR calc non Af Amer: 55 mL/min/{1.73_m2} — ABNORMAL LOW (ref >59)
GFR, EST AFRICAN AMERICAN: 63 mL/min/{1.73_m2} (ref >59)
GLUCOSE: 147 mg/dL — AB (ref 65–99)
Globulin, Total: 2.4 g/dL (ref 1.5–4.5)
Potassium: 4.5 mmol/L (ref 3.5–5.2)
Sodium: 141 mmol/L (ref 134–144)
TOTAL PROTEIN: 6.6 g/dL (ref 6.0–8.5)

## 2018-01-13 LAB — LIPID PANEL
CHOL/HDL RATIO: 2.5 ratio (ref 0.0–4.4)
Cholesterol, Total: 165 mg/dL (ref 100–199)
HDL: 67 mg/dL (ref >39)
LDL CALC: 80 mg/dL (ref 0–99)
Triglycerides: 90 mg/dL (ref 0–149)
VLDL CHOLESTEROL CAL: 18 mg/dL (ref 5–40)

## 2018-01-13 LAB — BAYER DCA HB A1C WAIVED: HB A1C (BAYER DCA - WAIVED): 6.8 % (ref <7.0)

## 2018-01-13 MED ORDER — GABAPENTIN 300 MG PO CAPS
300.0000 mg | ORAL_CAPSULE | Freq: Two times a day (BID) | ORAL | 1 refills | Status: DC
Start: 2018-01-13 — End: 2018-04-13

## 2018-01-13 MED ORDER — METFORMIN HCL 1000 MG PO TABS: ORAL_TABLET | ORAL | 1 refills | Status: DC

## 2018-01-13 MED ORDER — METOPROLOL SUCCINATE ER 25 MG PO TB24: 25.0000 mg | ORAL_TABLET | Freq: Every day | ORAL | 1 refills | Status: DC

## 2018-01-13 MED ORDER — LOSARTAN POTASSIUM 50 MG PO TABS: 50.0000 mg | ORAL_TABLET | Freq: Every day | ORAL | 1 refills | Status: DC

## 2018-01-13 MED ORDER — FUROSEMIDE 40 MG PO TABS: 40.0000 mg | ORAL_TABLET | Freq: Two times a day (BID) | ORAL | 1 refills | Status: DC

## 2018-01-13 MED ORDER — LEVOTHYROXINE SODIUM 175 MCG PO TABS: 175.0000 ug | ORAL_TABLET | Freq: Every day | ORAL | 1 refills | Status: DC

## 2018-01-13 NOTE — Patient Instructions (Signed)

## 2018-01-13 NOTE — Progress Notes (Signed)
Subjective:    Patient ID: Debra Campbell, female    DOB: 1932-06-27, 84 y.o.   MRN: [id number]  HPI  Debra Campbell is here today for follow up of chronic medical problem.  Outpatient Encounter Medications as of 01/13/2018  Medication Sig  . acetaminophen (TYLENOL) 500 MG tablet Take 500 mg by mouth every 6 (six) hours as needed.  [name] azithromycin (ZITHROMAX Z-PAK) 250 MG tablet Take 2 tablets day one then 1 tablet daily until finished  . Elastic Bandages & Supports (V-2 HIGH COMPRESSION HOSE) MISC Wear daily when up on feet  . furosemide (LASIX) 40 MG tablet Take 1 tablet (40 mg total) 2 (two) times daily by mouth. (Patient taking differently: Take 40 mg by mouth 2 (two) times daily. Take 1 tablet in the morning and 1/2 tablet in the evening)  . gabapentin (NEURONTIN) 300 MG capsule TAKE 1 CAPSULE AT BEDTIME  . glucose blood (ACCU-CHEK AVIVA PLUS) test strip Test 1x per day and prn. DX.E11.9  . guaiFENesin-codeine (CHERATUSSIN AC) 100-10 MG/5ML syrup Take 5 mLs 3 (three) times daily as needed by mouth for cough.  . levothyroxine (SYNTHROID, LEVOTHROID) 175 MCG tablet Take 1 tablet (175 mcg total) daily before breakfast by mouth.  . losartan (COZAAR) 50 MG tablet TAKE 1 TABLET (50 MG TOTAL) BY MOUTH AT BEDTIME.  . metFORMIN (GLUCOPHAGE) 1000 MG tablet TAKE 1 TABLET TWICE DAILY WITH A MEAL (Patient taking differently: 500 mg 2 (two) times daily with a meal. TAKE 1 TABLET TWICE DAILY WITH A MEAL)  . methocarbamol (ROBAXIN) 500 MG tablet Take 1 tablet (500 mg total) by mouth every 6 (six) hours as needed for muscle spasms.  . metoprolol succinate (TOPROL-XL) 25 MG 24 hr tablet TAKE 1 TABLET EVERY DAY  . rivaroxaban (XARELTO) 20 MG TABS tablet Take 1 tablet (20 mg total) by mouth daily with breakfast.  . triamcinolone cream (KENALOG) 0.5 % Apply 1 application topically 3 (three) times daily as needed.  . TRUEPLUS LANCETS 30G MISC CHECK BLOOD SUGAR ONCE A DAY AND AS NEEDED     1. Aortic  calcification (HCC)  Diagnosis made by cardiology years ago-has had several chest xrays in which was not mentioned. We will continue to monitor. She saw cardiology 09/23/17.  2. Essential hypertension  No c/o chest pain, sob or headache. Does not check blood pressures at home.  3. Persistent atrial fibrillation ([place])  Sometimes she can feel palpitations but mostly doing well. She is on xeralto and denies any bleeding issues  4. Diabetic polyneuropathy associated with type 2 diabetes mellitus ([place])  Continues to have numbness and burning in her feet bil. Gabapentin helps.  5. Hypothyroidism due to acquired atrophy of thyroid  No issues she is awareof  6. Type 2 diabetes mellitus with diabetic polyneuropathy, without long-term current use of insulin (HCC) last HGBA1C was 6.4%. She does not check blood sugars daily. No symptoms  Of hypoglycemia.  . Other hyperlipidemia  doe not watch diet and does no exercise  8. Morbid obesity ([place])  Actual BMI is 39.3 and having diabetes classifies this as morbid obesity  9. Primary osteoarthritis involving multiple joints  Has already had to have left knee replacement. Both of her hands hurt al the time from arthritis  10. Osteopenia of spine  Has constant back pain with sciatica. She would like to increase her gaapentin to see if will help. She has seen dr. Rosamaria Lints for this and her gave her an antiinflammatory but did  not help. She cannot remember name of medicicne. Last dexascan was 02/06/14. We usually do not repeat after age 50  11. Endometrial cancer ([place])  Was in 2014. Has been clinically free of disease.    New complaints: None today  Social history: Husband is still living and they get around well.    Review of Systems  Constitutional: Negative for activity change and appetite change.  HENT: Negative.   Eyes: Negative for pain.  Respiratory: Negative for shortness of breath.   Cardiovascular: Negative for chest pain, palpitations and leg  swelling.  Gastrointestinal: Negative for abdominal pain.  Endocrine: Negative for polydipsia.  Genitourinary: Negative.   Skin: Negative for rash.  Neurological: Negative for dizziness, weakness and headaches.  Hematological: Does not bruise/bleed easily.  Psychiatric/Behavioral: Negative.   All other systems reviewed and are negative.      Objective:   Physical Exam  Constitutional: She is oriented to person, place, and time. She appears well-developed and well-nourished.  HENT:  Nose: Nose normal.  Mouth/Throat: Oropharynx is clear and moist.  Eyes: EOM are normal.  Neck: Trachea normal, normal range of motion and full passive range of motion without pain. Neck supple. No JVD present. Carotid bruit is not present. No thyromegaly present.  Cardiovascular: Normal rate, regular rhythm, normal heart sounds and intact distal pulses. Exam reveals no gallop and no friction rub.  No murmur heard. Pulmonary/Chest: Effort normal and breath sounds normal.  Abdominal: Soft. Bowel sounds are normal. She exhibits no distension and no mass. There is no tenderness.  Musculoskeletal: Normal range of motion. Edema:  edema bil lower ext.  Lymphadenopathy:    She has no cervical adenopathy.  Neurological: She is alert and oriented to person, place, and time. She has normal reflexes.  Skin: Skin is warm and dry.  Psychiatric: She has a normal mood and affect. Her behavior is normal. Judgment and thought content normal.    BP 127/79   Pulse 87   Temp (!) 96.6 F (35.9 C) (Oral)   Ht 5' 3"  (1.6 m)   Wt 228 lb (103.4 kg)   BMI 40.39 kg/m   hgba1c 6.8% today     Assessment & Plan:  1. Aortic calcification (HCC) Will continue to watch  2. Essential hypertension Low sodium diet - CMP14+EGFR - losartan (COZAAR) 50 MG tablet; Take 1 tablet (50 mg total) by mouth daily.  Dispense: 90 tablet; Refill: 1 - metoprolol succinate (TOPROL-XL) 25 MG 24 hr tablet; Take 1 tablet (25 mg total) by mouth  daily.  Dispense: 90 tablet; Refill: 1  3. Persistent atrial fibrillation (HCC) Continue xeralto- is going to talk with cardiology about stopping it due to expense- I will let him make that call.  4. Diabetic polyneuropathy associated with type 2 diabetes mellitus ([place]) Do not go barefooted  5. Hypothyroidism due to acquired atrophy of thyroid - levothyroxine (SYNTHROID, LEVOTHROID) 175 MCG tablet; Take 1 tablet (175 mcg total) by mouth daily before breakfast.  Dispense: 90 tablet; Refill: 1  6. Type 2 diabetes mellitus with diabetic polyneuropathy, without long-term current use of insulin (HCC) Continue to watch carbs in diet - Bayer DCA Hb A1c Waived - Microalbumin / creatinine urine ratio - metFORMIN (GLUCOPHAGE) 1000 MG tablet; TAKE 1 TABLET TWICE DAILY WITH A MEAL  Dispense: 180 tablet; Refill: 1  7. Other hyperlipidemia Low fat diet - Lipid panel  8. Morbid obesity ([place]) Discussed diet and exercise for person with BMI >25 Will recheck weight in 3-6 months  9. Primary osteoarthritis involving multiple joints Keep follow up appointment with DR. Geoffrey  10. Osteopenia of spine Cannot tolerate weight bearing erercises  11. Endometrial cancer ([place])  12. Peripheral edema elevate legs when sitting - furosemide (LASIX) 40 MG tablet; Take 1 tablet (40 mg total) by mouth 2 (two) times daily.  Dispense: 180 tablet; Refill: 1  13. Sciatica of left side Increase gabapentin dose from 1 qhs to bid - gabapentin (NEURONTIN) 300 MG capsule; Take 1 capsule (300 mg total) by mouth 2 (two) times daily.  Dispense: 180 capsule; Refill: 1    Labs pending Health maintenance reviewed Diet and exercise encouraged Continue all meds Follow up  In 3 months   [place], FNP

## 2018-01-14 LAB — MICROALBUMIN / CREATININE URINE RATIO
Creatinine, Urine: 122.2 mg/dL
MICROALBUM., U, RANDOM: 7.9 ug/mL
Microalb/Creat Ratio: 6.5 mg/g creat (ref 0.0–30.0)

## 2018-01-17 ENCOUNTER — Encounter: Payer: Self-pay | Admitting: Cardiology

## 2018-01-17 ENCOUNTER — Ambulatory Visit (INDEPENDENT_AMBULATORY_CARE_PROVIDER_SITE_OTHER): Payer: Medicare Other | Admitting: Cardiology

## 2018-01-17 VITALS — BP 115/70 | HR 89 | Ht 63.0 in | Wt 225.6 lb

## 2018-01-17 DIAGNOSIS — E782 Mixed hyperlipidemia: Secondary | ICD-10-CM

## 2018-01-17 DIAGNOSIS — I4819 Other persistent atrial fibrillation: Secondary | ICD-10-CM

## 2018-01-17 DIAGNOSIS — M1612 Unilateral primary osteoarthritis, left hip: Secondary | ICD-10-CM | POA: Diagnosis not present

## 2018-01-17 DIAGNOSIS — I481 Persistent atrial fibrillation: Secondary | ICD-10-CM

## 2018-01-17 DIAGNOSIS — I700 Atherosclerosis of aorta: Secondary | ICD-10-CM | POA: Diagnosis not present

## 2018-01-17 DIAGNOSIS — M7062 Trochanteric bursitis, left hip: Secondary | ICD-10-CM | POA: Diagnosis not present

## 2018-01-17 DIAGNOSIS — M5136 Other intervertebral disc degeneration, lumbar region: Secondary | ICD-10-CM | POA: Diagnosis not present

## 2018-01-17 DIAGNOSIS — M4186 Other forms of scoliosis, lumbar region: Secondary | ICD-10-CM | POA: Diagnosis not present

## 2018-01-17 DIAGNOSIS — I10 Essential (primary) hypertension: Secondary | ICD-10-CM | POA: Diagnosis not present

## 2018-01-17 NOTE — Patient Instructions (Signed)
No change with current medication     Your physician wants you to follow-up in 12 months with dr harding.You will receive a reminder letter in the mail two months in advance. If you don't receive a letter, please call our office to schedule the follow-up appointment.    If you need a refill on your cardiac medications before your next appointment, please call your pharmacy.

## 2018-01-17 NOTE — Progress Notes (Signed)
PCP: Chevis Pretty, FNP  Clinic Note: Chief Complaint  Patient presents with  . Follow-up    No major complaints  . Atrial Fibrillation    No symptoms.  On Xarelto now with no bleeding    HPI: Debra Campbell is a 84 y.o. female with a PMH below who presents today for routine 6 month follow-up for A. fib diagnosed back in November 2017  CHA2DS2-VASc Score and unadjusted Ischemic Stroke Rate (% per year) is equal to 7.2 % stroke rate/year from a score of 5 (HTN, DM, Female, Age (2)).   01/11/2017 saw Debra Pho, PA --> was supposed to start a DOAC, but did not happen.  Debra Campbell was last seen on 07/19/2017. She is relatively asymptomatic and no sensation of A. fib. We spent some time discussing anticoagulation, and then she was seen by Debra Campbell, RPH in [facility] ([facility]) -finally, she started Xarelto.  Recent Hospitalizations:   None  Studies Personally Reviewed - (if available, images/films reviewed: From Epic Chart or Care Everywhere)  None  Interval History: Debra Campbell presents today feeling fine.  She really denies any major cardiac issues.  She has no sensation of being in A. fib or not.  No rapid irregular heartbeat or palpitation sensation despite being in A. fib.  She is not very active, but does note that may be when she exerts herself her heart rate will go up a little bit.  She basically says he feels hot and tired more quickly with activity than she used to.   he denies any lightheadedness or dizziness, wooziness or syncope/near syncope, TIA/amaurosis fugax.  She still sleeps in a chair mostly because of GERD and shoulder pain, no real obvious orthopnea and PND.  Chronic lower extremity edema. No chest tightness or pressure with rest or exertion.  No claudication.   ROS: A comprehensive was performed. Review of Systems  Constitutional: Negative for malaise/fatigue.  HENT: Negative for congestion.   Respiratory:  Negative for cough, shortness of breath and wheezing.   Gastrointestinal: Positive for heartburn. Negative for abdominal pain, nausea and vomiting.  Genitourinary: Negative for frequency and hematuria.  Musculoskeletal: Positive for back pain and joint pain (Shoulder pain).  Psychiatric/Behavioral: Positive for memory loss (She seems to be having some, but does not admit to it).  All other systems reviewed and are negative.  I have reviewed and (if needed) personally updated the patient's problem list, medications, allergies, past medical and surgical history, social and family history.   Past Medical History:  Diagnosis Date  . Aortic calcification ([place]) 2013   Atherosclerotic calcifications of the abdominal aorta and branch noted on CT Abd/Pelvis  . Arthritis    legs, lower back, hands  . Cancer ([place])    endometrial  . Diabetes mellitus   . Ectopic pregnancy   . Eczema   . Headache(784.0)    hx of  . History of kidney stones   . Hyperlipidemia    no meds  . Hypertension   . Hypothyroidism   . Persistent atrial fibrillation ([place]) 09/28/2016   Relatively new Dx: Asymptomatic.  Rate controlled without medication.  . SVD (spontaneous vaginal delivery)    x 3  . Thyroid disease     Past Surgical History:  Procedure Laterality Date  . ABDOMINAL HYSTERECTOMY Bilateral 06/26/2013   Procedure: TOTAL ABDOMINAL HYSTERECTOMY WITH BILATERAL SALPINGO OOPHERECTOMY WITH PELVIC LYMPHADNECTOMY;  Surgeon: Alvino Chapel, MD;  Location: [facility];  Service: Gynecology;  Laterality: Bilateral;  . BACK SURGERY  06/26/2013  . ECTOPIC PREGNANCY SURGERY    . HERNIA REPAIR    . HYSTEROSCOPY W/D&C N/A 04/26/2013   Procedure: DILATATION AND CURETTAGE /HYSTEROSCOPY;  Surgeon: Woodroe Mode, MD;  Location: [facility];  Service: Gynecology;  Laterality: N/A;  . JOINT REPLACEMENT    . LAPAROTOMY     for ectopic pregnancy  . LAPAROTOMY N/A 06/26/2013   Procedure: EXPLORATORY LAPAROTOMY;  Surgeon:  Alvino Chapel, MD;  Location: [facility];  Service: Gynecology;  Laterality: N/A;  . lt knee arthroscopy    . rt total hip replacement    . TONSILLECTOMY    . TOTAL KNEE ARTHROPLASTY Left 12/22/2016   Procedure: LEFT TOTAL KNEE ARTHROPLASTY;  Surgeon: Latanya Maudlin, MD;  Location: [facility];  Service: Orthopedics;  Laterality: Left;  . WISDOM TOOTH EXTRACTION      Current Meds  Medication Sig  . acetaminophen (TYLENOL) 500 MG tablet Take 500 mg by mouth every 6 (six) hours as needed.  . Elastic Bandages & Supports (V-2 HIGH COMPRESSION HOSE) MISC Wear daily when up on feet  . furosemide (LASIX) 40 MG tablet Take 1 tablet (40 mg total) by mouth 2 (two) times daily.  [name] gabapentin (NEURONTIN) 300 MG capsule Take 1 capsule (300 mg total) by mouth 2 (two) times daily.  [name] glucose blood (ACCU-CHEK AVIVA PLUS) test strip Test 1x per day and prn. DX.E11.9  . levothyroxine (SYNTHROID, LEVOTHROID) 175 MCG tablet Take 1 tablet (175 mcg total) by mouth daily before breakfast.  . losartan (COZAAR) 50 MG tablet Take 1 tablet (50 mg total) by mouth daily.  . metFORMIN (GLUCOPHAGE) 1000 MG tablet TAKE 1 TABLET TWICE DAILY WITH A MEAL  . methocarbamol (ROBAXIN) 500 MG tablet Take 1 tablet (500 mg total) by mouth every 6 (six) hours as needed for muscle spasms.  . rivaroxaban (XARELTO) 20 MG TABS tablet Take 1 tablet (20 mg total) by mouth daily with breakfast.  . triamcinolone cream (KENALOG) 0.5 % Apply 1 application topically 3 (three) times daily as needed.  . TRUEPLUS LANCETS 30G MISC CHECK BLOOD SUGAR ONCE A DAY AND AS NEEDED  . [DISCONTINUED] metoprolol succinate (TOPROL-XL) 25 MG 24 hr tablet Take 1 tablet (25 mg total) by mouth daily.    Allergies  Allergen Reactions  . Ace Inhibitors Cough  . Iohexol      Desc: HIVES 40 YEARS AGO   . Statins     myalgia  . Iodine Rash  . Penicillins Swelling and Rash    Has patient had a PCN reaction causing immediate rash, facial/tongue/throat  swelling, SOB or lightheadedness with hypotension: Yes Has patient had a PCN reaction causing severe rash involving mucus membranes or skin necrosis: No Has patient had a PCN reaction that required hospitalization Yes Has patient had a PCN reaction occurring within the last 10 years: No If all of the above answers are "NO", then may proceed with Cephalosporin use.   . Sulfa Antibiotics Rash    Social History   Socioeconomic History  . Marital status: Married    Spouse name: None  . Number of children: 3  . Years of education: None  . Highest education level: None  Social Needs  . Financial resource strain: Not hard at all  . Food insecurity - worry: Never true  . Food insecurity - inability: Never true  . Transportation needs - medical: No  . Transportation needs - non-medical: No  Occupational History  .  Occupation: Retired    Comment: [employer]  Tobacco Use  . Smoking status: Never Smoker  . Smokeless tobacco: Never Used  Substance and Sexual Activity  . Alcohol use: No  . Drug use: No  . Sexual activity: No    Birth control/protection: Post-menopausal  Other Topics Concern  . None  Social History Narrative  . None    family history includes Arthritis in her mother; Breast cancer in her daughter and sister; Cancer in her daughter and sister; Heart attack in her father; Heart disease in her father and sister; Neuropathy in her sister; Suicidality (age of onset: 58) in her brother; Thyroid disease in her father.  Wt Readings from Last 3 Encounters:  01/17/18 225 lb 9.6 oz (102.3 kg)  01/13/18 228 lb (103.4 kg)  10/26/17 222 lb (100.7 kg)    PHYSICAL EXAM BP 115/70   Pulse 89   Ht 5\' 3"  (1.6 m)   Wt 225 lb 9.6 oz (102.3 kg)   BMI 39.96 kg/m  Physical Exam  Constitutional: She is oriented to person, place, and time. She appears well-developed. No distress.  Obese  HENT:  Head: Normocephalic and atraumatic.  Hard of hearing  Neck: Normal range of motion. No  hepatojugular reflux and no JVD present. Carotid bruit is not present. No tracheal deviation present. No thyromegaly present.  Cardiovascular: Normal rate, normal heart sounds and normal pulses. An irregularly irregular rhythm present. PMI is not displaced. Exam reveals no gallop.  No murmur heard. Pulmonary/Chest: Effort normal and breath sounds normal. No respiratory distress. She has no wheezes. She has no rales.  Abdominal: Soft. Bowel sounds are normal. She exhibits no distension. There is no tenderness. There is no rebound.  Obese  Musculoskeletal: Normal range of motion. She exhibits edema (2+ bilateral lower extremity with mild stasis changes).  Left knee is still somewhat stiff with somewhat antalgic gait, but notably improved from when I saw her last year. (Now is postop)  Neurological: She is alert and oriented to person, place, and time.  Skin: Skin is warm and dry. No rash noted. No erythema.  Psychiatric: She has a normal mood and affect. Her behavior is normal.  Seems to be in a much more agreeable mood today.  Less argumentative.  Listen more and was more agreeable with recommendations.  Nursing note and vitals reviewed.    Adult ECG Report  Rate: 89;  Rhythm: atrial fibrillation and Otherwise normal axis, intervals and durations. Nonspecific ST and T-wave changes.;   Narrative Interpretation: Stable EKG   Other studies Reviewed: Additional studies/ records that were reviewed today include:  Recent Labs:   Lab Results  Component Value Date   CREATININE 0.96 01/13/2018   BUN 13 01/13/2018   NA 141 01/13/2018   K 4.5 01/13/2018   CL 102 01/13/2018   CO2 24 01/13/2018   Lab Results  Component Value Date   CHOL 165 01/13/2018   HDL 67 01/13/2018   [place] 80 01/13/2018   TRIG 90 01/13/2018   CHOLHDL 2.5 01/13/2018    ASSESSMENT / PLAN: Problem List Items Addressed This Visit    Aortic calcification (HCC) (Chronic)    Not unexpectedly she has aortic  calcification and no evidence of AAA or thoracic aortic aneurysm.  Simply continue our cardiovascular risk reduction with blood pressure and lipid management.      Essential hypertension (Chronic)    Excellent control with current dose of losartan and metoprolol.      Hyperlipidemia (Chronic)  LDL is pretty good with most recent lab check.  She is not on statin.  And given her age, I probably would not start one.      Morbid obesity (HCC) (Chronic)    Discussed importance of trying to pick up the level of exercise and cut back p.o. intake.      Persistent atrial fibrillation (HCC) - Primary (Chronic)    She remains relatively asymptomatic, rate controlled with low-dose beta-blocker.  She is now taking Xarelto with no bleeding issues.  Continue to follow along.      Relevant Orders   EKG 12-Lead (Completed)      Over 25 minutes was spent with the patient and her niece today. >r 75% of the time was spent in direct patient counseling. Discussing pathophysiology of atrial fibrillation as well as concern for stroke prevention.  She was much more receptive and understanding of our thought process.  Much better grasp of the reasons for taking the current medications.   Current medicines are reviewed at length with the patient today. (+/- concerns)  - concerns about taking a new medication The following changes have been made:None  Patient Instructions  No change with current medication     Your physician wants you to follow-up in 12 months with dr Jarren Para.You will receive a reminder letter in the mail two months in advance. If you don't receive a letter, please call our office to schedule the follow-up appointment.    If you need a refill on your cardiac medications before your next appointment, please call your pharmacy.    Studies Ordered:   Orders Placed This Encounter  Procedures  . EKG 12-Lead      Glenetta Hew, M.D., M.S. Interventional Cardiologist    Pager # [phone] Phone # [phone] [address] [place], [place] [address]

## 2018-01-18 ENCOUNTER — Telehealth: Payer: Self-pay

## 2018-01-18 ENCOUNTER — Other Ambulatory Visit: Payer: Self-pay | Admitting: Orthopedic Surgery

## 2018-01-18 DIAGNOSIS — M5136 Other intervertebral disc degeneration, lumbar region: Secondary | ICD-10-CM

## 2018-01-18 NOTE — Telephone Encounter (Signed)
Will route to Pharmacy as per request.

## 2018-01-18 NOTE — Telephone Encounter (Signed)
   [facility] Pre-operative Risk Assessment    Request for surgical clearance:  1. What type of surgery is being performed? Mylegram  2. When is this surgery scheduled? pending   3. What type of clearance is required (medical clearance vs. Pharmacy clearance to hold med vs. Both)? Pharmacy  4. Are there any medications that need to be held prior to surgery and how long?Xarelto  5. Practice name and name of physician performing surgery? [facility]  6. What is your office phone and fax number? Phone # [phone]  Fax # [phone]  7. Anesthesia type (None, local, MAC, general) ? unknown   Kathyrn Lass 01/18/2018, 2:44 PM  _________________________________________________________________   (provider comments below)

## 2018-01-18 NOTE — Telephone Encounter (Signed)
Patient with diagnosis of atrial fibrillation on Xarelto for anticoagulation.    Procedure: myleogram Date of procedure: tbd  CHADS2-VASc score of  5 (HTN, AGE x 2, DM2, female)  CrCl 71.7 Platelet count 130 (note this is from Jan 2018)  Per office protocol, patient can hold Xarelto for 3 days prior to procedure.   Patient will not need bridging with Lovenox (enoxaparin) around procedure.  Patient should restart Xarelto on the evening of procedure or day after, at discretion of procedure MD

## 2018-01-19 MED ORDER — METOPROLOL SUCCINATE ER 25 MG PO TB24: 25.0000 mg | ORAL_TABLET | Freq: Every day | ORAL | 1 refills | Status: DC

## 2018-01-19 NOTE — Addendum Note (Signed)
Addended by: Antonietta Barcelona D on: 01/19/2018 08:33 AM   Modules accepted: Orders

## 2018-01-20 NOTE — Telephone Encounter (Signed)
   Per pharmacy recs, ok to hold Xarelto 3 days prior to injection needed for Myelogram. No need for Lovenox Bridge. Pt should restart Xarelto on the evening of procedure or day after, at discretion of procedure MD.   I will route recs/ clearance to [facility] [employer] # listed below.   Lyda Jester, PA-C 01/20/2018

## 2018-01-23 ENCOUNTER — Encounter: Payer: Self-pay | Admitting: Cardiology

## 2018-01-23 NOTE — Assessment & Plan Note (Signed)
Not unexpectedly she has aortic calcification and no evidence of AAA or thoracic aortic aneurysm.  Simply continue our cardiovascular risk reduction with blood pressure and lipid management.

## 2018-01-23 NOTE — Assessment & Plan Note (Addendum)
LDL is pretty good with most recent lab check.  She is not on statin.  And given her age, I probably would not start one.

## 2018-01-23 NOTE — Assessment & Plan Note (Signed)
She remains relatively asymptomatic, rate controlled with low-dose beta-blocker.  She is now taking Xarelto with no bleeding issues.  Continue to follow along.

## 2018-01-23 NOTE — Assessment & Plan Note (Signed)
Discussed importance of trying to pick up the level of exercise and cut back p.o. intake.

## 2018-01-23 NOTE — Assessment & Plan Note (Signed)
Excellent control with current dose of losartan and metoprolol.

## 2018-02-06 ENCOUNTER — Ambulatory Visit
Admission: RE | Admit: 2018-02-06 | Discharge: 2018-02-06 | Disposition: A | Discharge status: Home / Self Care | Payer: Medicare Other | Source: Ambulatory Visit | Attending: Orthopedic Surgery | Admitting: Orthopedic Surgery

## 2018-02-06 DIAGNOSIS — M5136 Other intervertebral disc degeneration, lumbar region: Secondary | ICD-10-CM

## 2018-02-06 DIAGNOSIS — M48061 Spinal stenosis, lumbar region without neurogenic claudication: Secondary | ICD-10-CM | POA: Diagnosis not present

## 2018-02-06 MED ORDER — DIAZEPAM 5 MG PO TABS
5.0000 mg | ORAL_TABLET | Freq: Once | ORAL | Status: AC
  Administered 2018-02-06: 5 mg via ORAL

## 2018-02-06 MED ORDER — IOPAMIDOL (ISOVUE-M 200) INJECTION 41%
15.0000 mL | Freq: Once | INTRAMUSCULAR | Status: AC
  Administered 2018-02-06: 15 mL via INTRATHECAL

## 2018-02-06 MED ORDER — MEPERIDINE HCL 50 MG/ML IJ SOLN
50.0000 mg | Freq: Once | INTRAMUSCULAR | Status: AC
  Administered 2018-02-06: 50 mg via INTRAMUSCULAR

## 2018-02-06 MED ORDER — ONDANSETRON HCL 4 MG/2ML IJ SOLN
4.0000 mg | Freq: Once | INTRAMUSCULAR | Status: AC
  Administered 2018-02-06: 4 mg via INTRAMUSCULAR

## 2018-02-06 NOTE — Discharge Instructions (Signed)

## 2018-02-14 DIAGNOSIS — M5136 Other intervertebral disc degeneration, lumbar region: Secondary | ICD-10-CM | POA: Diagnosis not present

## 2018-02-17 NOTE — Telephone Encounter (Signed)
Per Richardson Dopp, PA, clearance was faxed to [facility] already. Per PA request I called [facility], lmom calling to verify clearance was received . I did ask for a call back if not received. I will be happy to re-fax clearance if needed.

## 2018-02-17 NOTE — Telephone Encounter (Signed)
Sarah from [facility] called back in regards to if received fax for Epidural Steroid Injection. See previous notes. Veronda Prude, LPN noted received fax 02/17/18 @ 7:45 for Epidural Steroid Injection scheduled for 03/02/18. Per Judson Roch at [facility] pt is was not scheduled for Epidural Injection. Pt was scheduled and has already completed a Mylegram. I apologized for our call and our error.

## 2018-02-17 NOTE — Telephone Encounter (Signed)
RECEIVED FAX REQUESTING MEDICAL CLEARANCE FOR  EPIDURAL STEROID INJECTION SCHEDULED FOR 03-02-2018, (ALREADY HAVE Per pharmacy recs, ok to hold Xarelto 3 days prior to injection )

## 2018-02-17 NOTE — Telephone Encounter (Signed)
Lyda Jester, PA-C faxed recommendations regarding Xarelto 01/20/18. Please contact [facility] to see if anything else is needed from our office. Richardson Dopp, PA-C    02/17/2018 8:26 AM

## 2018-02-20 ENCOUNTER — Other Ambulatory Visit: Payer: Self-pay | Admitting: Cardiology

## 2018-02-28 ENCOUNTER — Telehealth: Payer: Self-pay | Admitting: Cardiology

## 2018-02-28 NOTE — Telephone Encounter (Signed)
   Primary Cardiologist:David Ellyn Hack, MD  This was taken care of in Feb 2019.  See phone note from 01/18/18.  Re-fax phone note from 01/18/18.  Looks like there was some confusion about procedure at that time (?wrong patient).  If needed, please clarify with requesting surgeon's office what is needed on which patient.  Richardson Dopp, PA-C  02/28/2018, 5:33 PM

## 2018-02-28 NOTE — Telephone Encounter (Signed)
Follow up     Debra Campbell is following up on surgical clearance patient to have injections 03/02/18 and still has not received clearance back

## 2018-03-02 DIAGNOSIS — M5136 Other intervertebral disc degeneration, lumbar region: Secondary | ICD-10-CM | POA: Diagnosis not present

## 2018-03-28 DIAGNOSIS — M545 Low back pain: Secondary | ICD-10-CM | POA: Diagnosis not present

## 2018-04-11 ENCOUNTER — Inpatient Hospital Stay: Payer: Medicare Other | Attending: Gynecology | Admitting: Gynecology

## 2018-04-11 ENCOUNTER — Encounter: Payer: Self-pay | Admitting: Gynecology

## 2018-04-11 VITALS — BP 140/76 | HR 71 | Temp 97.7°F | Resp 20 | Ht 64.0 in | Wt 230.3 lb

## 2018-04-11 DIAGNOSIS — Z90710 Acquired absence of both cervix and uterus: Secondary | ICD-10-CM | POA: Insufficient documentation

## 2018-04-11 DIAGNOSIS — Z90722 Acquired absence of ovaries, bilateral: Secondary | ICD-10-CM | POA: Diagnosis not present

## 2018-04-11 DIAGNOSIS — C541 Malignant neoplasm of endometrium: Secondary | ICD-10-CM | POA: Diagnosis not present

## 2018-04-11 NOTE — Progress Notes (Signed)
Consult Note: Gyn-Onc   Debra Campbell 84 y.o. female  Chief Complaint  Patient presents with  . Endometrial cancer [facility])    Assessment :  Stage IB grade 1 endometrial adenocarcinoma (July 2014). Clinically free of disease  Plan: Given that she is free of disease for the last 5 years we will discharge her from our practice.. Should patient wish any management of her incontinence she'll contact us and we will refer her to Dr. Maryland Pink..   Interval history: Patient returns today as previously scheduled. Since her last visit she's done well. Her primary complaint continues to be urinary incontinence. On questioning this sounds more like a urge incontinence problem. The patient does wear a pad. She denies any other GI GU or pelvic symptoms.  At this juncture the patient does not wish to have any further evaluation or management of her incontinence.   HPI: Patient was initially seen in consultation request of Dr. Roselie Awkward regarding management of a newly diagnosed endometrial carcinoma. The patient had some abnormal bleeding approximately 3 months prior to diagnosis.. She underwent a D&C on 04/27/2013 revealing a grade 1 endometrial carcinoma in a background of atypical complex hyperplasia.  Ultrasound exam shows a uterus measures 7.9 x 4.5 x 7.3 cm.  She has a past history of a laparotomy for an ectopic pregnancy. She's also had a total hip replacement.  Patient underwent a total abdominal hysterectomy bilateral salpingo-oophorectomy and pelvic lymphadenectomy on 06/26/2013. Final pathology showed a stage IB grade 1 endometrial carcinoma. No adjuvant therapy was recommended.  Review of Systems:10 point review of systems is negative except as noted in interval history.   Vitals: Blood pressure 140/76, pulse 71, temperature 97.7 F (36.5 C), temperature source Oral, resp. rate 20, height 5\' 4"  (1.626 m), weight 230 lb 4.8 oz (104.5 kg), SpO2 98 %.  Physical Exam: General : The patient  is a healthy woman in no acute distress.  HEENT: normocephalic, extraoccular movements normal; neck is supple without thyromegally  Lynphnodes: Supraclavicular and inguinal nodes not enlarged  Abdomen: Soft, non-tender, no ascites, no organomegally, no masses, small umbilical hernia. Midline incision is healing well Pelvic:  EGBUS: Normal female  Vagina: Normal, no lesions  Urethra and Bladder: Normal, non-tender  Cervix: Surgically absent  Uterus: Surgically absent Bi-manual examination: Non-tender; no adenxal masses or nodularity  Rectal: normal sphincter tone, no masses, no blood  Lower extremities: No edema or varicosities. Normal range of motion      Allergies  Allergen Reactions  . Ace Inhibitors Cough  . Iohexol      Desc: HIVES 40 YEARS AGO   . Statins     myalgia  . Penicillins Swelling and Rash    Has patient had a PCN reaction causing immediate rash, facial/tongue/throat swelling, SOB or lightheadedness with hypotension: Yes Has patient had a PCN reaction causing severe rash involving mucus membranes or skin necrosis: No Has patient had a PCN reaction that required hospitalization Yes Has patient had a PCN reaction occurring within the last 10 years: No If all of the above answers are "NO", then may proceed with Cephalosporin use.   . Sulfa Antibiotics Rash    Past Medical History:  Diagnosis Date  . Aortic calcification ([place]) 2013   Atherosclerotic calcifications of the abdominal aorta and branch noted on CT Abd/Pelvis  . Arthritis    legs, lower back, hands  . Cancer ([place])    endometrial  . Diabetes mellitus   . Ectopic pregnancy   .  Eczema   . Headache(784.0)    hx of  . History of kidney stones   . Hyperlipidemia    no meds  . Hypertension   . Hypothyroidism   . Persistent atrial fibrillation ([place]) 09/28/2016   Relatively new Dx: Asymptomatic.  Rate controlled without medication.  . SVD (spontaneous vaginal delivery)    x 3  . Thyroid disease      Past Surgical History:  Procedure Laterality Date  . ABDOMINAL HYSTERECTOMY Bilateral 06/26/2013   Procedure: TOTAL ABDOMINAL HYSTERECTOMY WITH BILATERAL SALPINGO OOPHERECTOMY WITH PELVIC LYMPHADNECTOMY;  Surgeon: Alvino Chapel, MD;  Location: [facility];  Service: Gynecology;  Laterality: Bilateral;  . BACK SURGERY  06/26/2013  . ECTOPIC PREGNANCY SURGERY    . HERNIA REPAIR    . HYSTEROSCOPY W/D&C N/A 04/26/2013   Procedure: DILATATION AND CURETTAGE /HYSTEROSCOPY;  Surgeon: Woodroe Mode, MD;  Location: [facility];  Service: Gynecology;  Laterality: N/A;  . JOINT REPLACEMENT    . LAPAROTOMY     for ectopic pregnancy  . LAPAROTOMY N/A 06/26/2013   Procedure: EXPLORATORY LAPAROTOMY;  Surgeon: Alvino Chapel, MD;  Location: [facility];  Service: Gynecology;  Laterality: N/A;  . lt knee arthroscopy    . rt total hip replacement    . TONSILLECTOMY    . TOTAL KNEE ARTHROPLASTY Left 12/22/2016   Procedure: LEFT TOTAL KNEE ARTHROPLASTY;  Surgeon: Latanya Maudlin, MD;  Location: [facility];  Service: Orthopedics;  Laterality: Left;  . WISDOM TOOTH EXTRACTION      Current Outpatient Medications  Medication Sig Dispense Refill  . acetaminophen (TYLENOL) 500 MG tablet Take 500 mg by mouth every 6 (six) hours as needed.    . Elastic Bandages & Supports (V-2 HIGH COMPRESSION HOSE) MISC Wear daily when up on feet 1 each 0  . furosemide (LASIX) 40 MG tablet Take 1 tablet (40 mg total) by mouth 2 (two) times daily. 180 tablet 1  . gabapentin (NEURONTIN) 300 MG capsule Take 1 capsule (300 mg total) by mouth 2 (two) times daily. 180 capsule 1  . glucose blood (ACCU-CHEK AVIVA PLUS) test strip Test 1x per day and prn. DX.E11.9 100 each 12  . levothyroxine (SYNTHROID, LEVOTHROID) 175 MCG tablet Take 1 tablet (175 mcg total) by mouth daily before breakfast. 90 tablet 1  . losartan (COZAAR) 50 MG tablet Take 1 tablet (50 mg total) by mouth daily. 90 tablet 1  . metFORMIN (GLUCOPHAGE) 1000 MG tablet  TAKE 1 TABLET TWICE DAILY WITH A MEAL 180 tablet 1  . methocarbamol (ROBAXIN) 500 MG tablet Take 1 tablet (500 mg total) by mouth every 6 (six) hours as needed for muscle spasms. 40 tablet 1  . metoprolol succinate (TOPROL-XL) 25 MG 24 hr tablet Take 1 tablet (25 mg total) by mouth daily. 90 tablet 1  . triamcinolone cream (KENALOG) 0.5 % Apply 1 application topically 3 (three) times daily as needed.    . TRUEPLUS LANCETS 30G MISC CHECK BLOOD SUGAR ONCE A DAY AND AS NEEDED 100 each 2  . XARELTO 20 MG TABS tablet TAKE 1 TABLET EVERY DAY WITH BREAKFAST 90 tablet 1   No current facility-administered medications for this visit.     Social History   Socioeconomic History  . Marital status: Married    Spouse name: Not on file  . Number of children: 3  . Years of education: Not on file  . Highest education level: Not on file  Occupational History  . Occupation: Retired  Comment: [employer]  Social Needs  . Financial resource strain: Not hard at all  . Food insecurity:    Worry: Never true    Inability: Never true  . Transportation needs:    Medical: No    Non-medical: No  Tobacco Use  . Smoking status: Never Smoker  . Smokeless tobacco: Never Used  Substance and Sexual Activity  . Alcohol use: No  . Drug use: No  . Sexual activity: Never    Birth control/protection: Post-menopausal  Lifestyle  . Physical activity:    Days per week: 0 days    Minutes per session: 0 min  . Stress: To some extent  Relationships  . Social connections:    Talks on phone: More than three times a week    Gets together: More than three times a week    Attends religious service: Never    Active member of club or organization: No    Attends meetings of clubs or organizations: Never    Relationship status: Married  . Intimate partner violence:    Fear of current or ex partner: No    Emotionally abused: No    Physically abused: No    Forced sexual activity: No  Other Topics Concern  . Not on file   Social History Narrative  . Not on file    Family History  Problem Relation Age of Onset  . Heart disease Father   . Thyroid disease Father   . Heart attack Father   . Heart disease Sister        open heart surgery  . Cancer Sister        breast  . Neuropathy Sister        secondary to cancer treatment  . Breast cancer Sister   . Suicidality Brother 6  . Arthritis Mother   . Cancer Daughter   . Breast cancer Daughter       Marti Sleigh, MD 04/11/2018, 10:59 AM

## 2018-04-11 NOTE — Patient Instructions (Signed)
No follow up needed at this time.  Plan to follow up with your primary care provider.  Please call for any new gynecologic symptoms or concerns.

## 2018-04-13 ENCOUNTER — Ambulatory Visit (INDEPENDENT_AMBULATORY_CARE_PROVIDER_SITE_OTHER): Payer: Medicare Other | Admitting: Nurse Practitioner

## 2018-04-13 ENCOUNTER — Encounter: Payer: Self-pay | Admitting: Nurse Practitioner

## 2018-04-13 VITALS — BP 131/80 | HR 75 | Temp 97.4°F | Ht 64.0 in | Wt 229.0 lb

## 2018-04-13 DIAGNOSIS — I4819 Other persistent atrial fibrillation: Secondary | ICD-10-CM

## 2018-04-13 DIAGNOSIS — E034 Atrophy of thyroid (acquired): Secondary | ICD-10-CM

## 2018-04-13 DIAGNOSIS — R609 Edema, unspecified: Secondary | ICD-10-CM

## 2018-04-13 DIAGNOSIS — M150 Primary generalized (osteo)arthritis: Secondary | ICD-10-CM | POA: Diagnosis not present

## 2018-04-13 DIAGNOSIS — E1142 Type 2 diabetes mellitus with diabetic polyneuropathy: Secondary | ICD-10-CM | POA: Diagnosis not present

## 2018-04-13 DIAGNOSIS — M159 Polyosteoarthritis, unspecified: Secondary | ICD-10-CM

## 2018-04-13 DIAGNOSIS — M8588 Other specified disorders of bone density and structure, other site: Secondary | ICD-10-CM

## 2018-04-13 DIAGNOSIS — E785 Hyperlipidemia, unspecified: Secondary | ICD-10-CM | POA: Diagnosis not present

## 2018-04-13 DIAGNOSIS — I10 Essential (primary) hypertension: Secondary | ICD-10-CM

## 2018-04-13 DIAGNOSIS — I481 Persistent atrial fibrillation: Secondary | ICD-10-CM | POA: Diagnosis not present

## 2018-04-13 DIAGNOSIS — M5432 Sciatica, left side: Secondary | ICD-10-CM

## 2018-04-13 LAB — BAYER DCA HB A1C WAIVED: HB A1C: 7 % — AB (ref <7.0)

## 2018-04-13 MED ORDER — METOPROLOL SUCCINATE ER 25 MG PO TB24: 25.0000 mg | ORAL_TABLET | Freq: Every day | ORAL | 1 refills | Status: DC

## 2018-04-13 MED ORDER — GABAPENTIN 300 MG PO CAPS: 300.0000 mg | ORAL_CAPSULE | Freq: Two times a day (BID) | ORAL | 1 refills | Status: DC

## 2018-04-13 MED ORDER — TRIAMCINOLONE ACETONIDE 0.5 % EX CREA: 1.0000 "application " | TOPICAL_CREAM | Freq: Three times a day (TID) | CUTANEOUS | 1 refills | Status: DC | PRN

## 2018-04-13 MED ORDER — LOSARTAN POTASSIUM 50 MG PO TABS: 50.0000 mg | ORAL_TABLET | Freq: Every day | ORAL | 1 refills | Status: DC

## 2018-04-13 MED ORDER — METFORMIN HCL 1000 MG PO TABS: ORAL_TABLET | ORAL | 1 refills | Status: DC

## 2018-04-13 MED ORDER — FUROSEMIDE 40 MG PO TABS: 40.0000 mg | ORAL_TABLET | Freq: Two times a day (BID) | ORAL | 1 refills | Status: DC

## 2018-04-13 MED ORDER — LEVOTHYROXINE SODIUM 175 MCG PO TABS: 175.0000 ug | ORAL_TABLET | Freq: Every day | ORAL | 1 refills | Status: DC

## 2018-04-13 NOTE — Progress Notes (Signed)
Subjective:    Patient ID: Debra Campbell, female    DOB: 1932/04/01, 84 y.o.   MRN: [id number]   Chief Complaint: medical management of chronic issues   HPI:  1. Type 2 diabetes mellitus with diabetic polyneuropathy, without long-term current use of insulin (HCC) last HGBA1c was 6.8%. Fasting blood sugars around 120 when she checks it. On  Checks about 1 x a week. No hypoglycemia that sh eis awareof.  2. Essential hypertension   no c/o chest pain, sob or headache. Does not check blood pressure at home BP Readings from Last 3 Encounters:  04/13/18 131/80  04/11/18 140/76  02/06/18 (!) 119/51     3. Hyperlipidemia, unspecified hyperlipidemia type  Does not watch diet at all and does very little exercise.  4. Persistent atrial fibrillation ([place])  Denies heart racing. Is on eliquis daily  5. Hypothyroidism due to acquired atrophy of thyroid  No problems that she is aware of.  6. Diabetic polyneuropathy associated with type 2 diabetes mellitus ([place]) Feet burn and hurt all the time. She is on neurotin which helps. Does not go barefooted.  7. Osteopenia of spine  Has been having back pain and has had several injections lately. This is not coming from her osteopenia.  8. Primary osteoarthritis involving multiple joints  She had total knee replacement a year ago  93. Morbid obesity ([place])  No recent weight changes    Outpatient Encounter Medications as of 04/13/2018  Medication Sig  . acetaminophen (TYLENOL) 500 MG tablet Take 500 mg by mouth every 6 (six) hours as needed.  . furosemide (LASIX) 40 MG tablet Take 1 tablet (40 mg total) by mouth 2 (two) times daily.  [name] gabapentin (NEURONTIN) 300 MG capsule Take 1 capsule (300 mg total) by mouth 2 (two) times daily.  [name] glucose blood (ACCU-CHEK AVIVA PLUS) test strip Test 1x per day and prn. DX.E11.9  . levothyroxine (SYNTHROID, LEVOTHROID) 175 MCG tablet Take 1 tablet (175 mcg total) by mouth daily before breakfast.  . losartan  (COZAAR) 50 MG tablet Take 1 tablet (50 mg total) by mouth daily.  . metFORMIN (GLUCOPHAGE) 1000 MG tablet TAKE 1 TABLET TWICE DAILY WITH A MEAL  . methocarbamol (ROBAXIN) 500 MG tablet Take 1 tablet (500 mg total) by mouth every 6 (six) hours as needed for muscle spasms.  . metoprolol succinate (TOPROL-XL) 25 MG 24 hr tablet Take 1 tablet (25 mg total) by mouth daily.  [name] triamcinolone cream (KENALOG) 0.5 % Apply 1 application topically 3 (three) times daily as needed.  . TRUEPLUS LANCETS 30G MISC CHECK BLOOD SUGAR ONCE A DAY AND AS NEEDED  . XARELTO 20 MG TABS tablet TAKE 1 TABLET EVERY DAY WITH BREAKFAST  . [DISCONTINUED] Elastic Bandages & Supports (V-2 HIGH COMPRESSION HOSE) MISC Wear daily when up on feet       New complaints: She say shat she has had increasing swelling of bil lower ext. Wears compression socks sometimes. Tries to elevate feet when sitting  Social history: Lives with her husband. Her children check on her daily.    Review of Systems  Constitutional: Negative for activity change and appetite change.  HENT: Negative.   Eyes: Negative for pain.  Respiratory: Negative for shortness of breath.   Cardiovascular: Negative for chest pain, palpitations and leg swelling.  Gastrointestinal: Negative for abdominal pain.  Endocrine: Negative for polydipsia.  Genitourinary: Negative.   Skin: Negative for rash.  Neurological: Negative for dizziness, weakness and headaches.  Hematological:  Does not bruise/bleed easily.  Psychiatric/Behavioral: Negative.   All other systems reviewed and are negative.      Objective:   Physical Exam  Constitutional: She is oriented to person, place, and time.  HENT:  Head: Normocephalic.  Nose: Nose normal.  Mouth/Throat: Oropharynx is clear and moist.  Eyes: Pupils are equal, round, and reactive to light. EOM are normal.  Neck: Normal range of motion. Neck supple. No JVD present. Carotid bruit is not present.  Cardiovascular:  Normal rate, normal heart sounds and intact distal pulses.  Irregular heart rhythym  Pulmonary/Chest: Effort normal and breath sounds normal. No respiratory distress. She has no wheezes. She has no rales. She exhibits no tenderness.  Abdominal: Soft. Normal appearance, normal aorta and bowel sounds are normal. She exhibits no distension, no abdominal bruit, no pulsatile midline mass and no mass. There is no splenomegaly or hepatomegaly. There is no tenderness.  Musculoskeletal: Normal range of motion. She exhibits edema (2+ edema bil lower ext).  Lymphadenopathy:    She has no cervical adenopathy.  Neurological: She is alert and oriented to person, place, and time. She has normal reflexes.  Skin: Skin is warm and dry. Capillary refill takes less than 2 seconds.  Reddish- purple skin discoloration of bil lower ext.  Psychiatric: She has a normal mood and affect. Her behavior is normal. Judgment and thought content normal.   BP 131/80   Pulse 75   Temp (!) 97.4 F (36.3 C) (Oral)   Ht '5\' 4"'$  (1.626 m)   Wt 229 lb (103.9 kg)   BMI 39.31 kg/m   HGBA1c 7.0       Assessment & Plan:  Debra Campbell comes in today with chief complaint of Medical Management of chronic issues  Diagnosis and orders addressed:  1. Type 2 diabetes mellitus with diabetic polyneuropathy, without long-term current use of insulin (HCC) Low carb diet - Bayer DCA Hb A1c Waived - metFORMIN (GLUCOPHAGE) 1000 MG tablet; TAKE 1 TABLET TWICE DAILY WITH A MEAL  Dispense: 180 tablet; Refill: 1  2. Essential hypertension Low sodium diet - CMP14+EGFR - metoprolol succinate (TOPROL-XL) 25 MG 24 hr tablet; Take 1 tablet (25 mg total) by mouth daily.  Dispense: 90 tablet; Refill: 1 - losartan (COZAAR) 50 MG tablet; Take 1 tablet (50 mg total) by mouth daily.  Dispense: 90 tablet; Refill: 1  3. Hyperlipidemia, unspecified hyperlipidemia type Low fat diet - Lipid panel  4. Persistent atrial fibrillation ([place])  5.  Hypothyroidism due to acquired atrophy of thyroid - levothyroxine (SYNTHROID, LEVOTHROID) 175 MCG tablet; Take 1 tablet (175 mcg total) by mouth daily before breakfast.  Dispense: 90 tablet; Refill: 1  6. Diabetic polyneuropathy associated with type 2 diabetes mellitus ([place]) Do not go barefooted  7. Osteopenia of spine No longer wants to do dexascan Cannot tolerate exercise  8. Primary osteoarthritis involving multiple joints Keep follow up with ortho as needed Avoid NSAIDS due to kidney function  9. Morbid obesity ([place]) Discussed diet and exercise for person with BMI >25 Will recheck weight in 3-6 months  10. Peripheral edema Elevate legs when siting'make sure takes both doses of meds daily - furosemide (LASIX) 40 MG tablet; Take 1 tablet (40 mg total) by mouth 2 (two) times daily.  Dispense: 180 tablet; Refill: 1  11. Sciatica of left side Continue to see specialist - gabapentin (NEURONTIN) 300 MG capsule; Take 1 capsule (300 mg total) by mouth 2 (two) times daily.  Dispense: 180 capsule; Refill: 1  Labs pending Health Maintenance reviewed Diet and exercise encouraged  Follow up plan: 3 months   [name] Hassell Done, FNP

## 2018-04-13 NOTE — Patient Instructions (Signed)

## 2018-04-14 LAB — CMP14+EGFR
A/G RATIO: 2 (ref 1.2–2.2)
ALBUMIN: 4.3 g/dL (ref 3.5–4.7)
ALT: 17 IU/L (ref 0–32)
AST: 20 IU/L (ref 0–40)
Alkaline Phosphatase: 51 IU/L (ref 39–117)
BUN/Creatinine Ratio: 12 (ref 12–28)
BUN: 12 mg/dL (ref 8–27)
Bilirubin Total: 0.8 mg/dL (ref 0.0–1.2)
CALCIUM: 9.5 mg/dL (ref 8.7–10.3)
CO2: 25 mmol/L (ref 20–29)
Chloride: 101 mmol/L (ref 96–106)
Creatinine, Ser: 0.98 mg/dL (ref 0.57–1.00)
GFR, EST AFRICAN AMERICAN: 62 mL/min/{1.73_m2} (ref >59)
GFR, EST NON AFRICAN AMERICAN: 54 mL/min/{1.73_m2} — AB (ref >59)
Globulin, Total: 2.2 g/dL (ref 1.5–4.5)
Glucose: 191 mg/dL — ABNORMAL HIGH (ref 65–99)
POTASSIUM: 4.8 mmol/L (ref 3.5–5.2)
Sodium: 142 mmol/L (ref 134–144)
TOTAL PROTEIN: 6.5 g/dL (ref 6.0–8.5)

## 2018-04-14 LAB — LIPID PANEL
CHOL/HDL RATIO: 3.2 ratio (ref 0.0–4.4)
Cholesterol, Total: 200 mg/dL — ABNORMAL HIGH (ref 100–199)
HDL: 62 mg/dL (ref >39)
LDL Calculated: 112 mg/dL — ABNORMAL HIGH (ref 0–99)
Triglycerides: 131 mg/dL (ref 0–149)
VLDL CHOLESTEROL CAL: 26 mg/dL (ref 5–40)

## 2018-04-21 ENCOUNTER — Ambulatory Visit: Payer: Medicare Other | Admitting: Gynecology

## 2018-07-25 ENCOUNTER — Ambulatory Visit (INDEPENDENT_AMBULATORY_CARE_PROVIDER_SITE_OTHER): Payer: Medicare Other | Admitting: Nurse Practitioner

## 2018-07-25 ENCOUNTER — Encounter: Payer: Self-pay | Admitting: Nurse Practitioner

## 2018-07-25 VITALS — BP 108/65 | HR 97 | Temp 97.5°F | Ht 64.0 in | Wt 228.0 lb

## 2018-07-25 DIAGNOSIS — I10 Essential (primary) hypertension: Secondary | ICD-10-CM

## 2018-07-25 DIAGNOSIS — E785 Hyperlipidemia, unspecified: Secondary | ICD-10-CM

## 2018-07-25 DIAGNOSIS — Z8739 Personal history of other diseases of the musculoskeletal system and connective tissue: Secondary | ICD-10-CM | POA: Diagnosis not present

## 2018-07-25 DIAGNOSIS — E1142 Type 2 diabetes mellitus with diabetic polyneuropathy: Secondary | ICD-10-CM

## 2018-07-25 DIAGNOSIS — E034 Atrophy of thyroid (acquired): Secondary | ICD-10-CM

## 2018-07-25 DIAGNOSIS — I700 Atherosclerosis of aorta: Secondary | ICD-10-CM

## 2018-07-25 DIAGNOSIS — R609 Edema, unspecified: Secondary | ICD-10-CM | POA: Diagnosis not present

## 2018-07-25 DIAGNOSIS — I4819 Other persistent atrial fibrillation: Secondary | ICD-10-CM

## 2018-07-25 DIAGNOSIS — I481 Persistent atrial fibrillation: Secondary | ICD-10-CM | POA: Diagnosis not present

## 2018-07-25 LAB — BAYER DCA HB A1C WAIVED: HB A1C: 8.3 % — AB (ref <7.0)

## 2018-07-25 MED ORDER — RIVAROXABAN 20 MG PO TABS: ORAL_TABLET | ORAL | 1 refills | Status: DC

## 2018-07-25 MED ORDER — FUROSEMIDE 40 MG PO TABS: 40.0000 mg | ORAL_TABLET | Freq: Two times a day (BID) | ORAL | 1 refills | Status: DC

## 2018-07-25 MED ORDER — METOPROLOL SUCCINATE ER 25 MG PO TB24: 25.0000 mg | ORAL_TABLET | Freq: Every day | ORAL | 1 refills | Status: DC

## 2018-07-25 MED ORDER — LOSARTAN POTASSIUM 50 MG PO TABS: 50.0000 mg | ORAL_TABLET | Freq: Every day | ORAL | 1 refills | Status: DC

## 2018-07-25 MED ORDER — METFORMIN HCL 1000 MG PO TABS: ORAL_TABLET | ORAL | 1 refills | Status: DC

## 2018-07-25 MED ORDER — LEVOTHYROXINE SODIUM 175 MCG PO TABS: 175.0000 ug | ORAL_TABLET | Freq: Every day | ORAL | 1 refills | Status: DC

## 2018-07-25 MED ORDER — GABAPENTIN 300 MG PO CAPS: 300.0000 mg | ORAL_CAPSULE | Freq: Two times a day (BID) | ORAL | 1 refills | Status: DC

## 2018-07-25 NOTE — Patient Instructions (Signed)

## 2018-07-25 NOTE — Progress Notes (Signed)
Subjective:    Patient ID: Debra Campbell, female    DOB: [birth year], 84 y.o.   MRN: [id number]   Chief Complaint: Medical Management of Chronic issues  HPI:  1. Type 2 diabetes mellitus with diabetic polyneuropathy, without long-term current use of insulin ([place]) last hgba1c was 7.0%. Does not check blood sugars very often at home. Denies feeling like blood sugars are low.  2. Essential hypertension  No c/o chest pain, sob or headache. Does not check blood pressure at home. BP Readings from Last 3 Encounters:  04/13/18 131/80  04/11/18 140/76  02/06/18 (!) 119/51     3. Hyperlipidemia, unspecified hyperlipidemia type  On no statin. Does not watch diet and does very little exercise  4. Persistent atrial fibrillation (HCC)  Currently on xeralto. No bleeding noted  5. Aortic calcification (HCC)  Seen on chest xray. Last chest xray was done 12/17/16.  6. Hypothyroidism due to acquired atrophy of thyroid  No issues that she is aware of  7. Diabetic polyneuropathy associated with type 2 diabetes mellitus ([place])  10. Peripheral edema  8. Osteopenia of spine  Last BMD test was done with t score of -1.0- she is now considered normal. Is currently on no meds.  9. Morbid obesity ([place])  No recent weight changes  10.    Peripheral edema          Has compression hose but does not wear everyday.   Outpatient Encounter Medications as of 07/25/2018  Medication Sig  . acetaminophen (TYLENOL) 500 MG tablet Take 500 mg by mouth every 6 (six) hours as needed.  . furosemide (LASIX) 40 MG tablet Take 1 tablet (40 mg total) by mouth 2 (two) times daily.  [name] gabapentin (NEURONTIN) 300 MG capsule Take 1 capsule (300 mg total) by mouth 2 (two) times daily.  [name] glucose blood (ACCU-CHEK AVIVA PLUS) test strip Test 1x per day and prn. DX.E11.9  . levothyroxine (SYNTHROID, LEVOTHROID) 175 MCG tablet Take 1 tablet (175 mcg total) by mouth daily before breakfast.  . losartan (COZAAR) 50 MG tablet Take 1  tablet (50 mg total) by mouth daily.  . metFORMIN (GLUCOPHAGE) 1000 MG tablet TAKE 1 TABLET TWICE DAILY WITH A MEAL  . methocarbamol (ROBAXIN) 500 MG tablet Take 1 tablet (500 mg total) by mouth every 6 (six) hours as needed for muscle spasms.  . metoprolol succinate (TOPROL-XL) 25 MG 24 hr tablet Take 1 tablet (25 mg total) by mouth daily.  [name] triamcinolone cream (KENALOG) 0.5 % Apply 1 application topically 3 (three) times daily as needed.  . TRUEPLUS LANCETS 30G MISC CHECK BLOOD SUGAR ONCE A DAY AND AS NEEDED  . XARELTO 20 MG TABS tablet TAKE 1 TABLET EVERY DAY WITH BREAKFAST      New complaints: None today  Social history: Lives with her husband- he styas gine alot   Review of Systems  Constitutional: Negative for activity change and appetite change.  HENT: Negative.   Eyes: Negative for pain.  Respiratory: Negative for shortness of breath.   Cardiovascular: Positive for leg swelling. Negative for chest pain and palpitations.  Gastrointestinal: Negative for abdominal pain.  Endocrine: Negative for polydipsia.  Genitourinary: Negative.   Musculoskeletal: Positive for arthralgias.  Skin: Negative for rash.  Neurological: Negative for dizziness, weakness and headaches.  Hematological: Does not bruise/bleed easily.  Psychiatric/Behavioral: Negative.   All other systems reviewed and are negative.      Objective:   Physical Exam  Constitutional: She is oriented to  person, place, and time. She appears well-developed and well-nourished. No distress.  HENT:  Head: Normocephalic.  Nose: Nose normal.  Mouth/Throat: Oropharynx is clear and moist.  Eyes: Pupils are equal, round, and reactive to light. EOM are normal.  Neck: Normal range of motion. Neck supple. No JVD present. Carotid bruit is not present.  Cardiovascular: Normal rate, normal heart sounds and intact distal pulses.  Irregular heart beat  Pulmonary/Chest: Effort normal and breath sounds normal. No respiratory  distress. She has no wheezes. She has no rales. She exhibits no tenderness.  Abdominal: Soft. Normal appearance, normal aorta and bowel sounds are normal. She exhibits no distension, no abdominal bruit, no pulsatile midline mass and no mass. There is no splenomegaly or hepatomegaly. There is no tenderness.  Musculoskeletal: Normal range of motion. She exhibits edema (3+ edema bil lower ext).  Lymphadenopathy:    She has no cervical adenopathy.  Neurological: She is alert and oriented to person, place, and time. She has normal reflexes.  Skin: Skin is warm and dry.  Psychiatric: She has a normal mood and affect. Her behavior is normal. Judgment and thought content normal.  Nursing note and vitals reviewed.   BP 108/65   Pulse 97   Temp (!) 97.5 F (36.4 C) (Oral)   Ht _0  (1.626 m)   Wt 228 lb (103.4 kg)   BMI 39.14 kg/m   hgba1c 7.4%     Assessment & Plan:  Debra Campbell comes in today with chief complaint of No chief complaint on file.   Diagnosis and orders addressed:  1. Type 2 diabetes mellitus with diabetic polyneuropathy, without long-term current use of insulin (HCC) Continue to watch carbs in diet  - Bayer DCA Hb A1c Waived - metFORMIN (GLUCOPHAGE) 1000 MG tablet; TAKE 1 TABLET TWICE DAILY WITH A MEAL  Dispense: 180 tablet; Refill: 1  2. Essential hypertension Low sodium diet - CMP14+EGFR - losartan (COZAAR) 50 MG tablet; Take 1 tablet (50 mg total) by mouth daily.  Dispense: 90 tablet; Refill: 1 - metoprolol succinate (TOPROL-XL) 25 MG 24 hr tablet; Take 1 tablet (25 mg total) by mouth daily.  Dispense: 90 tablet; Refill: 1  3. Hyperlipidemia, unspecified hyperlipidemia type Low fat diet - Lipid panel  4. Persistent atrial fibrillation ([place]) Keep follow up with cardiology Continue xeralto as rx  5. Aortic calcification (HCC) We will continue to watch  6. Hypothyroidism due to acquired atrophy of thyroid - levothyroxine (SYNTHROID, LEVOTHROID) 175 MCG  tablet; Take 1 tablet (175 mcg total) by mouth daily before breakfast.  Dispense: 90 tablet; Refill: 1  7. Diabetic polyneuropathy associated with type 2 diabetes mellitus ([place]) Do not go bare footed Make sure check people daily - gabapentin (NEURONTIN) 300 MG capsule; Take 1 capsule (300 mg total) by mouth 2 (two) times daily.  Dispense: 180 capsule; Refill: 1  8. Hx of osteopenia Weight beaing exercise when can tolerate  9. Morbid obesity ([place]) Discussed diet and exercise for person with BMI >25 Will recheck weight in 3-6 months  10. Peripheral edema elvtae legs when sitting Take both lasix at sametime if cannot take BID - furosemide (LASIX) 40 MG tablet; Take 1 tablet (40 mg total) by mouth 2 (two) times daily.  Dispense: 180 tablet; Refill: 1   Labs pending Health Maintenance reviewed Diet and exercise encouraged  Follow up plan: 3 months   [name] Hassell Done, FNP

## 2018-07-26 LAB — CMP14+EGFR
ALT: 13 IU/L (ref 0–32)
AST: 17 IU/L (ref 0–40)
Albumin/Globulin Ratio: 1.7 (ref 1.2–2.2)
Albumin: 4.3 g/dL (ref 3.5–4.7)
Alkaline Phosphatase: 48 IU/L (ref 39–117)
BUN/Creatinine Ratio: 15 (ref 12–28)
BUN: 15 mg/dL (ref 8–27)
Bilirubin Total: 0.7 mg/dL (ref 0.0–1.2)
CO2: 25 mmol/L (ref 20–29)
CREATININE: 0.99 mg/dL (ref 0.57–1.00)
Calcium: 9.5 mg/dL (ref 8.7–10.3)
Chloride: 100 mmol/L (ref 96–106)
GFR calc Af Amer: 61 mL/min/{1.73_m2} (ref >59)
GFR calc non Af Amer: 52 mL/min/{1.73_m2} — ABNORMAL LOW (ref >59)
GLUCOSE: 183 mg/dL — AB (ref 65–99)
Globulin, Total: 2.6 g/dL (ref 1.5–4.5)
Potassium: 4.2 mmol/L (ref 3.5–5.2)
SODIUM: 142 mmol/L (ref 134–144)
Total Protein: 6.9 g/dL (ref 6.0–8.5)

## 2018-07-26 LAB — LIPID PANEL
CHOLESTEROL TOTAL: 190 mg/dL (ref 100–199)
Chol/HDL Ratio: 3.6 ratio (ref 0.0–4.4)
HDL: 53 mg/dL (ref >39)
LDL CALC: 108 mg/dL — AB (ref 0–99)
TRIGLYCERIDES: 147 mg/dL (ref 0–149)
VLDL CHOLESTEROL CAL: 29 mg/dL (ref 5–40)

## 2018-08-09 ENCOUNTER — Encounter: Payer: Self-pay | Admitting: Physician Assistant

## 2018-08-09 ENCOUNTER — Ambulatory Visit (INDEPENDENT_AMBULATORY_CARE_PROVIDER_SITE_OTHER): Payer: Medicare Other | Admitting: Physician Assistant

## 2018-08-09 VITALS — BP 138/72 | HR 106 | Temp 97.0°F | Ht 64.0 in | Wt 234.8 lb

## 2018-08-09 DIAGNOSIS — B356 Tinea cruris: Secondary | ICD-10-CM

## 2018-08-09 DIAGNOSIS — L03119 Cellulitis of unspecified part of limb: Secondary | ICD-10-CM

## 2018-08-09 DIAGNOSIS — E1142 Type 2 diabetes mellitus with diabetic polyneuropathy: Secondary | ICD-10-CM

## 2018-08-09 DIAGNOSIS — I872 Venous insufficiency (chronic) (peripheral): Secondary | ICD-10-CM | POA: Diagnosis not present

## 2018-08-09 MED ORDER — NYSTATIN 100000 UNIT/GM EX CREA: 1.0000 "application " | TOPICAL_CREAM | Freq: Two times a day (BID) | CUTANEOUS | 0 refills | Status: DC

## 2018-08-09 MED ORDER — GLUCOSE BLOOD VI STRP: ORAL_STRIP | 12 refills | Status: DC

## 2018-08-09 MED ORDER — CLINDAMYCIN HCL 150 MG PO CAPS: 150.0000 mg | ORAL_CAPSULE | Freq: Three times a day (TID) | ORAL | 0 refills | Status: DC

## 2018-08-09 NOTE — Progress Notes (Signed)
BP 138/72   Pulse (!) 106   Temp (!) 97 F (36.1 C) (Oral)   Ht 5' 4"  (1.626 m)   Wt 234 lb 12.8 oz (106.5 kg)   BMI 40.30 kg/m    Subjective:    Patient ID: Debra Campbell, female    DOB: 07-Apr-1932, 84 y.o.   MRN: [id number]  HPI: Debra Campbell is a 83 y.o. female presenting on 08/09/2018 for Rash (feet, legs, groin and under breast )  Patient comes in today with erythema and weeping.  She has had significant edema in her lower legs.  She has had this going on for some time.  Just in recent days it is gotten much worse.  She denies any fever or chills.  She has tried to wear her compression stockings but the skin is weeping too much.  All of her current medications are reviewed.  She does take Lasix 80 mg once daily.  She states she has never been for any vascular testing  She also reports redness and burning under her breast and into her groin.  She has had problems with yeast.  In the past.  Past Medical History:  Diagnosis Date  . Aortic calcification ([place]) 2013   Atherosclerotic calcifications of the abdominal aorta and branch noted on CT Abd/Pelvis  . Arthritis    legs, lower back, hands  . Cancer ([place])    endometrial  . Diabetes mellitus   . Ectopic pregnancy   . Eczema   . Headache(784.0)    hx of  . History of kidney stones   . Hyperlipidemia    no meds  . Hypertension   . Hypothyroidism   . Persistent atrial fibrillation ([place]) 09/28/2016   Relatively new Dx: Asymptomatic.  Rate controlled without medication.  . SVD (spontaneous vaginal delivery)    x 3  . Thyroid disease    Relevant past medical, surgical, family and social history reviewed and updated as indicated. Interim medical history since our last visit reviewed. Allergies and medications reviewed and updated. DATA REVIEWED: CHART IN EPIC  Family History reviewed for pertinent findings.  Review of Systems  Constitutional: Negative.   HENT: Negative.   Eyes: Negative.   Respiratory: Negative.    Gastrointestinal: Negative.   Genitourinary: Negative.   Skin: Positive for color change and rash.    Allergies as of 08/09/2018      Reactions   Ace Inhibitors Cough   Iohexol     Desc: HIVES 40 YEARS AGO   Statins    myalgia   Penicillins Swelling, Rash   Has patient had a PCN reaction causing immediate rash, facial/tongue/throat swelling, SOB or lightheadedness with hypotension: Yes Has patient had a PCN reaction causing severe rash involving mucus membranes or skin necrosis: No Has patient had a PCN reaction that required hospitalization Yes Has patient had a PCN reaction occurring within the last 10 years: No If all of the above answers are "NO", then may proceed with Cephalosporin use.   Sulfa Antibiotics Rash      Medication List        Accurate as of 08/09/18 12:34 PM. Always use your most recent med list.          acetaminophen 500 MG tablet Commonly known as:  TYLENOL Take 500 mg by mouth every 6 (six) hours as needed.   clindamycin 150 MG capsule Commonly known as:  CLEOCIN Take 1 capsule (150 mg total) by mouth 3 (three) times daily.  furosemide 40 MG tablet Commonly known as:  LASIX Take 1 tablet (40 mg total) by mouth 2 (two) times daily.   gabapentin 300 MG capsule Commonly known as:  NEURONTIN Take 1 capsule (300 mg total) by mouth 2 (two) times daily.   glucose blood test strip Test 1x per day and prn. DX.E11.9   levothyroxine 175 MCG tablet Commonly known as:  SYNTHROID, LEVOTHROID Take 1 tablet (175 mcg total) by mouth daily before breakfast.   losartan 50 MG tablet Commonly known as:  COZAAR Take 1 tablet (50 mg total) by mouth daily.   metFORMIN 1000 MG tablet Commonly known as:  GLUCOPHAGE TAKE 1 TABLET TWICE DAILY WITH A MEAL   methocarbamol 500 MG tablet Commonly known as:  ROBAXIN Take 1 tablet (500 mg total) by mouth every 6 (six) hours as needed for muscle spasms.   metoprolol succinate 25 MG 24 hr tablet Commonly known as:   TOPROL-XL Take 1 tablet (25 mg total) by mouth daily.   nystatin cream Commonly known as:  MYCOSTATIN Apply 1 application topically 2 (two) times daily.   rivaroxaban 20 MG Tabs tablet Commonly known as:  XARELTO TAKE 1 TABLET EVERY DAY WITH BREAKFAST   triamcinolone cream 0.5 % Commonly known as:  KENALOG Apply 1 application topically 3 (three) times daily as needed.   TRUEPLUS LANCETS 30G Misc CHECK BLOOD SUGAR ONCE A DAY AND AS NEEDED          Objective:    BP 138/72   Pulse (!) 106   Temp (!) 97 F (36.1 C) (Oral)   Ht 5' 4"  (1.626 m)   Wt 234 lb 12.8 oz (106.5 kg)   BMI 40.30 kg/m   Allergies  Allergen Reactions  . Ace Inhibitors Cough  . Iohexol      Desc: HIVES 40 YEARS AGO   . Statins     myalgia  . Penicillins Swelling and Rash    Has patient had a PCN reaction causing immediate rash, facial/tongue/throat swelling, SOB or lightheadedness with hypotension: Yes Has patient had a PCN reaction causing severe rash involving mucus membranes or skin necrosis: No Has patient had a PCN reaction that required hospitalization Yes Has patient had a PCN reaction occurring within the last 10 years: No If all of the above answers are "NO", then may proceed with Cephalosporin use.   . Sulfa Antibiotics Rash    Wt Readings from Last 3 Encounters:  08/09/18 234 lb 12.8 oz (106.5 kg)  07/25/18 228 lb (103.4 kg)  04/13/18 229 lb (103.9 kg)    Physical Exam  Constitutional: She is oriented to person, place, and time. She appears well-developed and well-nourished.  HENT:  Head: Normocephalic and atraumatic.  Eyes: Pupils are equal, round, and reactive to light. Conjunctivae and EOM are normal.  Cardiovascular: Normal rate, regular rhythm, normal heart sounds and intact distal pulses.  Pulmonary/Chest: Effort normal and breath sounds normal.  Abdominal: Soft. Bowel sounds are normal.  Neurological: She is alert and oriented to person, place, and time. She has normal  reflexes.  Skin: Skin is warm and dry. Lesion and rash noted. Rash is maculopapular. There is erythema.     Stasis dermatitis changes to both lower legs. Current erythema and weeping.  Psychiatric: She has a normal mood and affect. Her behavior is normal. Judgment and thought content normal.    Results for orders placed or performed in visit on 07/25/18  Bayer DCA Hb A1c Waived  Result Value Ref  Range   HB A1C (BAYER DCA - WAIVED) 8.3 (H) <7.0 %  CMP14+EGFR  Result Value Ref Range   Glucose 183 (H) 65 - 99 mg/dL   BUN 15 8 - 27 mg/dL   Creatinine, Ser 0.99 0.57 - 1.00 mg/dL   GFR calc non Af Amer 52 (L) >59 mL/min/1.73   GFR calc Af Amer 61 >59 mL/min/1.73   BUN/Creatinine Ratio 15 12 - 28   Sodium 142 134 - 144 mmol/L   Potassium 4.2 3.5 - 5.2 mmol/L   Chloride 100 96 - 106 mmol/L   CO2 25 20 - 29 mmol/L   Calcium 9.5 8.7 - 10.3 mg/dL   Total Protein 6.9 6.0 - 8.5 g/dL   Albumin 4.3 3.5 - 4.7 g/dL   Globulin, Total 2.6 1.5 - 4.5 g/dL   Albumin/Globulin Ratio 1.7 1.2 - 2.2   Bilirubin Total 0.7 0.0 - 1.2 mg/dL   Alkaline Phosphatase 48 39 - 117 IU/L   AST 17 0 - 40 IU/L   ALT 13 0 - 32 IU/L  Lipid panel  Result Value Ref Range   Cholesterol, Total 190 100 - 199 mg/dL   Triglycerides 147 0 - 149 mg/dL   HDL 53 >39 mg/dL   VLDL Cholesterol Cal 29 5 - 40 mg/dL   LDL Calculated 108 (H) 0 - 99 mg/dL   Chol/HDL Ratio 3.6 0.0 - 4.4 ratio      Assessment & Plan:   1. Cellulitis of lower extremity, unspecified laterality - clindamycin (CLEOCIN) 150 MG capsule; Take 1 capsule (150 mg total) by mouth 3 (three) times daily.  Dispense: 30 capsule; Refill: 0 - glucose blood (ACCU-CHEK AVIVA PLUS) test strip; Test 1x per day and prn. DX.E11.9  Dispense: 100 each; Refill: 12  2. Stasis dermatitis of both legs - clindamycin (CLEOCIN) 150 MG capsule; Take 1 capsule (150 mg total) by mouth 3 (three) times daily.  Dispense: 30 capsule; Refill: 0 TAKE LASIX 20 mg extra for 2 days Call  in one day if no fluid is reduced  3. Type 2 diabetes mellitus with diabetic polyneuropathy ([facility]) - glucose blood (ACCU-CHEK AVIVA PLUS) test strip; Test 1x per day and prn. DX.E11.9  Dispense: 100 each; Refill: 12 Needed refills  4. Tinea cruris - nystatin cream (MYCOSTATIN); Apply 1 application topically 2 (two) times daily.  Dispense: 3060 g; Refill: 0   Continue all other maintenance medications as listed above.  Follow up plan: No follow-ups on file.  Educational handout given for [place] PA-C [facility] [address]  [place], [place] [address] [phone]   08/09/2018, 12:34 PM

## 2018-08-11 ENCOUNTER — Other Ambulatory Visit: Payer: Self-pay

## 2018-08-11 MED ORDER — ACCU-CHEK AVIVA DEVI: 0 refills | Status: AC

## 2018-09-13 IMAGING — CT CT L SPINE W/ CM
1 of 6 series · 3 of 14 positions shown, 4 images · non-contrast
Comparison: None

CLINICAL DATA: Low back pain.  LEFT leg pain.
TECHNIQUE: Contiguous axial images were obtained through the Lumbar spine after
the intrathecal infusion of infusion. Coronal and sagittal
reconstructions were obtained of the axial image sets.

[Series 2: l spine soft · axial · 0.37mm/px · z∈[-38,+61]mm · 3 of 67 slices shown, 4 images]
[im 17/67  soft-tissue]
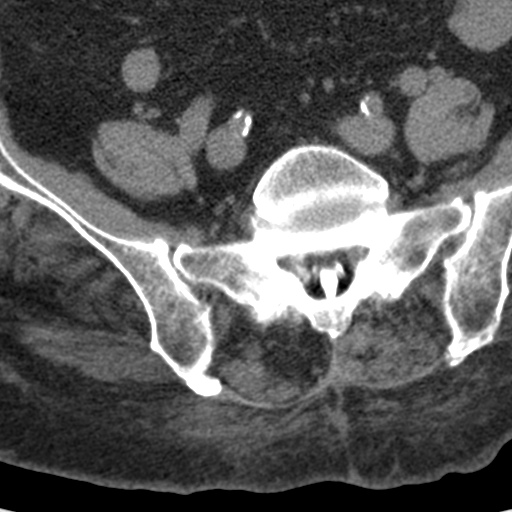
[im 17/67  bone]
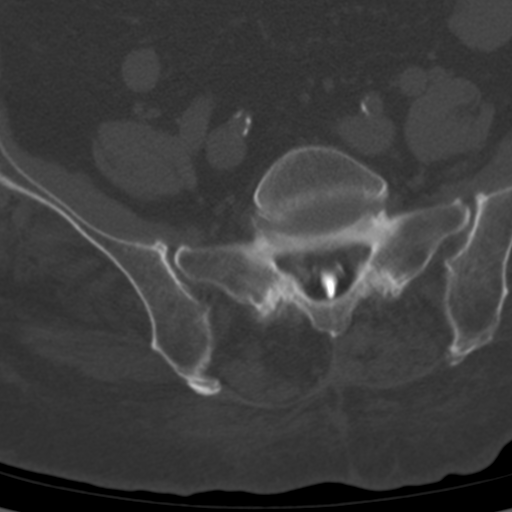
[im 34/67  bone]
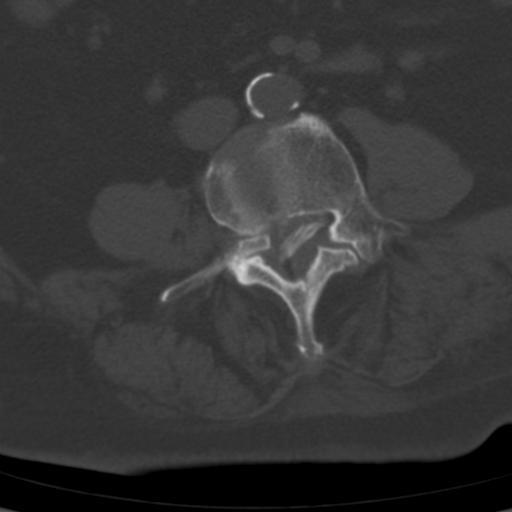
[im 50/67  bone]
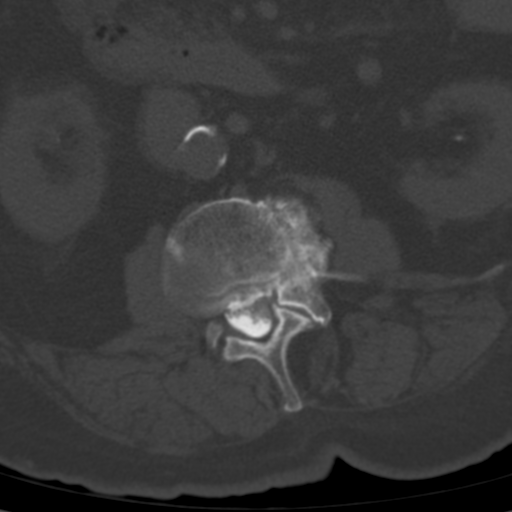

[3 of 14 positions shown; findings below may reference images not displayed]

EXAM:
LUMBAR MYELOGRAM

FLUOROSCOPY TIME:  47 seconds corresponding to a Dose Area Product
of 387.2 Gy*m2

PROCEDURE:
After thorough discussion of risks and benefits of the procedure
including bleeding, infection, injury to nerves, blood vessels,
adjacent structures as well as headache and CSF leak, written and
oral informed consent was obtained. Consent was obtained by Dr. Kunstdepot
May[place]. Time out form was completed.

Patient was positioned prone on the fluoroscopy table. Local
anesthesia was provided with 1% lidocaine without epinephrine after
prepped and draped in the usual sterile fashion. Puncture was
performed at L4-5 using a 3 1/2 inch 22-gauge spinal needle via
midline approach. Using a single pass through the dura, the needle
was placed within the thecal sac, with return of clear CSF. 15 mL of
Isovue T-RXX was injected into the thecal sac, with normal
opacification of the nerve roots and cauda equina consistent with
free flow within the subarachnoid space.

I personally performed the lumbar puncture and administered the
intrathecal contrast. I also personally supervised acquisition of
the myelogram images.
FINDINGS: LUMBAR MYELOGRAM FINDINGS:

Good opacification lumbar subarachnoid space. Degenerative scoliosis
convex RIGHT centered at L2, approximately 35 degrees. Severe
stenosis at L3-4, with effacement of both L4 nerve roots. Extradural
defects on the LEFT at L1-2 and L2-3 could affect the subarticular
zone nerve roots at those levels, L2 and L3 respectively. Less well
visualized thecal sac at T12-L1, with extradural defect on the LEFT
is also noted.

3 mm of facet mediated anterolisthesis at L5-S1 is noted. This is
unchanged through standing flexion and extension. No other areas of
significant slip, or dynamic instability, are noted.

Previous RIGHT hip replacement.  Severe LEFT hip DJD.

CT LUMBAR MYELOGRAM FINDINGS:

Segmentation: Normal.

Alignment: Degenerative scoliosis convex RIGHT approximately 35
degrees, centered at L2. 3 mm anterolisthesis L5-S1.

Vertebrae: No worrisome osseous lesion.

Conus medullaris: Normal in size and location.

Paraspinal tissues: Aortoiliac atherosclerosis. Nonobstructing
BILATERAL nephrolithiasis. No hydronephrosis. Non worrisome
perinephric stranding.

Disc levels:

T12-L1: Central and LEFT extrusion, partially calcified, with
associated foraminal narrowing. LEFT T12 and LEFT L1 nerve root
impingement.

L1-L2: Central and leftward extrusion, partially calcified.
Asymmetric loss of interspace height on the LEFT with osseous
spurring. Asymmetric LEFT facet arthropathy. Mild stenosis. LEFT L1
and LEFT L2 nerve root impingement.

L2-L3: Central and leftward protrusion, partially calcified.
Asymmetric loss of interspace height on the LEFT, with osseous
spurring. Asymmetric LEFT facet arthropathy. Mild to moderate
stenosis. LEFT L2 and LEFT L3 nerve root impingement.

L3-L4: Shallow central protrusion, partially calcified. Disc space
narrowing. Rightward translation L3 on L4, of 7 mm. Posterior
element hypertrophy affecting facets and ligamentum flavum. Moderate
to severe stenosis. BILATERAL L4 nerve root impingement. RIGHT
greater than LEFT foraminal narrowing could affect either L3 nerve
root.

L4-L5: Central and rightward protrusion. Asymmetric loss of
interspace height with osseous spurring to the RIGHT. RIGHT greater
than LEFT facet arthropathy. Mild stenosis. RIGHT L4 and possibly
RIGHT L5 nerve root impingement.

L5-S1: 3 mm of facet mediated slip. Annular bulge. Facet
arthropathy. No impingement.
IMPRESSION: LUMBAR MYELOGRAM IMPRESSION:

Severe degenerative scoliosis of 35 degrees convex RIGHT centered at
L2. Potentially symptomatic LEFT-sided neural impingement at T12-L1,
L1-2, L2-3, and L3-4.

3 mm anterolisthesis L5-S1 without dynamic instability.

CT LUMBAR MYELOGRAM IMPRESSION:

Central and leftward disc extrusions/protrusions at T12-L1, L1-2,
and L2-3 as described above. Asymmetric LEFT-sided loss of
interspace height in combination with bony overgrowth and facet
arthropathy contribute to LEFT-sided subarticular zone and foraminal
zone narrowing at these levels.

Moderate to severe stenosis at L3-4 is multifactorial, related to
shallow central protrusion, disc space narrowing, rightward
translation of L3 on L4, posterior element hypertrophy, and bony
overgrowth. Moderate to severe stenosis with BILATERAL L4 nerve root
impingement. RIGHT greater than LEFT foraminal narrowing could
affect either L3 nerve root at this level.

3 mm slip L5-S1 is noncompressive.

Aortic Atherosclerosis ([id number]).

## 2018-09-29 HISTORY — PX: TRANSTHORACIC ECHOCARDIOGRAM: SHX275

## 2018-10-09 ENCOUNTER — Inpatient Hospital Stay (HOSPITAL_COMMUNITY)
Admission: EM | Admit: 2018-10-09 | Discharge: 2018-10-13 | DRG: 070 | Disposition: A | Discharge status: Home Health | Payer: Medicare Other | Attending: Internal Medicine | Admitting: Internal Medicine

## 2018-10-09 ENCOUNTER — Emergency Department (HOSPITAL_COMMUNITY): Payer: Medicare Other

## 2018-10-09 ENCOUNTER — Encounter (HOSPITAL_COMMUNITY): Payer: Self-pay

## 2018-10-09 DIAGNOSIS — Z9114 Patient's other noncompliance with medication regimen: Secondary | ICD-10-CM

## 2018-10-09 DIAGNOSIS — K761 Chronic passive congestion of liver: Secondary | ICD-10-CM | POA: Diagnosis present

## 2018-10-09 DIAGNOSIS — R4182 Altered mental status, unspecified: Secondary | ICD-10-CM | POA: Diagnosis not present

## 2018-10-09 DIAGNOSIS — Z96652 Presence of left artificial knee joint: Secondary | ICD-10-CM | POA: Diagnosis present

## 2018-10-09 DIAGNOSIS — E119 Type 2 diabetes mellitus without complications: Secondary | ICD-10-CM | POA: Diagnosis present

## 2018-10-09 DIAGNOSIS — I130 Hypertensive heart and chronic kidney disease with heart failure and stage 1 through stage 4 chronic kidney disease, or unspecified chronic kidney disease: Secondary | ICD-10-CM | POA: Diagnosis present

## 2018-10-09 DIAGNOSIS — E785 Hyperlipidemia, unspecified: Secondary | ICD-10-CM | POA: Diagnosis present

## 2018-10-09 DIAGNOSIS — Z7984 Long term (current) use of oral hypoglycemic drugs: Secondary | ICD-10-CM

## 2018-10-09 DIAGNOSIS — R509 Fever, unspecified: Secondary | ICD-10-CM | POA: Diagnosis present

## 2018-10-09 DIAGNOSIS — Y92009 Unspecified place in unspecified non-institutional (private) residence as the place of occurrence of the external cause: Secondary | ICD-10-CM | POA: Diagnosis not present

## 2018-10-09 DIAGNOSIS — N179 Acute kidney failure, unspecified: Secondary | ICD-10-CM | POA: Diagnosis present

## 2018-10-09 DIAGNOSIS — E1122 Type 2 diabetes mellitus with diabetic chronic kidney disease: Secondary | ICD-10-CM | POA: Diagnosis present

## 2018-10-09 DIAGNOSIS — Z8249 Family history of ischemic heart disease and other diseases of the circulatory system: Secondary | ICD-10-CM

## 2018-10-09 DIAGNOSIS — I272 Pulmonary hypertension, unspecified: Secondary | ICD-10-CM | POA: Diagnosis present

## 2018-10-09 DIAGNOSIS — E034 Atrophy of thyroid (acquired): Secondary | ICD-10-CM

## 2018-10-09 DIAGNOSIS — E1142 Type 2 diabetes mellitus with diabetic polyneuropathy: Secondary | ICD-10-CM | POA: Diagnosis present

## 2018-10-09 DIAGNOSIS — R0602 Shortness of breath: Secondary | ICD-10-CM | POA: Diagnosis not present

## 2018-10-09 DIAGNOSIS — G9340 Encephalopathy, unspecified: Secondary | ICD-10-CM | POA: Diagnosis not present

## 2018-10-09 DIAGNOSIS — D696 Thrombocytopenia, unspecified: Secondary | ICD-10-CM | POA: Diagnosis present

## 2018-10-09 DIAGNOSIS — I110 Hypertensive heart disease with heart failure: Secondary | ICD-10-CM | POA: Diagnosis present

## 2018-10-09 DIAGNOSIS — R7989 Other specified abnormal findings of blood chemistry: Secondary | ICD-10-CM

## 2018-10-09 DIAGNOSIS — Z049 Encounter for examination and observation for unspecified reason: Secondary | ICD-10-CM | POA: Diagnosis not present

## 2018-10-09 DIAGNOSIS — E872 Acidosis: Secondary | ICD-10-CM | POA: Diagnosis present

## 2018-10-09 DIAGNOSIS — G9341 Metabolic encephalopathy: Secondary | ICD-10-CM | POA: Diagnosis not present

## 2018-10-09 DIAGNOSIS — R000 Tachycardia, unspecified: Secondary | ICD-10-CM | POA: Diagnosis not present

## 2018-10-09 DIAGNOSIS — W19XXXA Unspecified fall, initial encounter: Secondary | ICD-10-CM | POA: Diagnosis present

## 2018-10-09 DIAGNOSIS — E039 Hypothyroidism, unspecified: Secondary | ICD-10-CM | POA: Diagnosis present

## 2018-10-09 DIAGNOSIS — R140 Abdominal distension (gaseous): Secondary | ICD-10-CM

## 2018-10-09 DIAGNOSIS — I5033 Acute on chronic diastolic (congestive) heart failure: Secondary | ICD-10-CM | POA: Diagnosis present

## 2018-10-09 DIAGNOSIS — I370 Nonrheumatic pulmonary valve stenosis: Secondary | ICD-10-CM | POA: Diagnosis not present

## 2018-10-09 DIAGNOSIS — N182 Chronic kidney disease, stage 2 (mild): Secondary | ICD-10-CM | POA: Diagnosis present

## 2018-10-09 DIAGNOSIS — E1140 Type 2 diabetes mellitus with diabetic neuropathy, unspecified: Secondary | ICD-10-CM | POA: Diagnosis present

## 2018-10-09 DIAGNOSIS — R6510 Systemic inflammatory response syndrome (SIRS) of non-infectious origin without acute organ dysfunction: Secondary | ICD-10-CM | POA: Diagnosis present

## 2018-10-09 DIAGNOSIS — M25551 Pain in right hip: Secondary | ICD-10-CM | POA: Diagnosis not present

## 2018-10-09 DIAGNOSIS — Z79899 Other long term (current) drug therapy: Secondary | ICD-10-CM

## 2018-10-09 DIAGNOSIS — I10 Essential (primary) hypertension: Secondary | ICD-10-CM

## 2018-10-09 DIAGNOSIS — S199XXA Unspecified injury of neck, initial encounter: Secondary | ICD-10-CM | POA: Diagnosis not present

## 2018-10-09 DIAGNOSIS — Z8349 Family history of other endocrine, nutritional and metabolic diseases: Secondary | ICD-10-CM

## 2018-10-09 DIAGNOSIS — Z6839 Body mass index (BMI) 39.0-39.9, adult: Secondary | ICD-10-CM

## 2018-10-09 DIAGNOSIS — Z882 Allergy status to sulfonamides status: Secondary | ICD-10-CM | POA: Diagnosis not present

## 2018-10-09 DIAGNOSIS — E782 Mixed hyperlipidemia: Secondary | ICD-10-CM

## 2018-10-09 DIAGNOSIS — E1159 Type 2 diabetes mellitus with other circulatory complications: Secondary | ICD-10-CM | POA: Diagnosis present

## 2018-10-09 DIAGNOSIS — A419 Sepsis, unspecified organism: Secondary | ICD-10-CM | POA: Diagnosis present

## 2018-10-09 DIAGNOSIS — J9601 Acute respiratory failure with hypoxia: Secondary | ICD-10-CM | POA: Diagnosis present

## 2018-10-09 DIAGNOSIS — D631 Anemia in chronic kidney disease: Secondary | ICD-10-CM | POA: Diagnosis present

## 2018-10-09 DIAGNOSIS — Z8261 Family history of arthritis: Secondary | ICD-10-CM

## 2018-10-09 DIAGNOSIS — R6520 Severe sepsis without septic shock: Secondary | ICD-10-CM | POA: Diagnosis not present

## 2018-10-09 DIAGNOSIS — Z7901 Long term (current) use of anticoagulants: Secondary | ICD-10-CM

## 2018-10-09 DIAGNOSIS — Z880 Allergy status to penicillin: Secondary | ICD-10-CM

## 2018-10-09 DIAGNOSIS — Z8542 Personal history of malignant neoplasm of other parts of uterus: Secondary | ICD-10-CM

## 2018-10-09 DIAGNOSIS — I361 Nonrheumatic tricuspid (valve) insufficiency: Secondary | ICD-10-CM | POA: Diagnosis not present

## 2018-10-09 DIAGNOSIS — R945 Abnormal results of liver function studies: Secondary | ICD-10-CM

## 2018-10-09 DIAGNOSIS — Z888 Allergy status to other drugs, medicaments and biological substances status: Secondary | ICD-10-CM

## 2018-10-09 DIAGNOSIS — I4821 Permanent atrial fibrillation: Secondary | ICD-10-CM | POA: Diagnosis present

## 2018-10-09 DIAGNOSIS — R0989 Other specified symptoms and signs involving the circulatory and respiratory systems: Secondary | ICD-10-CM | POA: Diagnosis not present

## 2018-10-09 DIAGNOSIS — Z90710 Acquired absence of both cervix and uterus: Secondary | ICD-10-CM

## 2018-10-09 DIAGNOSIS — Z79890 Hormone replacement therapy (postmenopausal): Secondary | ICD-10-CM

## 2018-10-09 DIAGNOSIS — I4819 Other persistent atrial fibrillation: Secondary | ICD-10-CM | POA: Diagnosis present

## 2018-10-09 DIAGNOSIS — Z803 Family history of malignant neoplasm of breast: Secondary | ICD-10-CM

## 2018-10-09 DIAGNOSIS — I700 Atherosclerosis of aorta: Secondary | ICD-10-CM | POA: Diagnosis present

## 2018-10-09 DIAGNOSIS — S79911A Unspecified injury of right hip, initial encounter: Secondary | ICD-10-CM | POA: Diagnosis not present

## 2018-10-09 DIAGNOSIS — S0990XA Unspecified injury of head, initial encounter: Secondary | ICD-10-CM | POA: Diagnosis not present

## 2018-10-09 DIAGNOSIS — I152 Hypertension secondary to endocrine disorders: Secondary | ICD-10-CM | POA: Diagnosis present

## 2018-10-09 DIAGNOSIS — Z87442 Personal history of urinary calculi: Secondary | ICD-10-CM

## 2018-10-09 DIAGNOSIS — Z96641 Presence of right artificial hip joint: Secondary | ICD-10-CM | POA: Diagnosis present

## 2018-10-09 DIAGNOSIS — Z90722 Acquired absence of ovaries, bilateral: Secondary | ICD-10-CM

## 2018-10-09 DIAGNOSIS — E1169 Type 2 diabetes mellitus with other specified complication: Secondary | ICD-10-CM | POA: Diagnosis present

## 2018-10-09 DIAGNOSIS — R0789 Other chest pain: Secondary | ICD-10-CM | POA: Diagnosis present

## 2018-10-09 DIAGNOSIS — R319 Hematuria, unspecified: Secondary | ICD-10-CM | POA: Diagnosis present

## 2018-10-09 LAB — CBC WITH DIFFERENTIAL/PLATELET
Abs Immature Granulocytes: 0.04 10*3/uL (ref 0.00–0.07)
BASOS ABS: 0 10*3/uL (ref 0.0–0.1)
Basophils Relative: 0 %
EOS ABS: 0 10*3/uL (ref 0.0–0.5)
EOS PCT: 0 %
HEMATOCRIT: 38.8 % (ref 36.0–46.0)
HEMOGLOBIN: 12.3 g/dL (ref 12.0–15.0)
Immature Granulocytes: 1 %
LYMPHS ABS: 0.4 10*3/uL — AB (ref 0.7–4.0)
Lymphocytes Relative: 7 %
MCH: 31.4 pg (ref 26.0–34.0)
MCHC: 31.7 g/dL (ref 30.0–36.0)
MCV: 99 fL (ref 80.0–100.0)
MONO ABS: 0.4 10*3/uL (ref 0.1–1.0)
Monocytes Relative: 7 %
Neutro Abs: 5.4 10*3/uL (ref 1.7–7.7)
Neutrophils Relative %: 85 %
Platelets: 111 10*3/uL — ABNORMAL LOW (ref 150–400)
RBC: 3.92 MIL/uL (ref 3.87–5.11)
RDW: 13.6 % (ref 11.5–15.5)
WBC: 6.3 10*3/uL (ref 4.0–10.5)
nRBC: 0 % (ref 0.0–0.2)

## 2018-10-09 LAB — COMPREHENSIVE METABOLIC PANEL
ALBUMIN: 3.9 g/dL (ref 3.5–5.0)
ALT: 23 U/L (ref 0–44)
ANION GAP: 11 (ref 5–15)
AST: 36 U/L (ref 15–41)
Alkaline Phosphatase: 51 U/L (ref 38–126)
BILIRUBIN TOTAL: 1.6 mg/dL — AB (ref 0.3–1.2)
BUN: 12 mg/dL (ref 8–23)
CALCIUM: 8.4 mg/dL — AB (ref 8.9–10.3)
CHLORIDE: 101 mmol/L (ref 98–111)
CO2: 24 mmol/L (ref 22–32)
Creatinine, Ser: 1.11 mg/dL — ABNORMAL HIGH (ref 0.44–1.00)
GFR calc Af Amer: 51 mL/min — ABNORMAL LOW (ref >60)
GFR, EST NON AFRICAN AMERICAN: 44 mL/min — AB (ref >60)
GLUCOSE: 231 mg/dL — AB (ref 70–99)
POTASSIUM: 4.1 mmol/L (ref 3.5–5.1)
Sodium: 136 mmol/L (ref 135–145)
TOTAL PROTEIN: 7 g/dL (ref 6.5–8.1)

## 2018-10-09 LAB — I-STAT CG4 LACTIC ACID, ED
LACTIC ACID, VENOUS: 2.24 mmol/L — AB (ref 0.5–1.9)
LACTIC ACID, VENOUS: 1.59 mmol/L (ref 0.5–1.9)

## 2018-10-09 LAB — URINALYSIS, ROUTINE W REFLEX MICROSCOPIC
BILIRUBIN URINE: NEGATIVE
Bacteria, UA: NONE SEEN
GLUCOSE, UA: 50 mg/dL — AB
KETONES UR: 5 mg/dL — AB
LEUKOCYTES UA: NEGATIVE
NITRITE: NEGATIVE
PROTEIN: 30 mg/dL — AB
Specific Gravity, Urine: 1.019 (ref 1.005–1.030)
pH: 6 (ref 5.0–8.0)

## 2018-10-09 LAB — CBG MONITORING, ED: Glucose-Capillary: 204 mg/dL — ABNORMAL HIGH (ref 70–99)

## 2018-10-09 LAB — PROTIME-INR
INR: 1.38
Prothrombin Time: 16.8 seconds — ABNORMAL HIGH (ref 11.4–15.2)

## 2018-10-09 LAB — INFLUENZA PANEL BY PCR (TYPE A & B)
Influenza A By PCR: NEGATIVE
Influenza B By PCR: NEGATIVE

## 2018-10-09 LAB — TROPONIN I

## 2018-10-09 LAB — BRAIN NATRIURETIC PEPTIDE: B Natriuretic Peptide: 357.2 pg/mL — ABNORMAL HIGH (ref 0.0–100.0)

## 2018-10-09 MED ORDER — SODIUM CHLORIDE 0.9 % IV SOLN
100.0000 mL/h | INTRAVENOUS | Status: AC
  Administered 2018-10-09: 100 mL/h via INTRAVENOUS

## 2018-10-09 MED ORDER — MORPHINE SULFATE (PF) 2 MG/ML IV SOLN
2.0000 mg | INTRAVENOUS | Status: DC | PRN
  Administered 2018-10-09 – 2018-10-10 (×2): 2 mg via INTRAVENOUS
  Filled 2018-10-09 (×2): qty 1

## 2018-10-09 MED ORDER — ALPRAZOLAM 0.25 MG PO TABS
0.2500 mg | ORAL_TABLET | Freq: Once | ORAL | Status: AC
  Administered 2018-10-09: 0.25 mg via ORAL
  Filled 2018-10-09: qty 1

## 2018-10-09 MED ORDER — SODIUM CHLORIDE 0.9 % IV BOLUS
1000.0000 mL | Freq: Once | INTRAVENOUS | Status: AC
  Administered 2018-10-09: 1000 mL via INTRAVENOUS

## 2018-10-09 MED ORDER — VANCOMYCIN HCL IN DEXTROSE 1-5 GM/200ML-% IV SOLN
1000.0000 mg | Freq: Once | INTRAVENOUS | Status: AC
  Administered 2018-10-09: 1000 mg via INTRAVENOUS
  Filled 2018-10-09: qty 200

## 2018-10-09 MED ORDER — SODIUM CHLORIDE 0.9 % IV SOLN
2.0000 g | Freq: Three times a day (TID) | INTRAVENOUS | Status: DC
  Administered 2018-10-10 (×2): 2 g via INTRAVENOUS
  Filled 2018-10-09 (×3): qty 2

## 2018-10-09 MED ORDER — LEVALBUTEROL HCL 0.63 MG/3ML IN NEBU: 0.6300 mg | INHALATION_SOLUTION | Freq: Four times a day (QID) | RESPIRATORY_TRACT | Status: DC | PRN

## 2018-10-09 MED ORDER — GABAPENTIN 300 MG PO CAPS
300.0000 mg | ORAL_CAPSULE | Freq: Two times a day (BID) | ORAL | Status: DC
  Administered 2018-10-10 – 2018-10-13 (×7): 300 mg via ORAL
  Filled 2018-10-09 (×7): qty 1

## 2018-10-09 MED ORDER — SODIUM CHLORIDE 0.9% FLUSH
3.0000 mL | Freq: Two times a day (BID) | INTRAVENOUS | Status: DC
  Administered 2018-10-10 – 2018-10-13 (×5): 3 mL via INTRAVENOUS

## 2018-10-09 MED ORDER — ONDANSETRON HCL 4 MG/2ML IJ SOLN
4.0000 mg | Freq: Four times a day (QID) | INTRAMUSCULAR | Status: DC | PRN
  Administered 2018-10-10: 4 mg via INTRAVENOUS
  Filled 2018-10-09: qty 2

## 2018-10-09 MED ORDER — BISACODYL 10 MG RE SUPP: 10.0000 mg | Freq: Every day | RECTAL | Status: DC | PRN

## 2018-10-09 MED ORDER — INSULIN ASPART 100 UNIT/ML ~~LOC~~ SOLN
0.0000 [IU] | Freq: Three times a day (TID) | SUBCUTANEOUS | Status: DC
  Administered 2018-10-10: 3 [IU] via SUBCUTANEOUS
  Administered 2018-10-10 (×2): 8 [IU] via SUBCUTANEOUS
  Administered 2018-10-11: 3 [IU] via SUBCUTANEOUS
  Administered 2018-10-11: 5 [IU] via SUBCUTANEOUS
  Administered 2018-10-11 – 2018-10-13 (×6): 3 [IU] via SUBCUTANEOUS
  Filled 2018-10-09 (×2): qty 1

## 2018-10-09 MED ORDER — SODIUM CHLORIDE 0.9 % IV SOLN
500.0000 mg | INTRAVENOUS | Status: DC
  Administered 2018-10-09: 500 mg via INTRAVENOUS
  Filled 2018-10-09: qty 500

## 2018-10-09 MED ORDER — ONDANSETRON HCL 4 MG PO TABS: 4.0000 mg | ORAL_TABLET | Freq: Four times a day (QID) | ORAL | Status: DC | PRN

## 2018-10-09 MED ORDER — INSULIN ASPART 100 UNIT/ML ~~LOC~~ SOLN
0.0000 [IU] | Freq: Every day | SUBCUTANEOUS | Status: DC
  Administered 2018-10-10 – 2018-10-11 (×2): 2 [IU] via SUBCUTANEOUS
  Filled 2018-10-09: qty 1

## 2018-10-09 MED ORDER — IPRATROPIUM-ALBUTEROL 0.5-2.5 (3) MG/3ML IN SOLN
3.0000 mL | Freq: Four times a day (QID) | RESPIRATORY_TRACT | Status: DC
  Administered 2018-10-09: 3 mL via RESPIRATORY_TRACT
  Filled 2018-10-09 (×3): qty 3

## 2018-10-09 MED ORDER — RIVAROXABAN 20 MG PO TABS
20.0000 mg | ORAL_TABLET | Freq: Every day | ORAL | Status: DC
  Administered 2018-10-10 – 2018-10-13 (×4): 20 mg via ORAL
  Filled 2018-10-09 (×4): qty 1

## 2018-10-09 MED ORDER — SODIUM CHLORIDE 0.9 % IV SOLN
2.0000 g | Freq: Once | INTRAVENOUS | Status: AC
  Administered 2018-10-09: 2 g via INTRAVENOUS
  Filled 2018-10-09: qty 2

## 2018-10-09 MED ORDER — ONDANSETRON HCL 4 MG/2ML IJ SOLN: 4.0000 mg | Freq: Four times a day (QID) | INTRAMUSCULAR | Status: DC | PRN

## 2018-10-09 MED ORDER — HYDROCODONE-ACETAMINOPHEN 5-325 MG PO TABS: 1.0000 | ORAL_TABLET | ORAL | Status: DC | PRN

## 2018-10-09 MED ORDER — ACETAMINOPHEN 650 MG RE SUPP: 650.0000 mg | Freq: Four times a day (QID) | RECTAL | Status: DC | PRN

## 2018-10-09 MED ORDER — ACETAMINOPHEN 325 MG PO TABS
650.0000 mg | ORAL_TABLET | Freq: Once | ORAL | Status: AC
  Administered 2018-10-09: 650 mg via ORAL
  Filled 2018-10-09: qty 2

## 2018-10-09 MED ORDER — VANCOMYCIN HCL IN DEXTROSE 1-5 GM/200ML-% IV SOLN: 1000.0000 mg | INTRAVENOUS | Status: DC

## 2018-10-09 MED ORDER — ACETAMINOPHEN 325 MG PO TABS
650.0000 mg | ORAL_TABLET | Freq: Four times a day (QID) | ORAL | Status: DC | PRN
  Administered 2018-10-11 – 2018-10-13 (×2): 650 mg via ORAL
  Filled 2018-10-09 (×2): qty 2

## 2018-10-09 MED ORDER — ACETAMINOPHEN 325 MG PO TABS: 650.0000 mg | ORAL_TABLET | Freq: Four times a day (QID) | ORAL | Status: DC | PRN

## 2018-10-09 MED ORDER — VANCOMYCIN HCL IN DEXTROSE 1-5 GM/200ML-% IV SOLN: 1000.0000 mg | Freq: Two times a day (BID) | INTRAVENOUS | Status: DC

## 2018-10-09 MED ORDER — PREDNISONE 20 MG PO TABS
40.0000 mg | ORAL_TABLET | Freq: Every day | ORAL | Status: DC
  Administered 2018-10-10: 40 mg via ORAL
  Filled 2018-10-09: qty 2

## 2018-10-09 MED ORDER — LEVALBUTEROL HCL 0.63 MG/3ML IN NEBU
0.6300 mg | INHALATION_SOLUTION | RESPIRATORY_TRACT | Status: DC | PRN
  Filled 2018-10-09: qty 3

## 2018-10-09 MED ORDER — LEVOTHYROXINE SODIUM 75 MCG PO TABS
175.0000 ug | ORAL_TABLET | Freq: Every day | ORAL | Status: DC
  Administered 2018-10-10 – 2018-10-13 (×4): 175 ug via ORAL
  Filled 2018-10-09 (×4): qty 1

## 2018-10-09 NOTE — ED Notes (Signed)
Bed: OV70 Expected date:  Expected time:  Means of arrival:  Comments: EMS sepsis 84

## 2018-10-09 NOTE — ED Notes (Signed)
Patient transported to CT 

## 2018-10-09 NOTE — H&P (Addendum)
Debra Campbell:[id number] DOB: Apr 12, 1932 DOA: 10/09/2018     PCP: Chevis Pretty, FNP   Outpatient Specialists:   CARDS: [facility] endometrial cancer    Patient arrived to ER on 10/09/18 at 1447  Patient coming from: home Lives  With family   Chief Complaint:  Chief Complaint  Patient presents with  . possible uti  . Fever  . Fall    HPI: Debra Campbell is a 84 y.o. female with medical history significant of DM 2,  Hip edema, persistent atrial fibrillation, hypothyroidism, diabetic polyneuropathy, morbid obesity.   Presented with fall today patient originally fell in the morning EMS was called she refused transfer.  Few hours later family called in because patient was disoriented Patient is a low-grade fever 100.4 heart rate 100 satting 91% on room air she was started on 2 L and improved to 90% EMS gave 900 ml  NS  Regarding pertinent Chronic problems:  A.fib on anticoagulation  Stage IB grade 1 endometrial adenocarcinoma (July 2014). Clinically free of disease sp. total abdominal hysterectomy bilateral salpingo-oophorectomy and pelvic lymphadenectomy on 06/26/2013. Last echo 2017 preserved EF 3 of diabetes but does not check her blood sugar at home last HGBA1c was 6.8% While in ER: Came more febrile with temperature up to 103.2 likely  sepsis started on IV antibiotics including vancomycin and aztreonam Initial lactic acid 2.24 after IV fluids down to 1.59 UA showing hematuria but no evidence of UTI The following Work up has been ordered so far:  Orders Placed This Encounter  Procedures  . Culture, blood (Routine x 2)  . Urine culture  . DG Chest 2 View  . CT Head Wo Contrast  . CT Cervical Spine Wo Contrast  . DG Hip Unilat W or Wo Pelvis 2-3 Views Right  . Comprehensive metabolic panel  . CBC with Differential  . Protime-INR  . Urinalysis, Routine w reflex microscopic  . Troponin I - Once  . Brain natriuretic peptide  . Influenza panel  by PCR (type A & B)  . Diet NPO time specified  . Notify Physician if pt is possible Sepsis patient  . Document Actual / Estimated Weight  . Insert / maintain saline lock  . In and Out Cath  . Cardiac monitoring  . Refer to Sidebar Report for: Sepsis Bundle ED/IP  . Document vital signs within 1-hour of fluid bolus completion and notify provider of bolus completion  . Insert peripheral IV x 2  . Initiate Carrier Fluid Protocol  . Vital signs  . Call Code Sepsis ([facility] [phone]) Reason for Consult? tracking  . Consult to hospitalist  . Pulse oximetry, continuous  . I-Stat CG4 Lactic Acid, ED  . EKG 12-Lead     Following Medications were ordered in ER: Medications  sodium chloride 0.9 % bolus 1,000 mL (0 mLs Intravenous Stopped 10/09/18 1718)  acetaminophen (TYLENOL) tablet 650 mg (650 mg Oral Given 10/09/18 1619)  vancomycin (VANCOCIN) IVPB 1000 mg/200 mL premix (0 mg Intravenous Stopped 10/09/18 1739)  aztreonam (AZACTAM) 2 g in sodium chloride 0.9 % 100 mL IVPB (2 g Intravenous New Bag/Given 10/09/18 1741)    Significant initial  Findings: Abnormal Labs Reviewed  COMPREHENSIVE METABOLIC PANEL - Abnormal; Notable for the following components:      Result Value   Glucose, Bld 231 (*)    Creatinine, Ser 1.11 (*)    Calcium 8.4 (*)    Total Bilirubin 1.6 (*)    GFR calc non  Af Amer 44 (*)    GFR calc Af Amer 51 (*)    All other components within normal limits  CBC WITH DIFFERENTIAL/PLATELET - Abnormal; Notable for the following components:   Platelets 111 (*)    All other components within normal limits  PROTIME-INR - Abnormal; Notable for the following components:   Prothrombin Time 16.8 (*)    All other components within normal limits  URINALYSIS, ROUTINE W REFLEX MICROSCOPIC - Abnormal; Notable for the following components:   Glucose, UA 50 (*)    Hgb urine dipstick MODERATE (*)    Ketones, ur 5 (*)    Protein, ur 30 (*)    All other components within normal  limits  BRAIN NATRIURETIC PEPTIDE - Abnormal; Notable for the following components:   B Natriuretic Peptide 357.2 (*)    All other components within normal limits  I-STAT CG4 LACTIC ACID, ED - Abnormal; Notable for the following components:   Lactic Acid, Venous 2.24 (*)    All other components within normal limits     Na 136 K 4.1  Cr  Up from baseline see below Lab Results  Component Value Date   CREATININE 1.11 (H) 10/09/2018   CREATININE 0.99 07/25/2018   CREATININE 0.98 04/13/2018    BNP 357.2  WBC  6.3  HG/HCT  stable,     Component Value Date/Time   HGB 12.3 10/09/2018 1455   HCT 38.8 10/09/2018 1455    Troponin (Point of Care Test) No results for input(s): TROPIPOC in the last 72 hours.   BNP (last 3 results) Recent Labs    10/09/18 1502  BNP 357.2*    ProBNP (last 3 results) No results for input(s): PROBNP in the last 8760 hours.  Lactic Acid, Venous    Component Value Date/Time   LATICACIDVEN 1.59 10/09/2018 1755   INR 1.38   UA hematuria  CT HEAD/neck  NON acute Right Hip non acute CXR - cardiomegaly   ECG:  Personally reviewed by me showing: HR : 103 Rhythm:   A.fib. W RVR,  nonspecific changes,  QTC 565     ED Triage Vitals  Enc Vitals Group     BP 10/09/18 1501 (!) 134/110     Pulse Rate 10/09/18 1501 95     Resp 10/09/18 1501 20     Temp 10/09/18 1457 (!) 103.2 F (39.6 C)     Temp Source 10/09/18 1457 Rectal     SpO2 10/09/18 1501 91 %     Weight --      Height --      Head Circumference --      Peak Flow --      Pain Score --      Pain Loc --      Pain Edu? --      Excl. in [place]? --   TMAX(24)@       Latest  Blood pressure 111/66, pulse 85, temperature (!) 100.7 F (38.2 C), temperature source Rectal, resp. rate (!) 22, SpO2 98 %.   Hospitalist was called for admission for SIRS   Review of Systems:    Pertinent positives include: Fevers, chills, fatigue, fall confusion Dizziness, non-productive  cough,wheezing. Constitutional:  No weight loss, night sweats,  weight loss  HEENT:  No headaches, Difficulty swallowing,Tooth/dental problems,Sore throat,  No sneezing, itching, ear ache, nasal congestion, post nasal drip,  Cardio-vascular:  No chest pain, Orthopnea, PND, anasarca,  palpitations.no Bilateral lower extremity swelling  GI:  No heartburn,  indigestion, abdominal pain, nausea, vomiting, diarrhea, change in bowel habits, loss of appetite, melena, blood in stool, hematemesis Resp:  no shortness of breath at rest. No dyspnea on exertion, No excess mucus, no productive cough, No  No coughing up of blood.No change in color of mucus.  Skin:  no rash or lesions. No jaundice GU:  no dysuria, change in color of urine, no urgency or frequency. No straining to urinate.  No flank pain.  Musculoskeletal:  No joint pain or no joint swelling. No decreased range of motion. No back pain.  Psych:  No change in mood or affect. No depression or anxiety. No memory loss.  Neuro: no localizing neurological complaints, no tingling, no weakness, no double vision, no gait abnormality, no slurred speech, no confusion  All systems reviewed and apart from [place] all are negative  Past Medical History:   Past Medical History:  Diagnosis Date  . Aortic calcification ([place]) 2013   Atherosclerotic calcifications of the abdominal aorta and branch noted on CT Abd/Pelvis  . Arthritis    legs, lower back, hands  . Cancer ([place])    endometrial  . Diabetes mellitus   . Ectopic pregnancy   . Eczema   . Headache(784.0)    hx of  . History of kidney stones   . Hyperlipidemia    no meds  . Hypertension   . Hypothyroidism   . Persistent atrial fibrillation 09/28/2016   Relatively new Dx: Asymptomatic.  Rate controlled without medication.  . SVD (spontaneous vaginal delivery)    x 3  . Thyroid disease      Past Surgical History:  Procedure Laterality Date  . ABDOMINAL HYSTERECTOMY Bilateral  06/26/2013   Procedure: TOTAL ABDOMINAL HYSTERECTOMY WITH BILATERAL SALPINGO OOPHERECTOMY WITH PELVIC LYMPHADNECTOMY;  Surgeon: Alvino Chapel, MD;  Location: [facility];  Service: Gynecology;  Laterality: Bilateral;  . BACK SURGERY  06/26/2013  . ECTOPIC PREGNANCY SURGERY    . HERNIA REPAIR    . HYSTEROSCOPY W/D&C N/A 04/26/2013   Procedure: DILATATION AND CURETTAGE /HYSTEROSCOPY;  Surgeon: Woodroe Mode, MD;  Location: [facility];  Service: Gynecology;  Laterality: N/A;  . JOINT REPLACEMENT    . LAPAROTOMY     for ectopic pregnancy  . LAPAROTOMY N/A 06/26/2013   Procedure: EXPLORATORY LAPAROTOMY;  Surgeon: Alvino Chapel, MD;  Location: [facility];  Service: Gynecology;  Laterality: N/A;  . lt knee arthroscopy    . rt total hip replacement    . TONSILLECTOMY    . TOTAL KNEE ARTHROPLASTY Left 12/22/2016   Procedure: LEFT TOTAL KNEE ARTHROPLASTY;  Surgeon: Latanya Maudlin, MD;  Location: [facility];  Service: Orthopedics;  Laterality: Left;  . WISDOM TOOTH EXTRACTION      Social History:  Ambulatory   walker       reports that she has never smoked. She has never used smokeless tobacco. She reports that she does not drink alcohol or use drugs.     Family History:   Family History  Problem Relation Age of Onset  . Heart disease Father   . Thyroid disease Father   . Heart attack Father   . Heart disease Sister        open heart surgery  . Cancer Sister        breast  . Neuropathy Sister        secondary to cancer treatment  . Breast cancer Sister   . Suicidality Brother 49  . Arthritis Mother   . Cancer Daughter   .  Breast cancer Daughter     Allergies: Allergies  Allergen Reactions  . Ace Inhibitors Cough  . Iohexol      Desc: HIVES 40 YEARS AGO   . Statins     myalgia  . Penicillins Swelling and Rash    Has patient had a PCN reaction causing immediate rash, facial/tongue/throat swelling, SOB or lightheadedness with hypotension: Yes Has patient had a PCN  reaction causing severe rash involving mucus membranes or skin necrosis: No Has patient had a PCN reaction that required hospitalization Yes Has patient had a PCN reaction occurring within the last 10 years: No If all of the above answers are "NO", then may proceed with Cephalosporin use.   . Sulfa Antibiotics Rash     Prior to Admission medications   Medication Sig Start Date End Date Taking? Authorizing Provider  acetaminophen (TYLENOL) 500 MG tablet Take 1,000 mg by mouth 2 (two) times daily.    Yes [provider]  furosemide (LASIX) 40 MG tablet Take 1 tablet (40 mg total) by mouth 2 (two) times daily. 07/25/18  Yes Martin, [name], FNP  gabapentin (NEURONTIN) 300 MG capsule Take 1 capsule (300 mg total) by mouth 2 (two) times daily. 07/25/18  Yes Hassell Done, [name], FNP  levothyroxine (SYNTHROID, LEVOTHROID) 175 MCG tablet Take 1 tablet (175 mcg total) by mouth daily before breakfast. 07/25/18  Yes Hassell Done, [name], FNP  losartan (COZAAR) 50 MG tablet Take 1 tablet (50 mg total) by mouth daily. 07/25/18  Yes Hassell Done, [name], FNP  metFORMIN (GLUCOPHAGE) 1000 MG tablet TAKE 1 TABLET TWICE DAILY WITH A MEAL Patient taking differently: Take 500 mg by mouth 2 (two) times daily with a meal. TAKE 1 TABLET TWICE DAILY WITH A MEAL 07/25/18  Yes Hassell Done, [name], FNP  metoprolol succinate (TOPROL-XL) 25 MG 24 hr tablet Take 1 tablet (25 mg total) by mouth daily. 07/25/18  Yes Hassell Done, [name], FNP  rivaroxaban (XARELTO) 20 MG TABS tablet TAKE 1 TABLET EVERY DAY WITH BREAKFAST 07/25/18  Yes Hassell Done, [name], FNP  Blood Glucose Monitoring Suppl (ACCU-CHEK AVIVA) device Use as instructed 08/11/18 08/11/19  Terald Sleeper, PA-C  clindamycin (CLEOCIN) 150 MG capsule Take 1 capsule (150 mg total) by mouth 3 (three) times daily. Patient not taking: Reported on 10/09/2018 08/09/18   Terald Sleeper, PA-C  glucose blood (ACCU-CHEK AVIVA PLUS) test strip Test 1x per day and  prn. [id number] 08/09/18   Terald Sleeper, PA-C  methocarbamol (ROBAXIN) 500 MG tablet Take 1 tablet (500 mg total) by mouth every 6 (six) hours as needed for muscle spasms. Patient not taking: Reported on 10/09/2018 12/23/16   Ardeen Jourdain, PA-C  nystatin cream (MYCOSTATIN) Apply 1 application topically 2 (two) times daily. 08/09/18   Terald Sleeper, PA-C  triamcinolone cream (KENALOG) 0.5 % Apply 1 application topically 3 (three) times daily as needed. Patient taking differently: Apply 1 application topically 3 (three) times daily as needed (rash).  04/13/18   Chevis Pretty, FNP  TRUEPLUS LANCETS 30G MISC CHECK BLOOD SUGAR ONCE A DAY AND AS NEEDED 03/01/17   Chevis Pretty, FNP   Physical Exam: Blood pressure 111/66, pulse 85, temperature (!) 100.7 F (38.2 C), temperature source Rectal, resp. rate (!) 22, SpO2 98 %. 1. General:  in No Acute distress  Chronically ill -appearing 2. Psychological: Alert and  Oriented 3. Head/ENT:   Dry Mucous Membranes  Head Non traumatic, neck supple                            Poor Dentition 4. SKIN:   decreased Skin turgor,  Skin clean Dry and intact no rash 5. Heart: Regular rate and rhythm no Murmur, no Rub or gallop 6. Lungs:   Wheezes bilateral   No crackles   7. Abdomen: Soft,  non-tender,  distended   obese  bowel sounds present 8. Lower extremities: no clubbing, cyanosis, trace edema 9. Neurologically Grossly intact, moving all 4 extremities equally  10. MSK: Normal range of motion   LABS:     Recent Labs  Lab 10/09/18 1455  WBC 6.3  NEUTROABS PENDING  HGB 12.3  HCT 38.8  MCV 99.0  PLT 010*   Basic Metabolic Panel: Recent Labs  Lab 10/09/18 1455  NA 136  K 4.1  CL 101  CO2 24  GLUCOSE 231*  BUN 12  CREATININE 1.11*  CALCIUM 8.4*      Recent Labs  Lab 10/09/18 1455  AST 36  ALT 23  ALKPHOS 51  BILITOT 1.6*  PROT 7.0  ALBUMIN 3.9   No results for input(s): LIPASE, AMYLASE in  the last 168 hours. No results for input(s): AMMONIA in the last 168 hours.    HbA1C: No results for input(s): HGBA1C in the last 72 hours. CBG: No results for input(s): GLUCAP in the last 168 hours.    Urine analysis:    Component Value Date/Time   COLORURINE YELLOW 10/09/2018 1619   APPEARANCEUR CLEAR 10/09/2018 1619   LABSPEC 1.019 10/09/2018 1619   PHURINE 6.0 10/09/2018 1619   GLUCOSEU 50 (A) 10/09/2018 1619   HGBUR MODERATE (A) 10/09/2018 1619   BILIRUBINUR NEGATIVE 10/09/2018 1619   KETONESUR 5 (A) 10/09/2018 1619   PROTEINUR 30 (A) 10/09/2018 1619   UROBILINOGEN 0.2 02/07/2012 0829   NITRITE NEGATIVE 10/09/2018 1619   LEUKOCYTESUR NEGATIVE 10/09/2018 1619       Cultures: No results found for: SDES, [place], CULT, REPTSTATUS   Radiological Exams on Admission: Dg Chest 2 View  Result Date: 10/09/2018 CLINICAL DATA:  Shortness of breath and possible sepsis. EXAM: CHEST - 2 VIEW COMPARISON:  12/17/2016 FINDINGS: Cardiomegaly and mild pulmonary vascular congestion noted. Mild chronic peribronchial thickening again noted. There is no evidence of focal airspace disease, pulmonary edema, suspicious pulmonary nodule/mass, pleural effusion, or pneumothorax. No acute bony abnormalities are identified. IMPRESSION: Cardiomegaly with mild pulmonary vascular congestion and mild chronic peribronchial thickening. Electronically Signed   By: Margarette Canada M.D.   On: 10/09/2018 16:16   Ct Head Wo Contrast  Result Date: 10/09/2018 CLINICAL DATA:  84 year old female with a history of fall EXAM: CT HEAD WITHOUT CONTRAST CT CERVICAL SPINE WITHOUT CONTRAST TECHNIQUE: Multidetector CT imaging of the head and cervical spine was performed following the standard protocol without intravenous contrast. Multiplanar CT image reconstructions of the cervical spine were also generated. COMPARISON:  None. FINDINGS: CT HEAD FINDINGS Brain: No acute intracranial hemorrhage. No midline shift or mass  effect. Gray-white differentiation maintained. Unremarkable appearance of the ventricular system. Mild volume loss. Vascular: Calcifications of the intracranial vasculature. Skull: No acute fracture.  No aggressive bone lesion identified. Sinuses/Orbits: Unremarkable appearance of the orbits. Mastoid air cells clear. No middle ear effusion. No significant sinus disease. Other: None CT CERVICAL SPINE FINDINGS Alignment: Straightening of the normal cervical lordosis, potentially positional. Anterolisthesis of C3 on C4 measuring 3 mm. Facets  maintain alignment.  Craniocervical junction is aligned. Skull base and vertebrae: No acute fracture. Skull base is aligned. No aggressive bony lesions. Soft tissues and spinal canal: No canal hematoma. Calcifications of the carotid vasculature. No adenopathy. Disc levels: C2-C3: Mild disc disease without significant foraminal narrowing or canal narrowing. Early uncovertebral joint disease. C3-C4: Left greater than right foraminal narrowing secondary to uncovertebral joint disease and facet disease. C4-C5: Bilateral foraminal narrowing secondary to uncovertebral joint disease and facet spurring. C5-C6: Bilateral foraminal narrowing secondary to uncovertebral joint disease and facet disease. C6-C7: Bilateral foraminal narrowing secondary to uncovertebral joint disease and facet disease. C7-T1: No significant canal narrowing. No significant foraminal narrowing. Upper chest: Negative. Other: None IMPRESSION: Head CT: No acute intracranial abnormality. Mild brain volume loss. Intracranial atherosclerosis. Cervical CT: No acute fracture or malalignment of the cervical spine. Multilevel degenerative changes as above. Electronically Signed   By: Corrie Mckusick D.O.   On: 10/09/2018 17:47   Ct Cervical Spine Wo Contrast  Result Date: 10/09/2018 CLINICAL DATA:  84 year old female with a history of fall EXAM: CT HEAD WITHOUT CONTRAST CT CERVICAL SPINE WITHOUT CONTRAST TECHNIQUE:  Multidetector CT imaging of the head and cervical spine was performed following the standard protocol without intravenous contrast. Multiplanar CT image reconstructions of the cervical spine were also generated. COMPARISON:  None. FINDINGS: CT HEAD FINDINGS Brain: No acute intracranial hemorrhage. No midline shift or mass effect. Gray-white differentiation maintained. Unremarkable appearance of the ventricular system. Mild volume loss. Vascular: Calcifications of the intracranial vasculature. Skull: No acute fracture.  No aggressive bone lesion identified. Sinuses/Orbits: Unremarkable appearance of the orbits. Mastoid air cells clear. No middle ear effusion. No significant sinus disease. Other: None CT CERVICAL SPINE FINDINGS Alignment: Straightening of the normal cervical lordosis, potentially positional. Anterolisthesis of C3 on C4 measuring 3 mm. Facets maintain alignment.  Craniocervical junction is aligned. Skull base and vertebrae: No acute fracture. Skull base is aligned. No aggressive bony lesions. Soft tissues and spinal canal: No canal hematoma. Calcifications of the carotid vasculature. No adenopathy. Disc levels: C2-C3: Mild disc disease without significant foraminal narrowing or canal narrowing. Early uncovertebral joint disease. C3-C4: Left greater than right foraminal narrowing secondary to uncovertebral joint disease and facet disease. C4-C5: Bilateral foraminal narrowing secondary to uncovertebral joint disease and facet spurring. C5-C6: Bilateral foraminal narrowing secondary to uncovertebral joint disease and facet disease. C6-C7: Bilateral foraminal narrowing secondary to uncovertebral joint disease and facet disease. C7-T1: No significant canal narrowing. No significant foraminal narrowing. Upper chest: Negative. Other: None IMPRESSION: Head CT: No acute intracranial abnormality. Mild brain volume loss. Intracranial atherosclerosis. Cervical CT: No acute fracture or malalignment of the cervical  spine. Multilevel degenerative changes as above. Electronically Signed   By: Corrie Mckusick D.O.   On: 10/09/2018 17:47   Dg Hip Unilat W Or Wo Pelvis 2-3 Views Right  Result Date: 10/09/2018 CLINICAL DATA:  Pelvic and RIGHT hip pain, fell earlier today EXAM: DG HIP (WITH OR WITHOUT PELVIS) 2-3V RIGHT COMPARISON:  No 03/02/2007 FINDINGS: Osseous demineralization. RIGHT hip prosthesis. Degenerative changes LEFT hip joint with significant joint space narrowing. Degenerative disc and facet disease changes at visualized lower lumbar spine. No acute fracture, dislocation, or bone destruction. IMPRESSION: RIGHT hip prosthesis. Osseous demineralization with degenerative changes LEFT hip joint. No acute abnormalities. Electronically Signed   By: Lavonia Dana M.D.   On: 10/09/2018 17:31    Chart has been reviewed    Assessment/Plan   84 y.o. female with medical history significant of DM  2,  Hip edema, persistent atrial fibrillation, hypothyroidism, diabetic polyneuropathy, morbid obesity.  Admitted for Sirs  Present on Admission:   . SIRS (systemic inflammatory response syndrome) (HCC) unclear etiology suspect respiratory given dry cough, alcohol abuse broad-spectrum antibiotic rehydrate obtain serial lactic acid influenza negative obtain respiratory panel. Abdomen non-tender Acute encephalopathy in the setting of Sirs -rehydrate treat underlying infection continue to follow expect some degree of worsening during the night patient had a fall today CT of the head unremarkable Left-sided chest pain after the fall suspect noncardiac will  cycle cardiac enzymes.  PE being less likely patient is  on anticoagulation . Essential hypertension hold home medications given soft blood pressures . Hyperlipidemia stable continue home medications . Type 2 diabetes mellitus with diabetic polyneuropathy (HCC) order sliding scale check hemoglobin A1c hold  metformin . Morbid obesity ([place])  will need nutritional consult  as an outpatient . Persistent atrial fibrillation -           - CHA2DS2 vas score 4: continue current anticoagulation with    Eliquis,     -  Rate control: When soft blood pressures we will support with fluids restart metoprolol when able          . Hypothyroidism - - Check TSH continue home medications at current dose  . Diabetic neuropathy (HCC) restart Neurontin when able to tolerate abdominal distention will obtain bladder scan plain KUB showed no evidence of obstruction  Increased work of breathing VBG showing no evidence of significant hypercarbia  Will order PRN BiPAP for increased work of breathing given wheezing suspect some reactive airway disease component will initiate steroid taper and nebulizers may  benefit from pulmonology consult if does not improve Other plan as per orders.  DVT prophylaxis:  Eliquis    Code Status:  FULL CODE   as per  family  I had personally discussed CODE STATUS with patient and family  Family Communication:   Family    at  Bedside  plan of care was discussed with   Son,  Husband,   Disposition Plan:   likely will need placement for rehabilitation                                             Would benefit from PT/OT eval prior to DC  Ordered                   Swallow eval - SLP ordered                                    Consults called: none  Admission status:    inpatient     Expect 2 midnight stay secondary to severity of patient's current illness including hemodynamic instability despite optimal treatment (tachycardia  hypoxia)   Severe lab abnormalities including lactic acidosis and extensive comorbidities including:  DM2     That are currently affecting medical management.  I expect  patient to be hospitalized for 2 midnights requiring inpatient medical care.  Patient is at high risk for adverse outcome (such as loss of life or disability) if not treated.  Indication for inpatient stay as follows:  Hemodynamic instability despite  maximal medical therapy,    Need for IV antibiotics, IV fluids IV medications     Level  of care         SDU tele indefinitely please discontinue once patient no longer qualifies       Emanuel Dowson 10/10/2018, 12:20 AM    [facility]  Pager [phone]   after 2 AM please page floor coverage PA If 7AM-7PM, please contact the day team taking care of the patient  [email or web address]  Password [email or web address]

## 2018-10-09 NOTE — ED Provider Notes (Signed)
[facility] DEPT Provider Note   CSN: [id number] Arrival date & time: 10/09/18  1447     History   Chief Complaint Chief Complaint  Patient presents with  . possible uti  . Fever  . Fall    HPI Debra Campbell is a 84 y.o. female with past medical history of diabetes, A. fib, hypertension, who presents to ED for evaluation of altered mental status, fever, fall.  60 of history is provided by son and husband at bedside.  They state that she woke up this morning and "just laid around in bed."  Husband states that he was watching TV when he heard the patient fall down.  He saw her laying on her back, awake after the fall.  Since then they state that "she has been disoriented."  Son states that she did not recognize him.  They deny any loss of consciousness.  Unsure if she hit her head.  She complained of hip pain.  HPI  Past Medical History:  Diagnosis Date  . Aortic calcification ([place]) 2013   Atherosclerotic calcifications of the abdominal aorta and branch noted on CT Abd/Pelvis  . Arthritis    legs, lower back, hands  . Cancer ([place])    endometrial  . Diabetes mellitus   . Ectopic pregnancy   . Eczema   . Headache(784.0)    hx of  . History of kidney stones   . Hyperlipidemia    no meds  . Hypertension   . Hypothyroidism   . Persistent atrial fibrillation 09/28/2016   Relatively new Dx: Asymptomatic.  Rate controlled without medication.  . SVD (spontaneous vaginal delivery)    x 3  . Thyroid disease     Patient Active Problem List   Diagnosis Date Noted  . Peripheral edema 07/25/2018  . Status post total left knee replacement 12/22/2016  . S/P TKR (total knee replacement) 12/22/2016  . Persistent atrial fibrillation 09/28/2016  . Morbid obesity ([place]) 08/18/2015  . Type 2 diabetes mellitus with diabetic polyneuropathy ([place]) 05/01/2015  . Endometrial cancer ([place]) 05/08/2013  . Aortic calcification ([place]) 11/30/2011  . Essential  hypertension 03/18/2011  . Hypothyroidism 03/18/2011  . Osteoarthritis 03/18/2011  . Osteopenia 03/18/2011  . Hyperlipidemia 03/18/2011  . Diabetic neuropathy ([place]) 03/18/2011    Past Surgical History:  Procedure Laterality Date  . ABDOMINAL HYSTERECTOMY Bilateral 06/26/2013   Procedure: TOTAL ABDOMINAL HYSTERECTOMY WITH BILATERAL SALPINGO OOPHERECTOMY WITH PELVIC LYMPHADNECTOMY;  Surgeon: Alvino Chapel, MD;  Location: [facility];  Service: Gynecology;  Laterality: Bilateral;  . BACK SURGERY  06/26/2013  . ECTOPIC PREGNANCY SURGERY    . HERNIA REPAIR    . HYSTEROSCOPY W/D&C N/A 04/26/2013   Procedure: DILATATION AND CURETTAGE /HYSTEROSCOPY;  Surgeon: Woodroe Mode, MD;  Location: [facility];  Service: Gynecology;  Laterality: N/A;  . JOINT REPLACEMENT    . LAPAROTOMY     for ectopic pregnancy  . LAPAROTOMY N/A 06/26/2013   Procedure: EXPLORATORY LAPAROTOMY;  Surgeon: Alvino Chapel, MD;  Location: [facility];  Service: Gynecology;  Laterality: N/A;  . lt knee arthroscopy    . rt total hip replacement    . TONSILLECTOMY    . TOTAL KNEE ARTHROPLASTY Left 12/22/2016   Procedure: LEFT TOTAL KNEE ARTHROPLASTY;  Surgeon: Latanya Maudlin, MD;  Location: [facility];  Service: Orthopedics;  Laterality: Left;  . WISDOM TOOTH EXTRACTION       OB History    Gravida  3   Para  2  Term  2   Preterm  0   AB  1   Living  1     SAB  1   TAB  0   Ectopic  0   Multiple  0   Live Births               Home Medications    Prior to Admission medications   Medication Sig Start Date End Date Taking? Authorizing Provider  acetaminophen (TYLENOL) 500 MG tablet Take 1,000 mg by mouth 2 (two) times daily.    Yes [provider]  furosemide (LASIX) 40 MG tablet Take 1 tablet (40 mg total) by mouth 2 (two) times daily. 07/25/18  Yes Martin, [name], FNP  gabapentin (NEURONTIN) 300 MG capsule Take 1 capsule (300 mg total) by mouth 2 (two) times daily. 07/25/18  Yes  Hassell Done, [name], FNP  levothyroxine (SYNTHROID, LEVOTHROID) 175 MCG tablet Take 1 tablet (175 mcg total) by mouth daily before breakfast. 07/25/18  Yes Hassell Done, [name], FNP  losartan (COZAAR) 50 MG tablet Take 1 tablet (50 mg total) by mouth daily. 07/25/18  Yes Hassell Done, [name], FNP  metFORMIN (GLUCOPHAGE) 1000 MG tablet TAKE 1 TABLET TWICE DAILY WITH A MEAL Patient taking differently: Take 500 mg by mouth 2 (two) times daily with a meal. TAKE 1 TABLET TWICE DAILY WITH A MEAL 07/25/18  Yes Hassell Done, [name], FNP  metoprolol succinate (TOPROL-XL) 25 MG 24 hr tablet Take 1 tablet (25 mg total) by mouth daily. 07/25/18  Yes Hassell Done, [name], FNP  rivaroxaban (XARELTO) 20 MG TABS tablet TAKE 1 TABLET EVERY DAY WITH BREAKFAST 07/25/18  Yes Hassell Done, [name], FNP  Blood Glucose Monitoring Suppl (ACCU-CHEK AVIVA) device Use as instructed 08/11/18 08/11/19  Terald Sleeper, PA-C  clindamycin (CLEOCIN) 150 MG capsule Take 1 capsule (150 mg total) by mouth 3 (three) times daily. Patient not taking: Reported on 10/09/2018 08/09/18   Terald Sleeper, PA-C  glucose blood (ACCU-CHEK AVIVA PLUS) test strip Test 1x per day and prn. [id number] 08/09/18   Terald Sleeper, PA-C  methocarbamol (ROBAXIN) 500 MG tablet Take 1 tablet (500 mg total) by mouth every 6 (six) hours as needed for muscle spasms. Patient not taking: Reported on 10/09/2018 12/23/16   Ardeen Jourdain, PA-C  nystatin cream (MYCOSTATIN) Apply 1 application topically 2 (two) times daily. 08/09/18   Terald Sleeper, PA-C  triamcinolone cream (KENALOG) 0.5 % Apply 1 application topically 3 (three) times daily as needed. Patient taking differently: Apply 1 application topically 3 (three) times daily as needed (rash).  04/13/18   Chevis Pretty, FNP  TRUEPLUS LANCETS 30G MISC CHECK BLOOD SUGAR ONCE A DAY AND AS NEEDED 03/01/17   Chevis Pretty, FNP    Family History Family History  Problem Relation Age of Onset  . Heart  disease Father   . Thyroid disease Father   . Heart attack Father   . Heart disease Sister        open heart surgery  . Cancer Sister        breast  . Neuropathy Sister        secondary to cancer treatment  . Breast cancer Sister   . Suicidality Brother 30  . Arthritis Mother   . Cancer Daughter   . Breast cancer Daughter     Social History Social History   Tobacco Use  . Smoking status: Never Smoker  . Smokeless tobacco: Never Used  Substance Use Topics  . Alcohol use: No  . Drug use:  No     Allergies   Ace inhibitors; Iohexol; Statins; Penicillins; and Sulfa antibiotics   Review of Systems Review of Systems  Unable to perform ROS: Mental status change  Constitutional: Positive for fever.  Musculoskeletal: Positive for arthralgias.     Physical Exam Updated Vital Signs BP 111/66   Pulse 79   Temp (!) 100.7 F (38.2 C) (Rectal)   Resp (!) 23   SpO2 100%   Physical Exam  Constitutional: She appears well-developed and well-nourished. No distress.  2 L of oxygen being delivered via nasal cannula.  HENT:  Head: Normocephalic and atraumatic.  Nose: Nose normal.  Dry mucous membranes.  Eyes: Pupils are equal, round, and reactive to light. Conjunctivae and EOM are normal. Right eye exhibits no discharge. Left eye exhibits no discharge. No scleral icterus.  Neck: Normal range of motion. Neck supple.  Cardiovascular: Normal heart sounds and intact distal pulses. An irregularly irregular rhythm present. Tachycardia present. Exam reveals no gallop and no friction rub.  No murmur heard. Pulmonary/Chest: Effort normal and breath sounds normal. No respiratory distress.  Abdominal: Soft. Bowel sounds are normal. She exhibits no distension. There is no tenderness. There is no guarding.  Musculoskeletal: Normal range of motion. She exhibits tenderness (Right hip, cervical spine at midline). She exhibits no edema.  No step-off palpated, full active and passive range of  motion of R hip and knee without difficulty.  Neurological: She is alert. She exhibits normal muscle tone. Coordination normal.  Alert to self. Unsure of situation, believes it is October 19. No facial asymmetry noted.  Equal grip strength bilaterally.  Skin: Skin is warm and dry. No rash noted.  Psychiatric: She has a normal mood and affect.  Nursing note and vitals reviewed.    ED Treatments / Results  Labs (all labs ordered are listed, but only abnormal results are displayed) Labs Reviewed  COMPREHENSIVE METABOLIC PANEL - Abnormal; Notable for the following components:      Result Value   Glucose, Bld 231 (*)    Creatinine, Ser 1.11 (*)    Calcium 8.4 (*)    Total Bilirubin 1.6 (*)    GFR calc non Af Amer 44 (*)    GFR calc Af Amer 51 (*)    All other components within normal limits  CBC WITH DIFFERENTIAL/PLATELET - Abnormal; Notable for the following components:   Platelets 111 (*)    All other components within normal limits  PROTIME-INR - Abnormal; Notable for the following components:   Prothrombin Time 16.8 (*)    All other components within normal limits  URINALYSIS, ROUTINE W REFLEX MICROSCOPIC - Abnormal; Notable for the following components:   Glucose, UA 50 (*)    Hgb urine dipstick MODERATE (*)    Ketones, ur 5 (*)    Protein, ur 30 (*)    All other components within normal limits  BRAIN NATRIURETIC PEPTIDE - Abnormal; Notable for the following components:   B Natriuretic Peptide 357.2 (*)    All other components within normal limits  I-STAT CG4 LACTIC ACID, ED - Abnormal; Notable for the following components:   Lactic Acid, Venous 2.24 (*)    All other components within normal limits  CULTURE, BLOOD (ROUTINE X 2)  CULTURE, BLOOD (ROUTINE X 2)  URINE CULTURE  RESPIRATORY PANEL BY PCR  TROPONIN I  INFLUENZA PANEL BY PCR (TYPE A & B)  I-STAT CG4 LACTIC ACID, ED    EKG EKG Interpretation  Date/Time:  Monday October 09 2018 15:11:40 EST Ventricular  Rate:  103 PR Interval:    QRS Duration: 69 QT Interval:  431 QTC Calculation: 565 R Axis:   58 Text Interpretation:  Atrial fibrillation Low voltage, extremity leads Nonspecific T abnormalities, lateral leads Prolonged QT interval No significant change from 12/17/2016 Confirmed by Veryl Speak [phone]) on 10/09/2018 3:43:31 PM   Radiology Dg Chest 2 View  Result Date: 10/09/2018 CLINICAL DATA:  Shortness of breath and possible sepsis. EXAM: CHEST - 2 VIEW COMPARISON:  12/17/2016 FINDINGS: Cardiomegaly and mild pulmonary vascular congestion noted. Mild chronic peribronchial thickening again noted. There is no evidence of focal airspace disease, pulmonary edema, suspicious pulmonary nodule/mass, pleural effusion, or pneumothorax. No acute bony abnormalities are identified. IMPRESSION: Cardiomegaly with mild pulmonary vascular congestion and mild chronic peribronchial thickening. Electronically Signed   By: Margarette Canada M.D.   On: 10/09/2018 16:16   Ct Head Wo Contrast  Result Date: 10/09/2018 CLINICAL DATA:  84 year old female with a history of fall EXAM: CT HEAD WITHOUT CONTRAST CT CERVICAL SPINE WITHOUT CONTRAST TECHNIQUE: Multidetector CT imaging of the head and cervical spine was performed following the standard protocol without intravenous contrast. Multiplanar CT image reconstructions of the cervical spine were also generated. COMPARISON:  None. FINDINGS: CT HEAD FINDINGS Brain: No acute intracranial hemorrhage. No midline shift or mass effect. Gray-white differentiation maintained. Unremarkable appearance of the ventricular system. Mild volume loss. Vascular: Calcifications of the intracranial vasculature. Skull: No acute fracture.  No aggressive bone lesion identified. Sinuses/Orbits: Unremarkable appearance of the orbits. Mastoid air cells clear. No middle ear effusion. No significant sinus disease. Other: None CT CERVICAL SPINE FINDINGS Alignment: Straightening of the normal cervical  lordosis, potentially positional. Anterolisthesis of C3 on C4 measuring 3 mm. Facets maintain alignment.  Craniocervical junction is aligned. Skull base and vertebrae: No acute fracture. Skull base is aligned. No aggressive bony lesions. Soft tissues and spinal canal: No canal hematoma. Calcifications of the carotid vasculature. No adenopathy. Disc levels: C2-C3: Mild disc disease without significant foraminal narrowing or canal narrowing. Early uncovertebral joint disease. C3-C4: Left greater than right foraminal narrowing secondary to uncovertebral joint disease and facet disease. C4-C5: Bilateral foraminal narrowing secondary to uncovertebral joint disease and facet spurring. C5-C6: Bilateral foraminal narrowing secondary to uncovertebral joint disease and facet disease. C6-C7: Bilateral foraminal narrowing secondary to uncovertebral joint disease and facet disease. C7-T1: No significant canal narrowing. No significant foraminal narrowing. Upper chest: Negative. Other: None IMPRESSION: Head CT: No acute intracranial abnormality. Mild brain volume loss. Intracranial atherosclerosis. Cervical CT: No acute fracture or malalignment of the cervical spine. Multilevel degenerative changes as above. Electronically Signed   By: Corrie Mckusick D.O.   On: 10/09/2018 17:47   Ct Cervical Spine Wo Contrast  Result Date: 10/09/2018 CLINICAL DATA:  84 year old female with a history of fall EXAM: CT HEAD WITHOUT CONTRAST CT CERVICAL SPINE WITHOUT CONTRAST TECHNIQUE: Multidetector CT imaging of the head and cervical spine was performed following the standard protocol without intravenous contrast. Multiplanar CT image reconstructions of the cervical spine were also generated. COMPARISON:  None. FINDINGS: CT HEAD FINDINGS Brain: No acute intracranial hemorrhage. No midline shift or mass effect. Gray-white differentiation maintained. Unremarkable appearance of the ventricular system. Mild volume loss. Vascular: Calcifications of  the intracranial vasculature. Skull: No acute fracture.  No aggressive bone lesion identified. Sinuses/Orbits: Unremarkable appearance of the orbits. Mastoid air cells clear. No middle ear effusion. No significant sinus disease. Other: None CT CERVICAL SPINE FINDINGS Alignment: Straightening of the normal cervical  lordosis, potentially positional. Anterolisthesis of C3 on C4 measuring 3 mm. Facets maintain alignment.  Craniocervical junction is aligned. Skull base and vertebrae: No acute fracture. Skull base is aligned. No aggressive bony lesions. Soft tissues and spinal canal: No canal hematoma. Calcifications of the carotid vasculature. No adenopathy. Disc levels: C2-C3: Mild disc disease without significant foraminal narrowing or canal narrowing. Early uncovertebral joint disease. C3-C4: Left greater than right foraminal narrowing secondary to uncovertebral joint disease and facet disease. C4-C5: Bilateral foraminal narrowing secondary to uncovertebral joint disease and facet spurring. C5-C6: Bilateral foraminal narrowing secondary to uncovertebral joint disease and facet disease. C6-C7: Bilateral foraminal narrowing secondary to uncovertebral joint disease and facet disease. C7-T1: No significant canal narrowing. No significant foraminal narrowing. Upper chest: Negative. Other: None IMPRESSION: Head CT: No acute intracranial abnormality. Mild brain volume loss. Intracranial atherosclerosis. Cervical CT: No acute fracture or malalignment of the cervical spine. Multilevel degenerative changes as above. Electronically Signed   By: Corrie Mckusick D.O.   On: 10/09/2018 17:47   Dg Hip Unilat W Or Wo Pelvis 2-3 Views Right  Result Date: 10/09/2018 CLINICAL DATA:  Pelvic and RIGHT hip pain, fell earlier today EXAM: DG HIP (WITH OR WITHOUT PELVIS) 2-3V RIGHT COMPARISON:  No 03/02/2007 FINDINGS: Osseous demineralization. RIGHT hip prosthesis. Degenerative changes LEFT hip joint with significant joint space narrowing.  Degenerative disc and facet disease changes at visualized lower lumbar spine. No acute fracture, dislocation, or bone destruction. IMPRESSION: RIGHT hip prosthesis. Osseous demineralization with degenerative changes LEFT hip joint. No acute abnormalities. Electronically Signed   By: Lavonia Dana M.D.   On: 10/09/2018 17:31    Procedures Procedures (including critical care time)  CRITICAL CARE Performed by: Delia Heady   Total critical care time: 35 minutes  Critical care time was exclusive of separately billable procedures and treating other patients.  Critical care was necessary to treat or prevent imminent or life-threatening deterioration.  Critical care was time spent personally by me on the following activities: development of treatment plan with patient and/or surrogate as well as nursing, discussions with consultants, evaluation of patient's response to treatment, examination of patient, obtaining history from patient or surrogate, ordering and performing treatments and interventions, ordering and review of laboratory studies, ordering and review of radiographic studies, pulse oximetry and re-evaluation of patient's condition.   Medications Ordered in ED Medications  sodium chloride 0.9 % bolus 1,000 mL (0 mLs Intravenous Stopped 10/09/18 1718)  acetaminophen (TYLENOL) tablet 650 mg (650 mg Oral Given 10/09/18 1619)  vancomycin (VANCOCIN) IVPB 1000 mg/200 mL premix (0 mg Intravenous Stopped 10/09/18 1739)  aztreonam (AZACTAM) 2 g in sodium chloride 0.9 % 100 mL IVPB (0 g Intravenous Stopped 10/09/18 1900)     Initial Impression / Assessment and Plan / ED Course  I have reviewed the triage vital signs and the nursing notes.  Pertinent labs & imaging results that were available during my care of the patient were reviewed by me and considered in my medical decision making (see chart for details).  Clinical Course as of Oct 09 1900  Mon Oct 09, 2018  1457 rectal  Temp(!): 103.2  F (39.6 C) [HK]  1457 Pulse Rate(!): 103 [HK]  1710 Lactic Acid, Venous(!!): 2.24 [HK]  1718 Patient spO2 90% on RA prior to 2L [place] began.   [HK]    Clinical Course User Index [HK] Delia Heady, PA-C    84 year old female with past medical history of A. fib currently anticoagulated on Xarelto, diabetes, hypertension presents  to ED for altered mental status, fever and fall.  Husband and son at bedside states that she had a fall this morning where her husband found her laying on her back conscious.  Since then son states that she has been "disoriented" and has not recognized him.  Patient found to be febrile to 103.2 rectally here.  Heart rate 103, consistent with A. fib.  Elevated lactic at 2.24.  WBC count is normal.  Chest x-ray shows vascular congestion, BNP slightly elevated to 300s.  Troponin negative..  CT of the head and neck are negative.  Flu swab pending at this time.  Patient treated for sepsis of unknown origin.  Hospitalist to admit.  Portions of this note were generated with [employer]obby[employer]. Dictation errors may occur despite best attempts at proofreading.  Final Clinical Impressions(s) / ED Diagnoses   Final diagnoses:  Sepsis, due to unspecified organism, unspecified whether acute organ dysfunction present [facility])    ED Discharge Orders    None       Delia Heady, PA-C 10/09/18 1903    Lacretia Leigh, MD 10/12/18 1601

## 2018-10-09 NOTE — ED Notes (Signed)
Pt very restless.

## 2018-10-09 NOTE — ED Triage Notes (Addendum)
Patient arrived via [facility] from home. Patient fell this morning and ems called but patient did not want to be brought in. Couple hours later family called due to patient being disoriented. This time patient was a lot slower to respond and warm to touch. Patient states urine has been brown. Patient states her symptoms feel like the last time she has a UTI.   100.4 oral Hr-100 Bp-138/84 91% RA  99% on 2 liters 927ml NS given per ems.   Hx. Uti

## 2018-10-09 NOTE — ED Notes (Signed)
Notified Saralyn Pilar from respiratory that BVG was ready.

## 2018-10-09 NOTE — Progress Notes (Addendum)
A consult was received from an ED physician for vancomycin and aztreonam per pharmacy dosing.  The patient's profile has been reviewed for ht/wt/allergies/indication/available labs.   A one time order has been placed for vancomycin 1gm and aztreonam 2gm.    Further antibiotics/pharmacy consults should be ordered by admitting physician if indicated.                       Thank you, Dolly Rias RPh 10/09/2018, 3:58 PM Pager [phone]

## 2018-10-09 NOTE — ED Provider Notes (Signed)
Medical screening examination/treatment/procedure(s) were conducted as a shared visit with non-physician practitioner(s) and myself.  I personally evaluated the patient during the encounter.  EKG Interpretation  Date/Time:  Monday October 09 2018 15:11:40 EST Ventricular Rate:  103 PR Interval:    QRS Duration: 69 QT Interval:  431 QTC Calculation: 565 R Axis:   58 Text Interpretation:  Atrial fibrillation Low voltage, extremity leads Nonspecific T abnormalities, lateral leads Prolonged QT interval No significant change from 12/17/2016 Confirmed by Veryl Speak [phone]) on 10/09/2018 3:68:36 PM 84 year old female presents from home after a fall due to her legs getting weak from under her.  Was found to have a temperature of 103.2.  Has elevated lactate 2.24.  Has had urinary symptoms and patient started on empiric anti-biotics and will be admitted to the hospital   Lacretia Leigh, MD 10/09/18 1629

## 2018-10-09 NOTE — ED Notes (Signed)
PA at bedside.

## 2018-10-09 NOTE — ED Notes (Signed)
Pt transported to xray 

## 2018-10-09 NOTE — Progress Notes (Signed)
Pharmacy Antibiotic Note  Debra Campbell is a 84 y.o. female admitted on 10/09/2018 with sepsis.  Pharmacy has been consulted for Vancomycin and Aztreonam dosing.  Initial doses given in ED.   PCN with reaction swelling, rash noted.  No documentation that patient has tolerated cephalosporins in the past.   10/09/2018:  Tm 103.15F rectally  No leukocytosis, LA now WNL.  PCT pending.   Scr 1.11- slightly elevated from baseline  CXR + congestion, UA negative  Plan: Vancomycin 1gm IV q24h for target AUC 400-500 ?Check MRSA PCR if presumed respiratory source Monitor renal function and cx data  Aztreonam 2gm IV q8h     Temp (24hrs), Avg:102 F (38.9 C), Min:100.7 F (38.2 C), Max:103.2 F (39.6 C)  Recent Labs  Lab 10/09/18 1455 10/09/18 1516 10/09/18 1755  WBC 6.3  --   --   CREATININE 1.11*  --   --   LATICACIDVEN  --  2.24* 1.59    CrCl cannot be calculated (Unknown ideal weight.).    Allergies  Allergen Reactions  . Ace Inhibitors Cough  . Iohexol      Desc: HIVES 40 YEARS AGO   . Statins     myalgia  . Penicillins Swelling and Rash    Has patient had a PCN reaction causing immediate rash, facial/tongue/throat swelling, SOB or lightheadedness with hypotension: Yes Has patient had a PCN reaction causing severe rash involving mucus membranes or skin necrosis: No Has patient had a PCN reaction that required hospitalization Yes Has patient had a PCN reaction occurring within the last 10 years: No If all of the above answers are "NO", then may proceed with Cephalosporin use.   . Sulfa Antibiotics Rash    Antimicrobials this admission: 11/11 Aztreonam >>  11/11 Vancomycin >>   Dose adjustments this admission:  Microbiology results: 11/11 BCx:  11/11 UCx:  11/11 Resp PCR:  11/11 Influenza PCR: negative MRSA PCR:   Thank you for allowing pharmacy to be a part of this patient's care.  Biagio Borg 10/09/2018 9:23 PM

## 2018-10-10 ENCOUNTER — Other Ambulatory Visit: Payer: Self-pay

## 2018-10-10 LAB — COMPREHENSIVE METABOLIC PANEL
ALT: 23 U/L (ref 0–44)
ANION GAP: 8 (ref 5–15)
AST: 43 U/L — ABNORMAL HIGH (ref 15–41)
Albumin: 3.4 g/dL — ABNORMAL LOW (ref 3.5–5.0)
Alkaline Phosphatase: 46 U/L (ref 38–126)
BUN: 15 mg/dL (ref 8–23)
CHLORIDE: 104 mmol/L (ref 98–111)
CO2: 25 mmol/L (ref 22–32)
Calcium: 7.9 mg/dL — ABNORMAL LOW (ref 8.9–10.3)
Creatinine, Ser: 1.02 mg/dL — ABNORMAL HIGH (ref 0.44–1.00)
GFR calc non Af Amer: 49 mL/min — ABNORMAL LOW (ref >60)
GFR, EST AFRICAN AMERICAN: 57 mL/min — AB (ref >60)
Glucose, Bld: 187 mg/dL — ABNORMAL HIGH (ref 70–99)
Potassium: 4.2 mmol/L (ref 3.5–5.1)
SODIUM: 137 mmol/L (ref 135–145)
Total Bilirubin: 1.2 mg/dL (ref 0.3–1.2)
Total Protein: 6.3 g/dL — ABNORMAL LOW (ref 6.5–8.1)

## 2018-10-10 LAB — HEMOGLOBIN A1C
Hgb A1c MFr Bld: 6.7 % — ABNORMAL HIGH (ref 4.8–5.6)
MEAN PLASMA GLUCOSE: 145.59 mg/dL

## 2018-10-10 LAB — MRSA PCR SCREENING: MRSA BY PCR: NEGATIVE

## 2018-10-10 LAB — CBC
HCT: 36.4 % (ref 36.0–46.0)
HEMOGLOBIN: 11.7 g/dL — AB (ref 12.0–15.0)
MCH: 31.3 pg (ref 26.0–34.0)
MCHC: 32.1 g/dL (ref 30.0–36.0)
MCV: 97.3 fL (ref 80.0–100.0)
Platelets: 98 10*3/uL — ABNORMAL LOW (ref 150–400)
RBC: 3.74 MIL/uL — AB (ref 3.87–5.11)
RDW: 13.9 % (ref 11.5–15.5)
WBC: 5.6 10*3/uL (ref 4.0–10.5)
nRBC: 0 % (ref 0.0–0.2)

## 2018-10-10 LAB — CBG MONITORING, ED
GLUCOSE-CAPILLARY: 190 mg/dL — AB (ref 70–99)
Glucose-Capillary: 253 mg/dL — ABNORMAL HIGH (ref 70–99)

## 2018-10-10 LAB — RESPIRATORY PANEL BY PCR
ADENOVIRUS-RVPPCR: NOT DETECTED
Bordetella pertussis: NOT DETECTED
CHLAMYDOPHILA PNEUMONIAE-RVPPCR: NOT DETECTED
Coronavirus 229E: NOT DETECTED
Coronavirus HKU1: NOT DETECTED
Coronavirus NL63: NOT DETECTED
Coronavirus OC43: NOT DETECTED
INFLUENZA B-RVPPCR: NOT DETECTED
Influenza A: NOT DETECTED
MYCOPLASMA PNEUMONIAE-RVPPCR: NOT DETECTED
Metapneumovirus: NOT DETECTED
PARAINFLUENZA VIRUS 4-RVPPCR: NOT DETECTED
Parainfluenza Virus 1: NOT DETECTED
Parainfluenza Virus 2: NOT DETECTED
Parainfluenza Virus 3: NOT DETECTED
RESPIRATORY SYNCYTIAL VIRUS-RVPPCR: NOT DETECTED
Rhinovirus / Enterovirus: NOT DETECTED

## 2018-10-10 LAB — GLUCOSE, CAPILLARY
Glucose-Capillary: 270 mg/dL — ABNORMAL HIGH (ref 70–99)
Glucose-Capillary: 196 mg/dL — ABNORMAL HIGH (ref 70–99)

## 2018-10-10 LAB — URINE CULTURE: Culture: NO GROWTH

## 2018-10-10 LAB — PROCALCITONIN: Procalcitonin: 2.33 ng/mL

## 2018-10-10 LAB — TROPONIN I
TROPONIN I: 0.05 ng/mL — AB (ref <0.03)
TROPONIN I: 0.04 ng/mL — AB (ref <0.03)
Troponin I: 0.04 ng/mL (ref <0.03)

## 2018-10-10 LAB — MAGNESIUM: MAGNESIUM: 1.6 mg/dL — AB (ref 1.7–2.4)

## 2018-10-10 LAB — PHOSPHORUS: PHOSPHORUS: 3 mg/dL (ref 2.5–4.6)

## 2018-10-10 LAB — LACTIC ACID, PLASMA
LACTIC ACID, VENOUS: 1.8 mmol/L (ref 0.5–1.9)
Lactic Acid, Venous: 1.8 mmol/L (ref 0.5–1.9)

## 2018-10-10 LAB — TSH: TSH: 0.799 u[IU]/mL (ref 0.350–4.500)

## 2018-10-10 MED ORDER — VANCOMYCIN HCL IN DEXTROSE 750-5 MG/150ML-% IV SOLN
750.0000 mg | INTRAVENOUS | Status: DC
  Administered 2018-10-10: 750 mg via INTRAVENOUS
  Filled 2018-10-10: qty 150

## 2018-10-10 MED ORDER — LEVALBUTEROL HCL 0.63 MG/3ML IN NEBU
0.6300 mg | INHALATION_SOLUTION | Freq: Three times a day (TID) | RESPIRATORY_TRACT | Status: DC
  Administered 2018-10-10 – 2018-10-11 (×4): 0.63 mg via RESPIRATORY_TRACT
  Filled 2018-10-10 (×4): qty 3

## 2018-10-10 MED ORDER — VANCOMYCIN HCL 10 G IV SOLR
1250.0000 mg | INTRAVENOUS | Status: DC
  Filled 2018-10-10: qty 1250

## 2018-10-10 MED ORDER — SODIUM CHLORIDE 0.9 % IV SOLN
INTRAVENOUS | Status: DC
  Administered 2018-10-10: 15:00:00 via INTRAVENOUS

## 2018-10-10 MED ORDER — METOPROLOL SUCCINATE ER 25 MG PO TB24
25.0000 mg | ORAL_TABLET | Freq: Every day | ORAL | Status: DC
  Administered 2018-10-10 – 2018-10-13 (×4): 25 mg via ORAL
  Filled 2018-10-10 (×4): qty 1

## 2018-10-10 NOTE — ED Notes (Signed)
ED TO INPATIENT HANDOFF REPORT  Name/Age/Gender Debra Campbell 84 y.o. female  Code Status    Code Status Orders  (From admission, onward)         Start     Ordered   10/09/18 2110  Full code  Continuous     10/09/18 2109        Code Status History    Date Active Date Inactive Code Status Order ID Comments User Context   10/09/2018 2109 10/09/2018 2109 Full Code [id number]  Toy Baker, MD ED   12/22/2016 1240 12/24/2016 1705 Full Code [id number]  Latanya Maudlin, MD Inpatient   06/26/2013 1238 06/28/2013 1453 Full Code [id number]  Lahoma Crocker, MD Inpatient      Home/SNF/Other Home  Chief Complaint Possible Sepsis  Level of Care/Admitting Diagnosis ED Disposition    ED Disposition Condition [facility] Area: [facility] [[id number]]  Level of Care: Stepdown [14]  Admit to SDU based on following criteria: Hemodynamic compromise or significant risk of instability:  Patient requiring short term acute titration and management of vasoactive drips, and invasive monitoring (i.e., CVP and Arterial line).  Diagnosis: Sepsis [facility]) [[id number]]  Admitting Physician: Toy Baker [[id number]]  Attending Physician: Toy Baker [[id number]]  Estimated length of stay: 3 - 4 days  Certification:: I certify this patient will need inpatient services for at least 2 midnights  PT Class (Do Not Modify): Inpatient [101]  PT Acc Code (Do Not Modify): Private [1]       Medical History Past Medical History:  Diagnosis Date  . Aortic calcification ([place]) 2013   Atherosclerotic calcifications of the abdominal aorta and branch noted on CT Abd/Pelvis  . Arthritis    legs, lower back, hands  . Cancer ([place])    endometrial  . Diabetes mellitus   . Ectopic pregnancy   . Eczema   . Headache(784.0)    hx of  . History of kidney stones   . Hyperlipidemia    no meds  . Hypertension   . Hypothyroidism   . Persistent atrial fibrillation 09/28/2016    Relatively new Dx: Asymptomatic.  Rate controlled without medication.  . SVD (spontaneous vaginal delivery)    x 3  . Thyroid disease     Allergies Allergies  Allergen Reactions  . Ace Inhibitors Cough  . Iohexol      Desc: HIVES 40 YEARS AGO   . Statins     myalgia  . Penicillins Swelling and Rash    Has patient had a PCN reaction causing immediate rash, facial/tongue/throat swelling, SOB or lightheadedness with hypotension: Yes Has patient had a PCN reaction causing severe rash involving mucus membranes or skin necrosis: No Has patient had a PCN reaction that required hospitalization Yes Has patient had a PCN reaction occurring within the last 10 years: No If all of the above answers are "NO", then may proceed with Cephalosporin use.   . Sulfa Antibiotics Rash    IV Location/Drains/Wounds Patient Lines/Drains/Airways Status   Active Line/Drains/Airways    Name:   Placement date:   Placement time:   Site:   Days:   Peripheral IV 10/09/18 Right Antecubital   10/09/18    1615    Antecubital   1   External Urinary Catheter   10/09/18    1512    --   1   Incision (Closed) 12/22/16 Knee Left   12/22/16    0942     657  Labs/Imaging Results for orders placed or performed during the hospital encounter of 10/09/18 (from the past 48 hour(s))  Comprehensive metabolic panel     Status: Abnormal   Collection Time: 10/09/18  2:55 PM  Result Value Ref Range   Sodium 136 135 - 145 mmol/L   Potassium 4.1 3.5 - 5.1 mmol/L   Chloride 101 98 - 111 mmol/L   CO2 24 22 - 32 mmol/L   Glucose, Bld 231 (H) 70 - 99 mg/dL   BUN 12 8 - 23 mg/dL   Creatinine, Ser 1.11 (H) 0.44 - 1.00 mg/dL   Calcium 8.4 (L) 8.9 - 10.3 mg/dL   Total Protein 7.0 6.5 - 8.1 g/dL   Albumin 3.9 3.5 - 5.0 g/dL   AST 36 15 - 41 U/L   ALT 23 0 - 44 U/L   Alkaline Phosphatase 51 38 - 126 U/L   Total Bilirubin 1.6 (H) 0.3 - 1.2 mg/dL   GFR calc non Af Amer 44 (L) >60 mL/min   GFR calc Af Amer 51 (L) >60  mL/min    Comment: (NOTE) The eGFR has been calculated using the CKD EPI equation. This calculation has not been validated in all clinical situations. eGFR's persistently <60 mL/min signify possible Chronic Kidney Disease.    Anion gap 11 5 - 15    Comment: Performed at [facility], [place] [address], [place], [place] [address]  CBC with Differential     Status: Abnormal   Collection Time: 10/09/18  2:55 PM  Result Value Ref Range   WBC 6.3 4.0 - 10.5 K/uL   RBC 3.92 3.87 - 5.11 MIL/uL   Hemoglobin 12.3 12.0 - 15.0 g/dL   HCT 38.8 36.0 - 46.0 %   MCV 99.0 80.0 - 100.0 fL   MCH 31.4 26.0 - 34.0 pg   MCHC 31.7 30.0 - 36.0 g/dL   RDW 13.6 11.5 - 15.5 %   Platelets 111 (L) 150 - 400 K/uL    Comment: Immature Platelet Fraction may be clinically indicated, consider ordering this additional test [id number]    nRBC 0.0 0.0 - 0.2 %   Neutrophils Relative % 85 %   Neutro Abs 5.4 1.7 - 7.7 K/uL   Lymphocytes Relative 7 %   Lymphs Abs 0.4 (L) 0.7 - 4.0 K/uL   Monocytes Relative 7 %   Monocytes Absolute 0.4 0.1 - 1.0 K/uL   Eosinophils Relative 0 %   Eosinophils Absolute 0.0 0.0 - 0.5 K/uL   Basophils Relative 0 %   Basophils Absolute 0.0 0.0 - 0.1 K/uL   WBC Morphology See Note     Comment: WBC COUNT CONFIRMED BY SMEAR   RBC Morphology MORPHOLOGY UNREMARKABLE    Smear Review PLATELET COUNT CONFIRMED BY SMEAR    Immature Granulocytes 1 %   Abs Immature Granulocytes 0.04 0.00 - 0.07 K/uL    Comment: Performed at [facility], [place] [address] [place], [place] [address]  Protime-INR     Status: Abnormal   Collection Time: 10/09/18  2:55 PM  Result Value Ref Range   Prothrombin Time 16.8 (H) 11.4 - 15.2 seconds   INR 1.38     Comment: Performed at [facility], [place] [address], [place], [place] [address]  Troponin I - Once     Status: None   Collection Time: 10/09/18  3:02 PM  Result Value Ref Range   Troponin I <0.03 <0.03 ng/mL     Comment: Performed at  [facility], [place] [address] [place], [place] [address]  Brain natriuretic peptide     Status: Abnormal   Collection Time: 10/09/18  3:02 PM  Result Value Ref Range   B Natriuretic Peptide 357.2 (H) 0.0 - 100.0 pg/mL    Comment: Performed at [facility], [place] [address], [place], [place] [address]  I-Stat CG4 Lactic Acid, ED     Status: Abnormal   Collection Time: 10/09/18  3:16 PM  Result Value Ref Range   Lactic Acid, Venous 2.24 (HH) 0.5 - 1.9 mmol/L   Comment NOTIFIED PHYSICIAN   Urinalysis, Routine w reflex microscopic     Status: Abnormal   Collection Time: 10/09/18  4:19 PM  Result Value Ref Range   Color, Urine YELLOW YELLOW   APPearance CLEAR CLEAR   Specific Gravity, Urine 1.019 1.005 - 1.030   pH 6.0 5.0 - 8.0   Glucose, UA 50 (A) NEGATIVE mg/dL   Hgb urine dipstick MODERATE (A) NEGATIVE   Bilirubin Urine NEGATIVE NEGATIVE   Ketones, ur 5 (A) NEGATIVE mg/dL   Protein, ur 30 (A) NEGATIVE mg/dL   Nitrite NEGATIVE NEGATIVE   Leukocytes, UA NEGATIVE NEGATIVE   RBC / HPF 21-50 0 - 5 RBC/hpf   WBC, UA 0-5 0 - 5 WBC/hpf   Bacteria, UA NONE SEEN NONE SEEN   Squamous Epithelial / LPF 0-5 0 - 5   Mucus PRESENT     Comment: Performed at [facility], [place] [address] [place], [place] [address]  I-Stat CG4 Lactic Acid, ED     Status: None   Collection Time: 10/09/18  5:55 PM  Result Value Ref Range   Lactic Acid, Venous 1.59 0.5 - 1.9 mmol/L  Influenza panel by PCR (type A & B)     Status: None   Collection Time: 10/09/18  7:01 PM  Result Value Ref Range   Influenza A By PCR NEGATIVE NEGATIVE   Influenza B By PCR NEGATIVE NEGATIVE    Comment: (NOTE) The Xpert Xpress Flu assay is intended as an aid in the diagnosis of  influenza and should not be used as a sole basis for treatment.  This  assay is FDA approved for nasopharyngeal swab specimens only. Nasal  washings and aspirates are  unacceptable for Xpert Xpress Flu testing. Performed at [facility], [place] [address], [place], [place] [address]   Respiratory Panel by PCR     Status: None   Collection Time: 10/09/18  7:01 PM  Result Value Ref Range   Adenovirus NOT DETECTED NOT DETECTED   Coronavirus 229E NOT DETECTED NOT DETECTED   Coronavirus HKU1 NOT DETECTED NOT DETECTED   Coronavirus NL63 NOT DETECTED NOT DETECTED   Coronavirus OC43 NOT DETECTED NOT DETECTED   Metapneumovirus NOT DETECTED NOT DETECTED   Rhinovirus / Enterovirus NOT DETECTED NOT DETECTED   Influenza A NOT DETECTED NOT DETECTED   Influenza B NOT DETECTED NOT DETECTED   Parainfluenza Virus 1 NOT DETECTED NOT DETECTED   Parainfluenza Virus 2 NOT DETECTED NOT DETECTED   Parainfluenza Virus 3 NOT DETECTED NOT DETECTED   Parainfluenza Virus 4 NOT DETECTED NOT DETECTED   Respiratory Syncytial Virus NOT DETECTED NOT DETECTED   Bordetella pertussis NOT DETECTED NOT DETECTED   Chlamydophila pneumoniae NOT DETECTED NOT DETECTED   Mycoplasma pneumoniae NOT DETECTED NOT DETECTED    Comment: Performed at [place] [address], [place], [place] [address]  CBG monitoring, ED  Status: Abnormal   Collection Time: 10/09/18 11:32 PM  Result Value Ref Range   Glucose-Capillary 204 (H) 70 - 99 mg/dL  Lactic acid, plasma     Status: None   Collection Time: 10/10/18 12:58 AM  Result Value Ref Range   Lactic Acid, Venous 1.8 0.5 - 1.9 mmol/L    Comment: Performed at [facility], [place] [address], [place], [place] [address]  Procalcitonin     Status: None   Collection Time: 10/10/18 12:58 AM  Result Value Ref Range   Procalcitonin 2.33 ng/mL    Comment:        Interpretation: PCT > 2 ng/mL: Systemic infection (sepsis) is likely, unless other causes are known. (NOTE)       Sepsis PCT Algorithm           Lower Respiratory Tract                                      Infection PCT Algorithm     ----------------------------     ----------------------------         PCT < 0.25 ng/mL                PCT < 0.10 ng/mL         Strongly encourage             Strongly discourage   discontinuation of antibiotics    initiation of antibiotics    ----------------------------     -----------------------------       PCT 0.25 - 0.50 ng/mL            PCT 0.10 - 0.25 ng/mL               OR       >80% decrease in PCT            Discourage initiation of                                            antibiotics      Encourage discontinuation           of antibiotics    ----------------------------     -----------------------------         PCT >= 0.50 ng/mL              PCT 0.26 - 0.50 ng/mL               AND       <80% decrease in PCT              Encourage initiation of                                             antibiotics       Encourage continuation           of antibiotics    ----------------------------     -----------------------------        PCT >= 0.50 ng/mL                  PCT > 0.50 ng/mL  AND         increase in PCT                  Strongly encourage                                      initiation of antibiotics    Strongly encourage escalation           of antibiotics                                     -----------------------------                                           PCT <= 0.25 ng/mL                                                 OR                                        > 80% decrease in PCT                                     Discontinue / Do not initiate                                             antibiotics Performed at [place] [address], [place], [place] [address]   Troponin I - Now Then Q6H     Status: Abnormal   Collection Time: 10/10/18 12:58 AM  Result Value Ref Range   Troponin I 0.04 (HH) <0.03 ng/mL    Comment: CRITICAL RESULT CALLED TO, READ BACK BY AND VERIFIED WITHHulda Marin RN [id number] 10/10/18 Lezlie Octave Performed at [facility], [place] [address] [place], [place] [address]   Lactic acid, plasma     Status: None   Collection Time: 10/10/18  5:14 AM  Result Value Ref Range   Lactic Acid, Venous 1.8 0.5 - 1.9 mmol/L    Comment: Performed at [facility], [place] [address], [place], [place] [address]  TSH     Status: None   Collection Time: 10/10/18  5:14 AM  Result Value Ref Range   TSH 0.799 0.350 - 4.500 uIU/mL    Comment: Performed by a 3rd Generation assay with a functional sensitivity of <=0.01 uIU/mL. Performed at [facility], [place] [address], [place], [place] [address]   Troponin I - Now Then Q6H     Status: Abnormal   Collection Time: 10/10/18  5:14 AM  Result Value Ref Range   Troponin I 0.05 (HH) <0.03 ng/mL    Comment: CRITICAL VALUE NOTED.  VALUE IS CONSISTENT WITH PREVIOUSLY REPORTED AND CALLED VALUE. Performed at [facility], [place] [address], [place], [place] [address]   Hemoglobin A1c     Status: Abnormal   Collection Time: 10/10/18  5:14 AM  Result Value Ref Range   Hgb A1c MFr Bld 6.7 (H) 4.8 - 5.6 %    Comment: (NOTE) Pre diabetes:          5.7%-6.4% Diabetes:              >6.4% Glycemic control for   <7.0% adults with diabetes    Mean Plasma Glucose 145.59 mg/dL    Comment: Performed at [place] [address] [place], [place] [address]  Comprehensive metabolic panel     Status: Abnormal   Collection Time: 10/10/18  5:14 AM  Result Value Ref Range   Sodium 137 135 - 145 mmol/L   Potassium 4.2 3.5 - 5.1 mmol/L   Chloride 104 98 - 111 mmol/L   CO2 25 22 - 32 mmol/L   Glucose, Bld 187 (H) 70 - 99 mg/dL   BUN 15 8 - 23 mg/dL   Creatinine, Ser 1.02 (H) 0.44 - 1.00 mg/dL   Calcium 7.9 (L) 8.9 - 10.3 mg/dL   Total Protein 6.3 (L) 6.5 - 8.1 g/dL   Albumin 3.4 (L) 3.5 - 5.0 g/dL   AST 43 (H) 15 - 41 U/L   ALT 23 0 - 44 U/L   Alkaline Phosphatase 46 38 - 126 U/L   Total  Bilirubin 1.2 0.3 - 1.2 mg/dL   GFR calc non Af Amer 49 (L) >60 mL/min   GFR calc Af Amer 57 (L) >60 mL/min    Comment: (NOTE) The eGFR has been calculated using the CKD EPI equation. This calculation has not been validated in all clinical situations. eGFR's persistently <60 mL/min signify possible Chronic Kidney Disease.    Anion gap 8 5 - 15    Comment: Performed at [facility], [place] [address] [place], [place] [address]  Magnesium     Status: Abnormal   Collection Time: 10/10/18  5:14 AM  Result Value Ref Range   Magnesium 1.6 (L) 1.7 - 2.4 mg/dL    Comment: Performed at [facility], [place] [address], [place], [place] [address]  Phosphorus     Status: None   Collection Time: 10/10/18  5:14 AM  Result Value Ref Range   Phosphorus 3.0 2.5 - 4.6 mg/dL    Comment: Performed at [facility], [place] [address], [place], [place] [address]  CBC     Status: Abnormal   Collection Time: 10/10/18  5:14 AM  Result Value Ref Range   WBC 5.6 4.0 - 10.5 K/uL   RBC 3.74 (L) 3.87 - 5.11 MIL/uL   Hemoglobin 11.7 (L) 12.0 - 15.0 g/dL   HCT 36.4 36.0 - 46.0 %   MCV 97.3 80.0 - 100.0 fL   MCH 31.3 26.0 - 34.0 pg   MCHC 32.1 30.0 - 36.0 g/dL   RDW 13.9 11.5 - 15.5 %   Platelets 98 (L) 150 - 400 K/uL    Comment: REPEATED TO VERIFY PLATELET COUNT CONFIRMED BY SMEAR SPECIMEN CHECKED FOR CLOTS Immature Platelet Fraction may be clinically indicated, consider ordering this additional test [id number]    nRBC 0.0 0.0 - 0.2 %    Comment: Performed at [facility], [place] [address] [place],  [address]  CBG monitoring, ED     Status: Abnormal   Collection Time: 10/10/18  7:55 AM  Result Value Ref Range   Glucose-Capillary 190 (H) 70 - 99 mg/dL  Troponin I - Now Then Q6H     Status: Abnormal   Collection Time: 10/10/18 10:01 AM  Result Value Ref Range   Troponin I 0.04 (HH) <0.03 ng/mL    Comment: CRITICAL VALUE NOTED.   VALUE IS CONSISTENT WITH PREVIOUSLY REPORTED AND CALLED VALUE. Performed at [facility], [place] [address], [place], [place] [address]    Dg Chest 2 View  Result Date: 10/09/2018 CLINICAL DATA:  Shortness of breath and possible sepsis. EXAM: CHEST - 2 VIEW COMPARISON:  12/17/2016 FINDINGS: Cardiomegaly and mild pulmonary vascular congestion noted. Mild chronic peribronchial thickening again noted. There is no evidence of focal airspace disease, pulmonary edema, suspicious pulmonary nodule/mass, pleural effusion, or pneumothorax. No acute bony abnormalities are identified. IMPRESSION: Cardiomegaly with mild pulmonary vascular congestion and mild chronic peribronchial thickening. Electronically Signed   By: Margarette Canada M.D.   On: 10/09/2018 16:16   Dg Abd 1 View  Result Date: 10/09/2018 CLINICAL DATA:  Abdominal distension EXAM: ABDOMEN - 1 VIEW COMPARISON:  None. FINDINGS: Normal bowel gas pattern. There is dextroscoliosis of the lumbar spine. Right hip arthroplasty and moderate left hip osteoarthrosis. IMPRESSION: Nonobstructed bowel gas pattern. Electronically Signed   By: Ulyses Jarred M.D.   On: 10/09/2018 21:07   Ct Head Wo Contrast  Result Date: 10/09/2018 CLINICAL DATA:  84 year old female with a history of fall EXAM: CT HEAD WITHOUT CONTRAST CT CERVICAL SPINE WITHOUT CONTRAST TECHNIQUE: Multidetector CT imaging of the head and cervical spine was performed following the standard protocol without intravenous contrast. Multiplanar CT image reconstructions of the cervical spine were also generated. COMPARISON:  None. FINDINGS: CT HEAD FINDINGS Brain: No acute intracranial hemorrhage. No midline shift or mass effect. Gray-white differentiation maintained. Unremarkable appearance of the ventricular system. Mild volume loss. Vascular: Calcifications of the intracranial vasculature. Skull: No acute fracture.  No aggressive bone lesion identified. Sinuses/Orbits: Unremarkable  appearance of the orbits. Mastoid air cells clear. No middle ear effusion. No significant sinus disease. Other: None CT CERVICAL SPINE FINDINGS Alignment: Straightening of the normal cervical lordosis, potentially positional. Anterolisthesis of C3 on C4 measuring 3 mm. Facets maintain alignment.  Craniocervical junction is aligned. Skull base and vertebrae: No acute fracture. Skull base is aligned. No aggressive bony lesions. Soft tissues and spinal canal: No canal hematoma. Calcifications of the carotid vasculature. No adenopathy. Disc levels: C2-C3: Mild disc disease without significant foraminal narrowing or canal narrowing. Early uncovertebral joint disease. C3-C4: Left greater than right foraminal narrowing secondary to uncovertebral joint disease and facet disease. C4-C5: Bilateral foraminal narrowing secondary to uncovertebral joint disease and facet spurring. C5-C6: Bilateral foraminal narrowing secondary to uncovertebral joint disease and facet disease. C6-C7: Bilateral foraminal narrowing secondary to uncovertebral joint disease and facet disease. C7-T1: No significant canal narrowing. No significant foraminal narrowing. Upper chest: Negative. Other: None IMPRESSION: Head CT: No acute intracranial abnormality. Mild brain volume loss. Intracranial atherosclerosis. Cervical CT: No acute fracture or malalignment of the cervical spine. Multilevel degenerative changes as above. Electronically Signed   By: Corrie Mckusick D.O.   On: 10/09/2018 17:47   Ct Cervical Spine Wo Contrast  Result Date: 10/09/2018 CLINICAL DATA:  84 year old female with a history of fall EXAM: CT HEAD WITHOUT CONTRAST CT CERVICAL SPINE WITHOUT CONTRAST TECHNIQUE: Multidetector CT imaging of the head and cervical spine was performed following the standard protocol without intravenous contrast. Multiplanar CT image reconstructions of the cervical spine were  also generated. COMPARISON:  None. FINDINGS: CT HEAD FINDINGS Brain: No acute  intracranial hemorrhage. No midline shift or mass effect. Gray-white differentiation maintained. Unremarkable appearance of the ventricular system. Mild volume loss. Vascular: Calcifications of the intracranial vasculature. Skull: No acute fracture.  No aggressive bone lesion identified. Sinuses/Orbits: Unremarkable appearance of the orbits. Mastoid air cells clear. No middle ear effusion. No significant sinus disease. Other: None CT CERVICAL SPINE FINDINGS Alignment: Straightening of the normal cervical lordosis, potentially positional. Anterolisthesis of C3 on C4 measuring 3 mm. Facets maintain alignment.  Craniocervical junction is aligned. Skull base and vertebrae: No acute fracture. Skull base is aligned. No aggressive bony lesions. Soft tissues and spinal canal: No canal hematoma. Calcifications of the carotid vasculature. No adenopathy. Disc levels: C2-C3: Mild disc disease without significant foraminal narrowing or canal narrowing. Early uncovertebral joint disease. C3-C4: Left greater than right foraminal narrowing secondary to uncovertebral joint disease and facet disease. C4-C5: Bilateral foraminal narrowing secondary to uncovertebral joint disease and facet spurring. C5-C6: Bilateral foraminal narrowing secondary to uncovertebral joint disease and facet disease. C6-C7: Bilateral foraminal narrowing secondary to uncovertebral joint disease and facet disease. C7-T1: No significant canal narrowing. No significant foraminal narrowing. Upper chest: Negative. Other: None IMPRESSION: Head CT: No acute intracranial abnormality. Mild brain volume loss. Intracranial atherosclerosis. Cervical CT: No acute fracture or malalignment of the cervical spine. Multilevel degenerative changes as above. Electronically Signed   By: Corrie Mckusick D.O.   On: 10/09/2018 17:47   Dg Hip Unilat W Or Wo Pelvis 2-3 Views Right  Result Date: 10/09/2018 CLINICAL DATA:  Pelvic and RIGHT hip pain, fell earlier today EXAM: DG HIP  (WITH OR WITHOUT PELVIS) 2-3V RIGHT COMPARISON:  No 03/02/2007 FINDINGS: Osseous demineralization. RIGHT hip prosthesis. Degenerative changes LEFT hip joint with significant joint space narrowing. Degenerative disc and facet disease changes at visualized lower lumbar spine. No acute fracture, dislocation, or bone destruction. IMPRESSION: RIGHT hip prosthesis. Osseous demineralization with degenerative changes LEFT hip joint. No acute abnormalities. Electronically Signed   By: Lavonia Dana M.D.   On: 10/09/2018 17:31   EKG Interpretation  Date/Time:  Monday October 09 2018 15:11:40 EST Ventricular Rate:  103 PR Interval:    QRS Duration: 69 QT Interval:  431 QTC Calculation: 565 R Axis:   58 Text Interpretation:  Atrial fibrillation Low voltage, extremity leads Nonspecific T abnormalities, lateral leads Prolonged QT interval No significant change from 12/17/2016 Confirmed by Veryl Speak [phone]) on 10/09/2018 3:43:31 PM   Pending Labs Unresulted Labs (From admission, onward)    Start     Ordered   10/09/18 2037  Blood gas, venous  Once,   R     10/09/18 2036   10/09/18 1550  Urine culture  ONCE - STAT,   STAT     10/09/18 1549   10/09/18 1455  Culture, blood (Routine x 2)  BLOOD CULTURE X 2,   STAT     10/09/18 1454          Vitals/Pain Today's Vitals   10/10/18 1000 10/10/18 1030 10/10/18 1121 10/10/18 1130  BP: 133/79 128/88  119/65  Pulse: 98 (!) 104  84  Resp: (!) 22 (!) 21  19  Temp:      TempSrc:      SpO2: 94% 98%  97%  PainSc:   Asleep     Isolation Precautions Droplet precaution  Medications Medications  sodium chloride flush (NS) 0.9 % injection 3 mL (3 mLs Intravenous Not Given 10/10/18 0954)  0.9 %  sodium chloride infusion (0 mL/hr Intravenous Stopped 10/10/18 0729)  acetaminophen (TYLENOL) tablet 650 mg (has no administration in time range)    Or  acetaminophen (TYLENOL) suppository 650 mg (has no administration in time range)  ondansetron (ZOFRAN)  tablet 4 mg (has no administration in time range)    Or  ondansetron (ZOFRAN) injection 4 mg (has no administration in time range)  predniSONE (DELTASONE) tablet 40 mg (40 mg Oral Given 10/10/18 0754)  levothyroxine (SYNTHROID, LEVOTHROID) tablet 175 mcg (175 mcg Oral Given 10/10/18 0603)  rivaroxaban (XARELTO) tablet 20 mg (20 mg Oral Given 10/10/18 0754)  gabapentin (NEURONTIN) capsule 300 mg (300 mg Oral Given 10/10/18 1008)  acetaminophen (TYLENOL) tablet 650 mg (has no administration in time range)    Or  acetaminophen (TYLENOL) suppository 650 mg (has no administration in time range)  HYDROcodone-acetaminophen (NORCO/VICODIN) 5-325 MG per tablet 1-2 tablet (has no administration in time range)  ondansetron (ZOFRAN) tablet 4 mg (has no administration in time range)    Or  ondansetron (ZOFRAN) injection 4 mg (has no administration in time range)  bisacodyl (DULCOLAX) suppository 10 mg (has no administration in time range)  insulin aspart (novoLOG) injection 0-5 Units (2 Units Subcutaneous Given 10/10/18 0006)  insulin aspart (novoLOG) injection 0-15 Units (3 Units Subcutaneous Given 10/10/18 0907)  levalbuterol (XOPENEX) nebulizer solution 0.63 mg (has no administration in time range)  azithromycin (ZITHROMAX) 500 mg in sodium chloride 0.9 % 250 mL IVPB ( Intravenous Stopped 10/09/18 2258)  aztreonam (AZACTAM) 2 g in sodium chloride 0.9 % 100 mL IVPB (2 g Intravenous New Bag/Given 10/10/18 0800)  vancomycin (VANCOCIN) IVPB 1000 mg/200 mL premix (has no administration in time range)  morphine 2 MG/ML injection 2 mg (2 mg Intravenous Given 10/09/18 2354)  levalbuterol (XOPENEX) nebulizer solution 0.63 mg (0.63 mg Nebulization Given 10/10/18 0738)  sodium chloride 0.9 % bolus 1,000 mL (0 mLs Intravenous Stopped 10/09/18 1718)  acetaminophen (TYLENOL) tablet 650 mg (650 mg Oral Given 10/09/18 1619)  vancomycin (VANCOCIN) IVPB 1000 mg/200 mL premix (0 mg Intravenous Stopped 10/09/18 1739)   aztreonam (AZACTAM) 2 g in sodium chloride 0.9 % 100 mL IVPB (0 g Intravenous Stopped 10/09/18 1900)  ALPRAZolam (XANAX) tablet 0.25 mg (0.25 mg Oral Given 10/09/18 2103)    Mobility non-ambulatory

## 2018-10-10 NOTE — ED Notes (Signed)
Provider notified of blood collect delay and antibiotics

## 2018-10-10 NOTE — Progress Notes (Signed)
PT Cancellation Note  Patient Details Name: [name] MRN: [id number] DOB: 1932/08/16   Cancelled Treatment:    Reason Eval/Treat Not Completed: Medical issues which prohibited therapyper RN, wait until tomorrow.    Claretha Cooper 10/10/2018, 2:23 PM Tresa Endo [place] Pager [phone] Office [phone]

## 2018-10-10 NOTE — Progress Notes (Signed)
PROGRESS NOTE    Debra Campbell  XIP:[id number] DOB: [birth year] DOA: 10/09/2018 PCP: Chevis Pretty, FNP (Confirm with patient/family/NH records and if not entered, this HAS to be entered at [facility] point of entry. "No PCP" if truly none.) Outpatient Specialists: (Specify speciality and name if known)    Brief Narrative: Debra Campbell is 84 y.o patient admitted with SIRS. Has a medical history of type 2 Dm, hip edema, persistent atrial fibrillation, hypothyroidism, diabetic polyneuropathy, morbid obesity. Patient presented to the ED after a fall and been confused than usual. At presentation, she was tachycardic, tachypneic, and febrile and oxygen level was 91%. Chest x ray,abdominal CT, Head CT, and UA done in the ED were all unremarkable. Patient was placed on IV fluids and antibiotics and admitted to the hospital.     Assessment & Plan:   Active Problems:   Essential hypertension   Hypothyroidism   Hyperlipidemia   Diabetic neuropathy (HCC)   Type 2 diabetes mellitus with diabetic polyneuropathy ([place])   Morbid obesity ([place])   Persistent atrial fibrillation   Status post total left knee replacement   Sepsis ([place])   SIRS (systemic inflammatory response syndrome) (HCC)  SIRS: Uncertain ethiology. Patient is still tachypneic but her temperature and heart rate is back in the normal range. Patient is no longer confused. Urine culture and blood culture do not show any growth so far.  Antibiotics discontinued. Continue on IV fluids.   Hypertension: Last reported 118/47. Continue monitoring BP levels.   Hypothyroidism: Continue on levothyroxine 175 mcg po daily.   Type 2 DM: Last reported 187. On sliding scalee. Monitor CBG.   Persistent atrial fibrillation: Continue on rivaroxaban 20 mg.    DVT prophylaxis: Riveroxaban 20 mg.  Code Status: Full Family Communication: Patient's husband and son at bedside. Disposition Plan:    Consultants: No   Procedures:  No  Antimicrobials: None  Subjective: Patient indicated having pain on the left flank. Denied chest pain and dyspnea.    Objective: Vitals:   10/10/18 1130 10/10/18 1210 10/10/18 1244 10/10/18 1300  BP: 119/65 (!) 110/97 133/69 (!) 118/47  Pulse: 84 81 68 91  Resp: 19 (!) 21 19 (!) 21  Temp:  98.4 F (36.9 C)    TempSrc:  Oral    SpO2: 97% 98% 97% 97%  Weight:   104.1 kg   Height:   5\' 4"  (1.626 m)     Intake/Output Summary (Last 24 hours) at 10/10/2018 1416 Last data filed at 10/10/2018 0729 Gross per 24 hour  Intake 2422.6 ml  Output -  Net 2422.6 ml   Filed Weights   10/10/18 1244  Weight: 104.1 kg    Examination:  General exam: Appears calm and comfortable  Respiratory system: Clear to auscultation. Respiratory effort normal. Cardiovascular system: S1 & S2 heard, RRR. No JVD, murmurs, rubs, gallops or clicks. No pedal edema. Gastrointestinal system: Abdomen is nondistended, soft and nontender. No organomegaly or masses felt. Normal bowel sounds heard. Central nervous system: Alert and oriented. No focal neurological deficits. Extremities: Symmetric 5 x 5 power. small lesions/scratches on lower extremities bilaterally due to cellulitis.  Skin: No rashes, lesions or ulcers Psychiatry: Judgement and insight appear normal. Mood & affect appropriate.     Data Reviewed: I have personally reviewed following labs and imaging studies  CBC: Recent Labs  Lab 10/09/18 1455 10/10/18 0514  WBC 6.3 5.6  NEUTROABS 5.4  --   HGB 12.3 11.7*  HCT 38.8 36.4  MCV 99.0 97.3  PLT 111* 98*   Basic Metabolic Panel: Recent Labs  Lab 10/09/18 1455 10/10/18 0514  NA 136 137  K 4.1 4.2  CL 101 104  CO2 24 25  GLUCOSE 231* 187*  BUN 12 15  CREATININE 1.11* 1.02*  CALCIUM 8.4* 7.9*  MG  --  1.6*  PHOS  --  3.0   GFR: Estimated Creatinine Clearance: 48.3 mL/min (A) (by C-G formula based on SCr of 1.02 mg/dL (H)). Liver Function Tests: Recent Labs  Lab  10/09/18 1455 10/10/18 0514  AST 36 43*  ALT 23 23  ALKPHOS 51 46  BILITOT 1.6* 1.2  PROT 7.0 6.3*  ALBUMIN 3.9 3.4*   No results for input(s): LIPASE, AMYLASE in the last 168 hours. No results for input(s): AMMONIA in the last 168 hours. Coagulation Profile: Recent Labs  Lab 10/09/18 1455  INR 1.38   Cardiac Enzymes: Recent Labs  Lab 10/09/18 1502 10/10/18 0058 10/10/18 0514 10/10/18 1001  TROPONINI <0.03 0.04* 0.05* 0.04*   BNP (last 3 results) No results for input(s): PROBNP in the last 8760 hours. HbA1C: Recent Labs    10/10/18 0514  HGBA1C 6.7*   CBG: Recent Labs  Lab 10/09/18 2332 10/10/18 0755 10/10/18 1201  GLUCAP 204* 190* 253*   Lipid Profile: No results for input(s): CHOL, HDL, LDLCALC, TRIG, CHOLHDL, LDLDIRECT in the last 72 hours. Thyroid Function Tests: Recent Labs    10/10/18 0514  TSH 0.799   Anemia Panel: No results for input(s): VITAMINB12, FOLATE, FERRITIN, TIBC, IRON, RETICCTPCT in the last 72 hours. Urine analysis:    Component Value Date/Time   COLORURINE YELLOW 10/09/2018 1619   APPEARANCEUR CLEAR 10/09/2018 1619   LABSPEC 1.019 10/09/2018 1619   PHURINE 6.0 10/09/2018 1619   GLUCOSEU 50 (A) 10/09/2018 1619   HGBUR MODERATE (A) 10/09/2018 1619   BILIRUBINUR NEGATIVE 10/09/2018 1619   KETONESUR 5 (A) 10/09/2018 1619   PROTEINUR 30 (A) 10/09/2018 1619   UROBILINOGEN 0.2 02/07/2012 0829   NITRITE NEGATIVE 10/09/2018 1619   LEUKOCYTESUR NEGATIVE 10/09/2018 1619   Sepsis Labs: @LABRCNTIP (procalcitonin:4,lacticidven:4)  ) Recent Results (from the past 240 hour(s))  Culture, blood (Routine x 2)     Status: None (Preliminary result)   Collection Time: 10/09/18  2:55 PM  Result Value Ref Range Status   Specimen Description   Final    RIGHT ANTECUBITAL Performed at [facility], [place] [address], [place], [place] [address]    Special Requests   Final    BOTTLES DRAWN AEROBIC AND ANAEROBIC Blood Culture  adequate volume Performed at [place] [address] [place], [place] [address]    Culture   Final    NO GROWTH < 24 HOURS Performed at [place] [address], [place], [place] [address]    Report Status PENDING  Incomplete  Culture, blood (Routine x 2)     Status: None (Preliminary result)   Collection Time: 10/09/18  3:00 PM  Result Value Ref Range Status   Specimen Description   Final    LEFT ANTECUBITAL Performed at [place] [address], [place], [place] [address]    Special Requests   Final    BOTTLES DRAWN AEROBIC AND ANAEROBIC Blood Culture results may not be optimal due to an excessive volume of blood received in culture bottles Performed at [place] [address], [place], [place] [address]    Culture   Final    NO GROWTH <  24 HOURS Performed at [facility], [place] [address] [place], [place] [address]    Report Status PENDING  Incomplete  Urine culture     Status: None   Collection Time: 10/09/18  4:19 PM  Result Value Ref Range Status   Specimen Description   Final    URINE, CATHETERIZED Performed at [place] [address] [place], [place] [address]    Special Requests   Final    NONE Performed at [facility], [place] [address], [place], [place] [address]    Culture   Final    NO GROWTH Performed at [facility], [place] [address] [place], [place] [address]    Report Status 10/10/2018 FINAL  Final  Respiratory Panel by PCR     Status: None   Collection Time: 10/09/18  7:01 PM  Result Value Ref Range Status   Adenovirus NOT DETECTED NOT DETECTED Final   Coronavirus 229E NOT DETECTED NOT DETECTED Final   Coronavirus HKU1 NOT DETECTED NOT DETECTED Final   Coronavirus NL63 NOT DETECTED NOT DETECTED Final   Coronavirus OC43 NOT DETECTED NOT DETECTED Final   Metapneumovirus NOT DETECTED NOT DETECTED Final   Rhinovirus /  Enterovirus NOT DETECTED NOT DETECTED Final   Influenza A NOT DETECTED NOT DETECTED Final   Influenza B NOT DETECTED NOT DETECTED Final   Parainfluenza Virus 1 NOT DETECTED NOT DETECTED Final   Parainfluenza Virus 2 NOT DETECTED NOT DETECTED Final   Parainfluenza Virus 3 NOT DETECTED NOT DETECTED Final   Parainfluenza Virus 4 NOT DETECTED NOT DETECTED Final   Respiratory Syncytial Virus NOT DETECTED NOT DETECTED Final   Bordetella pertussis NOT DETECTED NOT DETECTED Final   Chlamydophila pneumoniae NOT DETECTED NOT DETECTED Final   Mycoplasma pneumoniae NOT DETECTED NOT DETECTED Final    Comment: Performed at [facility], [place] [address] [place], [place] [address]         Radiology Studies: Dg Chest 2 View  Result Date: 10/09/2018 CLINICAL DATA:  Shortness of breath and possible sepsis. EXAM: CHEST - 2 VIEW COMPARISON:  12/17/2016 FINDINGS: Cardiomegaly and mild pulmonary vascular congestion noted. Mild chronic peribronchial thickening again noted. There is no evidence of focal airspace disease, pulmonary edema, suspicious pulmonary nodule/mass, pleural effusion, or pneumothorax. No acute bony abnormalities are identified. IMPRESSION: Cardiomegaly with mild pulmonary vascular congestion and mild chronic peribronchial thickening. Electronically Signed   By: Margarette Canada M.D.   On: 10/09/2018 16:16   Dg Abd 1 View  Result Date: 10/09/2018 CLINICAL DATA:  Abdominal distension EXAM: ABDOMEN - 1 VIEW COMPARISON:  None. FINDINGS: Normal bowel gas pattern. There is dextroscoliosis of the lumbar spine. Right hip arthroplasty and moderate left hip osteoarthrosis. IMPRESSION: Nonobstructed bowel gas pattern. Electronically Signed   By: Ulyses Jarred M.D.   On: 10/09/2018 21:07   Ct Head Wo Contrast  Result Date: 10/09/2018 CLINICAL DATA:  84 year old female with a history of fall EXAM: CT HEAD WITHOUT CONTRAST CT CERVICAL SPINE WITHOUT CONTRAST TECHNIQUE: Multidetector CT imaging of the  head and cervical spine was performed following the standard protocol without intravenous contrast. Multiplanar CT image reconstructions of the cervical spine were also generated. COMPARISON:  None. FINDINGS: CT HEAD FINDINGS Brain: No acute intracranial hemorrhage. No midline shift or mass effect. Gray-white differentiation maintained. Unremarkable appearance of the ventricular system. Mild volume loss. Vascular: Calcifications of the intracranial vasculature. Skull: No acute fracture.  No aggressive bone lesion identified. Sinuses/Orbits: Unremarkable appearance  of the orbits. Mastoid air cells clear. No middle ear effusion. No significant sinus disease. Other: None CT CERVICAL SPINE FINDINGS Alignment: Straightening of the normal cervical lordosis, potentially positional. Anterolisthesis of C3 on C4 measuring 3 mm. Facets maintain alignment.  Craniocervical junction is aligned. Skull base and vertebrae: No acute fracture. Skull base is aligned. No aggressive bony lesions. Soft tissues and spinal canal: No canal hematoma. Calcifications of the carotid vasculature. No adenopathy. Disc levels: C2-C3: Mild disc disease without significant foraminal narrowing or canal narrowing. Early uncovertebral joint disease. C3-C4: Left greater than right foraminal narrowing secondary to uncovertebral joint disease and facet disease. C4-C5: Bilateral foraminal narrowing secondary to uncovertebral joint disease and facet spurring. C5-C6: Bilateral foraminal narrowing secondary to uncovertebral joint disease and facet disease. C6-C7: Bilateral foraminal narrowing secondary to uncovertebral joint disease and facet disease. C7-T1: No significant canal narrowing. No significant foraminal narrowing. Upper chest: Negative. Other: None IMPRESSION: Head CT: No acute intracranial abnormality. Mild brain volume loss. Intracranial atherosclerosis. Cervical CT: No acute fracture or malalignment of the cervical spine. Multilevel degenerative  changes as above. Electronically Signed   By: Corrie Mckusick D.O.   On: 10/09/2018 17:47   Ct Cervical Spine Wo Contrast  Result Date: 10/09/2018 CLINICAL DATA:  84 year old female with a history of fall EXAM: CT HEAD WITHOUT CONTRAST CT CERVICAL SPINE WITHOUT CONTRAST TECHNIQUE: Multidetector CT imaging of the head and cervical spine was performed following the standard protocol without intravenous contrast. Multiplanar CT image reconstructions of the cervical spine were also generated. COMPARISON:  None. FINDINGS: CT HEAD FINDINGS Brain: No acute intracranial hemorrhage. No midline shift or mass effect. Gray-white differentiation maintained. Unremarkable appearance of the ventricular system. Mild volume loss. Vascular: Calcifications of the intracranial vasculature. Skull: No acute fracture.  No aggressive bone lesion identified. Sinuses/Orbits: Unremarkable appearance of the orbits. Mastoid air cells clear. No middle ear effusion. No significant sinus disease. Other: None CT CERVICAL SPINE FINDINGS Alignment: Straightening of the normal cervical lordosis, potentially positional. Anterolisthesis of C3 on C4 measuring 3 mm. Facets maintain alignment.  Craniocervical junction is aligned. Skull base and vertebrae: No acute fracture. Skull base is aligned. No aggressive bony lesions. Soft tissues and spinal canal: No canal hematoma. Calcifications of the carotid vasculature. No adenopathy. Disc levels: C2-C3: Mild disc disease without significant foraminal narrowing or canal narrowing. Early uncovertebral joint disease. C3-C4: Left greater than right foraminal narrowing secondary to uncovertebral joint disease and facet disease. C4-C5: Bilateral foraminal narrowing secondary to uncovertebral joint disease and facet spurring. C5-C6: Bilateral foraminal narrowing secondary to uncovertebral joint disease and facet disease. C6-C7: Bilateral foraminal narrowing secondary to uncovertebral joint disease and facet  disease. C7-T1: No significant canal narrowing. No significant foraminal narrowing. Upper chest: Negative. Other: None IMPRESSION: Head CT: No acute intracranial abnormality. Mild brain volume loss. Intracranial atherosclerosis. Cervical CT: No acute fracture or malalignment of the cervical spine. Multilevel degenerative changes as above. Electronically Signed   By: Corrie Mckusick D.O.   On: 10/09/2018 17:47   Dg Hip Unilat W Or Wo Pelvis 2-3 Views Right  Result Date: 10/09/2018 CLINICAL DATA:  Pelvic and RIGHT hip pain, fell earlier today EXAM: DG HIP (WITH OR WITHOUT PELVIS) 2-3V RIGHT COMPARISON:  No 03/02/2007 FINDINGS: Osseous demineralization. RIGHT hip prosthesis. Degenerative changes LEFT hip joint with significant joint space narrowing. Degenerative disc and facet disease changes at visualized lower lumbar spine. No acute fracture, dislocation, or bone destruction. IMPRESSION: RIGHT hip prosthesis. Osseous demineralization with degenerative changes LEFT hip joint.  No acute abnormalities. Electronically Signed   By: Lavonia Dana M.D.   On: 10/09/2018 17:31        Scheduled Meds: . gabapentin  300 mg Oral BID  . insulin aspart  0-15 Units Subcutaneous TID WC  . insulin aspart  0-5 Units Subcutaneous QHS  . levalbuterol  0.63 mg Nebulization TID  . levothyroxine  175 mcg Oral Q0600  . predniSONE  40 mg Oral Q breakfast  . rivaroxaban  20 mg Oral Q breakfast  . sodium chloride flush  3 mL Intravenous Q12H   Continuous Infusions: . azithromycin Stopped (10/09/18 2258)  . aztreonam 2 g (10/10/18 0800)  . vancomycin 750 mg (10/10/18 1359)     LOS: 1 day    Time spent:     Sterling Big, MD [facility] Pager [phone]  If 7PM-7AM, please contact night-coverage [email or web address] Password [email or web address] 10/10/2018, 2:16 PM

## 2018-10-10 NOTE — Progress Notes (Signed)
BiPAP remains ordered PRN, currently not in room.

## 2018-10-10 NOTE — Progress Notes (Addendum)
Pharmacy Antibiotic Note  Debra Campbell is a 84 y.o. female admitted on 10/09/2018 with sepsis.  Pharmacy has been consulted for Vancomycin and Aztreonam dosing. Penicillin allergy with reaction of swelling, rash noted.  No documentation that patient has tolerated cephalosporins in the past.   10/10/2018:  Tmax 103.25F rectally  No leukocytosis, LA now WNL.  PCT 2.33  SCr improved to 1.02  CXR: Cardiomegaly with mild pulmonary vascular congestion and mild chronic peribronchial thickening  UA without evidence of UTI   Plan:  Adjust Vancomycin to 750mg  IV q24h with updated weight, SCr for target AUC 400-500  Continue Aztreonam 2g IV q8h  Azithromycin 500mg  IV q24h per MD  Monitor renal function, cultures, clinical course.    Height: 5\' 4"  (162.6 cm) Weight: 229 lb 8 oz (104.1 kg) IBW/kg (Calculated) : 54.7  Temp (24hrs), Avg:100.8 F (38.2 C), Min:98.4 F (36.9 C), Max:103.2 F (39.6 C)  Recent Labs  Lab 10/09/18 1455 10/09/18 1516 10/09/18 1755 10/10/18 0058 10/10/18 0514  WBC 6.3  --   --   --  5.6  CREATININE 1.11*  --   --   --  1.02*  LATICACIDVEN  --  2.24* 1.59 1.8 1.8    Estimated Creatinine Clearance: 48.3 mL/min (A) (by C-G formula based on SCr of 1.02 mg/dL (H)).    Allergies  Allergen Reactions  . Ace Inhibitors Cough  . Iohexol      Desc: HIVES 40 YEARS AGO   . Statins     myalgia  . Penicillins Swelling and Rash    Has patient had a PCN reaction causing immediate rash, facial/tongue/throat swelling, SOB or lightheadedness with hypotension: Yes Has patient had a PCN reaction causing severe rash involving mucus membranes or skin necrosis: No Has patient had a PCN reaction that required hospitalization Yes Has patient had a PCN reaction occurring within the last 10 years: No If all of the above answers are "NO", then may proceed with Cephalosporin use.   . Sulfa Antibiotics Rash    Antimicrobials this admission: 11/11 Aztreonam >>  11/11  Vancomycin >>  11/11 Azithromycin >>  Microbiology results: 11/11 BCx: NGTD  11/11 UCx: sent 11/11 Resp PCR: neg  11/11 Influenza PCR: neg 11/12 MRSA PCR: sent   Thank you for allowing pharmacy to be a part of this patient's care.    Lindell Spar, PharmD, BCPS Pager: [phone] 10/10/2018 12:49 PM

## 2018-10-10 NOTE — Progress Notes (Signed)
Nutrition Brief Note  Consult received for assessment of nutrition requirements and status.   Wt Readings from Last 15 Encounters:  10/10/18 104.1 kg  08/09/18 106.5 kg  07/25/18 103.4 kg  04/13/18 103.9 kg  04/11/18 104.5 kg  01/17/18 102.3 kg  01/13/18 103.4 kg  10/26/17 100.7 kg  10/12/17 103.9 kg  10/06/17 103.4 kg  07/19/17 100.7 kg  07/07/17 101.2 kg  07/04/17 102.5 kg  04/15/17 100.2 kg  03/29/17 101.6 kg    Body mass index is 39.39 kg/m. Patient meets criteria for obesity based on current BMI. Weight has been relatively stable since 03/2017. Skin WDL.   Current diet order is Carb Modified. Patient reports that she received 2 lunch trays d/t timing of moving from the ED to her current room. She reports eating some of each item on her first tray, totaling 50-75% of the meal. RD visualized second tray, which was untouched. She reports good appetite at home. She denies any nausea now or PTA and reports LLQ pain which is only worsened with coughing, not worsened by eating or drinking or even by movement.    Labs and medications reviewed.   No nutrition interventions warranted at this time. If nutrition issues arise, please re-consult RD.     Jarome Matin, MS, RD, LDN, [facility] Inpatient Clinical Dietitian Pager # [phone] After hours/weekend pager # [phone]

## 2018-10-10 NOTE — ED Notes (Signed)
Bladder scan volume: 58mL

## 2018-10-10 NOTE — ED Notes (Signed)
Provider notified of critical labs.

## 2018-10-10 NOTE — Progress Notes (Signed)
PROGRESS NOTE    Debra Campbell  ACZ:[id number] DOB: 09/16/1932 DOA: 10/09/2018 PCP: Chevis Pretty, FNP    Brief Narrative:  84 year old female presented with fevers.  She does have significant past medical history for type 2 diabetes mellitus, persistent atrial fibrillation, hypothyroidism, diabetic neuropathy and morbid obesity.  Patient was found to be disoriented at home and febrile.  On the initial physical examination blood pressure 134/110, heart rate 95, respiratory 20, temperature 103.2 F oxygen saturation 91%.  Dry mucous membranes, lungs wheezing bilaterally, no rales, heart S1-S2 present and rhythmic, no gallops rubs or murmurs, abdomen protuberant, soft nondistended, lower extremities with trace edema, neurologically patient was nonfocal.  Sodium 136, potassium 4.1, chloride 101, bicarb 24, glucose 231, BUN 12, creatinine 1.11, white count 6.3, hemoglobin 12.3, hematocrit 38.8, platelets 111, influenza and respiratory panel negative, urinalysis negative for infection.  Head and cervical spine CT negative.  Chest radiograph, with increased interstitial markings bilaterally.  EKG with atrial fibrillation, normal axis.  Patient was admitted to the hospital with working diagnosis of systemic inflammatory response syndrome complicated by metabolic encephalopathy.   Assessment & Plan:   Active Problems:   Essential hypertension   Hypothyroidism   Hyperlipidemia   Diabetic neuropathy (HCC)   Type 2 diabetes mellitus with diabetic polyneuropathy ([place])   Morbid obesity ([place])   Persistent atrial fibrillation   Status post total left knee replacement   Sepsis ([place])   SIRS (systemic inflammatory response syndrome) ([place])  1.  Systemic inflammatory response syndrome.  Extensive work-up has been negative for deep infection, will hold on antibiotic therapy, continue hydration with isotonic fluid.  Follow-up on cell count, cultures and temperature curve. Continue telemetry  monitoring for now.  2. Metabolic encephalopathy. Clinically has improved, no signs of meningitis or encephalitis, continue neuro checks per unit protocol and physical therapy evaluation.   3. HTN. Continue holding antihypertensive medications.   4. Chronic atrial fibrillation. Continue anticoagulation with apixaban, will resume metoprolol for rate control.   5. T2DM. Continue insulin sliding scale for glucose cover and monitoring.   6. Hypothyroid. Continue levothyroxine.   7. Morbid obesity. Check BMI, will need follow up as outpatient.     DVT prophylaxis: apixaban   Code Status:  full Family Communication: I spoke with patient's family at the bedside and all questions were addressed.  Disposition Plan/ discharge barriers: pending clinical improvement.   There is no height or weight on file to calculate BMI. Malnutrition Type:      Malnutrition Characteristics:      Nutrition Interventions:     RN Pressure Injury Documentation:     Consultants:     Procedures:     Antimicrobials:       Subjective: Patient is feeling better, but not back to baseline, no nausea or vomiting, no chest pain or dsypnea.   Objective: Vitals:   10/10/18 1000 10/10/18 1030 10/10/18 1130 10/10/18 1210  BP: 133/79 128/88 119/65 (!) 110/97  Pulse: 98 (!) 104 84 81  Resp: (!) 22 (!) 21 19 (!) 21  Temp:    98.4 F (36.9 C)  TempSrc:    Oral  SpO2: 94% 98% 97% 98%    Intake/Output Summary (Last 24 hours) at 10/10/2018 1240 Last data filed at 10/10/2018 1093 Gross per 24 hour  Intake 2422.6 ml  Output -  Net 2422.6 ml   There were no vitals filed for this visit.  Examination:   General: deconditioned  Neurology: Awake and alert, non focal  E  ENT: mild pallor, no icterus, oral mucosa moist Cardiovascular: No JVD. S1-S2 present, rhythmic, no gallops, rubs, or murmurs. No lower extremity edema. Pulmonary: positive breath sounds bilaterally, adequate air movement, no  wheezing, rhonchi or rales. Gastrointestinal. Abdomen with no organomegaly, non tender, no rebound or guarding Skin. No rashes Musculoskeletal: no joint deformities     Data Reviewed: I have personally reviewed following labs and imaging studies  CBC: Recent Labs  Lab 10/09/18 1455 10/10/18 0514  WBC 6.3 5.6  NEUTROABS 5.4  --   HGB 12.3 11.7*  HCT 38.8 36.4  MCV 99.0 97.3  PLT 111* 98*   Basic Metabolic Panel: Recent Labs  Lab 10/09/18 1455 10/10/18 0514  NA 136 137  K 4.1 4.2  CL 101 104  CO2 24 25  GLUCOSE 231* 187*  BUN 12 15  CREATININE 1.11* 1.02*  CALCIUM 8.4* 7.9*  MG  --  1.6*  PHOS  --  3.0   GFR: CrCl cannot be calculated (Unknown ideal weight.). Liver Function Tests: Recent Labs  Lab 10/09/18 1455 10/10/18 0514  AST 36 43*  ALT 23 23  ALKPHOS 51 46  BILITOT 1.6* 1.2  PROT 7.0 6.3*  ALBUMIN 3.9 3.4*   No results for input(s): LIPASE, AMYLASE in the last 168 hours. No results for input(s): AMMONIA in the last 168 hours. Coagulation Profile: Recent Labs  Lab 10/09/18 1455  INR 1.38   Cardiac Enzymes: Recent Labs  Lab 10/09/18 1502 10/10/18 0058 10/10/18 0514 10/10/18 1001  TROPONINI <0.03 0.04* 0.05* 0.04*   BNP (last 3 results) No results for input(s): PROBNP in the last 8760 hours. HbA1C: Recent Labs    10/10/18 0514  HGBA1C 6.7*   CBG: Recent Labs  Lab 10/09/18 2332 10/10/18 0755 10/10/18 1201  GLUCAP 204* 190* 253*   Lipid Profile: No results for input(s): CHOL, HDL, LDLCALC, TRIG, CHOLHDL, LDLDIRECT in the last 72 hours. Thyroid Function Tests: Recent Labs    10/10/18 0514  TSH 0.799   Anemia Panel: No results for input(s): VITAMINB12, FOLATE, FERRITIN, TIBC, IRON, RETICCTPCT in the last 72 hours.    Radiology Studies: I have reviewed all of the imaging during this hospital visit personally     Scheduled Meds: . gabapentin  300 mg Oral BID  . insulin aspart  0-15 Units Subcutaneous TID WC  .  insulin aspart  0-5 Units Subcutaneous QHS  . levalbuterol  0.63 mg Nebulization TID  . levothyroxine  175 mcg Oral Q0600  . predniSONE  40 mg Oral Q breakfast  . rivaroxaban  20 mg Oral Q breakfast  . sodium chloride flush  3 mL Intravenous Q12H   Continuous Infusions: . azithromycin Stopped (10/09/18 2258)  . aztreonam 2 g (10/10/18 0800)  . vancomycin       LOS: 1 day        Tawni Millers, MD [facility] Pager [phone]

## 2018-10-10 NOTE — Progress Notes (Signed)
OT Cancellation Note  Patient Details Name: Debra Campbell MRN: [id number] DOB: 01-Sep-1932   Cancelled Treatment:    Reason Eval/Treat Not Completed: Other (comment).  Not ready for OT today. Will check back tomorrow  Shantele Reller 10/10/2018, 1:55 PM  Lesle Chris, OTR/L Acute Rehabilitation Services [phone] [facility] pager [phone] office 10/10/2018

## 2018-10-11 DIAGNOSIS — I4819 Other persistent atrial fibrillation: Secondary | ICD-10-CM

## 2018-10-11 DIAGNOSIS — E119 Type 2 diabetes mellitus without complications: Secondary | ICD-10-CM

## 2018-10-11 DIAGNOSIS — J9601 Acute respiratory failure with hypoxia: Secondary | ICD-10-CM

## 2018-10-11 DIAGNOSIS — I5033 Acute on chronic diastolic (congestive) heart failure: Secondary | ICD-10-CM

## 2018-10-11 LAB — BASIC METABOLIC PANEL
ANION GAP: 8 (ref 5–15)
BUN: 16 mg/dL (ref 8–23)
CALCIUM: 8.1 mg/dL — AB (ref 8.9–10.3)
CO2: 22 mmol/L (ref 22–32)
Chloride: 105 mmol/L (ref 98–111)
Creatinine, Ser: 0.78 mg/dL (ref 0.44–1.00)
GFR calc non Af Amer: >60 mL/min (ref >60)
Glucose, Bld: 258 mg/dL — ABNORMAL HIGH (ref 70–99)
Potassium: 4.6 mmol/L (ref 3.5–5.1)
Sodium: 135 mmol/L (ref 135–145)

## 2018-10-11 LAB — CBC WITH DIFFERENTIAL/PLATELET
Abs Immature Granulocytes: 0.01 10*3/uL (ref 0.00–0.07)
BASOS PCT: 0 %
Basophils Absolute: 0 10*3/uL (ref 0.0–0.1)
EOS ABS: 0 10*3/uL (ref 0.0–0.5)
Eosinophils Relative: 0 %
HCT: 33.8 % — ABNORMAL LOW (ref 36.0–46.0)
Hemoglobin: 10.6 g/dL — ABNORMAL LOW (ref 12.0–15.0)
IMMATURE GRANULOCYTES: 0 %
LYMPHS ABS: 0.7 10*3/uL (ref 0.7–4.0)
Lymphocytes Relative: 15 %
MCH: 30.5 pg (ref 26.0–34.0)
MCHC: 31.4 g/dL (ref 30.0–36.0)
MCV: 97.4 fL (ref 80.0–100.0)
Monocytes Absolute: 0.5 10*3/uL (ref 0.1–1.0)
Monocytes Relative: 11 %
NRBC: 0 % (ref 0.0–0.2)
Neutro Abs: 3.6 10*3/uL (ref 1.7–7.7)
Neutrophils Relative %: 74 %
Platelets: 85 10*3/uL — ABNORMAL LOW (ref 150–400)
RBC: 3.47 MIL/uL — ABNORMAL LOW (ref 3.87–5.11)
RDW: 13.6 % (ref 11.5–15.5)
WBC: 4.9 10*3/uL (ref 4.0–10.5)

## 2018-10-11 LAB — BLOOD GAS, VENOUS
Acid-base deficit: 1.9 mmol/L (ref 0.0–2.0)
BICARBONATE: 24.5 mmol/L (ref 20.0–28.0)
O2 SAT: 27.3 %
PCO2 VEN: 50.6 mmHg (ref 44.0–60.0)
Patient temperature: 98.6
pH, Ven: 7.305 (ref 7.250–7.430)

## 2018-10-11 LAB — GLUCOSE, CAPILLARY
Glucose-Capillary: 182 mg/dL — ABNORMAL HIGH (ref 70–99)
Glucose-Capillary: 207 mg/dL — ABNORMAL HIGH (ref 70–99)

## 2018-10-11 MED ORDER — GUAIFENESIN ER 600 MG PO TB12
600.0000 mg | ORAL_TABLET | Freq: Two times a day (BID) | ORAL | Status: DC
  Administered 2018-10-11 – 2018-10-13 (×5): 600 mg via ORAL
  Filled 2018-10-11 (×5): qty 1

## 2018-10-11 MED ORDER — LEVALBUTEROL HCL 0.63 MG/3ML IN NEBU
0.6300 mg | INHALATION_SOLUTION | Freq: Two times a day (BID) | RESPIRATORY_TRACT | Status: DC
  Administered 2018-10-11: 0.63 mg via RESPIRATORY_TRACT
  Filled 2018-10-11: qty 3

## 2018-10-11 MED ORDER — FUROSEMIDE 40 MG PO TABS
40.0000 mg | ORAL_TABLET | Freq: Two times a day (BID) | ORAL | Status: DC
  Administered 2018-10-11 – 2018-10-13 (×5): 40 mg via ORAL
  Filled 2018-10-11 (×5): qty 1

## 2018-10-11 NOTE — Progress Notes (Signed)
PROGRESS NOTE  Debra Campbell OFB:[id number] DOB: 07-21-1932 DOA: 10/09/2018 PCP: Chevis Pretty, FNP  Brief summary:  patient originally fell in the morning EMS was called she refused transfer.  Few hours later family called in because patient was disoriented Patient is a low-grade fever 100.4 heart rate 100 satting 91% on room air she was started on 2 L and improved to 90% EMS gave 900 ml  NS  While in ER: Came more febrile with temperature up to 103.2   HPI/Recap of past 24 hours:  Report left sided rib cage pain when cough. On 2liter oxygen, ( baseline not o2 dependent) Bilateral lower extremity edema improving, no fever,  Has a good night,  Niece at bedside  Assessment/Plan: Active Problems:   Essential hypertension   Hypothyroidism   Hyperlipidemia   Diabetic neuropathy (HCC)   Type 2 diabetes mellitus with diabetic polyneuropathy (HCC)   Morbid obesity (HCC)   Persistent atrial fibrillation   Status post total left knee replacement   Sepsis ([place])   SIRS (systemic inflammatory response syndrome) (HCC)    Acute metabolic Encephalopathy:  CT head no acute findings Resolved, no aaox3  Fever on presentation: Respiratory viral panel negative, mrsa screening negative, blood culture/urine culture no growth No leukocytosis, but does has lactic acidosis initially cxr with peribronchial thickening She received vanc/aztreonam /zithromax since admission to 11/12 She is currently monitored off abx   Acute on chronic diastolic chf exacerbation, acute hypoxic respiratory failure: Last echo in 2017 lvef wnl Patient reports progressive bilateral lower extremity edema, she has been on lasix at home Cxr: Cardiomegaly with mild pulmonary vascular congestion and mild chronic peribronchial thickening bnp elevated at 357 on presentation Troponin mild and flat at 0.04 to 0.05 ekg with chronic afib, will get echocardiogram, d/c ivf, restart lasix, wean o2 "Pt ambulated  40' with RW, HR 110, dyspnea 2/4, SaO2 86% on room air walking, 92% on room air after 1 minute seated rest and pursed lip breathing" Strict intake and output, daily weight She reports has been sleeping in recliner for yrs due to hip/knee issues  Chronic afib Rate controlled on toprol-xl, continue on xarelto   noninsulin dependent dm2, well controlled a1c 6.7 Home meds metformin held in the hospital, plan to resume at discharge Currently on ssi  Thrombocytopenia: Unclear etiology Check liver function, b12. folate Monitor  CKDII Cr on presentation 1.11, today 0.78 Monitor renal function, renal dosing meds  Anemia of chronic disease hgb stable around 11.  Hypothyroid. Continue levothyroxine tsh 0.79  Obesity: Body mass index is 39.39 kg/m.  Will benefit from outpatient sleep study  Fall at home Fall precaution, PT eval  Code Status: full  Family Communication: patient and neice  Disposition Plan: transfer out of stepdown unit to med tele possible home in 1-2 days, need to  wean o2, monitor plt, check echo   Consultants:  none  Procedures:  none  Antibiotics:  As above   Objective: BP (!) 148/85   Pulse 84   Temp 98.8 F (37.1 C) (Oral)   Resp 13   Ht 5\' 4"  (1.626 m)   Wt 104.1 kg   SpO2 98%   BMI 39.39 kg/m   Intake/Output Summary (Last 24 hours) at 10/11/2018 0835 Last data filed at 10/11/2018 0605 Gross per 24 hour  Intake 1781.25 ml  Output 540 ml  Net 1241.25 ml   Filed Weights   10/10/18 1244  Weight: 104.1 kg    Exam: Patient is examined daily  including today on 10/11/2018, exams remain the same as of yesterday except that has changed    General:  NAD. Aaox3, obese  Cardiovascular: IRRR  Respiratory: CTABL  Abdomen: Soft/ND/NT, positive BS  Musculoskeletal: + bilateral lower extremity pitting Edema, does not appear having cellulitis  Neuro: alert, oriented   Data Reviewed: Basic Metabolic Panel: Recent Labs  Lab  10/09/18 1455 10/10/18 0514 10/11/18 0303  NA 136 137 135  K 4.1 4.2 4.6  CL 101 104 105  CO2 24 25 22   GLUCOSE 231* 187* 258*  BUN 12 15 16   CREATININE 1.11* 1.02* 0.78  CALCIUM 8.4* 7.9* 8.1*  MG  --  1.6*  --   PHOS  --  3.0  --    Liver Function Tests: Recent Labs  Lab 10/09/18 1455 10/10/18 0514  AST 36 43*  ALT 23 23  ALKPHOS 51 46  BILITOT 1.6* 1.2  PROT 7.0 6.3*  ALBUMIN 3.9 3.4*   No results for input(s): LIPASE, AMYLASE in the last 168 hours. No results for input(s): AMMONIA in the last 168 hours. CBC: Recent Labs  Lab 10/09/18 1455 10/10/18 0514 10/11/18 0303  WBC 6.3 5.6 4.9  NEUTROABS 5.4  --  3.6  HGB 12.3 11.7* 10.6*  HCT 38.8 36.4 33.8*  MCV 99.0 97.3 97.4  PLT 111* 98* 85*   Cardiac Enzymes:   Recent Labs  Lab 10/09/18 1502 10/10/18 0058 10/10/18 0514 10/10/18 1001  TROPONINI <0.03 0.04* 0.05* 0.04*   BNP (last 3 results) Recent Labs    10/09/18 1502  BNP 357.2*    ProBNP (last 3 results) No results for input(s): PROBNP in the last 8760 hours.  CBG: Recent Labs  Lab 10/10/18 0755 10/10/18 1201 10/10/18 1610 10/10/18 2056 10/11/18 0745  GLUCAP 190* 253* 270* 196* 182*    Recent Results (from the past 240 hour(s))  Culture, blood (Routine x 2)     Status: None (Preliminary result)   Collection Time: 10/09/18  2:55 PM  Result Value Ref Range Status   Specimen Description   Final    RIGHT ANTECUBITAL Performed at [facility], [place] [address], [place], [place] [address]    Special Requests   Final    BOTTLES DRAWN AEROBIC AND ANAEROBIC Blood Culture adequate volume Performed at [facility] [address], [place], [place] [address]    Culture   Final    NO GROWTH < 24 HOURS Performed at [facility] [address], [place], [place] [address]    Report Status PENDING  Incomplete  Culture, blood (Routine x 2)     Status: None (Preliminary result)   Collection Time:  10/09/18  3:00 PM  Result Value Ref Range Status   Specimen Description   Final    LEFT ANTECUBITAL Performed at [facility] [address] [place], [place] [address]    Special Requests   Final    BOTTLES DRAWN AEROBIC AND ANAEROBIC Blood Culture results may not be optimal due to an excessive volume of blood received in culture bottles Performed at [facility] [address], [place], [place] [address]    Culture   Final    NO GROWTH < 24 HOURS Performed at [facility] [address], [place], [place] [address]    Report Status PENDING  Incomplete  Urine culture     Status: None   Collection Time: 10/09/18  4:19 PM  Result Value Ref  Range Status   Specimen Description   Final    URINE, CATHETERIZED Performed at [facility] [address] [place], [place] [address]    Special Requests   Final    NONE Performed at [facility], [place] [address], [place], [place] [address]    Culture   Final    NO GROWTH Performed at [facility], [place] [address] [place], [place] [address]    Report Status 10/10/2018 FINAL  Final  Respiratory Panel by PCR     Status: None   Collection Time: 10/09/18  7:01 PM  Result Value Ref Range Status   Adenovirus NOT DETECTED NOT DETECTED Final   Coronavirus 229E NOT DETECTED NOT DETECTED Final   Coronavirus HKU1 NOT DETECTED NOT DETECTED Final   Coronavirus NL63 NOT DETECTED NOT DETECTED Final   Coronavirus OC43 NOT DETECTED NOT DETECTED Final   Metapneumovirus NOT DETECTED NOT DETECTED Final   Rhinovirus / Enterovirus NOT DETECTED NOT DETECTED Final   Influenza A NOT DETECTED NOT DETECTED Final   Influenza B NOT DETECTED NOT DETECTED Final   Parainfluenza Virus 1 NOT DETECTED NOT DETECTED Final   Parainfluenza Virus 2 NOT DETECTED NOT DETECTED Final   Parainfluenza Virus 3 NOT DETECTED NOT DETECTED Final   Parainfluenza Virus 4 NOT DETECTED NOT DETECTED  Final   Respiratory Syncytial Virus NOT DETECTED NOT DETECTED Final   Bordetella pertussis NOT DETECTED NOT DETECTED Final   Chlamydophila pneumoniae NOT DETECTED NOT DETECTED Final   Mycoplasma pneumoniae NOT DETECTED NOT DETECTED Final    Comment: Performed at [facility], [place] [address], [place], [place] [address]  MRSA PCR Screening     Status: None   Collection Time: 10/10/18 12:39 PM  Result Value Ref Range Status   MRSA by PCR NEGATIVE NEGATIVE Final    Comment:        The GeneXpert MRSA Assay (FDA approved for NASAL specimens only), is one component of a comprehensive MRSA colonization surveillance program. It is not intended to diagnose MRSA infection nor to guide or monitor treatment for MRSA infections. Performed at [facility], [place] [address], [place], [place] [address]      Studies: No results found.  Scheduled Meds: . furosemide  40 mg Oral BID  . gabapentin  300 mg Oral BID  . guaiFENesin  600 mg Oral BID  . insulin aspart  0-15 Units Subcutaneous TID WC  . insulin aspart  0-5 Units Subcutaneous QHS  . levalbuterol  0.63 mg Nebulization BID  . levothyroxine  175 mcg Oral Q0600  . metoprolol succinate  25 mg Oral Daily  . rivaroxaban  20 mg Oral Q breakfast  . sodium chloride flush  3 mL Intravenous Q12H    Continuous Infusions:   Time spent: 68mins I have personally reviewed and interpreted on  10/11/2018 daily labs, tele strips, imagings as discussed above under date review session and assessment and plans.  I reviewed all nursing notes, pharmacy notes,   vitals, pertinent old records  I have discussed plan of care as described above with RN , patient and family on 10/11/2018   Florencia Reasons MD, PhD  [facility] Pager [phone]. If 7PM-7AM, please contact night-coverage at [email or web address], password [facility] 10/11/2018, 8:35 AM  LOS: 2 days

## 2018-10-11 NOTE — Evaluation (Signed)
Physical Therapy Evaluation Patient Details Name: Debra Campbell MRN: [id number] DOB: 28-Aug-1932 Today's Date: 10/11/2018   History of Present Illness  84 y.o patient admitted with SIRS. Has a medical history of type 2 Dm, hip edema, persistent atrial fibrillation, hypothyroidism, diabetic polyneuropathy, morbid obesity, L TKA January 2018. Patient presented to the ED after a fall and confusion. .   Clinical Impression  Pt admitted with above diagnosis. Pt currently with functional limitations due to the deficits listed below (see PT Problem List). Pt ambulated 40' with RW, HR 110, dyspnea 2/4, SaO2 86% on room air walking, 92% on room air after 1 minute seated rest and pursed lip breathing. Pt is deconditioned from recent illness, good progress expected.  Pt will benefit from skilled PT to increase their independence and safety with mobility to allow discharge to the venue listed below.       Follow Up Recommendations Home health PT    Equipment Recommendations  None recommended by PT    Recommendations for Other Services       Precautions / Restrictions Precautions Precautions: Fall;Other (comment) Precaution Comments: monitor O2 Restrictions Weight Bearing Restrictions: No      Mobility  Bed Mobility               General bed mobility comments: up in recliner  Transfers Overall transfer level: Needs assistance Equipment used: Rolling walker (2 wheeled) Transfers: Sit to/from Stand Sit to Stand: Min guard         General transfer comment: VCs hand placement, stood from recliner  Ambulation/Gait Ambulation/Gait assistance: Min guard Gait Distance (Feet): 40 Feet Assistive device: Rolling walker (2 wheeled) Gait Pattern/deviations: Step-through pattern;Decreased stride length Gait velocity: decr   General Gait Details: distance limited by fatigue, 2/4 dyspnea, SaO2 86% on room air walking, HR 110, no loss of balance  Stairs            Wheelchair  Mobility    Modified Rankin (Stroke Patients Only)       Balance Overall balance assessment: Modified Independent                                           Pertinent Vitals/Pain Pain Assessment: 0-10 Pain Score: 4  Pain Location: L lateral chest (from fall, per pt/spouse) with coughing Pain Descriptors / Indicators: Sore Pain Intervention(s): Limited activity within patient's tolerance;Monitored during session    Home Living Family/patient expects to be discharged to:: Private residence Living Arrangements: Spouse/significant other Available Help at Discharge: Family;Available 24 hours/day Type of Home: House Home Access: Stairs to enter   [employer] of Steps: 2 Home Layout: One level Home Equipment: Walker - 2 wheels;Walker - 4 wheels;Bedside commode;Wheelchair - manual;Cane - single point Additional Comments: walks with RW    Prior Function Level of Independence: Independent with assistive device(s)         Comments: drives, independent ADLs     Hand Dominance        Extremity/Trunk Assessment   Upper Extremity Assessment Upper Extremity Assessment: Defer to OT evaluation    Lower Extremity Assessment Lower Extremity Assessment: Overall WFL for tasks assessed(B knee ext +4/5)    Cervical / Trunk Assessment Cervical / Trunk Assessment: Normal  Communication   Communication: No difficulties  Cognition Arousal/Alertness: Awake/alert Behavior During Therapy: WFL for tasks assessed/performed Overall Cognitive Status: Within Functional Limits for tasks assessed  General Comments      Exercises     Assessment/Plan    PT Assessment Patient needs continued PT services  PT Problem List Decreased activity tolerance;Cardiopulmonary status limiting activity;Decreased strength       PT Treatment Interventions Gait training;Functional mobility training;Therapeutic  activities;Therapeutic exercise;Patient/family education    PT Goals (Current goals can be found in the Care Plan section)  Acute Rehab PT Goals Patient Stated Goal: get strength back PT Goal Formulation: With patient Time For Goal Achievement: 10/25/18 Potential to Achieve Goals: Good    Frequency Min 3X/week   Barriers to discharge        Co-evaluation               AM-PAC PT "6 Clicks" Daily Activity  Outcome Measure Difficulty turning over in bed (including adjusting bedclothes, sheets and blankets)?: A Lot Difficulty moving from lying on back to sitting on the side of the bed? : A Lot Difficulty sitting down on and standing up from a chair with arms (e.g., wheelchair, bedside commode, etc,.)?: A Little Help needed moving to and from a bed to chair (including a wheelchair)?: A Little Help needed walking in hospital room?: A Little Help needed climbing 3-5 steps with a railing? : A Little 6 Click Score: 16    End of Session Equipment Utilized During Treatment: Gait belt Activity Tolerance: Patient limited by fatigue Patient left: in chair;with call bell/phone within reach;with family/visitor present Nurse Communication: Mobility status PT Visit Diagnosis: Difficulty in walking, not elsewhere classified (R26.2);Pain    Time: 3875-6433 PT Time Calculation (min) (ACUTE ONLY): 17 min   Charges:   PT Evaluation $PT Eval Moderate Complexity: 1 Mod          Philomena Doheny PT 10/11/2018  Acute Rehabilitation Services Pager [phone] Office [phone]

## 2018-10-11 NOTE — Progress Notes (Signed)
RN made aware that pt's visitor noted to have bug crawling on them by staff. Bug also noted on blanket brought in by visitor. Both bugs removed from room. None noted on pt. EVS notified. AC notified and spoke with pt and family. Will follow standard precautions and continue to monitor.

## 2018-10-11 NOTE — Evaluation (Signed)
Occupational Therapy Evaluation Patient Details Name: Debra Campbell MRN: [id number] DOB: [birth year] Today's Date: 10/11/2018    History of Present Illness 84 y.o patient admitted with SIRS. Has a medical history of type 2 Dm, hip edema, persistent atrial fibrillation, hypothyroidism, diabetic polyneuropathy, morbid obesity, L TKA January 2018. Patient presented to the ED after a fall and confusion. .    Clinical Impression   Pt was admitted for the above. At baseline, she is mod I for adls. She needs min/mod A at this time due to decreased endurance. Will follow in acute setting with supervision level goals    Follow Up Recommendations  Home health OT;Supervision/Assistance - 24 hour    Equipment Recommendations  None recommended by OT    Recommendations for Other Services       Precautions / Restrictions Precautions Precautions: Fall;Other (comment) Precaution Comments: monitor O2 Restrictions Weight Bearing Restrictions: No      Mobility Bed Mobility               General bed mobility comments: up in recliner  Transfers Overall transfer level: Needs assistance Equipment used: Rolling walker (2 wheeled) Transfers: Sit to/from Stand Sit to Stand: Min guard         General transfer comment: VCs hand placement, stood from recliner    Balance Overall balance assessment: Modified Independent                                         ADL either performed or assessed with clinical judgement   ADL Overall ADL's : Needs assistance/impaired Eating/Feeding: Independent   Grooming: Set up;Sitting   Upper Body Bathing: Set up;Sitting   Lower Body Bathing: Minimal assistance;Moderate assistance;Sit to/from stand   Upper Body Dressing : Set up;Sitting   Lower Body Dressing: Minimal assistance;Moderate assistance;Sit to/from stand                 General ADL Comments: pt uses sock aide at baseline.  Tends to hold breath during  activities.  Purewick in place but leaked     Vision         Perception     Praxis      Pertinent Vitals/Pain Pain Assessment: 0-10 Pain Score: 4  Pain Location: L lateral chest (from fall, per pt/spouse) with coughing and headache Pain Descriptors / Indicators: Aching;Sore Pain Intervention(s): Limited activity within patient's tolerance;Monitored during session;Repositioned;Patient requesting pain meds-RN notified     Hand Dominance     Extremity/Trunk Assessment Upper Extremity Assessment Upper Extremity Assessment: Overall WFL for tasks assessed      Cervical / Trunk Assessment Cervical / Trunk Assessment: Normal   Communication Communication Communication: No difficulties   Cognition Arousal/Alertness: Awake/alert Behavior During Therapy: WFL for tasks assessed/performed Overall Cognitive Status: Within Functional Limits for tasks assessed                                     General Comments       Exercises     Shoulder Instructions      Home Living Family/patient expects to be discharged to:: Private residence Living Arrangements: Spouse/significant other Available Help at Discharge: Family;Available 24 hours/day Type of Home: House Home Access: Stairs to enter [employer] of Steps: 2   Home Layout: One level     Bathroom  Shower/Tub: Tub/shower unit;Walk-in shower   Bathroom Toilet: Standard     Home Equipment: Environmental consultant - 2 wheels;Walker - 4 wheels;Bedside commode;Wheelchair - manual;Cane - single point   Additional Comments: walks with RW      Prior Functioning/Environment Level of Independence: Independent with assistive device(s)        Comments: drives, independent ADLs        OT Problem List: Decreased strength;Decreased activity tolerance;Cardiopulmonary status limiting activity;Pain      OT Treatment/Interventions: Self-care/ADL training;Energy conservation;DME and/or AE instruction;Patient/family  education;Balance training    OT Goals(Current goals can be found in the care plan section) Acute Rehab OT Goals Patient Stated Goal: get strength back OT Goal Formulation: With patient Time For Goal Achievement: 10/25/18 Potential to Achieve Goals: Good ADL Goals Pt Will Transfer to Toilet: with supervision;bedside commode;ambulating Pt Will Perform Toileting - Clothing Manipulation and hygiene: with supervision;sit to/from stand Additional ADL Goal #1: pt will initiate at least 2 rest breaks during adls without cues and perform ADL at set up/supervision level  OT Frequency: Min 2X/week   Barriers to D/C:            Co-evaluation              AM-PAC PT "6 Clicks" Daily Activity     Outcome Measure Help from another person eating meals?: None Help from another person taking care of personal grooming?: A Little Help from another person toileting, which includes using toliet, bedpan, or urinal?: A Little Help from another person bathing (including washing, rinsing, drying)?: A Lot Help from another person to put on and taking off regular upper body clothing?: A Little Help from another person to put on and taking off regular lower body clothing?: A Lot 6 Click Score: 17   End of Session    Activity Tolerance: Patient limited by fatigue Patient left: in chair;with call bell/phone within reach;with family/visitor present  OT Visit Diagnosis: Muscle weakness (generalized) (M62.81)                Time: 7494-4967 OT Time Calculation (min): 22 min Charges:  OT General Charges $OT Visit: 1 Visit OT Evaluation $OT Eval Low Complexity: [place], OTR/L Acute Rehabilitation Services [phone] [facility] pager [phone] office 10/11/2018  Graig Hessling 10/11/2018, 2:59 PM

## 2018-10-12 ENCOUNTER — Inpatient Hospital Stay (HOSPITAL_COMMUNITY): Payer: Medicare Other

## 2018-10-12 DIAGNOSIS — I370 Nonrheumatic pulmonary valve stenosis: Secondary | ICD-10-CM

## 2018-10-12 DIAGNOSIS — I361 Nonrheumatic tricuspid (valve) insufficiency: Secondary | ICD-10-CM

## 2018-10-12 LAB — GLUCOSE, CAPILLARY
GLUCOSE-CAPILLARY: 166 mg/dL — AB (ref 70–99)
GLUCOSE-CAPILLARY: 188 mg/dL — AB (ref 70–99)
GLUCOSE-CAPILLARY: 162 mg/dL — AB (ref 70–99)
Glucose-Capillary: 186 mg/dL — ABNORMAL HIGH (ref 70–99)
Glucose-Capillary: 230 mg/dL — ABNORMAL HIGH (ref 70–99)
Glucose-Capillary: 151 mg/dL — ABNORMAL HIGH (ref 70–99)

## 2018-10-12 LAB — COMPREHENSIVE METABOLIC PANEL
ALBUMIN: 3.2 g/dL — AB (ref 3.5–5.0)
ALT: 38 U/L (ref 0–44)
AST: 45 U/L — AB (ref 15–41)
Alkaline Phosphatase: 44 U/L (ref 38–126)
Anion gap: 8 (ref 5–15)
BUN: 17 mg/dL (ref 8–23)
CHLORIDE: 102 mmol/L (ref 98–111)
CO2: 26 mmol/L (ref 22–32)
Calcium: 8.2 mg/dL — ABNORMAL LOW (ref 8.9–10.3)
Creatinine, Ser: 0.77 mg/dL (ref 0.44–1.00)
GFR calc Af Amer: >60 mL/min (ref >60)
GFR calc non Af Amer: >60 mL/min (ref >60)
GLUCOSE: 158 mg/dL — AB (ref 70–99)
POTASSIUM: 3.7 mmol/L (ref 3.5–5.1)
SODIUM: 136 mmol/L (ref 135–145)
Total Bilirubin: 1.2 mg/dL (ref 0.3–1.2)
Total Protein: 6.1 g/dL — ABNORMAL LOW (ref 6.5–8.1)

## 2018-10-12 LAB — CBC WITH DIFFERENTIAL/PLATELET
Abs Immature Granulocytes: 0.01 10*3/uL (ref 0.00–0.07)
BASOS ABS: 0 10*3/uL (ref 0.0–0.1)
BASOS PCT: 1 %
Eosinophils Absolute: 0.2 10*3/uL (ref 0.0–0.5)
Eosinophils Relative: 4 %
HCT: 33.7 % — ABNORMAL LOW (ref 36.0–46.0)
Hemoglobin: 10.9 g/dL — ABNORMAL LOW (ref 12.0–15.0)
IMMATURE GRANULOCYTES: 0 %
Lymphocytes Relative: 31 %
Lymphs Abs: 1.3 10*3/uL (ref 0.7–4.0)
MCH: 31.2 pg (ref 26.0–34.0)
MCHC: 32.3 g/dL (ref 30.0–36.0)
MCV: 96.6 fL (ref 80.0–100.0)
Monocytes Absolute: 0.6 10*3/uL (ref 0.1–1.0)
Monocytes Relative: 13 %
NEUTROS ABS: 2.2 10*3/uL (ref 1.7–7.7)
NEUTROS PCT: 51 %
NRBC: 0 % (ref 0.0–0.2)
PLATELETS: 98 10*3/uL — AB (ref 150–400)
RBC: 3.49 MIL/uL — ABNORMAL LOW (ref 3.87–5.11)
RDW: 13.6 % (ref 11.5–15.5)
WBC: 4.2 10*3/uL (ref 4.0–10.5)

## 2018-10-12 LAB — IRON AND TIBC
Iron: 24 ug/dL — ABNORMAL LOW (ref 28–170)
Saturation Ratios: 10 % — ABNORMAL LOW (ref 10.4–31.8)
TIBC: 250 ug/dL (ref 250–450)
UIBC: 226 ug/dL

## 2018-10-12 LAB — ECHOCARDIOGRAM COMPLETE
Height: 64 in
Weight: 3671.98 oz

## 2018-10-12 LAB — VITAMIN B12: VITAMIN B 12: 193 pg/mL (ref 180–914)

## 2018-10-12 LAB — MAGNESIUM: MAGNESIUM: 1.8 mg/dL (ref 1.7–2.4)

## 2018-10-12 LAB — FOLATE: FOLATE: 19.9 ng/mL (ref >5.9)

## 2018-10-12 MED ORDER — VITAMIN B-12 1000 MCG PO TABS
1000.0000 ug | ORAL_TABLET | Freq: Every day | ORAL | Status: DC
  Administered 2018-10-12 – 2018-10-13 (×2): 1000 ug via ORAL
  Filled 2018-10-12 (×2): qty 1

## 2018-10-12 NOTE — Progress Notes (Signed)
PROGRESS NOTE  Debra Campbell YQM:[id number] DOB: 30-Jun-1932 DOA: 10/09/2018 PCP: Chevis Pretty, FNP  Brief summary:  patient originally fell in the morning EMS was called she refused transfer.  Few hours later family called in because patient was disoriented Patient is a low-grade fever 100.4 heart rate 100 satting 91% on room air she was started on 2 L and improved to 90% EMS gave 900 ml  NS  While in ER: Came more febrile with temperature up to 103.2   HPI/Recap of past 24 hours:  On room air at rest, denies pain or cough today Bilateral lower extremity edema improving, no fever,  Son and husband at bedside   Assessment/Plan: Active Problems:   Essential hypertension   Hypothyroidism   Hyperlipidemia   Diabetic neuropathy (HCC)   Type 2 diabetes mellitus with diabetic polyneuropathy (HCC)   Morbid obesity (HCC)   Persistent atrial fibrillation   Status post total left knee replacement   Sepsis ([place])   SIRS (systemic inflammatory response syndrome) (HCC)    Acute metabolic Encephalopathy:  CT head no acute findings Resolved, now aaox3  Fever  103.2 on presentation: Respiratory viral panel negative, mrsa screening negative, blood culture/urine culture no growth No leukocytosis, but does has lactic acidosis initially cxr with peribronchial thickening, bilateral legs with blisters on presentation She received vanc/aztreonam /zithromax since admission from 11/11 to 11/12 She is currently monitored off abx   Acute on chronic diastolic chf exacerbation, acute hypoxic respiratory failure: -Patient reports progressive bilateral lower extremity edema and weight gain - she has been on lasix at home ( per son , she has been noncompliance with lasix) -Cxr: Cardiomegaly with mild pulmonary vascular congestion and mild chronic peribronchial thickening -bnp elevated at 357 on presentation, Troponin mild and flat at 0.04 to 0.05 -ekg with chronic afib,  -repeat   Echocardiogram today with preserved lvef,  Moderately to severly pulmonary hypertension PA pressure at 41mmhg. - ivf d/ed on 11/12, restart lasix from 11/13, - wean o2 "Pt ambulated 40' with RW, HR 110, dyspnea 2/4, SaO2 86% on room air walking, 92% on room air after 1 minute seated rest and pursed lip breathing" -Strict intake and output, daily weight -She reports has been sleeping in recliner for yrs due to hip/knee issues May need to discharge on home 02  Mildly elevated lft From liver congestion? I have reviewed past CT scan from 2013 which showed hepatosteatosis at the time Will repeat liver US  Thrombocytopenia: Unclear etiology b12 low normal, start b12 supplement, folate level wnl Mildly elevated liver function Monitor  Chronic afib Rate controlled on toprol-xl, continue on xarelto   noninsulin dependent dm2, well controlled a1c 6.7 Home meds metformin held in the hospital, plan to resume at discharge Currently on ssi    AKI on CKDII Cr on presentation 1.11, today 0.77 Monitor renal function, renal dosing meds  Anemia of chronic disease hgb stable around 11.  Hypothyroid. Continue levothyroxine tsh 0.79  Obesity: Body mass index is 39.39 kg/m.  Will benefit from outpatient sleep study  Fall at home Fall precaution, PT eval  Code Status: full  Family Communication: patient and husband /son at bedside  Disposition Plan:  home with home health  Tomorrow, may need home 02   Consultants:  none  Procedures:  none  Antibiotics:  As above   Objective: BP 115/61 (BP Location: Left Arm)   Pulse 84   Temp 98 F (36.7 C) (Oral)   Resp 20   Ht  5\' 4"  (1.626 m)   Wt 104.1 kg   SpO2 93%   BMI 39.39 kg/m   Intake/Output Summary (Last 24 hours) at 10/12/2018 1330 Last data filed at 10/11/2018 2122 Gross per 24 hour  Intake 540 ml  Output 1200 ml  Net -660 ml   Filed Weights   10/10/18 1244  Weight: 104.1 kg    Exam: Patient is examined  daily including today on 10/12/2018, exams remain the same as of yesterday except that has changed    General:  NAD. Aaox3, obese  Cardiovascular: IRRR  Respiratory: CTABL  Abdomen: Soft/ND/NT, positive BS  Musculoskeletal: + bilateral lower extremity pitting Edema, does not appear having cellulitis  Neuro: alert, oriented   Data Reviewed: Basic Metabolic Panel: Recent Labs  Lab 10/09/18 1455 10/10/18 0514 10/11/18 0303 10/12/18 0528  NA 136 137 135 136  K 4.1 4.2 4.6 3.7  CL 101 104 105 102  CO2 24 25 22 26   GLUCOSE 231* 187* 258* 158*  BUN 12 15 16 17   CREATININE 1.11* 1.02* 0.78 0.77  CALCIUM 8.4* 7.9* 8.1* 8.2*  MG  --  1.6*  --  1.8  PHOS  --  3.0  --   --    Liver Function Tests: Recent Labs  Lab 10/09/18 1455 10/10/18 0514 10/12/18 0528  AST 36 43* 45*  ALT 23 23 38  ALKPHOS 51 46 44  BILITOT 1.6* 1.2 1.2  PROT 7.0 6.3* 6.1*  ALBUMIN 3.9 3.4* 3.2*   No results for input(s): LIPASE, AMYLASE in the last 168 hours. No results for input(s): AMMONIA in the last 168 hours. CBC: Recent Labs  Lab 10/09/18 1455 10/10/18 0514 10/11/18 0303 10/12/18 0528  WBC 6.3 5.6 4.9 4.2  NEUTROABS 5.4  --  3.6 2.2  HGB 12.3 11.7* 10.6* 10.9*  HCT 38.8 36.4 33.8* 33.7*  MCV 99.0 97.3 97.4 96.6  PLT 111* 98* 85* 98*   Cardiac Enzymes:   Recent Labs  Lab 10/09/18 1502 10/10/18 0058 10/10/18 0514 10/10/18 1001  TROPONINI <0.03 0.04* 0.05* 0.04*   BNP (last 3 results) Recent Labs    10/09/18 1502  BNP 357.2*    ProBNP (last 3 results) No results for input(s): PROBNP in the last 8760 hours.  CBG: Recent Labs  Lab 10/10/18 1610 10/10/18 2056 10/11/18 0745 10/11/18 1110 10/12/18 0742  GLUCAP 270* 196* 182* 207* 151*    Recent Results (from the past 240 hour(s))  Culture, blood (Routine x 2)     Status: None (Preliminary result)   Collection Time: 10/09/18  2:55 PM  Result Value Ref Range Status   Specimen Description   Final    RIGHT  ANTECUBITAL Performed at [facility] [address], [place], [place] [address]    Special Requests   Final    BOTTLES DRAWN AEROBIC AND ANAEROBIC Blood Culture adequate volume Performed at [facility] [address] [place], [place] [address]    Culture   Final    NO GROWTH 3 DAYS Performed at [facility], [place] [address], [place], [place] [address]    Report Status PENDING  Incomplete  Culture, blood (Routine x 2)     Status: None (Preliminary result)   Collection Time: 10/09/18  3:00 PM  Result Value Ref Range Status   Specimen Description   Final    LEFT ANTECUBITAL Performed at [facility] [address], [place],  [address]    Special Requests  Final    BOTTLES DRAWN AEROBIC AND ANAEROBIC Blood Culture results may not be optimal due to an excessive volume of blood received in culture bottles Performed at [facility] [address], [place], [place] [address]    Culture   Final    NO GROWTH 3 DAYS Performed at [facility], [place] [address], [place], [place] [address]    Report Status PENDING  Incomplete  Urine culture     Status: None   Collection Time: 10/09/18  4:19 PM  Result Value Ref Range Status   Specimen Description   Final    URINE, CATHETERIZED Performed at [facility] [address] [place], [place] [address]    Special Requests   Final    NONE Performed at [facility], [place] [address], [place], [place] [address]    Culture   Final    NO GROWTH Performed at [facility], [place] [address], [place], [place] [address]    Report Status 10/10/2018 FINAL  Final  Respiratory Panel by PCR     Status: None   Collection Time: 10/09/18  7:01 PM  Result Value Ref Range Status   Adenovirus NOT DETECTED NOT DETECTED Final   Coronavirus 229E NOT DETECTED NOT DETECTED Final   Coronavirus HKU1 NOT DETECTED NOT DETECTED  Final   Coronavirus NL63 NOT DETECTED NOT DETECTED Final   Coronavirus OC43 NOT DETECTED NOT DETECTED Final   Metapneumovirus NOT DETECTED NOT DETECTED Final   Rhinovirus / Enterovirus NOT DETECTED NOT DETECTED Final   Influenza A NOT DETECTED NOT DETECTED Final   Influenza B NOT DETECTED NOT DETECTED Final   Parainfluenza Virus 1 NOT DETECTED NOT DETECTED Final   Parainfluenza Virus 2 NOT DETECTED NOT DETECTED Final   Parainfluenza Virus 3 NOT DETECTED NOT DETECTED Final   Parainfluenza Virus 4 NOT DETECTED NOT DETECTED Final   Respiratory Syncytial Virus NOT DETECTED NOT DETECTED Final   Bordetella pertussis NOT DETECTED NOT DETECTED Final   Chlamydophila pneumoniae NOT DETECTED NOT DETECTED Final   Mycoplasma pneumoniae NOT DETECTED NOT DETECTED Final    Comment: Performed at [facility], [place] [address] [place], [place] [address]  MRSA PCR Screening     Status: None   Collection Time: 10/10/18 12:39 PM  Result Value Ref Range Status   MRSA by PCR NEGATIVE NEGATIVE Final    Comment:        The GeneXpert MRSA Assay (FDA approved for NASAL specimens only), is one component of a comprehensive MRSA colonization surveillance program. It is not intended to diagnose MRSA infection nor to guide or monitor treatment for MRSA infections. Performed at [facility], [place] [address], [place], [place] [address]      Studies: No results found.  Scheduled Meds: . furosemide  40 mg Oral BID  . gabapentin  300 mg Oral BID  . guaiFENesin  600 mg Oral BID  . insulin aspart  0-15 Units Subcutaneous TID WC  . insulin aspart  0-5 Units Subcutaneous QHS  . levothyroxine  175 mcg Oral Q0600  . metoprolol succinate  25 mg Oral Daily  . rivaroxaban  20 mg Oral Q breakfast  . sodium chloride flush  3 mL Intravenous Q12H    Continuous Infusions:   Time spent: 64mins I have personally reviewed and interpreted on  10/12/2018 daily labs, tele strips, imagings as  discussed above under date review session and assessment and plans.  I reviewed all nursing notes, pharmacy notes,   vitals, pertinent old records  I have discussed plan of care as described above with RN , patient and family on 10/12/2018   Florencia Reasons MD, PhD  [facility] Pager [phone]. If 7PM-7AM, please contact night-coverage at [email or web address], password [facility] 10/12/2018, 1:30 PM  LOS: 3 days

## 2018-10-12 NOTE — Progress Notes (Signed)
  Echocardiogram 2D Echocardiogram has been performed.  Debra Campbell 10/12/2018, 3:15 PM

## 2018-10-12 NOTE — Care Management Note (Signed)
Case Management Note  Patient Details  Name: Debra Campbell MRN: [id number] Date of Birth: 1932-01-19  Subjective/Objective:  From home w/spouse.PT recc HHC. Noted 02 sats dropped. Will monitor 02. [facility] chosen for [facility]HC-HHRN/PT/OT-rep Santiago Glad aware.                  Action/Plan:d/c home w/HHC.   Expected Discharge Date:  (unknown)               Expected Discharge Plan:  [name]  In-House Referral:     Discharge planning Services  CM Consult  Post Acute Care Choice:  (cane,rw) Choice offered to:  Patient  DME Arranged:    DME Agency:     HH Arranged:  RN, PT, OT HH Agency:  [facility]  Status of Service:  In process, will continue to follow  If discussed at Long Length of Stay Meetings, dates discussed:    Additional Comments:  Dessa Phi, RN 10/12/2018, 3:44 PM

## 2018-10-12 NOTE — Progress Notes (Signed)
Patient o2 saturations dropped to 86% on RA when ambulating in the hall. Placed on 2L o2, saturations rose to 93%.

## 2018-10-13 ENCOUNTER — Inpatient Hospital Stay (HOSPITAL_COMMUNITY): Payer: Medicare Other

## 2018-10-13 DIAGNOSIS — R945 Abnormal results of liver function studies: Secondary | ICD-10-CM

## 2018-10-13 DIAGNOSIS — E1142 Type 2 diabetes mellitus with diabetic polyneuropathy: Secondary | ICD-10-CM

## 2018-10-13 DIAGNOSIS — R6510 Systemic inflammatory response syndrome (SIRS) of non-infectious origin without acute organ dysfunction: Secondary | ICD-10-CM

## 2018-10-13 LAB — CBC WITH DIFFERENTIAL/PLATELET
Abs Immature Granulocytes: 0.02 10*3/uL (ref 0.00–0.07)
BASOS PCT: 1 %
Basophils Absolute: 0 10*3/uL (ref 0.0–0.1)
EOS PCT: 5 %
Eosinophils Absolute: 0.2 10*3/uL (ref 0.0–0.5)
HEMATOCRIT: 35.1 % — AB (ref 36.0–46.0)
Hemoglobin: 11.3 g/dL — ABNORMAL LOW (ref 12.0–15.0)
Immature Granulocytes: 1 %
LYMPHS ABS: 1.2 10*3/uL (ref 0.7–4.0)
Lymphocytes Relative: 28 %
MCH: 31.1 pg (ref 26.0–34.0)
MCHC: 32.2 g/dL (ref 30.0–36.0)
MCV: 96.7 fL (ref 80.0–100.0)
MONOS PCT: 14 %
Monocytes Absolute: 0.6 10*3/uL (ref 0.1–1.0)
Neutro Abs: 2.2 10*3/uL (ref 1.7–7.7)
Neutrophils Relative %: 51 %
PLATELETS: 122 10*3/uL — AB (ref 150–400)
RBC: 3.63 MIL/uL — ABNORMAL LOW (ref 3.87–5.11)
RDW: 13.2 % (ref 11.5–15.5)
WBC: 4.2 10*3/uL (ref 4.0–10.5)
nRBC: 0 % (ref 0.0–0.2)

## 2018-10-13 LAB — MAGNESIUM: MAGNESIUM: 1.6 mg/dL — AB (ref 1.7–2.4)

## 2018-10-13 LAB — COMPREHENSIVE METABOLIC PANEL
ALT: 72 U/L — ABNORMAL HIGH (ref 0–44)
ANION GAP: 9 (ref 5–15)
AST: 76 U/L — ABNORMAL HIGH (ref 15–41)
Albumin: 3.1 g/dL — ABNORMAL LOW (ref 3.5–5.0)
Alkaline Phosphatase: 56 U/L (ref 38–126)
BILIRUBIN TOTAL: 1.2 mg/dL (ref 0.3–1.2)
BUN: 17 mg/dL (ref 8–23)
CHLORIDE: 101 mmol/L (ref 98–111)
CO2: 29 mmol/L (ref 22–32)
Calcium: 8.4 mg/dL — ABNORMAL LOW (ref 8.9–10.3)
Creatinine, Ser: 0.86 mg/dL (ref 0.44–1.00)
Glucose, Bld: 182 mg/dL — ABNORMAL HIGH (ref 70–99)
POTASSIUM: 3.4 mmol/L — AB (ref 3.5–5.1)
Sodium: 139 mmol/L (ref 135–145)
TOTAL PROTEIN: 6.2 g/dL — AB (ref 6.5–8.1)

## 2018-10-13 LAB — GLUCOSE, CAPILLARY: GLUCOSE-CAPILLARY: 172 mg/dL — AB (ref 70–99)

## 2018-10-13 MED ORDER — CYANOCOBALAMIN 1000 MCG PO TABS: 1000.0000 ug | ORAL_TABLET | Freq: Every day | ORAL | 0 refills | Status: DC

## 2018-10-13 MED ORDER — ACETAMINOPHEN 500 MG PO TABS: 500.0000 mg | ORAL_TABLET | Freq: Three times a day (TID) | ORAL | 0 refills | Status: AC | PRN

## 2018-10-13 MED ORDER — LOSARTAN POTASSIUM 25 MG PO TABS: 12.5000 mg | ORAL_TABLET | Freq: Every day | ORAL | 0 refills | Status: DC

## 2018-10-13 MED ORDER — MAGNESIUM SULFATE 2 GM/50ML IV SOLN
2.0000 g | Freq: Once | INTRAVENOUS | Status: AC
  Administered 2018-10-13: 2 g via INTRAVENOUS
  Filled 2018-10-13: qty 50

## 2018-10-13 MED ORDER — POTASSIUM CHLORIDE CRYS ER 20 MEQ PO TBCR
40.0000 meq | EXTENDED_RELEASE_TABLET | Freq: Once | ORAL | Status: AC
  Administered 2018-10-13: 40 meq via ORAL
  Filled 2018-10-13: qty 2

## 2018-10-13 MED ORDER — POTASSIUM CHLORIDE ER 10 MEQ PO TBCR: 10.0000 meq | EXTENDED_RELEASE_TABLET | ORAL | 0 refills | Status: DC

## 2018-10-13 MED ORDER — MAGNESIUM GLUCONATE 30 MG PO TABS: 30.0000 mg | ORAL_TABLET | Freq: Every day | ORAL | 0 refills | Status: DC

## 2018-10-13 NOTE — Progress Notes (Signed)
Occupational Therapy Treatment Patient Details Name: Debra Campbell MRN: [id number] DOB: [birth year] Today's Date: 10/13/2018    History of present illness 84 y.o patient admitted with SIRS. Has a medical history of type 2 Dm, hip edema, persistent atrial fibrillation, hypothyroidism, diabetic polyneuropathy, morbid obesity, L TKA January 2018. Patient presented to the ED after a fall and confusion. .    OT comments  Pt initially dizzy; BP WFLs.  Donned underwear and got to chair. Tx cut short due to breakfast arriving   Follow Up Recommendations  Home health OT;Supervision/Assistance - 24 hour    Equipment Recommendations  None recommended by OT    Recommendations for Other Services      Precautions / Restrictions Precautions Precautions: Fall;Other (comment) Precaution Comments: monitor O2 Restrictions Weight Bearing Restrictions: No       Mobility Bed Mobility Overal bed mobility: Modified Independent             General bed mobility comments: HOB raised; extra time and use of rail  Transfers   Equipment used: Rolling walker (2 wheeled)   Sit to Stand: Min guard         General transfer comment: for safety and cues for UE placement    Balance                                           ADL either performed or assessed with clinical judgement   ADL                       Lower Body Dressing: Maximal assistance;Sit to/from stand Lower Body Dressing Details (indicate cue type and reason): for panties and pad Toilet Transfer: Min guard;Stand-pivot;RW(chair)             General ADL Comments: Pt agreeable to walking to bathroom for oral care. Donned underwear and pad with max A.  Pt felt dizzy:  BP WFLs.  Breakfast came and pt wanted to eat while it was hot. Transferred to chair only     Vision       Perception     Praxis      Cognition Arousal/Alertness: Awake/alert Behavior During Therapy: WFL for tasks  assessed/performed Overall Cognitive Status: Within Functional Limits for tasks assessed                                          Exercises     Shoulder Instructions       General Comments      Pertinent Vitals/ Pain       Pain Assessment: No/denies pain  Home Living                                          Prior Functioning/Environment              Frequency  Min 2X/week        Progress Toward Goals  OT Goals(current goals can now be found in the care plan section)  Progress towards OT goals: Progressing toward goals     Plan      Co-evaluation  AM-PAC PT "6 Clicks" Daily Activity     Outcome Measure   Help from another person eating meals?: None Help from another person taking care of personal grooming?: A Little Help from another person toileting, which includes using toliet, bedpan, or urinal?: A Little Help from another person bathing (including washing, rinsing, drying)?: A Lot Help from another person to put on and taking off regular upper body clothing?: A Little Help from another person to put on and taking off regular lower body clothing?: A Lot 6 Click Score: 17    End of Session    OT Visit Diagnosis: Muscle weakness (generalized) (M62.81)   Activity Tolerance Other (comment)(limited by breakfast arriving)   Patient Left in chair;with call bell/phone within reach;with family/visitor present   Nurse Communication          Time: 3016-0109 OT Time Calculation (min): 22 min  Charges: OT General Charges $OT Visit: 1 Visit OT Treatments $Self Care/Home Management : 8-22 mins  Lesle Chris, OTR/L Acute Rehabilitation Services [phone] [facility] pager [phone] office 10/13/2018   [name] 10/13/2018, 10:03 AM

## 2018-10-13 NOTE — Progress Notes (Signed)
SATURATION QUALIFICATIONS: (This note is used to comply with regulatory documentation for home oxygen)  Patient Saturations on Room Air at Rest = 91%  Patient Saturations on Room Air while Ambulating = 87%  Patient Saturations on 2 Liters of oxygen while Ambulating = 92%  Please briefly explain why patient needs home oxygen: Pt desaturates on room air while walking

## 2018-10-13 NOTE — Care Management Note (Signed)
Case Management Note  Patient Details  Name: LOLETTA HARPER MRN: [id number] Date of Birth: [birth year]  Subjective/Objective:Will need 02 sats documented in progress note with @ rest. [facility] already following                     Action/Plan:home w/HHC/home 02   Expected Discharge Date:  (unknown)               Expected Discharge Plan:  [place]  In-House Referral:     Discharge planning Services  CM Consult  Post Acute Care Choice:  (cane,rw) Choice offered to:  Patient  DME Arranged:  Oxygen DME Agency:  [place] Arranged:  RN, PT, OT [facility] Agency:  [place]  Status of Service:  Completed, signed off  If discussed at [place] of Stay Meetings, dates discussed:    Additional Comments:  Dessa Phi, RN 10/13/2018, 10:16 AM

## 2018-10-13 NOTE — Care Management Important Message (Signed)
Important Message  Patient Details  Name: Debra Campbell MRN: [id number] Date of Birth: 1932-05-01   Medicare Important Message Given:  Yes    Kerin Salen 10/13/2018, 10:51 AMImportant Message  Patient Details  Name: Debra Campbell MRN: [id number] Date of Birth: 28-Sep-1932   Medicare Important Message Given:  Yes    Kerin Salen 10/13/2018, 10:50 AM

## 2018-10-13 NOTE — Discharge Summary (Addendum)
Discharge Summary  Debra Campbell XLK:[id number] DOB: 1931/12/22  PCP: Chevis Pretty, FNP  Admit date: 10/09/2018 Discharge date: 10/13/2018  Time spent: 46mins, more than 50% time spent on coordination of care.  Recommendations for Outpatient Follow-up:  1. F/u with PCP within a week  for hospital discharge follow up, repeat cbc/bmp at follow up. pcp to arrange outpatient sleep study 2. F/u with cardiology Dr Ellyn Hack for afib and heart failure 3. Home health PT/OT/RN 4. Home o2 arranged  Discharge Diagnoses:  Active Hospital Problems   Diagnosis Date Noted  . Sepsis ([place]) 10/09/2018  . SIRS (systemic inflammatory response syndrome) ([place]) 10/09/2018  . Status post total left knee replacement 12/22/2016  . Persistent atrial fibrillation 09/28/2016  . Morbid obesity ([place]) 08/18/2015  . Type 2 diabetes mellitus with diabetic polyneuropathy ([place]) 05/01/2015  . Essential hypertension 03/18/2011  . Hyperlipidemia 03/18/2011  . Hypothyroidism 03/18/2011  . Diabetic neuropathy ([place]) 03/18/2011    Resolved Hospital Problems  No resolved problems to display.    Discharge Condition: stable  Diet recommendation: heart healthy/carb modified/1200c fluids restriction  Filed Weights   10/10/18 1244 10/13/18 0511  Weight: 104.1 kg 102.1 kg    History of present illness: (per admitting MD Dr Roel Cluck) HPI: Debra Campbell is a 84 y.o. female with medical history significant of DM 2,  Hip edema, persistent atrial fibrillation, hypothyroidism, diabetic polyneuropathy, morbid obesity.   Presented with fall today patient originally fell in the morning EMS was called she refused transfer.  Few hours later family called in because patient was disoriented Patient is a low-grade fever 100.4 heart rate 100 satting 91% on room air she was started on 2 L and improved to 90% EMS gave 900 ml  NS  Regarding pertinent Chronic problems:  A.fib on anticoagulation Stage IB grade 1  endometrial adenocarcinoma (July 2014). Clinically free of disease sp. total abdominal hysterectomy bilateral salpingo-oophorectomy and pelvic lymphadenectomy on 06/26/2013. Last echo 2017 preserved EF 3 of diabetes but does not check her blood sugar at home last HGBA1c was 6.8% While in ER: Came more febrile with temperature up to 103.2 likely  sepsis started on IV antibiotics including vancomycin and aztreonam Initial lactic acid 2.24 after IV fluids down to 1.59 UA showing hematuria but no evidence of UTI The following Work up has been ordered so far:  Hospital Course:  Active Problems:   Essential hypertension   Hypothyroidism   Hyperlipidemia   Diabetic neuropathy ([facility])   Type 2 diabetes mellitus with diabetic polyneuropathy (H[facility])   Morbid obesity ([place])   Persistent atrial fibrillation   Status post total left knee replacement   Sepsis ([place])   SIRS (systemic inflammatory response syndrome) (H[facility])   Acute metabolic Encephalopathy:  CT head no acute findings Resolved, now aaox3  Fever  103.2 on presentation: Respiratory viral panel negative, mrsa screening negative, blood culture/urine culture no growth No leukocytosis, but does has lactic acidosis initially cxr with peribronchial thickening, bilateral legs with blisters on presentation She received vanc/aztreonam /zithromax since admission from 11/11 to 11/12 Has been stable off abx,  Patient does meet SIRS criteria on presentation, but sepsis undetermined   Acute on chronic diastolic chf exacerbation, acute hypoxic respiratory failure: -Patient reports progressive bilateral lower extremity edema and weight gain - she has been on lasix at home ( per son , she has been noncompliance with lasix) -Cxr: Cardiomegaly with mild pulmonary vascular congestion and mild chronic peribronchial thickening -bnp elevated at 357 on presentation, Troponin mild and  flat at 0.04 to 0.05 -ekg with chronic afib,  -repeat  Echocardiogram  today with preserved lvef,  Moderately to severly pulmonary hypertension PA pressure at 21mmhg. - ivf d/ed on 11/12, restart lasix from 11/13, - wean o2 "Pt ambulated 40' with RW, HR 110, dyspnea 2/4, SaO2 86% on room air walking, 92% on room air after 1 minute seated rest and pursed lip breathing" -She reports has been sleeping in recliner for yrs due to hip/knee issues -improving, tolerating lasix 40mg  bid,  Urine output last 24hrs 3.4liters, edema has improved, discharge on home 02 - home health RN for heart failure management arranged, patient is advised to perform daily weight and monitor fluids intake -f/u with cardiology Dr Ellyn Hack,   Mildly elevated lft From liver congestion? I have reviewed past CT scan from 2013 which showed hepatosteatosis at the time  liver US this hospitalization unremarkable F/u with pcp to monitor liver function  Thrombocytopenia: Unclear etiology, plt nadir at 85, plt improved at discharge (plt 122) b12 low normal, start b12 supplement, folate level wnl Mildly elevated liver function Monitor  Chronic afib Rate controlled on toprol-xl, continue on xarelto   noninsulin dependent dm2, well controlled a1c 6.7 Home meds metformin held in the hospital,  resume at discharge   HTN: She is discharged on metoprolol, lasix and reduced dose of losartan   AKI on CKDII Cr on presentation 1.11, today 0.77 Monitor renal function, renal dosing meds Home meds losartan 50mg  daily held in the hospital, discharged on reduced dose ( 12.5mg  daily)  Anemia of chronic disease hgb stable around 11.  Hypothyroid. Continue levothyroxine tsh 0.79  Obesity: Body mass index is 39.39 kg/m.  Will benefit from outpatient sleep study  Fall at home Fall precaution, PT eval, home health  Code Status: full  Family Communication: patient and family at bedside  Disposition Plan:  home with home health  and home  02   Consultants:  none  Procedures:  none  Antibiotics:  As above   Discharge Exam: BP 125/70   Pulse 80   Temp 97.8 F (36.6 C) (Oral)   Resp 17   Ht 5\' 4"  (1.626 m)   Wt 102.1 kg   SpO2 94%   BMI 38.64 kg/m   General: NAD Cardiovascular: IRRR Respiratory: diminished at basis, mild bibasilar crackles, no wheezing, no rhonchi  Extremity: bilateral lower extremity edema improved, no overlaying cellulitis on exam  Discharge Instructions You were cared for by a hospitalist during your hospital stay. If you have any questions about your discharge medications or the care you received while you were in the hospital after you are discharged, you can call the unit and asked to speak with the hospitalist on call if the hospitalist that took care of you is not available. Once you are discharged, your primary care physician will handle any further medical issues. Please note that NO REFILLS for any discharge medications will be authorized once you are discharged, as it is imperative that you return to your primary care physician (or establish a relationship with a primary care physician if you do not have one) for your aftercare needs so that they can reassess your need for medications and monitor your lab values.  Discharge Instructions    Diet - low sodium heart healthy   Complete by:  As directed    Fluids restriction to 1200cc per day, carb modified diet   Discharge instructions   Complete by:  As directed    Please weight  yourself daily, please call your doctor if your weight goes up 3lbs in three days and your legs are swollen   Increase activity slowly   Complete by:  As directed      Allergies as of 10/13/2018      Reactions   Ace Inhibitors Cough   Iohexol     Desc: HIVES 40 YEARS AGO   Statins    myalgia   Penicillins Swelling, Rash   Has patient had a PCN reaction causing immediate rash, facial/tongue/throat swelling, SOB or lightheadedness with  hypotension: Yes Has patient had a PCN reaction causing severe rash involving mucus membranes or skin necrosis: No Has patient had a PCN reaction that required hospitalization Yes Has patient had a PCN reaction occurring within the last 10 years: No If all of the above answers are "NO", then may proceed with Cephalosporin use.   Sulfa Antibiotics Rash      Medication List    STOP taking these medications   clindamycin 150 MG capsule Commonly known as:  CLEOCIN   methocarbamol 500 MG tablet Commonly known as:  ROBAXIN     TAKE these medications   ACCU-CHEK AVIVA device Use as instructed   acetaminophen 500 MG tablet Commonly known as:  TYLENOL Take 1 tablet (500 mg total) by mouth every 8 (eight) hours as needed. What changed:    how much to take  when to take this  reasons to take this   cyanocobalamin 1000 MCG tablet Take 1 tablet (1,000 mcg total) by mouth daily. Start taking on:  10/14/2018   furosemide 40 MG tablet Commonly known as:  LASIX Take 1 tablet (40 mg total) by mouth 2 (two) times daily.   gabapentin 300 MG capsule Commonly known as:  NEURONTIN Take 1 capsule (300 mg total) by mouth 2 (two) times daily.   glucose blood test strip Test 1x per day and prn. DX.E11.9   levothyroxine 175 MCG tablet Commonly known as:  SYNTHROID, LEVOTHROID Take 1 tablet (175 mcg total) by mouth daily before breakfast.   losartan 25 MG tablet Commonly known as:  COZAAR Take 0.5 tablets (12.5 mg total) by mouth daily. What changed:    medication strength  how much to take   magnesium gluconate 30 MG tablet Commonly known as:  MAGONATE Take 1 tablet (30 mg total) by mouth daily.   metFORMIN 1000 MG tablet Commonly known as:  GLUCOPHAGE TAKE 1 TABLET TWICE DAILY WITH A MEAL What changed:    how much to take  how to take this  when to take this Notes to patient:  Please resume this Medication at home   metoprolol succinate 25 MG 24 hr tablet Commonly  known as:  TOPROL-XL Take 1 tablet (25 mg total) by mouth daily.   nystatin cream Commonly known as:  MYCOSTATIN Apply 1 application topically 2 (two) times daily.   potassium chloride 10 MEQ tablet Commonly known as:  K-DUR Take 1 tablet (10 mEq total) by mouth every Sunday. Start taking on:  10/15/2018 Notes to patient:  This medication is scheduled to be taken every Sunday. A dose of this Medication 40 meq was given on 10/13/2018 at 9:34 am   rivaroxaban 20 MG Tabs tablet Commonly known as:  XARELTO TAKE 1 TABLET EVERY DAY WITH BREAKFAST   triamcinolone cream 0.5 % Commonly known as:  KENALOG Apply 1 application topically 3 (three) times daily as needed. What changed:  reasons to take this   TRUEPLUS LANCETS 30G Misc  CHECK BLOOD SUGAR ONCE A DAY AND AS NEEDED            Durable Medical Equipment  (From admission, onward)         Start     Ordered   10/13/18 1206  DME Oxygen  Once    Question Answer Comment  Mode or (Route) Nasal cannula   Liters per Minute 2   Frequency Continuous (stationary and portable oxygen unit needed)   Oxygen conserving device Yes   Oxygen delivery system Gas      10/13/18 1205         Allergies  Allergen Reactions  . Ace Inhibitors Cough  . Iohexol      Desc: HIVES 40 YEARS AGO   . Statins     myalgia  . Penicillins Swelling and Rash    Has patient had a PCN reaction causing immediate rash, facial/tongue/throat swelling, SOB or lightheadedness with hypotension: Yes Has patient had a PCN reaction causing severe rash involving mucus membranes or skin necrosis: No Has patient had a PCN reaction that required hospitalization Yes Has patient had a PCN reaction occurring within the last 10 years: No If all of the above answers are "NO", then may proceed with Cephalosporin use.   . Sulfa Antibiotics Rash   Follow-up Information    [facility] Follow [facility].   Specialty:  [place] Why:  [facility]  nursing/physical therapy,occupational therapy Contact information: [address] [place] [address] [phone]        Chevis Pretty, FNP Follow up in 1 week(s).   Specialty:  Family Medicine Why:  hospital discharge follow up, repeat cbc/cmp at hospital follow up. pcp to monitor liver function, pcp to refer your to have outpatient sleep study Contact information: [place] [address] [phone]        Leonie Man, MD Follow up in 2 week(s).   Specialty:  Cardiology Why:  afib/ heart failure Contact information: [place] [address] [phone]            The results of significant diagnostics from this hospitalization (including imaging, microbiology, ancillary and laboratory) are listed below for reference.    Significant Diagnostic Studies: Dg Chest 2 View  Result Date: 10/09/2018 CLINICAL DATA:  Shortness of breath and possible sepsis. EXAM: CHEST - 2 VIEW COMPARISON:  12/17/2016 FINDINGS: Cardiomegaly and mild pulmonary vascular congestion noted. Mild chronic peribronchial thickening again noted. There is no evidence of focal airspace disease, pulmonary edema, suspicious pulmonary nodule/mass, pleural effusion, or pneumothorax. No acute bony abnormalities are identified. IMPRESSION: Cardiomegaly with mild pulmonary vascular congestion and mild chronic peribronchial thickening. Electronically Signed   By: Margarette Canada M.D.   On: 10/09/2018 16:16   Dg Abd 1 View  Result Date: 10/09/2018 CLINICAL DATA:  Abdominal distension EXAM: ABDOMEN - 1 VIEW COMPARISON:  None. FINDINGS: Normal bowel gas pattern. There is dextroscoliosis of the lumbar spine. Right hip arthroplasty and moderate left hip osteoarthrosis. IMPRESSION: Nonobstructed bowel gas pattern. Electronically Signed   By: Ulyses Jarred M.D.   On: 10/09/2018 21:07   Ct Head Wo Contrast  Result Date: 10/09/2018 CLINICAL DATA:  84 year old  female with a history of fall EXAM: CT HEAD WITHOUT CONTRAST CT CERVICAL SPINE WITHOUT CONTRAST TECHNIQUE: Multidetector CT imaging of the head and cervical spine was performed following the standard protocol without intravenous contrast. Multiplanar CT image reconstructions of the cervical spine were also generated. COMPARISON:  None. FINDINGS: CT HEAD FINDINGS Brain: No acute intracranial hemorrhage. No midline shift or mass effect. Gray-white differentiation maintained. Unremarkable appearance of the ventricular system. Mild volume loss. Vascular: Calcifications of the intracranial vasculature. Skull: No acute fracture.  No aggressive bone lesion identified. Sinuses/Orbits: Unremarkable appearance of the orbits. Mastoid air cells clear. No middle ear effusion. No significant sinus disease. Other: None CT CERVICAL SPINE FINDINGS Alignment: Straightening of the normal cervical lordosis, potentially positional. Anterolisthesis of C3 on C4 measuring 3 mm. Facets maintain alignment.  Craniocervical junction is aligned. Skull base and vertebrae: No acute fracture. Skull base is aligned. No aggressive bony lesions. Soft tissues and spinal canal: No canal hematoma. Calcifications of the carotid vasculature. No adenopathy. Disc levels: C2-C3: Mild disc disease without significant foraminal narrowing or canal narrowing. Early uncovertebral joint disease. C3-C4: Left greater than right foraminal narrowing secondary to uncovertebral joint disease and facet disease. C4-C5: Bilateral foraminal narrowing secondary to uncovertebral joint disease and facet spurring. C5-C6: Bilateral foraminal narrowing secondary to uncovertebral joint disease and facet disease. C6-C7: Bilateral foraminal narrowing secondary to uncovertebral joint disease and facet disease. C7-T1: No significant canal narrowing. No significant foraminal narrowing. Upper chest: Negative. Other: None IMPRESSION: Head CT: No acute intracranial abnormality. Mild brain  volume loss. Intracranial atherosclerosis. Cervical CT: No acute fracture or malalignment of the cervical spine. Multilevel degenerative changes as above. Electronically Signed   By: Corrie Mckusick D.O.   On: 10/09/2018 17:47   Ct Cervical Spine Wo Contrast  Result Date: 10/09/2018 CLINICAL DATA:  84 year old female with a history of fall EXAM: CT HEAD WITHOUT CONTRAST CT CERVICAL SPINE WITHOUT CONTRAST TECHNIQUE: Multidetector CT imaging of the head and cervical spine was performed following the standard protocol without intravenous contrast. Multiplanar CT image reconstructions of the cervical spine were also generated. COMPARISON:  None. FINDINGS: CT HEAD FINDINGS Brain: No acute intracranial hemorrhage. No midline shift or mass effect. Gray-white differentiation maintained. Unremarkable appearance of the ventricular system. Mild volume loss. Vascular: Calcifications of the intracranial vasculature. Skull: No acute fracture.  No aggressive bone lesion identified. Sinuses/Orbits: Unremarkable appearance of the orbits. Mastoid air cells clear. No middle ear effusion. No significant sinus disease. Other: None CT CERVICAL SPINE FINDINGS Alignment: Straightening of the normal cervical lordosis, potentially positional. Anterolisthesis of C3 on C4 measuring 3 mm. Facets maintain alignment.  Craniocervical junction is aligned. Skull base and vertebrae: No acute fracture. Skull base is aligned. No aggressive bony lesions. Soft tissues and spinal canal: No canal hematoma. Calcifications of the carotid vasculature. No adenopathy. Disc levels: C2-C3: Mild disc disease without significant foraminal narrowing or canal narrowing. Early uncovertebral joint disease. C3-C4: Left greater than right foraminal narrowing secondary to uncovertebral joint disease and facet disease. C4-C5: Bilateral foraminal narrowing secondary to uncovertebral joint disease and facet spurring. C5-C6: Bilateral foraminal narrowing secondary to  uncovertebral joint disease and facet disease. C6-C7: Bilateral foraminal narrowing secondary to uncovertebral joint disease and facet disease. C7-T1: No significant canal narrowing. No significant foraminal narrowing. Upper chest: Negative. Other: None IMPRESSION: Head CT: No acute intracranial abnormality. Mild brain volume loss. Intracranial atherosclerosis. Cervical CT: No acute fracture or malalignment of the cervical spine. Multilevel degenerative changes as above. Electronically Signed   By: Corrie Mckusick D.O.   On: 10/09/2018 17:47   US Abdomen Limited  Result Date: 10/13/2018 CLINICAL DATA:  Elevated LFTs EXAM: ULTRASOUND ABDOMEN LIMITED RIGHT UPPER QUADRANT COMPARISON:  None. FINDINGS: Gallbladder: Under distended. No gallstones or wall thickening. Wall thickness measures 2.4 mm. No  Murphy's sign or pericholecystic fluid. Common bile duct: Diameter: 3.1 mm Liver: No focal lesion identified. Within normal limits in parenchymal echogenicity. Portal vein is patent on color Doppler imaging with normal direction of blood flow towards the liver. IMPRESSION: Normal right upper quadrant ultrasound for age Electronically Signed   By: Jerilynn Mages.  Shick M.D.   On: 10/13/2018 09:55   Dg Hip Unilat W Or Wo Pelvis 2-3 Views Right  Result Date: 10/09/2018 CLINICAL DATA:  Pelvic and RIGHT hip pain, fell earlier today EXAM: DG HIP (WITH OR WITHOUT PELVIS) 2-3V RIGHT COMPARISON:  No 03/02/2007 FINDINGS: Osseous demineralization. RIGHT hip prosthesis. Degenerative changes LEFT hip joint with significant joint space narrowing. Degenerative disc and facet disease changes at visualized lower lumbar spine. No acute fracture, dislocation, or bone destruction. IMPRESSION: RIGHT hip prosthesis. Osseous demineralization with degenerative changes LEFT hip joint. No acute abnormalities. Electronically Signed   By: Lavonia Dana M.D.   On: 10/09/2018 17:31    Microbiology: Recent Results (from the past 240 hour(s))  Culture, blood  (Routine x 2)     Status: None (Preliminary result)   Collection Time: 10/09/18  2:55 PM  Result Value Ref Range Status   Specimen Description   Final    RIGHT ANTECUBITAL Performed at [facility], [place] [address] [place], [place] [address]    Special Requests   Final    BOTTLES DRAWN AEROBIC AND ANAEROBIC Blood Culture adequate volume Performed at [facility] [address], [place], [place] [address]    Culture   Final    NO GROWTH 4 DAYS Performed at [facility], [place] [address] [place], [place] [address]    Report Status PENDING  Incomplete  Culture, blood (Routine x 2)     Status: None (Preliminary result)   Collection Time: 10/09/18  3:00 PM  Result Value Ref Range Status   Specimen Description   Final    LEFT ANTECUBITAL Performed at [facility] [address], [place], [place] [address]    Special Requests   Final    BOTTLES DRAWN AEROBIC AND ANAEROBIC Blood Culture results may not be optimal due to an excessive volume of blood received in culture bottles Performed at [facility] [address], [place], [place] [address]    Culture   Final    NO GROWTH 4 DAYS Performed at [facility], [place] [address], [place], [place] [address]    Report Status PENDING  Incomplete  Urine culture     Status: None   Collection Time: 10/09/18  4:19 PM  Result Value Ref Range Status   Specimen Description   Final    URINE, CATHETERIZED Performed at [facility] [address], [place], [place] [address]    Special Requests   Final    NONE Performed at [facility], [place] [address] [place], [place] [address]    Culture   Final    NO GROWTH Performed at [facility], [place] [address], [place], [place] [address]    Report Status 10/10/2018 FINAL  Final  Respiratory Panel by PCR     Status: None   Collection Time: 10/09/18  7:01 PM  Result Value  Ref Range Status   Adenovirus NOT DETECTED NOT DETECTED Final   Coronavirus 229E NOT DETECTED NOT DETECTED Final   Coronavirus HKU1 NOT DETECTED NOT DETECTED Final   Coronavirus NL63 NOT DETECTED NOT DETECTED Final  Coronavirus OC43 NOT DETECTED NOT DETECTED Final   Metapneumovirus NOT DETECTED NOT DETECTED Final   Rhinovirus / Enterovirus NOT DETECTED NOT DETECTED Final   Influenza A NOT DETECTED NOT DETECTED Final   Influenza B NOT DETECTED NOT DETECTED Final   Parainfluenza Virus 1 NOT DETECTED NOT DETECTED Final   Parainfluenza Virus 2 NOT DETECTED NOT DETECTED Final   Parainfluenza Virus 3 NOT DETECTED NOT DETECTED Final   Parainfluenza Virus 4 NOT DETECTED NOT DETECTED Final   Respiratory Syncytial Virus NOT DETECTED NOT DETECTED Final   Bordetella pertussis NOT DETECTED NOT DETECTED Final   Chlamydophila pneumoniae NOT DETECTED NOT DETECTED Final   Mycoplasma pneumoniae NOT DETECTED NOT DETECTED Final    Comment: Performed at [facility], [place] [address] [place], [place] [address]  MRSA PCR Screening     Status: None   Collection Time: 10/10/18 12:39 PM  Result Value Ref Range Status   MRSA by PCR NEGATIVE NEGATIVE Final    Comment:        The GeneXpert MRSA Assay (FDA approved for NASAL specimens only), is one component of a comprehensive MRSA colonization surveillance program. It is not intended to diagnose MRSA infection nor to guide or monitor treatment for MRSA infections. Performed at [facility], [place] [address], [place],  [address]      Labs: Basic Metabolic Panel: Recent Labs  Lab 10/09/18 1455 10/10/18 0514 10/11/18 0303 10/12/18 0528 10/13/18 0551  NA 136 137 135 136 139  K 4.1 4.2 4.6 3.7 3.4*  CL 101 104 105 102 101  CO2 24 25 22 26 29   GLUCOSE 231* 187* 258* 158* 182*  BUN 12 15 16 17 17   CREATININE 1.11* 1.02* 0.78 0.77 0.86  CALCIUM 8.4* 7.9* 8.1* 8.2* 8.4*  MG  --  1.6*  --  1.8 1.6*  PHOS  --  3.0  --    --   --    Liver Function Tests: Recent Labs  Lab 10/09/18 1455 10/10/18 0514 10/12/18 0528 10/13/18 0551  AST 36 43* 45* 76*  ALT 23 23 38 72*  ALKPHOS 51 46 44 56  BILITOT 1.6* 1.2 1.2 1.2  PROT 7.0 6.3* 6.1* 6.2*  ALBUMIN 3.9 3.4* 3.2* 3.1*   No results for input(s): LIPASE, AMYLASE in the last 168 hours. No results for input(s): AMMONIA in the last 168 hours. CBC: Recent Labs  Lab 10/09/18 1455 10/10/18 0514 10/11/18 0303 10/12/18 0528 10/13/18 0551  WBC 6.3 5.6 4.9 4.2 4.2  NEUTROABS 5.4  --  3.6 2.2 2.2  HGB 12.3 11.7* 10.6* 10.9* 11.3*  HCT 38.8 36.4 33.8* 33.7* 35.1*  MCV 99.0 97.3 97.4 96.6 96.7  PLT 111* 98* 85* 98* 122*   Cardiac Enzymes: Recent Labs  Lab 10/09/18 1502 10/10/18 0058 10/10/18 0514 10/10/18 1001  TROPONINI <0.03 0.04* 0.05* 0.04*   BNP: BNP (last 3 results) Recent Labs    10/09/18 1502  BNP 357.2*    ProBNP (last 3 results) No results for input(s): PROBNP in the last 8760 hours.  CBG: Recent Labs  Lab 10/12/18 0742 10/12/18 1204 10/12/18 1746 10/12/18 2125 10/13/18 0752  GLUCAP 151* 166* 188* 162* 172*       Signed:  Florencia Reasons MD, PhD  [facility] 10/13/2018, 12:06 PM

## 2018-10-13 NOTE — Progress Notes (Signed)
Physical Therapy Treatment Patient Details Name: Debra Campbell MRN: [id number] DOB: [birth year] Today's Date: 10/13/2018    History of Present Illness 84 y.o patient admitted with SIRS. Has a medical history of type 2 Dm, hip edema, persistent atrial fibrillation, hypothyroidism, diabetic polyneuropathy, morbid obesity, L TKA January 2018. Patient presented to the ED after a fall and confusion. .     PT Comments    Patient progressing with distance with ambulation and seemed to tolerate increased ambulation with O2 today.  Did educate about home O2 and how to manage tubing.  Continue to recommend HHPT for safety and to progress independence at d/c.    Follow Up Recommendations  Home health PT     Equipment Recommendations  None recommended by PT    Recommendations for Other Services       Precautions / Restrictions Precautions Precautions: Fall;Other (comment) Precaution Comments: monitor O2 Restrictions Weight Bearing Restrictions: No    Mobility  Bed Mobility Overal bed mobility: Modified Independent             General bed mobility comments: in recliner  Transfers Overall transfer level: Needs assistance Equipment used: Rolling walker (2 wheeled) Transfers: Sit to/from Stand Sit to Stand: Min guard         General transfer comment: for safety and cues for UE placement  Ambulation/Gait Ambulation/Gait assistance: Supervision;Min guard Gait Distance (Feet): 230 Feet Assistive device: Rolling walker (2 wheeled) Gait Pattern/deviations: Step-through pattern;Decreased stride length;Shuffle     General Gait Details: on RA SpO2 87%, on 2L O2 SpO2 92%.  with walker stable in hallway   Stairs             Wheelchair Mobility    Modified Rankin (Stroke Patients Only)       Balance Overall balance assessment: Needs assistance   Sitting balance-Leahy Scale: Good       Standing balance-Leahy Scale: Fair Standing balance comment: needs UE  support for ambulation                            Cognition Arousal/Alertness: Awake/alert Behavior During Therapy: WFL for tasks assessed/performed Overall Cognitive Status: Within Functional Limits for tasks assessed                                        Exercises      General Comments General comments (skin integrity, edema, etc.): neice in room and doting on patient and reports they go shopping a lot (just so she can get pt out of the house)      Pertinent Vitals/Pain Pain Assessment: No/denies pain    Home Living                      Prior Function            PT Goals (current goals can now be found in the care plan section) Progress towards PT goals: Progressing toward goals    Frequency    Min 3X/week      PT Plan Current plan remains appropriate    Co-evaluation              AM-PAC PT "6 Clicks" Daily Activity  Outcome Measure  Difficulty turning over in bed (including adjusting bedclothes, sheets and blankets)?: A Lot Difficulty moving from lying on back to sitting on the  side of the bed? : A Lot Difficulty sitting down on and standing up from a chair with arms (e.g., wheelchair, bedside commode, etc,.)?: Unable Help needed moving to and from a bed to chair (including a wheelchair)?: A Little Help needed walking in hospital room?: A Little Help needed climbing 3-5 steps with a railing? : A Little 6 Click Score: 14    End of Session Equipment Utilized During Treatment: Gait belt;Oxygen Activity Tolerance: Patient tolerated treatment well Patient left: with call bell/phone within reach;in chair;with family/visitor present Nurse Communication: Other (comment)(SpO2 on RA) PT Visit Diagnosis: Other abnormalities of gait and mobility (R26.89);Muscle weakness (generalized) (M62.81)     Time: 1035-1100 PT Time Calculation (min) (ACUTE ONLY): 25 min  Charges:  $Gait Training: 8-22 mins $Therapeutic Activity:  8-22 mins                     Debra Campbell, [place] [phone] 10/13/2018    Debra Campbell 10/13/2018, 12:52 PM

## 2018-10-14 LAB — CULTURE, BLOOD (ROUTINE X 2)
CULTURE: NO GROWTH
CULTURE: NO GROWTH
Special Requests: ADEQUATE

## 2018-10-15 DIAGNOSIS — Z90722 Acquired absence of ovaries, bilateral: Secondary | ICD-10-CM | POA: Diagnosis not present

## 2018-10-15 DIAGNOSIS — I4819 Other persistent atrial fibrillation: Secondary | ICD-10-CM | POA: Diagnosis not present

## 2018-10-15 DIAGNOSIS — E1122 Type 2 diabetes mellitus with diabetic chronic kidney disease: Secondary | ICD-10-CM | POA: Diagnosis not present

## 2018-10-15 DIAGNOSIS — Z6839 Body mass index (BMI) 39.0-39.9, adult: Secondary | ICD-10-CM | POA: Diagnosis not present

## 2018-10-15 DIAGNOSIS — M1990 Unspecified osteoarthritis, unspecified site: Secondary | ICD-10-CM | POA: Diagnosis not present

## 2018-10-15 DIAGNOSIS — J9601 Acute respiratory failure with hypoxia: Secondary | ICD-10-CM | POA: Diagnosis not present

## 2018-10-15 DIAGNOSIS — Z7901 Long term (current) use of anticoagulants: Secondary | ICD-10-CM | POA: Diagnosis not present

## 2018-10-15 DIAGNOSIS — A419 Sepsis, unspecified organism: Secondary | ICD-10-CM | POA: Diagnosis not present

## 2018-10-15 DIAGNOSIS — Z96652 Presence of left artificial knee joint: Secondary | ICD-10-CM | POA: Diagnosis not present

## 2018-10-15 DIAGNOSIS — E1142 Type 2 diabetes mellitus with diabetic polyneuropathy: Secondary | ICD-10-CM | POA: Diagnosis not present

## 2018-10-15 DIAGNOSIS — N182 Chronic kidney disease, stage 2 (mild): Secondary | ICD-10-CM | POA: Diagnosis not present

## 2018-10-15 DIAGNOSIS — Z90710 Acquired absence of both cervix and uterus: Secondary | ICD-10-CM | POA: Diagnosis not present

## 2018-10-15 DIAGNOSIS — I5033 Acute on chronic diastolic (congestive) heart failure: Secondary | ICD-10-CM | POA: Diagnosis not present

## 2018-10-15 DIAGNOSIS — I130 Hypertensive heart and chronic kidney disease with heart failure and stage 1 through stage 4 chronic kidney disease, or unspecified chronic kidney disease: Secondary | ICD-10-CM | POA: Diagnosis not present

## 2018-10-15 DIAGNOSIS — Z7984 Long term (current) use of oral hypoglycemic drugs: Secondary | ICD-10-CM | POA: Diagnosis not present

## 2018-10-15 DIAGNOSIS — Z96641 Presence of right artificial hip joint: Secondary | ICD-10-CM | POA: Diagnosis not present

## 2018-10-15 DIAGNOSIS — Z8542 Personal history of malignant neoplasm of other parts of uterus: Secondary | ICD-10-CM | POA: Diagnosis not present

## 2018-10-17 ENCOUNTER — Encounter: Payer: Self-pay | Admitting: Nurse Practitioner

## 2018-10-17 ENCOUNTER — Ambulatory Visit (INDEPENDENT_AMBULATORY_CARE_PROVIDER_SITE_OTHER): Payer: Medicare Other | Admitting: Nurse Practitioner

## 2018-10-17 VITALS — BP 130/76 | HR 74 | Temp 96.8°F | Ht 64.0 in | Wt 219.0 lb

## 2018-10-17 DIAGNOSIS — A419 Sepsis, unspecified organism: Secondary | ICD-10-CM | POA: Diagnosis not present

## 2018-10-17 DIAGNOSIS — J9601 Acute respiratory failure with hypoxia: Secondary | ICD-10-CM | POA: Diagnosis not present

## 2018-10-17 DIAGNOSIS — Z23 Encounter for immunization: Secondary | ICD-10-CM | POA: Diagnosis not present

## 2018-10-17 DIAGNOSIS — I130 Hypertensive heart and chronic kidney disease with heart failure and stage 1 through stage 4 chronic kidney disease, or unspecified chronic kidney disease: Secondary | ICD-10-CM | POA: Diagnosis not present

## 2018-10-17 DIAGNOSIS — I5033 Acute on chronic diastolic (congestive) heart failure: Secondary | ICD-10-CM | POA: Diagnosis not present

## 2018-10-17 DIAGNOSIS — E1122 Type 2 diabetes mellitus with diabetic chronic kidney disease: Secondary | ICD-10-CM | POA: Diagnosis not present

## 2018-10-17 DIAGNOSIS — N182 Chronic kidney disease, stage 2 (mild): Secondary | ICD-10-CM | POA: Diagnosis not present

## 2018-10-17 LAB — GLUCOSE, CAPILLARY: GLUCOSE-CAPILLARY: 169 mg/dL — AB (ref 70–99)

## 2018-10-17 NOTE — Patient Instructions (Signed)

## 2018-10-17 NOTE — Progress Notes (Signed)
Subjective:    Patient ID: JOEY HUDOCK, female    DOB: 22-Feb-1932, 84 y.o.   MRN: [id number]   Chief Complaint: hospital follow up  HPI:  Patient was admitted to the hospital on 10/09/18 with sepsis. She was discharged on 10/13/18. Her hospital stay was unremarkable.. They have requested that we refer her for sleep study because of her obesity. Patient says she is doing well without complications from hospital stay. She feels a little weak but other then that she is well.  Outpatient Encounter Medications as of 10/17/2018  Medication Sig  . acetaminophen (TYLENOL) 500 MG tablet Take 1 tablet (500 mg total) by mouth every 8 (eight) hours as needed.  . Blood Glucose Monitoring Suppl (ACCU-CHEK AVIVA) device Use as instructed  . furosemide (LASIX) 40 MG tablet Take 1 tablet (40 mg total) by mouth 2 (two) times daily.  [name] gabapentin (NEURONTIN) 300 MG capsule Take 1 capsule (300 mg total) by mouth 2 (two) times daily.  [name] glucose blood (ACCU-CHEK AVIVA PLUS) test strip Test 1x per day and prn. DX.E11.9  . levothyroxine (SYNTHROID, LEVOTHROID) 175 MCG tablet Take 1 tablet (175 mcg total) by mouth daily before breakfast.  . losartan (COZAAR) 25 MG tablet Take 0.5 tablets (12.5 mg total) by mouth daily.  . magnesium gluconate (MAGONATE) 30 MG tablet Take 1 tablet (30 mg total) by mouth daily.  . metFORMIN (GLUCOPHAGE) 1000 MG tablet TAKE 1 TABLET TWICE DAILY WITH A MEAL (Patient taking differently: Take 500 mg by mouth 2 (two) times daily with a meal. TAKE 1 TABLET TWICE DAILY WITH A MEAL)  . metoprolol succinate (TOPROL-XL) 25 MG 24 hr tablet Take 1 tablet (25 mg total) by mouth daily.  [name] nystatin cream (MYCOSTATIN) Apply 1 application topically 2 (two) times daily.  . potassium chloride (K-DUR) 10 MEQ tablet Take 1 tablet (10 mEq total) by mouth every Sunday.  . rivaroxaban (XARELTO) 20 MG TABS tablet TAKE 1 TABLET EVERY DAY WITH BREAKFAST  . triamcinolone cream (KENALOG) 0.5 % Apply 1  application topically 3 (three) times daily as needed. (Patient taking differently: Apply 1 application topically 3 (three) times daily as needed (rash). )  . TRUEPLUS LANCETS 30G MISC CHECK BLOOD SUGAR ONCE A DAY AND AS NEEDED  . vitamin B-12 1000 MCG tablet Take 1 tablet (1,000 mcg total) by mouth daily.   No facility-administered encounter medications on file as of 10/17/2018.       New complaints: None today  Social history: Lives with her husband and she has a niece that checks n her frequently   Review of Systems  Constitutional: Negative.   HENT: Negative.   Respiratory: Negative.   Cardiovascular: Negative.   Gastrointestinal: Negative.   Genitourinary: Negative.   Neurological: Negative.   Psychiatric/Behavioral: Negative.   All other systems reviewed and are negative.      Objective:   Physical Exam  Constitutional: She is oriented to person, place, and time. She appears well-developed and well-nourished. No distress.  Pulmonary/Chest: Effort normal and breath sounds normal.  Musculoskeletal:  Gait unsteady, walking with walker  Neurological: She is alert and oriented to person, place, and time.  Skin: Skin is warm.  Psychiatric: She has a normal mood and affect. Her behavior is normal. Thought content normal.    BP 130/76   Pulse 74   Temp (!) 96.8 F (36 C) (Oral)   Ht 5' 4"  (1.626 m)   Wt 219 lb (99.3 kg)   SpO2 96%  BMI 37.59 kg/m       Assessment & Plan:  BRANDYCE DIMARIO in today with chief complaint of Hospitalization Follow-up   1. Sepsis without acute organ dysfunction, due to unspecified organism [facility]) Repeat labs as requested on hospital discharge - BMP8+EGFR - CBC with Differential/Platelet  2. Morbid obesity [facility]) Hospital suggested sleep study but patient refused. Says that sh ehas no problems sleeping.  Hospital records reviewed  [place], [place]

## 2018-10-18 LAB — BMP8+EGFR
BUN/Creatinine Ratio: 12 (ref 12–28)
BUN: 12 mg/dL (ref 8–27)
CO2: 24 mmol/L (ref 20–29)
CREATININE: 1.02 mg/dL — AB (ref 0.57–1.00)
Calcium: 9.5 mg/dL (ref 8.7–10.3)
Chloride: 96 mmol/L (ref 96–106)
GFR calc Af Amer: 58 mL/min/{1.73_m2} — ABNORMAL LOW (ref >59)
GFR, EST NON AFRICAN AMERICAN: 51 mL/min/{1.73_m2} — AB (ref >59)
Glucose: 130 mg/dL — ABNORMAL HIGH (ref 65–99)
Potassium: 4 mmol/L (ref 3.5–5.2)
SODIUM: 140 mmol/L (ref 134–144)

## 2018-10-18 LAB — CBC WITH DIFFERENTIAL/PLATELET
BASOS: 1 %
Basophils Absolute: 0.1 10*3/uL (ref 0.0–0.2)
EOS (ABSOLUTE): 0.2 10*3/uL (ref 0.0–0.4)
EOS: 3 %
HEMATOCRIT: 38.9 % (ref 34.0–46.6)
HEMOGLOBIN: 13.1 g/dL (ref 11.1–15.9)
IMMATURE GRANULOCYTES: 1 %
Immature Grans (Abs): 0.1 10*3/uL (ref 0.0–0.1)
Lymphocytes Absolute: 1.4 10*3/uL (ref 0.7–3.1)
Lymphs: 22 %
MCH: 30.7 pg (ref 26.6–33.0)
MCHC: 33.7 g/dL (ref 31.5–35.7)
MCV: 91 fL (ref 79–97)
MONOCYTES: 9 %
MONOS ABS: 0.6 10*3/uL (ref 0.1–0.9)
Neutrophils Absolute: 4.2 10*3/uL (ref 1.4–7.0)
Neutrophils: 64 %
Platelets: 302 10*3/uL (ref 150–450)
RBC: 4.27 x10E6/uL (ref 3.77–5.28)
RDW: 12.9 % (ref 12.3–15.4)
WBC: 6.5 10*3/uL (ref 3.4–10.8)

## 2018-10-19 DIAGNOSIS — A419 Sepsis, unspecified organism: Secondary | ICD-10-CM | POA: Diagnosis not present

## 2018-10-19 DIAGNOSIS — I130 Hypertensive heart and chronic kidney disease with heart failure and stage 1 through stage 4 chronic kidney disease, or unspecified chronic kidney disease: Secondary | ICD-10-CM | POA: Diagnosis not present

## 2018-10-19 DIAGNOSIS — J9601 Acute respiratory failure with hypoxia: Secondary | ICD-10-CM | POA: Diagnosis not present

## 2018-10-19 DIAGNOSIS — E1122 Type 2 diabetes mellitus with diabetic chronic kidney disease: Secondary | ICD-10-CM | POA: Diagnosis not present

## 2018-10-19 DIAGNOSIS — N182 Chronic kidney disease, stage 2 (mild): Secondary | ICD-10-CM | POA: Diagnosis not present

## 2018-10-19 DIAGNOSIS — I5033 Acute on chronic diastolic (congestive) heart failure: Secondary | ICD-10-CM | POA: Diagnosis not present

## 2018-10-23 DIAGNOSIS — N182 Chronic kidney disease, stage 2 (mild): Secondary | ICD-10-CM | POA: Diagnosis not present

## 2018-10-23 DIAGNOSIS — J9601 Acute respiratory failure with hypoxia: Secondary | ICD-10-CM | POA: Diagnosis not present

## 2018-10-23 DIAGNOSIS — E1122 Type 2 diabetes mellitus with diabetic chronic kidney disease: Secondary | ICD-10-CM | POA: Diagnosis not present

## 2018-10-23 DIAGNOSIS — I130 Hypertensive heart and chronic kidney disease with heart failure and stage 1 through stage 4 chronic kidney disease, or unspecified chronic kidney disease: Secondary | ICD-10-CM | POA: Diagnosis not present

## 2018-10-23 DIAGNOSIS — A419 Sepsis, unspecified organism: Secondary | ICD-10-CM | POA: Diagnosis not present

## 2018-10-23 DIAGNOSIS — I5033 Acute on chronic diastolic (congestive) heart failure: Secondary | ICD-10-CM | POA: Diagnosis not present

## 2018-10-24 DIAGNOSIS — N182 Chronic kidney disease, stage 2 (mild): Secondary | ICD-10-CM | POA: Diagnosis not present

## 2018-10-24 DIAGNOSIS — I130 Hypertensive heart and chronic kidney disease with heart failure and stage 1 through stage 4 chronic kidney disease, or unspecified chronic kidney disease: Secondary | ICD-10-CM | POA: Diagnosis not present

## 2018-10-24 DIAGNOSIS — A419 Sepsis, unspecified organism: Secondary | ICD-10-CM | POA: Diagnosis not present

## 2018-10-24 DIAGNOSIS — E1122 Type 2 diabetes mellitus with diabetic chronic kidney disease: Secondary | ICD-10-CM | POA: Diagnosis not present

## 2018-10-24 DIAGNOSIS — I5033 Acute on chronic diastolic (congestive) heart failure: Secondary | ICD-10-CM | POA: Diagnosis not present

## 2018-10-24 DIAGNOSIS — J9601 Acute respiratory failure with hypoxia: Secondary | ICD-10-CM | POA: Diagnosis not present

## 2018-10-25 ENCOUNTER — Ambulatory Visit (INDEPENDENT_AMBULATORY_CARE_PROVIDER_SITE_OTHER): Payer: Medicare Other

## 2018-10-25 ENCOUNTER — Encounter: Payer: Self-pay | Admitting: Cardiology

## 2018-10-25 ENCOUNTER — Ambulatory Visit (INDEPENDENT_AMBULATORY_CARE_PROVIDER_SITE_OTHER): Payer: Medicare Other | Admitting: Cardiology

## 2018-10-25 VITALS — BP 110/60 | HR 85 | Ht 64.0 in | Wt 220.2 lb

## 2018-10-25 DIAGNOSIS — A419 Sepsis, unspecified organism: Secondary | ICD-10-CM

## 2018-10-25 DIAGNOSIS — E782 Mixed hyperlipidemia: Secondary | ICD-10-CM | POA: Diagnosis not present

## 2018-10-25 DIAGNOSIS — I10 Essential (primary) hypertension: Secondary | ICD-10-CM

## 2018-10-25 DIAGNOSIS — R609 Edema, unspecified: Secondary | ICD-10-CM | POA: Diagnosis not present

## 2018-10-25 DIAGNOSIS — I130 Hypertensive heart and chronic kidney disease with heart failure and stage 1 through stage 4 chronic kidney disease, or unspecified chronic kidney disease: Secondary | ICD-10-CM

## 2018-10-25 DIAGNOSIS — Z6839 Body mass index (BMI) 39.0-39.9, adult: Secondary | ICD-10-CM | POA: Diagnosis not present

## 2018-10-25 DIAGNOSIS — M1990 Unspecified osteoarthritis, unspecified site: Secondary | ICD-10-CM

## 2018-10-25 DIAGNOSIS — J9601 Acute respiratory failure with hypoxia: Secondary | ICD-10-CM

## 2018-10-25 DIAGNOSIS — I4819 Other persistent atrial fibrillation: Secondary | ICD-10-CM | POA: Diagnosis not present

## 2018-10-25 DIAGNOSIS — E1122 Type 2 diabetes mellitus with diabetic chronic kidney disease: Secondary | ICD-10-CM | POA: Diagnosis not present

## 2018-10-25 DIAGNOSIS — I5033 Acute on chronic diastolic (congestive) heart failure: Secondary | ICD-10-CM

## 2018-10-25 DIAGNOSIS — E1142 Type 2 diabetes mellitus with diabetic polyneuropathy: Secondary | ICD-10-CM | POA: Diagnosis not present

## 2018-10-25 DIAGNOSIS — I272 Pulmonary hypertension, unspecified: Secondary | ICD-10-CM | POA: Diagnosis not present

## 2018-10-25 DIAGNOSIS — N182 Chronic kidney disease, stage 2 (mild): Secondary | ICD-10-CM

## 2018-10-25 MED ORDER — FUROSEMIDE 40 MG PO TABS: 40.0000 mg | ORAL_TABLET | Freq: Two times a day (BID) | ORAL | 3 refills | Status: DC

## 2018-10-25 MED ORDER — RIVAROXABAN 20 MG PO TABS: ORAL_TABLET | ORAL | 1 refills | Status: DC

## 2018-10-25 NOTE — Progress Notes (Signed)
PCP: Chevis Pretty, FNP  Clinic Note: Chief Complaint  Patient presents with  . Hospitalization Follow-up    Admitted with UTI sepsis -complicated by worsening edema/CHF  . Atrial Fibrillation  . Leg Swelling    Chronic    HPI: Debra Campbell is a 84 y.o. female with a PMH notable for PAF who presents today for ~ delayed 6 month/hospital follow-up.  CHA2DS2-VASc Score and unadjusted Ischemic Stroke Rate (% per year) is equal to 7.2 % stroke rate/year from a score of 5 (HTN, DM, Female, Age (2)).  -- on [place] was last seen in Feb 2019 -- was felling well.  No major cardiac Sx - no sensation of Afib.  Noted GERD.  Back & shoulder pain.  Memory loss.   Recent Hospitalizations:   11/11-15/19: UTI sepsis.  Planned OP OSA sleep study.. Noted progressive edema & wgt gain.  (chronically sleeps in recliner) -- put on Lasix 40 mg BID.  Clinic visit for cellulitis in Sept   Studies Personally Reviewed - (if available, images/films reviewed: From Epic Chart or Care Everywhere)  Echo 10/12/18: EF 60-65%. Mild AS.  Mod-Severe PAH 60 mmHg  Interval History: Debra Campbell presents today pretty much for hospital follow-up.  She did note that her edema got much worse while she was in the hospital and they had her on Lasix for a while.  She is now back wearing her support stockings (was initially told not to wear them when her legs were weeping back in September). She denies any sensation of irregular heartbeats palpitations.  She has stable baseline orthopnea sleeping somewhat upright (by her report because of GERD), no change.  No PND.  She also has stable chronic lower extremity edema--as much from venous stasis as a cardiac etiology.  However now pulmonary pretension seen on echo. She denies any syncope/near syncope or TIA/amaurosis fugax.  No rapid heartbeat sensations.  No claudication.  Somewhat limited mobility with poor balance and aches and pains..   ROS: A  comprehensive was performed. Review of Systems  Constitutional: Negative for malaise/fatigue.  HENT: Negative for congestion and nosebleeds.   Respiratory: Negative for cough, shortness of breath and wheezing.   Cardiovascular: Positive for leg swelling.  Gastrointestinal: Positive for heartburn. Negative for abdominal pain, blood in stool, melena, nausea and vomiting.  Genitourinary: Negative for frequency and hematuria.  Musculoskeletal: Positive for back pain and joint pain (Shoulder pain).  Neurological: Positive for dizziness (Rarely when she first stands up).  Psychiatric/Behavioral: Positive for memory loss (She seems to be having some, but does not admit to it). Negative for depression. The patient is not nervous/anxious.   All other systems reviewed and are negative.  I have reviewed and (if needed) personally updated the patient's problem list, medications, allergies, past medical and surgical history, social and family history.   Past Medical History:  Diagnosis Date  . Aortic calcification ([place]) 2013   Atherosclerotic calcifications of the abdominal aorta and branch noted on CT Abd/Pelvis  . Arthritis    legs, lower back, hands  . Cancer ([place])    endometrial  . Diabetes mellitus   . Ectopic pregnancy   . Eczema   . Headache(784.0)    hx of  . History of kidney stones   . Hyperlipidemia    no meds  . Hypertension   . Hypothyroidism   . Persistent atrial fibrillation 09/28/2016   Relatively new Dx: Asymptomatic.  Rate controlled without medication.  . SVD (  spontaneous vaginal delivery)    x 3  . Thyroid disease     Past Surgical History:  Procedure Laterality Date  . ABDOMINAL HYSTERECTOMY Bilateral 06/26/2013   Procedure: TOTAL ABDOMINAL HYSTERECTOMY WITH BILATERAL SALPINGO OOPHERECTOMY WITH PELVIC LYMPHADNECTOMY;  Surgeon: Alvino Chapel, MD;  Location: [facility];  Service: Gynecology;  Laterality: Bilateral;  . BACK SURGERY  06/26/2013  . ECTOPIC  PREGNANCY SURGERY    . HERNIA REPAIR    . HYSTEROSCOPY W/D&C N/A 04/26/2013   Procedure: DILATATION AND CURETTAGE /HYSTEROSCOPY;  Surgeon: Woodroe Mode, MD;  Location: [facility];  Service: Gynecology;  Laterality: N/A;  . JOINT REPLACEMENT    . LAPAROTOMY     for ectopic pregnancy  . LAPAROTOMY N/A 06/26/2013   Procedure: EXPLORATORY LAPAROTOMY;  Surgeon: Alvino Chapel, MD;  Location: [facility];  Service: Gynecology;  Laterality: N/A;  . lt knee arthroscopy    . rt total hip replacement    . TONSILLECTOMY    . TOTAL KNEE ARTHROPLASTY Left 12/22/2016   Procedure: LEFT TOTAL KNEE ARTHROPLASTY;  Surgeon: Latanya Maudlin, MD;  Location: [facility];  Service: Orthopedics;  Laterality: Left;  . TRANSTHORACIC ECHOCARDIOGRAM  09/2018   EF 60-65%. Mild AS.  Mod-Severe PAH 60 mmHg  . WISDOM TOOTH EXTRACTION      Current Meds  Medication Sig  . acetaminophen (TYLENOL) 500 MG tablet Take 1 tablet (500 mg total) by mouth every 8 (eight) hours as needed.  . Blood Glucose Monitoring Suppl (ACCU-CHEK AVIVA) device Use as instructed  . gabapentin (NEURONTIN) 300 MG capsule Take 1 capsule (300 mg total) by mouth 2 (two) times daily.  [name] glucose blood (ACCU-CHEK AVIVA PLUS) test strip Test 1x per day and prn. DX.E11.9  . levothyroxine (SYNTHROID, LEVOTHROID) 175 MCG tablet Take 1 tablet (175 mcg total) by mouth daily before breakfast.  . losartan (COZAAR) 25 MG tablet Take 0.5 tablets (12.5 mg total) by mouth daily.  . magnesium gluconate (MAGONATE) 30 MG tablet Take 1 tablet (30 mg total) by mouth daily.  . metFORMIN (GLUCOPHAGE) 1000 MG tablet TAKE 1 TABLET TWICE DAILY WITH A MEAL  . metoprolol succinate (TOPROL-XL) 25 MG 24 hr tablet Take 1 tablet (25 mg total) by mouth daily.  [name] nystatin cream (MYCOSTATIN) Apply 1 application topically 2 (two) times daily.  . potassium chloride (K-DUR) 10 MEQ tablet Take 1 tablet (10 mEq total) by mouth every Sunday.  . rivaroxaban (XARELTO) 20 MG TABS tablet TAKE 1  TABLET EVERY DAY WITH BREAKFAST  . triamcinolone cream (KENALOG) 0.5 % Apply 1 application topically 3 (three) times daily as needed.  . TRUEPLUS LANCETS 30G MISC CHECK BLOOD SUGAR ONCE A DAY AND AS NEEDED  . vitamin B-12 1000 MCG tablet Take 1 tablet (1,000 mcg total) by mouth daily.  . [DISCONTINUED] furosemide (LASIX) 40 MG tablet Take 1 tablet (40 mg total) by mouth 2 (two) times daily.  . [DISCONTINUED] rivaroxaban (XARELTO) 20 MG TABS tablet TAKE 1 TABLET EVERY DAY WITH BREAKFAST    Allergies  Allergen Reactions  . Ace Inhibitors Cough  . Iohexol      Desc: HIVES 40 YEARS AGO   . Statins     myalgia  . Penicillins Swelling and Rash    Has patient had a PCN reaction causing immediate rash, facial/tongue/throat swelling, SOB or lightheadedness with hypotension: Yes Has patient had a PCN reaction causing severe rash involving mucus membranes or skin necrosis: No Has patient had a PCN reaction that required hospitalization  Yes Has patient had a PCN reaction occurring within the last 10 years: No If all of the above answers are "NO", then may proceed with Cephalosporin use.   . Sulfa Antibiotics Rash    Social History   Tobacco Use  . Smoking status: Never Smoker  . Smokeless tobacco: Never Used  Substance Use Topics  . Alcohol use: No  . Drug use: No   Social History   Social History Narrative  . Not on file    Family History family history includes Arthritis in her mother; Breast cancer in her daughter and sister; Cancer in her daughter and sister; Heart attack in her father; Heart disease in her father and sister; Neuropathy in her sister; Suicidality (age of onset: 72) in her brother; Thyroid disease in her father.  Wt Readings from Last 3 Encounters:  10/25/18 220 lb 3.2 oz (99.9 kg)  10/17/18 219 lb (99.3 kg)  10/13/18 225 lb 1.4 oz (102.1 kg)  01/17/18 225 lb 9.6 oz  PHYSICAL EXAM BP 110/60   Pulse 85   Ht 5\' 4"  (1.626 m)   Wt 220 lb 3.2 oz (99.9 kg)    BMI 37.80 kg/m  Physical Exam  Constitutional: She is oriented to person, place, and time. She appears well-developed. No distress.  Obese  HENT:  Head: Normocephalic and atraumatic.  Hard of hearing  Neck: Normal range of motion. No hepatojugular reflux and no JVD present. Carotid bruit is not present. No tracheal deviation present. No thyromegaly present.  Cardiovascular: Normal rate, normal heart sounds and normal pulses. An irregularly irregular rhythm present. PMI is not displaced. Exam reveals no gallop.  No murmur heard. Pulmonary/Chest: Effort normal and breath sounds normal. No respiratory distress. She has no wheezes. She has no rales.  Abdominal: Soft. Bowel sounds are normal. She exhibits no distension. There is no tenderness. There is no rebound.  Obese  Musculoskeletal: Normal range of motion. She exhibits edema (2+ bilateral lower extremity with mild stasis changes).  Left knee is still somewhat stiff with somewhat antalgic gait, but notably improved from when I saw her last year. (Now is postop)  Neurological: She is alert and oriented to person, place, and time.  Skin:  Lower extremity venous stasis changes.  Psychiatric: She has a normal mood and affect. Her behavior is normal. Thought content normal.  Still feisty, but more adherent  Nursing note and vitals reviewed.    Adult ECG Report  Rate: 89;  Rhythm: atrial fibrillation and Otherwise normal axis, intervals and durations. Nonspecific ST and T-wave changes.;   Narrative Interpretation: Stable EKG   Other studies Reviewed: Additional studies/ records that were reviewed today include:  Recent Labs:   Lab Results  Component Value Date   CREATININE 1.02 (H) 10/17/2018   BUN 12 10/17/2018   NA 140 10/17/2018   K 4.0 10/17/2018   CL 96 10/17/2018   CO2 24 10/17/2018   Lab Results  Component Value Date   CHOL 190 07/25/2018   HDL 53 07/25/2018   LDLCALC 108 (H) 07/25/2018   TRIG 147 07/25/2018   CHOLHDL  3.6 07/25/2018    ASSESSMENT / PLAN: Problem List Items Addressed This Visit    Essential hypertension (Chronic)    Well-controlled blood pressure on losartan and Toprol.      Relevant Medications   rivaroxaban (XARELTO) 20 MG TABS tablet   furosemide (LASIX) 40 MG tablet   Hyperlipidemia (Chronic)    LDL of 108 on last check.  Not  currently on any treatment.  Defer to PCP.  Is pretty much close to goal for her age.      Relevant Medications   rivaroxaban (XARELTO) 20 MG TABS tablet   furosemide (LASIX) 40 MG tablet   Peripheral edema (Chronic)    Chronic persistent peripheral edema -at least in some part related to venous stasis.  As long as her skin is not weeping, I think she can wear the support stockings.  Clearly when there is cellulitis, she should not. She is on a standing dose of Lasix with sliding scale.  We discussed weightbearing sliding scale. CONTINUE TO FUROSEMIDE 40 MG  TWICE A DAY.  MAY   TAKE AN EXTRA 40 MG IN THE MORNING, IF  WEIGHT GAINS IS MORE THAN 3 LBS OVERNIGHT      Persistent atrial fibrillation - Primary (Chronic)    Remains asymptomatic.  Rate controlled on low-dose beta-blocker.  On Xarelto for anticoagulation.      Relevant Medications   rivaroxaban (XARELTO) 20 MG TABS tablet   furosemide (LASIX) 40 MG tablet   Other Relevant Orders   EKG 12-Lead   Pulmonary hypertension, unspecified (HCC)    Elevated PA pressures estimated on echo.  This could be contributing to her edema.  Does not seem to be related to diastolic dysfunction/diastolic heart failure as she is not really having the PND and orthopnea associated with it. Continue sliding scale Lasix.  Is on beta-blocker and ARB as blessed blood pressure will tolerate. Is supposedly going to have sleep study evaluation for possible OSA.  This could certainly contribute.  Apparently was desaturating while in the hospital, it may be considered benefit of at least oxygen while sleeping.      Relevant  Medications   rivaroxaban (XARELTO) 20 MG TABS tablet   furosemide (LASIX) 40 MG tablet      Over 25 minutes was spent with the patient and her niece today. >r 75% of the time was spent in direct patient counseling. Discussing pathophysiology of atrial fibrillation as well as concern for stroke prevention.  She was much more receptive and understanding of our thought process.  Much better grasp of the reasons for taking the current medications.   Current medicines are reviewed at length with the patient today. (+/- concerns)  - concerns about taking a new medication The following changes have been made:None  Patient Instructions  Medication Instructions:     CONTINUE TO FUROSEMIDE 40 MG  TWICE A DAY.  MAY   TAKE AN EXTRA 40 MG IN THE MORNING, IF  WEIGHT GAINS IS MORE THAN 3 LBS OVERNIGHT.   If you need a refill on your cardiac medications before your next appointment, please call your pharmacy.   Lab work: NOT NEEDED If you have labs (blood work) drawn today and your tests are completely normal, you will receive your results only by: [name] MyChart Message (if you have MyChart) OR . A paper copy in the mail If you have any lab test that is abnormal or we need to change your treatment, we will call you to review the results.  Testing/Procedures: NOT NEEDED  Follow-Up: At [facility], you and your health needs are our priority.  As part of our continuing mission to provide you with exceptional heart care, we have created designated Provider Care Teams.  These Care Teams include your primary Cardiologist (physician) and Advanced Practice Providers (APPs -  Physician Assistants and Nurse Practitioners) who all work together to provide you with  the care you need, when you need it. . Your physician recommends that you schedule a follow-up appointment in [place] . Your physician recommends that you schedule a follow-up appointment in [place] .   Any Other Special Instructions Will Be Listed Below (If Applicable).  PURCHASE SOME SUPPORT STOCKING/.SOCKS    Studies Ordered:   Orders Placed This Encounter  Procedures  . EKG 12-Lead      Glenetta Hew, M.D., M.S. Interventional Cardiologist   Pager # [phone] Phone # [phone] [address] [place], [place] [address]

## 2018-10-25 NOTE — Patient Instructions (Signed)
Medication Instructions:     CONTINUE TO FUROSEMIDE 40 MG  TWICE A DAY.  MAY   TAKE AN EXTRA 40 MG IN THE MORNING, IF  WEIGHT GAINS IS MORE THAN 3 LBS OVERNIGHT.   If you need a refill on your cardiac medications before your next appointment, please call your pharmacy.   Lab work: NOT NEEDED If you have labs (blood work) drawn today and your tests are completely normal, you will receive your results only by: [name] MyChart Message (if you have MyChart) OR . A paper copy in the mail If you have any lab test that is abnormal or we need to change your treatment, we will call you to review the results.  Testing/Procedures: NOT NEEDED  Follow-Up: At [facility], you and your health needs are our priority.  As part of our continuing mission to provide you with exceptional heart care, we have created designated Provider Care Teams.  These Care Teams include your primary Cardiologist (physician) and Advanced Practice Providers (APPs -  Physician Assistants and Nurse Practitioners) who all work together to provide you with the care you need, when you need it. . Your physician recommends that you schedule a follow-up appointment in [place] . Your physician recommends that you schedule a follow-up appointment in [place] .   Any Other Special Instructions Will Be Listed Below (If Applicable).  PURCHASE SOME SUPPORT STOCKING/.SOCKS

## 2018-10-27 ENCOUNTER — Encounter: Payer: Self-pay | Admitting: Cardiology

## 2018-10-27 DIAGNOSIS — I272 Pulmonary hypertension, unspecified: Secondary | ICD-10-CM | POA: Insufficient documentation

## 2018-10-27 NOTE — Assessment & Plan Note (Signed)
Chronic persistent peripheral edema -at least in some part related to venous stasis.  As long as her skin is not weeping, I think she can wear the support stockings.  Clearly when there is cellulitis, she should not. She is on a standing dose of Lasix with sliding scale.  We discussed weightbearing sliding scale. CONTINUE TO FUROSEMIDE 40 MG  TWICE A DAY.  MAY   TAKE AN EXTRA 40 MG IN THE MORNING, IF  WEIGHT GAINS IS MORE THAN 3 LBS OVERNIGHT

## 2018-10-27 NOTE — Assessment & Plan Note (Signed)
Remains asymptomatic.  Rate controlled on low-dose beta-blocker.  On Xarelto for anticoagulation.

## 2018-10-27 NOTE — Assessment & Plan Note (Signed)
LDL of 108 on last check.  Not currently on any treatment.  Defer to PCP.  Is pretty much close to goal for her age.

## 2018-10-27 NOTE — Assessment & Plan Note (Signed)
Well-controlled blood pressure on losartan and Toprol.

## 2018-10-27 NOTE — Assessment & Plan Note (Signed)
Elevated PA pressures estimated on echo.  This could be contributing to her edema.  Does not seem to be related to diastolic dysfunction/diastolic heart failure as she is not really having the PND and orthopnea associated with it. Continue sliding scale Lasix.  Is on beta-blocker and ARB as blessed blood pressure will tolerate. Is supposedly going to have sleep study evaluation for possible OSA.  This could certainly contribute.  Apparently was desaturating while in the hospital, it may be considered benefit of at least oxygen while sleeping.

## 2018-10-30 ENCOUNTER — Ambulatory Visit (INDEPENDENT_AMBULATORY_CARE_PROVIDER_SITE_OTHER): Payer: Medicare Other | Admitting: *Deleted

## 2018-10-30 ENCOUNTER — Encounter: Payer: Self-pay | Admitting: *Deleted

## 2018-10-30 VITALS — BP 102/53 | HR 96 | Temp 97.6°F | Ht 64.0 in | Wt 215.0 lb

## 2018-10-30 DIAGNOSIS — Z0000 Encounter for general adult medical examination without abnormal findings: Secondary | ICD-10-CM

## 2018-10-30 DIAGNOSIS — Z23 Encounter for immunization: Secondary | ICD-10-CM

## 2018-10-30 NOTE — Patient Instructions (Signed)
Please work on your goal of doing chair exercises at least 3 times per week.  At your convenience, please bring a copy of your Advance Directives (Healthcare Power of Attorney and Living Will) to our office to be filed in your medical record.  Please continue to move carefully to avoid falls.  You received your Shingrix (Shingles vaccine) today, you will need the 2nd dose after 12/31/2018.  Thank you for coming in for your Annual Wellness Visit today!!    Preventive Care 65 Years and Older, Female Preventive care refers to lifestyle choices and visits with your health care provider that can promote health and wellness. What does preventive care include?  A yearly physical exam. This is also called an annual well check.  Dental exams once or twice a year.  Routine eye exams. Ask your health care provider how often you should have your eyes checked.  Personal lifestyle choices, including: ? Daily care of your teeth and gums. ? Regular physical activity. ? Eating a healthy diet. ? Avoiding tobacco and drug use. ? Limiting alcohol use. ? Practicing safe sex. ? Taking low-dose aspirin every day. ? Taking vitamin and mineral supplements as recommended by your health care provider. What happens during an annual well check? The services and screenings done by your health care provider during your annual well check will depend on your age, overall health, lifestyle risk factors, and family history of disease. Counseling Your health care provider may ask you questions about your:  Alcohol use.  Tobacco use.  Drug use.  Emotional well-being.  Home and relationship well-being.  Sexual activity.  Eating habits.  History of falls.  Memory and ability to understand (cognition).  Work and work Statistician.  Reproductive health.  Screening You may have the following tests or measurements:  Height, weight, and BMI.  Blood pressure.  Lipid and cholesterol levels. These may  be checked every 5 years, or more frequently if you are over 80 years old.  Skin check.  Lung cancer screening. You may have this screening every year starting at age 35 if you have a 30-pack-year history of smoking and currently smoke or have quit within the past 15 years.  Fecal occult blood test (FOBT) of the stool. You may have this test every year starting at age 62.  Flexible sigmoidoscopy or colonoscopy. You may have a sigmoidoscopy every 5 years or a colonoscopy every 10 years starting at age 52.  Hepatitis C blood test.  Hepatitis B blood test.  Sexually transmitted disease (STD) testing.  Diabetes screening. This is done by checking your blood sugar (glucose) after you have not eaten for a while (fasting). You may have this done every 1-3 years.  Bone density scan. This is done to screen for osteoporosis. You may have this done starting at age 61.  Mammogram. This may be done every 1-2 years. Talk to your health care provider about how often you should have regular mammograms.  Talk with your health care provider about your test results, treatment options, and if necessary, the need for more tests. Vaccines Your health care provider may recommend certain vaccines, such as:  Influenza vaccine. This is recommended every year.  Tetanus, diphtheria, and acellular pertussis (Tdap, Td) vaccine. You may need a Td booster every 10 years.  Varicella vaccine. You may need this if you have not been vaccinated.  Zoster vaccine. You may need this after age 74.  Measles, mumps, and rubella (MMR) vaccine. You may need at least  one dose of MMR if you were born in 1957 or later. You may also need a second dose.  Pneumococcal 13-valent conjugate (PCV13) vaccine. One dose is recommended after age 100.  Pneumococcal polysaccharide (PPSV23) vaccine. One dose is recommended after age 75.  Meningococcal vaccine. You may need this if you have certain conditions.  Hepatitis A vaccine. You  may need this if you have certain conditions or if you travel or work in places where you may be exposed to hepatitis A.  Hepatitis B vaccine. You may need this if you have certain conditions or if you travel or work in places where you may be exposed to hepatitis B.  Haemophilus influenzae type b (Hib) vaccine. You may need this if you have certain conditions.  Talk to your health care provider about which screenings and vaccines you need and how often you need them. This information is not intended to replace advice given to you by your health care provider. Make sure you discuss any questions you have with your health care provider. Document Released: 12/12/2015 Document Revised: 08/04/2016 Document Reviewed: 09/16/2015 Elsevier Interactive Patient Education  2018 [employer].  8 Easy Exercises You Can Do Sitting Down  Got a chair? Then you're ready for this sit-down, total-body workout!.  Safety precaution: Pay attention to your body during the movements - if anything hurts or causes pain, stop immediately. And check with your doctor first before beginning this, or any, exercise program.   Sunshine Arm Circles Seated in a chair with good posture, hold a ball in both hands with arms extended above your head and/or in front of you, keeping elbows slightly bent. Visualizing the face of a clock out in front of you, begin by holding arms up overhead at 12 o'clock. Circle the ball around to go all the way around the clock in a controlled, fluid motion. When you've reached 12 o'clock again, reverse directions and circle the opposite way. Keep alternating circle directions for 8 repetitions. Rest. Do another set of 8 repetitions.  Modification: A ball is not required for this exercise. Imagine that you are holding a ball while performing the motion. If it is difficult to bring your arms overhead, extend them out in front of you and move arms as if drawing a circle on the wall with or  without the ball.   Tummy Twists Seated in a chair with good posture, hold a ball with both hands close to the body, with elbows bent and pulled in close to the ribcage. Slowly rotate your torso to the right as far as you comfortably can, being sure to keep the rest of your body still and stable. Rotate back to the center and repeat in the opposite direction. Do this 8 times, with two twists counting as a full set. Rest. Do another 8 sets (two twists each). Modification: A ball is not required for this exercise. Imagine you are holding a ball while performing the motion, or hold a small object such as a can of soup or water bottle to add resistance   Ball Chest Press Seated in a chair with good posture, hold a ball with both hands at chest level, palms facing toward each other and elbows bent. Avoid bending forward by keeping your shoulders back at all times. Squeeze the ball slightly as you push the ball away from you in a fluid motion, taking about 2 seconds to extend the arms. Squeeze your shoulder blades together as you pull the ball back  toward your chest. Repeat the push and pull motion 10 to 15 times. Rest. Do another set of 10 to 15 repetitions.  Modification: For a greater challenge, add a Tai Chi feel by standing with one leg slightly in front of the other (with a chair nearby if needed for extra balance) and slowly rocking the entire body forward and back as you push the ball away and pull back in.     Front Arm Raises In a seated position with good posture, hold a ball in both hands with palms facing each other. Extend the arms out in front of your body, keeping your elbows slightly bent. Starting with the ball lowered toward the knees, slowly raise your arms to lift the ball up to shoulder level (no higher), then lower the ball back to the starting position, taking about 2 to 3 seconds to lift and lower. Repeat 10 to 15 times. Rest. Do another set of 10 to 15  repetitions. Modification: A ball is not required for this exercise. Imagine you are holding a ball as you perform the motion, or hold a small object, such as a can of soup or water bottle for added resistance.        Inner Thigh Squeezes Sitting toward the edge of a chair with good posture and knees bent, place a ball in between your knees; press the knees together to squeeze the ball, taking about 1 to 2 seconds to squeeze. You should feel the resistance in your inner thighs. Slowly release, keeping slight tension on the ball so that it does not fall. Repeat 8 to 10 times. Rest. Do another set of 8 to 10 repetitions. Modification: For a greater challenge, change the count of the squeezes by squeezing the ball and holding for 5 seconds, then releasing again. Or, do short, quick pulsing squeezes.     Knee Extensions Sitting toward the edge of a chair with good posture and bent knees, hold on to the sides of the chair with your hands. Extend the right knee out so that the toes come up toward the ceiling, being sure to keep the knee slightly bent without locking it through the entire movement. Lower the leg back to a bent position and repeat this movement 8 to 10 times, using about 2 seconds each to lift and lower the leg. Switch to the opposite leg and perform 8 to 10 repetitions. Rest briefly. Do another set of 8 to 10 repetitions for each leg. Modification: If you are more advanced, sitting in the same position as above, extend one leg out in front of you with toes pointed to the ceiling. Lift and lower the entire leg only as high as you comfortably can, keeping the knee slightly bent. The longer lever adds difficulty to the exercise.   Elbow to Knee Seated toward the edge of a chair with good posture and knees bent, start with your right arm extended up overhead. Slowly lift the left knee up as you lower your right elbow down toward your left knee, taking about 2 seconds to  lower down. Try not to bend over at the waist. Release and go back to the starting position. Repeat 8 to 10 times. Switch sides and do 8 to 10 repetitions, pulling one elbow to the opposite knee. Rest. Do another set of 8 to 10 repetitions on each side. Modification: Try this (with a chair nearby for balance) exercise in a standing position for an increased range of motion.  Overhead Arm Extensions Seated in a chair with good posture, hold a ball with both hands and raise it up over your head, with arms extended without locking the elbows. Keeping the elbows pulled in toward the head, slowly bend the elbows to lower the ball down along the back of the neck, using about 2 seconds to go down, then 2 seconds to push the ball back up over your head. Repeat 8 to 10 times. Rest. Do another set of 8 to 10 repetitions. Modification: Try seated tricep extensions (ball not required for this modification). Bending slightly forward with elbows tucked into your sides, slowly extend the elbows so that your forearms go back behind you, keeping the elbows pulled up and in for the entire movement. Return to the starting position and repeat. Hold soup cans or small weights for added resistance.

## 2018-10-30 NOTE — Progress Notes (Addendum)
Subjective:   Debra Campbell is a 84 y.o. female who presents for Medicare Annual (Subsequent) preventive examination.  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  She enjoys reading, doing word search and jigsaw puzzles.  She lives with her husband, they have 3 children, 1 child is deceased, and 3 grandchildren.  She also has a cat.  Debra Campbell states she went to the ER and had one hospitalization in the past year due to sepsis with a fever of 103, and an acute CHF exacerbation.  She is currently doing physical therapy and occupational therapy with home health twice per week each.   These services were ordered when she was in the hospital.  She reports no surgeries in the past year,and feels her health is unchanged in general from last year.   Review of Systems:   All systems negative today  Cardiac Risk Factors include: advanced age (>78men, >13 women);diabetes mellitus;dyslipidemia;hypertension;obesity (BMI >30kg/m2);sedentary lifestyle     Objective:     Vitals: BP (!) 102/53   Pulse 96   Temp 97.6 F (36.4 C)   Ht 5\' 4"  (1.626 m)   Wt 215 lb (97.5 kg)   BMI 36.90 kg/m   Body mass index is 36.9 kg/m.  Advanced Directives 10/30/2018 10/09/2018 04/11/2018 10/26/2017 04/15/2017 01/24/2017 01/03/2017  Does Patient Have a Medical Advance Directive? Yes No Yes No Yes No No  Type of Paramedic of [place];Living will - [place];Living will - -  Does patient want to make changes to medical advance directive? No - Patient declined - - - - - -  Copy of [place] in Chart? No - copy requested - No - copy requested - No - copy requested - -  Would patient like information on creating a medical advance directive? - No - Patient declined - No - Patient declined - - -  Pre-existing out of facility DNR order (yellow form or pink MOST form) - - - - - - -    Tobacco Social  History   Tobacco Use  Smoking Status Never Smoker  Smokeless Tobacco Never Used     Counseling given: No   Clinical Intake:     Pain Score: 0-No pain                 Past Medical History:  Diagnosis Date  . Aortic calcification ([place]) 2013   Atherosclerotic calcifications of the abdominal aorta and branch noted on CT Abd/Pelvis  . Arthritis    legs, lower back, hands  . Cancer ([place])    endometrial  . Diabetes mellitus   . Ectopic pregnancy   . Eczema   . Headache(784.0)    hx of  . History of kidney stones   . Hyperlipidemia    no meds  . Hypertension   . Hypothyroidism   . Persistent atrial fibrillation 09/28/2016   Relatively new Dx: Asymptomatic.  Rate controlled without medication.  . SVD (spontaneous vaginal delivery)    x 3  . Thyroid disease    Past Surgical History:  Procedure Laterality Date  . ABDOMINAL HYSTERECTOMY Bilateral 06/26/2013   Procedure: TOTAL ABDOMINAL HYSTERECTOMY WITH BILATERAL SALPINGO OOPHERECTOMY WITH PELVIC LYMPHADNECTOMY;  Surgeon: Alvino Chapel, MD;  Location: [facility];  Service: Gynecology;  Laterality: Bilateral;  . BACK SURGERY  06/26/2013  . ECTOPIC PREGNANCY SURGERY    . HERNIA REPAIR    .  HYSTEROSCOPY W/D&C N/A 04/26/2013   Procedure: DILATATION AND CURETTAGE /HYSTEROSCOPY;  Surgeon: Woodroe Mode, MD;  Location: [facility];  Service: Gynecology;  Laterality: N/A;  . JOINT REPLACEMENT    . LAPAROTOMY     for ectopic pregnancy  . LAPAROTOMY N/A 06/26/2013   Procedure: EXPLORATORY LAPAROTOMY;  Surgeon: Alvino Chapel, MD;  Location: [facility];  Service: Gynecology;  Laterality: N/A;  . lt knee arthroscopy    . rt total hip replacement    . TONSILLECTOMY    . TOTAL KNEE ARTHROPLASTY Left 12/22/2016   Procedure: LEFT TOTAL KNEE ARTHROPLASTY;  Surgeon: Latanya Maudlin, MD;  Location: [facility];  Service: Orthopedics;  Laterality: Left;  . TRANSTHORACIC ECHOCARDIOGRAM  09/2018   EF 60-65%. Mild AS.  Mod-Severe PAH  60 mmHg  . WISDOM TOOTH EXTRACTION     Family History  Problem Relation Age of Onset  . Heart disease Father   . Thyroid disease Father   . Heart attack Father   . Heart disease Sister        open heart surgery  . Cancer Sister        breast  . Neuropathy Sister        secondary to cancer treatment  . Breast cancer Sister   . Suicidality Brother 53  . Arthritis Mother   . Cancer Daughter   . Breast cancer Daughter    Social History   Socioeconomic History  . Marital status: Married    Spouse name: Not on file  . Number of children: 3  . Years of education: Not on file  . Highest education level: High school graduate  Occupational History  . Occupation: Retired    Comment: [employer]  Social Needs  . Financial resource strain: Not hard at all  . Food insecurity:    Worry: Never true    Inability: Never true  . Transportation needs:    Medical: No    Non-medical: No  Tobacco Use  . Smoking status: Never Smoker  . Smokeless tobacco: Never Used  Substance and Sexual Activity  . Alcohol use: No  . Drug use: No  . Sexual activity: Not on file  Lifestyle  . Physical activity:    Days per week: 0 days    Minutes per session: 0 min  . Stress: Only a little  Relationships  . Social connections:    Talks on phone: More than three times a week    Gets together: More than three times a week    Attends religious service: Never    Active member of club or organization: No    Attends meetings of clubs or organizations: Never    Relationship status: Married  Other Topics Concern  . Not on file  Social History Narrative  . Not on file    Outpatient Encounter Medications as of 10/30/2018  Medication Sig  . acetaminophen (TYLENOL) 500 MG tablet Take 1 tablet (500 mg total) by mouth every 8 (eight) hours as needed.  . Blood Glucose Monitoring Suppl (ACCU-CHEK AVIVA) device Use as instructed  . furosemide (LASIX) 40 MG tablet Take 1 tablet (40 mg total) by mouth 2 (two)  times daily. MAY TAKE AN ADDITIONAL 40 MG IF WEIGHT GAIN OF 3 LBS OR MORE OVER NIGHT  . gabapentin (NEURONTIN) 300 MG capsule Take 1 capsule (300 mg total) by mouth 2 (two) times daily.  [name] glucose blood (ACCU-CHEK AVIVA PLUS) test strip Test 1x per day and prn. [id number].E11.9  .  levothyroxine (SYNTHROID, LEVOTHROID) 175 MCG tablet Take 1 tablet (175 mcg total) by mouth daily before breakfast.  . losartan (COZAAR) 25 MG tablet Take 0.5 tablets (12.5 mg total) by mouth daily.  . magnesium gluconate (MAGONATE) 30 MG tablet Take 1 tablet (30 mg total) by mouth daily.  . metFORMIN (GLUCOPHAGE) 1000 MG tablet TAKE 1 TABLET TWICE DAILY WITH A MEAL (Patient taking differently: Take 500 mg by mouth 2 (two) times daily with a meal. TAKE 1 TABLET TWICE DAILY WITH A MEAL)  . metoprolol succinate (TOPROL-XL) 25 MG 24 hr tablet Take 1 tablet (25 mg total) by mouth daily.  [name] nystatin cream (MYCOSTATIN) Apply 1 application topically 2 (two) times daily.  . potassium chloride (K-DUR) 10 MEQ tablet Take 1 tablet (10 mEq total) by mouth every Sunday.  . rivaroxaban (XARELTO) 20 MG TABS tablet TAKE 1 TABLET EVERY DAY WITH BREAKFAST  . triamcinolone cream (KENALOG) 0.5 % Apply 1 application topically 3 (three) times daily as needed.  . TRUEPLUS LANCETS 30G MISC CHECK BLOOD SUGAR ONCE A DAY AND AS NEEDED  . vitamin B-12 1000 MCG tablet Take 1 tablet (1,000 mcg total) by mouth daily.   No facility-administered encounter medications on file as of 10/30/2018.     Activities of Daily Living In your present state of health, do you have any difficulty performing the following activities: 10/30/2018 10/09/2018  Hearing? N Y  Vision? N N  Difficulty concentrating or making decisions? N Y  Comment - new today  Walking or climbing stairs? Y Y  Comment Uses walker or cane for ambulation -  Dressing or bathing? N N  Doing errands, shopping? Y N  Comment Has not been going on errands or doctor's visits alone since recent  hospitalization.  Still has driver's license.  -  Preparing Food and eating ? N -  In the past six months, have you accidently leaked urine? Y -  Comment Has incontience, wears incontinence under garments.  Not interested in seeing specialist  -  Do you have problems with loss of bowel control? N -  Managing your Medications? Y -  Comment Daughter in law is managing since patient was discharged from hospital -  Managing your Finances? N -  Housekeeping or managing your Housekeeping? Y -  Comment Family helps with house work -  Some recent data might be hidden    Patient Care Team: Chevis Pretty, FNP as PCP - General (Nurse Practitioner) Leonie Man, MD as PCP - Cardiology (Cardiology) Marti Sleigh, MD as Attending Physician (Gynecology) Latanya Maudlin, MD as Consulting Physician (Orthopedic Surgery) Leonie Man, MD as Consulting Physician (Cardiology)    Assessment:   This is a routine wellness examination for Debra Campbell.  Exercise Activities and Dietary recommendations   Patient usually eats 2-3 meals per day.  She has eggs and sausage or cereal for breakfast, sandwich for lunch, and meat and vegetables for supper.  When she only has 2 meals per day she states it is because she is not hungry for a 3rd meal.  Recommended a diet of mostly non starchy vegetables, lean proteins, and whole grain.  Recommended that patient try [place] sausage and limit this to 1-2 times per week.  Patient states she has resources and access to all the food she needs.   Current Exercise Habits: The patient does not participate in regular exercise at present, Exercise limited by: orthopedic condition(s)  Goals    . Exercise 3x per week (30 min per  time)     Do chair exercises given in patient instructions 3 times per week for 30 minutes each session.    [name] HEMOGLOBIN A1C < 7.0      Increase non-starchy vegetables - carrots, green bean, squash, zucchini, tomatoes, onions, peppers,  spinach and other green leafy vegetables, cabbage, lettuce, cucumbers, asparagus, okra (not fried), eggplant limit sugar and processed foods (cakes, cookies, ice cream, crackers and chips) Increase fresh fruit but limit serving sizes 1/2 cup or about the size of tennis or baseball limit red meat to no more than 1-2 times per week (serving size about the size of your palm) Choose whole grains / lean proteins - whole wheat bread, quinoa, whole grain rice (1/2 cup), fish, chicken, [place]     . Increase physical activity     Try to do chair exercises daily - refer to handout given today       Fall Risk Fall Risk  10/30/2018 10/30/2018 08/09/2018 07/25/2018 04/13/2018  Falls in the past year? (No Data) 1 No No No  Comment Patient had syncopal episode with loss of consicousess which caused her to fall - - - -  Number falls in past yr: - 0 - - -  Injury with Fall? - 0 - - -  Risk for fall due to : Impaired balance/gait Impaired mobility - - -   Is the patient's home free of loose throw rugs in walkways, pet beds, electrical cords, etc?   yes      Grab bars in the bathroom? yes      Handrails on the stairs?   yes      Adequate lighting?   yes    Depression Screen PHQ 2/9 Scores 10/30/2018 08/09/2018 07/25/2018 04/13/2018  PHQ - 2 Score 0 0 0 0     Cognitive Function MMSE - Mini Mental State Exam 10/30/2018 10/26/2017 06/06/2015  Orientation to time 5 5 5   Orientation to Place 5 5 5   Registration 3 3 3   Attention/ Calculation 5 5 5   Recall 3 3 3   Language- name 2 objects 2 2 2   Language- repeat 0 1 1  Language- follow 3 step command 3 3 3   Language- read & follow direction 1 1 1   Write a sentence 1 1 1   Copy design 1 1 1   Total score 29 30 30         Immunization History  Administered Date(s) Administered  . Influenza, High Dose Seasonal PF 09/28/2016, 10/06/2017, 10/17/2018  . Influenza,inj,Quad PF,6+ Mos 09/26/2013, 09/09/2014, 10/13/2015  . Pneumococcal Conjugate-13 05/01/2015  .  Pneumococcal Polysaccharide-23 03/29/2002  . Td 10/29/2008  . Zoster Recombinat (Shingrix) 10/30/2018    Qualifies for Shingles Vaccine?Yes, 1st dose given today  Screening Tests Health Maintenance  Topic Date Due  . TETANUS/TDAP  10/29/2018  . OPHTHALMOLOGY EXAM  10/31/2018  . HEMOGLOBIN A1C  04/10/2019  . FOOT EXAM  07/26/2019  . MAMMOGRAM  12/06/2019  . INFLUENZA VACCINE  Completed  . DEXA SCAN  Completed  . PNA vac Low Risk Adult  Completed    Cancer Screenings: Lung: Low Dose CT Chest recommended if Age 71-80 years, 30 pack-year currently smoking OR have quit w/in 15years. Patient does not qualify. Breast:  Up to date on Mammogram? Yes   Up to date of Bone Density/Dexa? Yes Colorectal: up to date  Additional Screenings:  Hepatitis C Screening:  Not indicated     Plan:       Work on your goal of  doing chair exercises at least 3 times per week. Bring a copy of your Advance Directives (Healthcare Power of Attorney and Living Will) to our office to be filed in your medical record. Continue to move carefully to avoid falls. You will need the 2nd Shingrix vaccine after 12/31/2018.  I have personally reviewed and noted the following in the patient's chart:   . Medical and social history . Use of alcohol, tobacco or illicit drugs  . Current medications and supplements . Functional ability and status . Nutritional status . Physical activity . Advanced directives . List of other physicians . Hospitalizations, surgeries, and ER visits in previous 12 months . Vitals . Screenings to include cognitive, depression, and falls . Referrals and appointments  In addition, I have reviewed and discussed with patient certain preventive protocols, quality metrics, and best practice recommendations. A written personalized care plan for preventive services as well as general preventive health recommendations were provided to patient.     WYATT, AMY M, RN  10/30/2018  I have  reviewed and agree with the above AWV documentation.   [name] Hassell Done, FNP

## 2018-10-31 ENCOUNTER — Encounter: Payer: Self-pay | Admitting: Nurse Practitioner

## 2018-10-31 ENCOUNTER — Ambulatory Visit (INDEPENDENT_AMBULATORY_CARE_PROVIDER_SITE_OTHER): Payer: Medicare Other | Admitting: Nurse Practitioner

## 2018-10-31 VITALS — BP 116/71 | HR 82 | Temp 97.1°F | Ht 64.0 in | Wt 212.0 lb

## 2018-10-31 DIAGNOSIS — E1142 Type 2 diabetes mellitus with diabetic polyneuropathy: Secondary | ICD-10-CM

## 2018-10-31 DIAGNOSIS — E034 Atrophy of thyroid (acquired): Secondary | ICD-10-CM | POA: Diagnosis not present

## 2018-10-31 DIAGNOSIS — I10 Essential (primary) hypertension: Secondary | ICD-10-CM

## 2018-10-31 DIAGNOSIS — R609 Edema, unspecified: Secondary | ICD-10-CM

## 2018-10-31 DIAGNOSIS — I4819 Other persistent atrial fibrillation: Secondary | ICD-10-CM

## 2018-10-31 DIAGNOSIS — E782 Mixed hyperlipidemia: Secondary | ICD-10-CM

## 2018-10-31 MED ORDER — GABAPENTIN 300 MG PO CAPS: 300.0000 mg | ORAL_CAPSULE | Freq: Two times a day (BID) | ORAL | 1 refills | Status: DC

## 2018-10-31 MED ORDER — POTASSIUM CHLORIDE ER 10 MEQ PO TBCR: 10.0000 meq | EXTENDED_RELEASE_TABLET | ORAL | 1 refills | Status: DC

## 2018-10-31 MED ORDER — METOPROLOL SUCCINATE ER 25 MG PO TB24: 25.0000 mg | ORAL_TABLET | Freq: Every day | ORAL | 1 refills | Status: DC

## 2018-10-31 MED ORDER — RIVAROXABAN 20 MG PO TABS: ORAL_TABLET | ORAL | 1 refills | Status: DC

## 2018-10-31 MED ORDER — LEVOTHYROXINE SODIUM 175 MCG PO TABS: 175.0000 ug | ORAL_TABLET | Freq: Every day | ORAL | 1 refills | Status: DC

## 2018-10-31 MED ORDER — LOSARTAN POTASSIUM 25 MG PO TABS: 12.5000 mg | ORAL_TABLET | Freq: Every day | ORAL | 1 refills | Status: DC

## 2018-10-31 MED ORDER — METFORMIN HCL 1000 MG PO TABS: 500.0000 mg | ORAL_TABLET | Freq: Two times a day (BID) | ORAL | 1 refills | Status: DC

## 2018-10-31 NOTE — Patient Instructions (Signed)

## 2018-10-31 NOTE — Progress Notes (Signed)
Subjective:    Patient ID: Debra Campbell, female    DOB: Feb 24, 1932, 84 y.o.   MRN: [id number]   Chief Complaint: Medical Management of Chronic Issues   HPI:  1. Essential hypertension  No c/o chest pain, sob or headache. Does not check blood pressure. BP Readings from Last 3 Encounters:  10/31/18 116/71  10/30/18 (!) 102/53  10/25/18 110/60     2. Persistent atrial fibrillation  Patient is on xeralto. No bleeding problems.  3. Diabetic polyneuropathy associated with type 2 diabetes mellitus (HCC) Has constant burning in feet.   4. Hypothyroidism due to acquired atrophy of thyroid  No problems that she is aware of.  5. Type 2 diabetes mellitus with diabetic polyneuropathy, without long-term current use of insulin ([place]) last hgba1c was  6.5%. She does not check blood sugars at home. No symptoms of hypoglycemia.  6. Mixed hyperlipidemia  Does not wtach diet and does no exercise  7. Morbid obesity ([place])  Weight down 7 lbs  8. Peripheral edema  Has daily but resolves at night when sleeping.    Outpatient Encounter Medications as of 10/31/2018  Medication Sig  . acetaminophen (TYLENOL) 500 MG tablet Take 1 tablet (500 mg total) by mouth every 8 (eight) hours as needed.  . Blood Glucose Monitoring Suppl (ACCU-CHEK AVIVA) device Use as instructed  . furosemide (LASIX) 40 MG tablet Take 1 tablet (40 mg total) by mouth 2 (two) times daily. MAY TAKE AN ADDITIONAL 40 MG IF WEIGHT GAIN OF 3 LBS OR MORE OVER NIGHT  . gabapentin (NEURONTIN) 300 MG capsule Take 1 capsule (300 mg total) by mouth 2 (two) times daily.  [name] glucose blood (ACCU-CHEK AVIVA PLUS) test strip Test 1x per day and prn. DX.E11.9  . levothyroxine (SYNTHROID, LEVOTHROID) 175 MCG tablet Take 1 tablet (175 mcg total) by mouth daily before breakfast.  . losartan (COZAAR) 25 MG tablet Take 0.5 tablets (12.5 mg total) by mouth daily.  . magnesium gluconate (MAGONATE) 30 MG tablet Take 1 tablet (30 mg total) by mouth daily.    . metFORMIN (GLUCOPHAGE) 1000 MG tablet TAKE 1 TABLET TWICE DAILY WITH A MEAL (Patient taking differently: Take 500 mg by mouth 2 (two) times daily with a meal. TAKE 1 TABLET TWICE DAILY WITH A MEAL)  . metoprolol succinate (TOPROL-XL) 25 MG 24 hr tablet Take 1 tablet (25 mg total) by mouth daily.  [name] nystatin cream (MYCOSTATIN) Apply 1 application topically 2 (two) times daily.  . potassium chloride (K-DUR) 10 MEQ tablet Take 1 tablet (10 mEq total) by mouth every Sunday.  . rivaroxaban (XARELTO) 20 MG TABS tablet TAKE 1 TABLET EVERY DAY WITH BREAKFAST  . triamcinolone cream (KENALOG) 0.5 % Apply 1 application topically 3 (three) times daily as needed.  . TRUEPLUS LANCETS 30G MISC CHECK BLOOD SUGAR ONCE A DAY AND AS NEEDED  . vitamin B-12 1000 MCG tablet Take 1 tablet (1,000 mcg total) by mouth daily.      New complaints: None today  Social history: Lives alone. Has family check on her daily.   Review of Systems  Constitutional: Negative for activity change and appetite change.  HENT: Negative.   Eyes: Negative for pain.  Respiratory: Negative for shortness of breath.   Cardiovascular: Negative for chest pain, palpitations and leg swelling.  Gastrointestinal: Negative for abdominal pain.  Endocrine: Negative for polydipsia.  Genitourinary: Negative.   Skin: Negative for rash.  Neurological: Negative for dizziness, weakness and headaches.  Hematological: Does not bruise/bleed  easily.  Psychiatric/Behavioral: Negative.   All other systems reviewed and are negative.      Objective:   Physical Exam  Constitutional: She is oriented to person, place, and time. She appears well-developed and well-nourished. No distress.  HENT:  Head: Normocephalic.  Nose: Nose normal.  Mouth/Throat: Oropharynx is clear and moist.  Eyes: Pupils are equal, round, and reactive to light. EOM are normal.  Neck: Normal range of motion. Neck supple. No JVD present. Carotid bruit is not present.   Cardiovascular: Normal rate, regular rhythm and intact distal pulses.  Murmur (1/6 systolic murmur) heard. Pulmonary/Chest: Effort normal and breath sounds normal. No respiratory distress. She has no wheezes. She has no rales. She exhibits no tenderness.  Abdominal: Soft. Normal appearance, normal aorta and bowel sounds are normal. She exhibits no distension, no abdominal bruit, no pulsatile midline mass and no mass. There is no splenomegaly or hepatomegaly. There is no tenderness.  Musculoskeletal: Normal range of motion. She exhibits edema (1+ bil lower ext).  Lymphadenopathy:    She has no cervical adenopathy.  Neurological: She is alert and oriented to person, place, and time. She has normal reflexes.  Skin: Skin is warm and dry.  Psychiatric: She has a normal mood and affect. Her behavior is normal. Judgment and thought content normal.  Nursing note and vitals reviewed.  BP 116/71   Pulse 82   Temp (!) 97.1 F (36.2 C) (Oral)   Ht 5\' 4"  (1.626 m)   Wt 212 lb (96.2 kg)   BMI 36.39 kg/m       Assessment & Plan:  Debra Campbell comes in today with chief complaint of Medical Management of Chronic Issues   Diagnosis and orders addressed:  1. Essential hypertension Low sodium diet - metoprolol succinate (TOPROL-XL) 25 MG 24 hr tablet; Take 1 tablet (25 mg total) by mouth daily.  Dispense: 90 tablet; Refill: 1  2. Persistent atrial fibrillation - rivaroxaban (XARELTO) 20 MG TABS tablet; TAKE 1 TABLET EVERY DAY WITH BREAKFAST  Dispense: 90 tablet; Refill: 1  3. Diabetic polyneuropathy associated with type 2 diabetes mellitus ([place]) Continue to watch carbs in diet - gabapentin (NEURONTIN) 300 MG capsule; Take 1 capsule (300 mg total) by mouth 2 (two) times daily.  Dispense: 180 capsule; Refill: 1  4. Hypothyroidism due to acquired atrophy of thyroid - levothyroxine (SYNTHROID, LEVOTHROID) 175 MCG tablet; Take 1 tablet (175 mcg total) by mouth daily before breakfast.  Dispense:  90 tablet; Refill: 1  5. Type 2 diabetes mellitus with diabetic polyneuropathy, without long-term current use of insulin (HCC) Continue to watch carbs in diet - metFORMIN (GLUCOPHAGE) 1000 MG tablet; Take 0.5 tablets (500 mg total) by mouth 2 (two) times daily with a meal. TAKE 1 TABLET TWICE DAILY WITH A MEAL  Dispense: 180 tablet; Refill: 1  6. Mixed hyperlipidemia Low fat diet  7. Morbid obesity ([place]) Discussed diet and exercise for person with BMI >25 Will recheck weight in 3-6 months  8. Peripheral edema Elevate legs when sitting   Labs pending Health Maintenance reviewed Diet and exercise encouraged  Follow up plan: 3 months   [name] Hassell Done, FNP

## 2018-11-01 ENCOUNTER — Telehealth: Payer: Self-pay | Admitting: *Deleted

## 2018-11-01 DIAGNOSIS — I5033 Acute on chronic diastolic (congestive) heart failure: Secondary | ICD-10-CM | POA: Diagnosis not present

## 2018-11-01 DIAGNOSIS — E1142 Type 2 diabetes mellitus with diabetic polyneuropathy: Secondary | ICD-10-CM

## 2018-11-01 DIAGNOSIS — I130 Hypertensive heart and chronic kidney disease with heart failure and stage 1 through stage 4 chronic kidney disease, or unspecified chronic kidney disease: Secondary | ICD-10-CM | POA: Diagnosis not present

## 2018-11-01 DIAGNOSIS — E1122 Type 2 diabetes mellitus with diabetic chronic kidney disease: Secondary | ICD-10-CM | POA: Diagnosis not present

## 2018-11-01 DIAGNOSIS — J9601 Acute respiratory failure with hypoxia: Secondary | ICD-10-CM | POA: Diagnosis not present

## 2018-11-01 DIAGNOSIS — N182 Chronic kidney disease, stage 2 (mild): Secondary | ICD-10-CM | POA: Diagnosis not present

## 2018-11-01 DIAGNOSIS — A419 Sepsis, unspecified organism: Secondary | ICD-10-CM | POA: Diagnosis not present

## 2018-11-01 MED ORDER — METFORMIN HCL 1000 MG PO TABS: 1000.0000 mg | ORAL_TABLET | Freq: Two times a day (BID) | ORAL | 1 refills | Status: DC

## 2018-11-01 NOTE — Telephone Encounter (Signed)
Fax from [facility] 10/31/18 refill on Metformin had 2 sets of directions New Rx sent with correct directions

## 2018-11-02 DIAGNOSIS — J9601 Acute respiratory failure with hypoxia: Secondary | ICD-10-CM | POA: Diagnosis not present

## 2018-11-02 DIAGNOSIS — I5033 Acute on chronic diastolic (congestive) heart failure: Secondary | ICD-10-CM | POA: Diagnosis not present

## 2018-11-02 DIAGNOSIS — A419 Sepsis, unspecified organism: Secondary | ICD-10-CM | POA: Diagnosis not present

## 2018-11-02 DIAGNOSIS — I130 Hypertensive heart and chronic kidney disease with heart failure and stage 1 through stage 4 chronic kidney disease, or unspecified chronic kidney disease: Secondary | ICD-10-CM | POA: Diagnosis not present

## 2018-11-02 DIAGNOSIS — N182 Chronic kidney disease, stage 2 (mild): Secondary | ICD-10-CM | POA: Diagnosis not present

## 2018-11-02 DIAGNOSIS — E1122 Type 2 diabetes mellitus with diabetic chronic kidney disease: Secondary | ICD-10-CM | POA: Diagnosis not present

## 2018-11-06 DIAGNOSIS — I5033 Acute on chronic diastolic (congestive) heart failure: Secondary | ICD-10-CM | POA: Diagnosis not present

## 2018-11-06 DIAGNOSIS — E1122 Type 2 diabetes mellitus with diabetic chronic kidney disease: Secondary | ICD-10-CM | POA: Diagnosis not present

## 2018-11-06 DIAGNOSIS — N182 Chronic kidney disease, stage 2 (mild): Secondary | ICD-10-CM | POA: Diagnosis not present

## 2018-11-06 DIAGNOSIS — A419 Sepsis, unspecified organism: Secondary | ICD-10-CM | POA: Diagnosis not present

## 2018-11-06 DIAGNOSIS — J9601 Acute respiratory failure with hypoxia: Secondary | ICD-10-CM | POA: Diagnosis not present

## 2018-11-06 DIAGNOSIS — I130 Hypertensive heart and chronic kidney disease with heart failure and stage 1 through stage 4 chronic kidney disease, or unspecified chronic kidney disease: Secondary | ICD-10-CM | POA: Diagnosis not present

## 2018-11-14 DIAGNOSIS — I5033 Acute on chronic diastolic (congestive) heart failure: Secondary | ICD-10-CM | POA: Diagnosis not present

## 2018-11-14 DIAGNOSIS — J9601 Acute respiratory failure with hypoxia: Secondary | ICD-10-CM | POA: Diagnosis not present

## 2018-11-14 DIAGNOSIS — I130 Hypertensive heart and chronic kidney disease with heart failure and stage 1 through stage 4 chronic kidney disease, or unspecified chronic kidney disease: Secondary | ICD-10-CM | POA: Diagnosis not present

## 2018-11-14 DIAGNOSIS — E1122 Type 2 diabetes mellitus with diabetic chronic kidney disease: Secondary | ICD-10-CM | POA: Diagnosis not present

## 2018-11-14 DIAGNOSIS — A419 Sepsis, unspecified organism: Secondary | ICD-10-CM | POA: Diagnosis not present

## 2018-11-14 DIAGNOSIS — N182 Chronic kidney disease, stage 2 (mild): Secondary | ICD-10-CM | POA: Diagnosis not present

## 2018-11-24 ENCOUNTER — Telehealth: Payer: Self-pay | Admitting: Nurse Practitioner

## 2018-11-24 ENCOUNTER — Telehealth: Payer: Self-pay | Admitting: Cardiology

## 2018-11-24 NOTE — Telephone Encounter (Signed)
Pt aware and will call us Monday to let us know if she has decided to stop taking it and if she needs labs ordered.

## 2018-11-24 NOTE — Telephone Encounter (Signed)
magnesium should not be causing dizziness. Why does she think tha tis causing it?

## 2018-11-24 NOTE — Telephone Encounter (Signed)
New Message   Pt c/o medication issue:  1. Name of Medication: rivaroxaban (XARELTO) 20 MG TABS tablet  2. How are you currently taking this medication (dosage and times per day)? TAKE 1 TABLET EVERY DAY WITH BREAKFAST  3. Are you having a reaction (difficulty breathing--STAT)? no  4. What is your medication issue? Pt states the medication is too costly and want to know what options she has on lowering the cost. Please call

## 2018-11-24 NOTE — Telephone Encounter (Signed)
Pt states the dizziness and weakness started right after she started on the Magnesium.

## 2018-11-24 NOTE — Telephone Encounter (Signed)
Spoke with pt, she is not able to afford the $400 for the xarelto. They have tried calling [facility] but have not been able to find anyone that can help them. Number to [facility] given to the patient, she is going to call and see if she would qualify for their program. If so she will let us know so she can change to eliquis. She has enough xarelto to last for another month.

## 2018-11-24 NOTE — Telephone Encounter (Signed)
Pt states she is on Magnesium and has been swimmy headed, weakness and diarrhea. Should she continue the Magnesium or does she need to stop taking it. It looks like when she was in the hospital 11/14 her magnesium was low and they started her on it?

## 2018-11-24 NOTE — Telephone Encounter (Signed)
Well she can stop it and see if get sbteer but will need to recheck labs next week if quits

## 2018-12-01 ENCOUNTER — Other Ambulatory Visit: Payer: Medicare Other

## 2018-12-01 DIAGNOSIS — R42 Dizziness and giddiness: Secondary | ICD-10-CM | POA: Diagnosis not present

## 2018-12-02 LAB — MAGNESIUM: Magnesium: 1.5 mg/dL — ABNORMAL LOW (ref 1.6–2.3)

## 2018-12-04 ENCOUNTER — Other Ambulatory Visit: Payer: Self-pay | Admitting: Nurse Practitioner

## 2018-12-04 MED ORDER — MAGNESIUM 30 MG PO TABS: 30.0000 mg | ORAL_TABLET | Freq: Two times a day (BID) | ORAL | 3 refills | Status: DC

## 2018-12-05 NOTE — Telephone Encounter (Signed)
Spoke to patient. She states that Willette Cluster is out of town and will be back next week.  RN informed patient to please have lynette  to call - regards to cost of medication for 2020

## 2018-12-11 NOTE — Telephone Encounter (Signed)
Returned call to daughter in [employer].   Patient was in [place] end of 2019. They were trying to get the cost of xarelto cheaper.   Patient has not had to purchase xarelto so far this year - she ordered a 3 month supply on 11/28/18.   Advised her will have Ivin Booty RN follow up with her

## 2018-12-11 NOTE — Telephone Encounter (Signed)
Follow up    Patients daughter Willette Cluster returning call in reference to medication.

## 2018-12-15 NOTE — Telephone Encounter (Signed)
Spoke with Homer. She states patient obtained a 3 month supply prior to the end of the year. Will not need  Medication until @march .  RN suggest daughter in law to contact insurance to see what medication where in lower tier formulary - daughter in law  Verbalized understanding.

## 2019-01-19 ENCOUNTER — Ambulatory Visit (INDEPENDENT_AMBULATORY_CARE_PROVIDER_SITE_OTHER): Payer: Medicare Other | Admitting: Cardiology

## 2019-01-19 ENCOUNTER — Encounter: Payer: Self-pay | Admitting: Cardiology

## 2019-01-19 VITALS — BP 126/66 | HR 93 | Ht 64.0 in | Wt 217.4 lb

## 2019-01-19 DIAGNOSIS — E782 Mixed hyperlipidemia: Secondary | ICD-10-CM

## 2019-01-19 DIAGNOSIS — R609 Edema, unspecified: Secondary | ICD-10-CM | POA: Diagnosis not present

## 2019-01-19 DIAGNOSIS — I272 Pulmonary hypertension, unspecified: Secondary | ICD-10-CM

## 2019-01-19 DIAGNOSIS — I4819 Other persistent atrial fibrillation: Secondary | ICD-10-CM

## 2019-01-19 DIAGNOSIS — I10 Essential (primary) hypertension: Secondary | ICD-10-CM

## 2019-01-19 DIAGNOSIS — E1142 Type 2 diabetes mellitus with diabetic polyneuropathy: Secondary | ICD-10-CM | POA: Diagnosis not present

## 2019-01-19 NOTE — Assessment & Plan Note (Signed)
Continue twice daily with furosemide 40mg  Continue to recommend elevating her feet and wearing the zipper support stockings.

## 2019-01-19 NOTE — Progress Notes (Signed)
PCP: Chevis Pretty, FNP  Clinic Note: Chief Complaint  Patient presents with  . Follow-up    still has swelling,   . Atrial Fibrillation    No recurrent symptoms    HPI: Debra Campbell is a 85 y.o. female with a PMH notable for PAF who presents today for 3 month follow-up.  CHA2DS2-VASc Score and unadjusted Ischemic Stroke Rate (% per year) is equal to 7.2 % stroke rate/year from a score of 5 (HTN, DM, Female, Age (2)).  -- on [place] was last seen in Nov 2019 -- post-Hospital.  Still noted edema - was on Lasix. Recommended continue Lasix & try support stockings.   Recent Hospitalizations:   11/11-15/19: UTI sepsis.  Planned OP OSA sleep study.. Noted progressive edema & wgt gain.  (chronically sleeps in recliner) -- put on Lasix 40 mg BID.  [place] her head.  --No new visit since that  Studies Personally Reviewed - (if available, images/films reviewed: From Epic Chart or Care Everywhere)  None since last visit  Interval History: Debra Campbell presents today pretty much back to her baseline now.  She still has no signs or symptoms of atrial fibrillation.  She does not really complain of exertional dyspnea, but does not walk much.  She uses a walker to walk.  She sleeps in a recliner because of arthritis pains with her hip and leg but does light back quite a bit she does have chronic edema for which she takes her twice daily Lasix.  This is not gotten any better or worse.  Is pretty stable.  She did purchase the zipper support stockings, but has not yet started wearing them, because she thought they looked small.  She denies any syncope or near syncope.  No falls like she had last November.  No chest pain or pressure with rest or exertion.  No TIA or amaurosis fugax. No melena, hematochezia, hematuria epistaxis.  No significant bleeding or bruising. Does not walk enough to no claudication.   ROS: A comprehensive was performed. Review of Systems    Constitutional: Positive for weight loss (Has not been eating as much). Negative for malaise/fatigue.  HENT: Negative for congestion and nosebleeds.   Respiratory: Negative for cough, shortness of breath and wheezing.   Cardiovascular: Positive for leg swelling (Chronic).  Gastrointestinal: Positive for heartburn. Negative for abdominal pain, blood in stool, melena, nausea and vomiting.  Genitourinary: Negative for frequency and hematuria.  Musculoskeletal: Positive for back pain and joint pain (Bilateral shoulder, right hip and left knee).  Neurological: Positive for dizziness (Rarely when she first stands up).  Psychiatric/Behavioral: Positive for memory loss (She seems to be having some, but does not admit to it). Negative for depression. The patient is not nervous/anxious and does not have insomnia.   All other systems reviewed and are negative.  I have reviewed and (if needed) personally updated the patient's problem list, medications, allergies, past medical and surgical history, social and family history.   Past Medical History:  Diagnosis Date  . Aortic calcification ([place]) 2013   Atherosclerotic calcifications of the abdominal aorta and branch noted on CT Abd/Pelvis  . Arthritis    legs, lower back, hands  . Cancer ([place])    endometrial  . Diabetes mellitus   . Ectopic pregnancy   . Eczema   . Headache(784.0)    hx of  . History of kidney stones   . Hyperlipidemia    no meds  .  Hypertension   . Hypothyroidism   . Persistent atrial fibrillation 09/28/2016   Relatively new Dx: Asymptomatic.  Rate controlled without medication.  . SVD (spontaneous vaginal delivery)    x 3  . Thyroid disease     Past Surgical History:  Procedure Laterality Date  . ABDOMINAL HYSTERECTOMY Bilateral 06/26/2013   Procedure: TOTAL ABDOMINAL HYSTERECTOMY WITH BILATERAL SALPINGO OOPHERECTOMY WITH PELVIC LYMPHADNECTOMY;  Surgeon: Alvino Chapel, MD;  Location: [facility];  Service:  Gynecology;  Laterality: Bilateral;  . BACK SURGERY  06/26/2013  . ECTOPIC PREGNANCY SURGERY    . HERNIA REPAIR    . HYSTEROSCOPY W/D&C N/A 04/26/2013   Procedure: DILATATION AND CURETTAGE /HYSTEROSCOPY;  Surgeon: Woodroe Mode, MD;  Location: [facility];  Service: Gynecology;  Laterality: N/A;  . JOINT REPLACEMENT    . LAPAROTOMY     for ectopic pregnancy  . LAPAROTOMY N/A 06/26/2013   Procedure: EXPLORATORY LAPAROTOMY;  Surgeon: Alvino Chapel, MD;  Location: [facility];  Service: Gynecology;  Laterality: N/A;  . lt knee arthroscopy    . rt total hip replacement    . TONSILLECTOMY    . TOTAL KNEE ARTHROPLASTY Left 12/22/2016   Procedure: LEFT TOTAL KNEE ARTHROPLASTY;  Surgeon: Latanya Maudlin, MD;  Location: [facility];  Service: Orthopedics;  Laterality: Left;  . TRANSTHORACIC ECHOCARDIOGRAM  09/2018   EF 60-65%. Mild AS.  Mod-Severe PAH 60 mmHg  . WISDOM TOOTH EXTRACTION     Echo 10/12/18: EF 60-65%. Mild AS.  Mod-Severe PAH 60 mmHg  Current Meds  Medication Sig  . acetaminophen (TYLENOL) 500 MG tablet Take 1 tablet (500 mg total) by mouth every 8 (eight) hours as needed.  . Blood Glucose Monitoring Suppl (ACCU-CHEK AVIVA) device Use as instructed  . furosemide (LASIX) 40 MG tablet Take 1 tablet (40 mg total) by mouth 2 (two) times daily. MAY TAKE AN ADDITIONAL 40 MG IF WEIGHT GAIN OF 3 LBS OR MORE OVER NIGHT  . gabapentin (NEURONTIN) 300 MG capsule Take 1 capsule (300 mg total) by mouth 2 (two) times daily.  [name] glucose blood (ACCU-CHEK AVIVA PLUS) test strip Test 1x per day and prn. [id number].E11.9  . levothyroxine (SYNTHROID, LEVOTHROID) 175 MCG tablet Take 1 tablet (175 mcg total) by mouth daily before breakfast.  . magnesium 30 MG tablet Take 1 tablet (30 mg total) by mouth 2 (two) times daily.  . magnesium gluconate (MAGONATE) 30 MG tablet Take 1 tablet (30 mg total) by mouth daily.  . metFORMIN (GLUCOPHAGE) 1000 MG tablet Take 1 tablet (1,000 mg total) by mouth 2 (two) times daily with a  meal.  . metoprolol succinate (TOPROL-XL) 25 MG 24 hr tablet Take 1 tablet (25 mg total) by mouth daily.  [name] nystatin cream (MYCOSTATIN) Apply 1 application topically 2 (two) times daily.  . potassium chloride (K-DUR) 10 MEQ tablet Take 1 tablet (10 mEq total) by mouth every Sunday.  . rivaroxaban (XARELTO) 20 MG TABS tablet TAKE 1 TABLET EVERY DAY WITH BREAKFAST  . triamcinolone cream (KENALOG) 0.5 % Apply 1 application topically 3 (three) times daily as needed.  . TRUEPLUS LANCETS 30G MISC CHECK BLOOD SUGAR ONCE A DAY AND AS NEEDED  . vitamin B-12 1000 MCG tablet Take 1 tablet (1,000 mcg total) by mouth daily.  . [DISCONTINUED] losartan (COZAAR) 25 MG tablet Take 0.5 tablets (12.5 mg total) by mouth daily.    Allergies  Allergen Reactions  . Ace Inhibitors Cough  . Iohexol      Desc: HIVES 40  YEARS AGO   . Statins     myalgia  . Penicillins Swelling and Rash    Has patient had a PCN reaction causing immediate rash, facial/tongue/throat swelling, SOB or lightheadedness with hypotension: Yes Has patient had a PCN reaction causing severe rash involving mucus membranes or skin necrosis: No Has patient had a PCN reaction that required hospitalization Yes Has patient had a PCN reaction occurring within the last 10 years: No If all of the above answers are "NO", then may proceed with Cephalosporin use.   . Sulfa Antibiotics Rash    Social History   Tobacco Use  . Smoking status: Never Smoker  . Smokeless tobacco: Never Used  Substance Use Topics  . Alcohol use: No  . Drug use: No   Social History   Social History Narrative  . Not on file    Family History family history includes Arthritis in her mother; Breast cancer in her daughter and sister; Cancer in her daughter and sister; Heart attack in her father; Heart disease in her father and sister; Neuropathy in her sister; Suicidality (age of onset: 67) in her brother; Thyroid disease in her father.  Wt Readings from Last 3  Encounters:  01/19/19 217 lb 6.4 oz (98.6 kg)  10/31/18 212 lb (96.2 kg)  10/30/18 215 lb (97.5 kg)  10/26/2019 220 lb 3.2 oz  PHYSICAL EXAM BP 126/66   Pulse 93   Ht 5\' 4"  (1.626 m)   Wt 217 lb 6.4 oz (98.6 kg)   BMI 37.32 kg/m  Physical Exam  Constitutional: She is oriented to person, place, and time. She appears well-developed. No distress.  Obese.  Well-groomed  HENT:  Head: Normocephalic and atraumatic.  Hard of hearing  Neck: Normal range of motion. No hepatojugular reflux and no JVD present. Carotid bruit is not present. No tracheal deviation present. No thyromegaly present.  Cardiovascular: Normal rate, normal heart sounds and normal pulses. An irregularly irregular rhythm present. PMI is not displaced. Exam reveals no gallop.  No murmur heard. Pulmonary/Chest: Effort normal and breath sounds normal. No respiratory distress. She has no wheezes. She has no rales.  Abdominal: Soft. Bowel sounds are normal. She exhibits no distension. There is no abdominal tenderness. There is no rebound.  Obese  Musculoskeletal:        General: Edema (2+ bilateral lower extremity with mild stasis changes) present.     Comments: Stable antalgic gait favoring left knee and right hip.  Somewhat stiff with decreased range of motion.  Neurological: She is alert and oriented to person, place, and time.  Skin:  Lower extremity venous stasis changes.  Psychiatric: She has a normal mood and affect. Her behavior is normal. Thought content normal.  Still feisty, but more adherent  Nursing note and vitals reviewed.    Adult ECG Report  Rate: 93;  Rhythm: atrial fibrillation, premature ventricular contractions (PVC) and Nonspecific ST-T wave changes.  Cannot exclude inferior ischemia.  More related to conduction delay.  Otherwise normal axis, intervals and durations. Nonspecific ST and T-wave changes.;   Narrative Interpretation: Stable EKG   Other studies Reviewed: Additional studies/ records that  were reviewed today include:  Recent Labs:   Lab Results  Component Value Date   CREATININE 1.02 (H) 10/17/2018   BUN 12 10/17/2018   NA 140 10/17/2018   K 4.0 10/17/2018   CL 96 10/17/2018   CO2 24 10/17/2018   Lab Results  Component Value Date   CHOL 190 07/25/2018  HDL 53 07/25/2018   LDLCALC 108 (H) 07/25/2018   TRIG 147 07/25/2018   CHOLHDL 3.6 07/25/2018    ASSESSMENT / PLAN: Problem List Items Addressed This Visit    Diabetic neuropathy ([place])    Probably contributing somewhat to venous stasis.      Essential hypertension (Chronic)    Well-controlled on current meds.  Having some dizziness is somewhat orthostatic, will DC dose of losartan this will still allow [name] to have adequate control with beta-blocker.      Relevant Orders   EKG 12-Lead   Hyperlipidemia (Chronic)    Relatively well controlled labs.  Defer to PCP.      Peripheral edema (Chronic)    Continue twice daily with furosemide 40mg  Continue to recommend elevating her feet and wearing the zipper support stockings.      Persistent atrial fibrillation - Primary (Chronic)    Asymptomatic.  Rate controlled on low-dose beta-blocker.  On Xarelto for anticoagulation with no bleeding issues. She is concerned about the possibility of the donut hole and may be looking for other ways to try to get that Xarelto within the year.  This can be discussed when she is seen in 7-month follow-up with Jory Sims, DNP      Relevant Orders   EKG 12-Lead   Pulmonary hypertension, unspecified ([place])    Elevated PA pressures on could be contributing to edema.  I do not think is related to diastolic heart failure as she does not have any PND orthopnea. She is on a beta-blocker could convert to calcium channel blocker since she is doing quite well, would probably not change anything at this point.         Over 25 minutes was spent with the patient and her niece today. >r 75% of the time was spent in direct patient  counseling. Discussing pathophysiology of atrial fibrillation as well as concern for stroke prevention.  She was much more receptive and understanding of our thought process.  Much better grasp of the reasons for taking the current medications.   Current medicines are reviewed at length with the patient today. (+/- concerns)  - concerns about taking a new medication The following changes have been made:None  Patient Instructions  .Medication Instructions:    STOP LOSARTAN  If you need a refill on your cardiac medications before your next appointment, please call your pharmacy.   Lab work:  NOT NEEDED  If you have labs (blood work) drawn today and your tests are completely normal, you will receive your results only by: [name] MyChart Message (if you have MyChart) OR . A paper copy in the mail If you have any lab test that is abnormal or we need to change your treatment, we will call you to review the results.  Testing/Procedures:  NOT NEEDED  Follow-Up: At [facility], you and your health needs are our priority.  As part of our continuing mission to provide you with exceptional heart care, we have created designated Provider Care Teams.  These Care Teams include your primary Cardiologist (physician) and Advanced Practice Providers (APPs -  Physician Assistants and Nurse Practitioners) who all work together to provide you with the care you need, when you need it. You will need a follow up appointment in 12 months.  Please call our office 2 months in advance to schedule this appointment.  You may see Glenetta Hew, MD or one of the following Advanced Practice Providers on your designated Care Team:   Rosaria Ferries,  PA-C . Jory Sims, DNP, ANP-Your physician recommends that you schedule a follow-up appointment in 6 MONTHS. [name]   Any Other Special Instructions Will Be Listed Below (If Applicable).  WHEN YOU SIT IN YOUR RECLINER - KEEP YOUR LEGS ELEVATED, AND WEAR YOUR SUPPORT HOSE  OR SOCKS    Studies Ordered:   Orders Placed This Encounter  Procedures  . EKG 12-Lead      Glenetta Hew, M.D., M.S. Interventional Cardiologist   Pager # [phone] Phone # [phone] [address]. [place], [place] [address]

## 2019-01-19 NOTE — Assessment & Plan Note (Signed)
Asymptomatic.  Rate controlled on low-dose beta-blocker.  On Xarelto for anticoagulation with no bleeding issues. She is concerned about the possibility of the donut hole and may be looking for other ways to try to get that Xarelto within the year.  This can be discussed when she is seen in 50-month follow-up with Jory Sims, DNP

## 2019-01-19 NOTE — Assessment & Plan Note (Signed)
Elevated PA pressures on could be contributing to edema.  I do not think is related to diastolic heart failure as she does not have any PND orthopnea. She is on a beta-blocker could convert to calcium channel blocker since she is doing quite well, would probably not change anything at this point.

## 2019-01-19 NOTE — Assessment & Plan Note (Signed)
Probably contributing somewhat to venous stasis.

## 2019-01-19 NOTE — Assessment & Plan Note (Addendum)
Relatively well controlled labs.  Defer to PCP.

## 2019-01-19 NOTE — Assessment & Plan Note (Signed)
Well-controlled on current meds.  Having some dizziness is somewhat orthostatic, will DC dose of losartan this will still allow [name] to have adequate control with beta-blocker.

## 2019-01-19 NOTE — Patient Instructions (Signed)
.  Medication Instructions:    STOP LOSARTAN  If you need a refill on your cardiac medications before your next appointment, please call your pharmacy.   Lab work:  NOT NEEDED  If you have labs (blood work) drawn today and your tests are completely normal, you will receive your results only by: [name] MyChart Message (if you have MyChart) OR . A paper copy in the mail If you have any lab test that is abnormal or we need to change your treatment, we will call you to review the results.  Testing/Procedures:  NOT NEEDED  Follow-Up: At [facility], you and your health needs are our priority.  As part of our continuing mission to provide you with exceptional heart care, we have created designated Provider Care Teams.  These Care Teams include your primary Cardiologist (physician) and Advanced Practice Providers (APPs -  Physician Assistants and Nurse Practitioners) who all work together to provide you with the care you need, when you need it. You will need a follow up appointment in 12 months.  Please call our office 2 months in advance to schedule this appointment.  You may see Glenetta Hew, MD or one of the following Advanced Practice Providers on your designated Care Team:   Rosaria Ferries, PA-C . Jory Sims, DNP, ANP-Your physician recommends that you schedule a follow-up appointment in 6 MONTHS. [name]   Any Other Special Instructions Will Be Listed Below (If Applicable).  WHEN YOU SIT IN YOUR RECLINER - KEEP YOUR LEGS ELEVATED, AND WEAR YOUR SUPPORT HOSE OR SOCKS

## 2019-01-20 ENCOUNTER — Encounter: Payer: Self-pay | Admitting: Cardiology

## 2019-02-01 ENCOUNTER — Encounter: Payer: Self-pay | Admitting: Nurse Practitioner

## 2019-02-01 ENCOUNTER — Ambulatory Visit (INDEPENDENT_AMBULATORY_CARE_PROVIDER_SITE_OTHER): Payer: Medicare Other | Admitting: Nurse Practitioner

## 2019-02-01 VITALS — BP 118/73 | HR 87 | Temp 96.9°F | Ht 64.0 in | Wt 206.0 lb

## 2019-02-01 DIAGNOSIS — E876 Hypokalemia: Secondary | ICD-10-CM

## 2019-02-01 DIAGNOSIS — I272 Pulmonary hypertension, unspecified: Secondary | ICD-10-CM

## 2019-02-01 DIAGNOSIS — I4819 Other persistent atrial fibrillation: Secondary | ICD-10-CM

## 2019-02-01 DIAGNOSIS — I10 Essential (primary) hypertension: Secondary | ICD-10-CM | POA: Diagnosis not present

## 2019-02-01 DIAGNOSIS — E782 Mixed hyperlipidemia: Secondary | ICD-10-CM

## 2019-02-01 DIAGNOSIS — E034 Atrophy of thyroid (acquired): Secondary | ICD-10-CM | POA: Diagnosis not present

## 2019-02-01 DIAGNOSIS — R609 Edema, unspecified: Secondary | ICD-10-CM | POA: Diagnosis not present

## 2019-02-01 DIAGNOSIS — E1142 Type 2 diabetes mellitus with diabetic polyneuropathy: Secondary | ICD-10-CM

## 2019-02-01 LAB — BAYER DCA HB A1C WAIVED: HB A1C: 7.8 % — AB (ref <7.0)

## 2019-02-01 MED ORDER — GABAPENTIN 300 MG PO CAPS: 300.0000 mg | ORAL_CAPSULE | Freq: Two times a day (BID) | ORAL | 1 refills | Status: DC

## 2019-02-01 MED ORDER — METOPROLOL SUCCINATE ER 25 MG PO TB24: 25.0000 mg | ORAL_TABLET | Freq: Every day | ORAL | 1 refills | Status: DC

## 2019-02-01 MED ORDER — METFORMIN HCL 1000 MG PO TABS: 1000.0000 mg | ORAL_TABLET | Freq: Two times a day (BID) | ORAL | 1 refills | Status: DC

## 2019-02-01 MED ORDER — POTASSIUM CHLORIDE ER 10 MEQ PO TBCR: 10.0000 meq | EXTENDED_RELEASE_TABLET | ORAL | 1 refills | Status: DC

## 2019-02-01 MED ORDER — LEVOTHYROXINE SODIUM 175 MCG PO TABS: 175.0000 ug | ORAL_TABLET | Freq: Every day | ORAL | 1 refills | Status: DC

## 2019-02-01 MED ORDER — RIVAROXABAN 20 MG PO TABS: ORAL_TABLET | ORAL | 1 refills | Status: DC

## 2019-02-01 MED ORDER — FUROSEMIDE 40 MG PO TABS: 40.0000 mg | ORAL_TABLET | Freq: Two times a day (BID) | ORAL | 3 refills | Status: DC

## 2019-02-01 MED ORDER — MAGNESIUM GLUCONATE 30 MG PO TABS: 30.0000 mg | ORAL_TABLET | Freq: Every day | ORAL | 0 refills | Status: DC

## 2019-02-01 NOTE — Progress Notes (Signed)
Subjective:    Patient ID: Debra Campbell, female    DOB: 26-Jun-1932, 85 y.o.   MRN: [id number]   Chief Complaint: Medical Management of Chronic Issues   HPI:  1. Essential hypertension  No c/o chest pain, sob or headache. Does not check blood pressure at home. BP Readings from Last 3 Encounters:  02/01/19 118/73  01/19/19 126/66  10/31/18 116/71     2. Pulmonary hypertension, unspecified ([place])  denies any sob  3. Type 2 diabetes mellitus with diabetic polyneuropathy, without long-term current use of insulin (HCC) hgba1c was 6.7%. she doe snot check blood sugars at home.  4. Diabetic polyneuropathy associated with type 2 diabetes mellitus (HCC) Has constant numbness and tingling in bil feet   5. Mixed hyperlipidemia  Does not watch diet and is not able of dong exercise.  6. Hypothyroidism due to acquired atrophy of thyroid  No problems that she is aware of  7. Persistent atrial fibrillation  Is on xeralto and denies any bleeding  8. Peripheral edema  Has daily edema  9. Morbid obesity (Debra Campbell)  No recent weight changes    Outpatient Encounter Medications as of 02/01/2019  Medication Sig  . acetaminophen (TYLENOL) 500 MG tablet Take 1 tablet (500 mg total) by mouth every 8 (eight) hours as needed.  . Blood Glucose Monitoring Suppl (ACCU-CHEK AVIVA) device Use as instructed  . gabapentin (NEURONTIN) 300 MG capsule Take 1 capsule (300 mg total) by mouth 2 (two) times daily.  [name] glucose blood (ACCU-CHEK AVIVA PLUS) test strip Test 1x per day and prn. DX.E11.9  . levothyroxine (SYNTHROID, LEVOTHROID) 175 MCG tablet Take 1 tablet (175 mcg total) by mouth daily before breakfast.  . magnesium 30 MG tablet Take 1 tablet (30 mg total) by mouth 2 (two) times daily.  . magnesium gluconate (MAGONATE) 30 MG tablet Take 1 tablet (30 mg total) by mouth daily.  . metFORMIN (GLUCOPHAGE) 1000 MG tablet Take 1 tablet (1,000 mg total) by mouth 2 (two) times daily with a meal.  . metoprolol  succinate (TOPROL-XL) 25 MG 24 hr tablet Take 1 tablet (25 mg total) by mouth daily.  [name] nystatin cream (MYCOSTATIN) Apply 1 application topically 2 (two) times daily.  . potassium chloride (K-DUR) 10 MEQ tablet Take 1 tablet (10 mEq total) by mouth every Sunday.  . rivaroxaban (XARELTO) 20 MG TABS tablet TAKE 1 TABLET EVERY DAY WITH BREAKFAST  . triamcinolone cream (KENALOG) 0.5 % Apply 1 application topically 3 (three) times daily as needed.  . TRUEPLUS LANCETS 30G MISC CHECK BLOOD SUGAR ONCE A DAY AND AS NEEDED  . vitamin B-12 1000 MCG tablet Take 1 tablet (1,000 mcg total) by mouth daily.  . furosemide (LASIX) 40 MG tablet Take 1 tablet (40 mg total) by mouth 2 (two) times daily. MAY TAKE AN ADDITIONAL 40 MG IF WEIGHT GAIN OF 3 LBS OR MORE OVER NIGHT       New complaints: None today  Social history: Is having to use walker to get around now.   Review of Systems  Constitutional: Negative for activity change and appetite change.  HENT: Negative.   Eyes: Negative for pain.  Respiratory: Negative for shortness of breath.   Cardiovascular: Negative for chest pain, palpitations and leg swelling.  Gastrointestinal: Negative for abdominal pain.  Endocrine: Negative for polydipsia.  Genitourinary: Negative.   Skin: Negative for rash.  Neurological: Negative for dizziness, weakness and headaches.  Hematological: Does not bruise/bleed easily.  Psychiatric/Behavioral: Negative.   All  other systems reviewed and are negative.      Objective:   Physical Exam Vitals signs and nursing note reviewed.  Constitutional:      General: She is not in acute distress.    Appearance: Normal appearance. She is well-developed.  HENT:     Head: Normocephalic.     Nose: Nose normal.  Eyes:     Pupils: Pupils are equal, round, and reactive to light.  Neck:     Musculoskeletal: Normal range of motion and neck supple.     Vascular: No carotid bruit or JVD.  Cardiovascular:     Rate and Rhythm:  Normal rate. Rhythm irregular.     Heart sounds: Normal heart sounds.  Pulmonary:     Effort: Pulmonary effort is normal. No respiratory distress.     Breath sounds: Normal breath sounds. No wheezing or rales.  Chest:     Chest wall: No tenderness.  Abdominal:     General: Bowel sounds are normal. There is no distension or abdominal bruit.     Palpations: Abdomen is soft. There is no hepatomegaly, splenomegaly, mass or pulsatile mass.     Tenderness: There is no abdominal tenderness.  Musculoskeletal: Normal range of motion.     Right lower leg: Edema (2+) present.     Left lower leg: Edema (2+) present.  Lymphadenopathy:     Cervical: No cervical adenopathy.  Skin:    General: Skin is warm and dry.  Neurological:     Mental Status: She is alert and oriented to person, place, and time.     Deep Tendon Reflexes: Reflexes are normal and symmetric.  Psychiatric:        Behavior: Behavior normal.        Thought Content: Thought content normal.        Judgment: Judgment normal.    BP 118/73   Pulse 87   Temp (!) 96.9 F (36.1 C) (Oral)   Ht 5' 4"  (1.626 m)   Wt 206 lb (93.4 kg)   BMI 35.36 kg/m  hgba1c 7.8%    Assessment & Plan:  Debra Campbell comes in today with chief complaint of Medical Management of Chronic Issues   Diagnosis and orders addressed:  1. Essential hypertension Low sodium diet - CMP14+EGFR - metoprolol succinate (TOPROL-XL) 25 MG 24 hr tablet; Take 1 tablet (25 mg total) by mouth daily.  Dispense: 90 tablet; Refill: 1 - magnesium gluconate (MAGONATE) 30 MG tablet; Take 1 tablet (30 mg total) by mouth daily.  Dispense: 30 tablet; Refill: 0  2. Pulmonary hypertension, unspecified ([place])  3. Type 2 diabetes mellitus with diabetic polyneuropathy, without long-term current use of insulin (HCC) Carb counting - Bayer DCA Hb A1c Waived - Microalbumin / creatinine urine ratio - metFORMIN (GLUCOPHAGE) 1000 MG tablet; Take 1 tablet (1,000 mg total) by mouth 2  (two) times daily with a meal.  Dispense: 180 tablet; Refill: 1  4. Diabetic polyneuropathy associated with type 2 diabetes mellitus ([place]) Do not go barefooted Check feet daily - gabapentin (NEURONTIN) 300 MG capsule; Take 1 capsule (300 mg total) by mouth 2 (two) times daily.  Dispense: 180 capsule; Refill: 1  5. Mixed hyperlipidemia Low fat diet - Lipid panel  6. Hypothyroidism due to acquired atrophy of thyroid - levothyroxine (SYNTHROID, LEVOTHROID) 175 MCG tablet; Take 1 tablet (175 mcg total) by mouth daily before breakfast.  Dispense: 90 tablet; Refill: 1 - Thyroid Panel With TSH  7. Persistent atrial fibrillation - rivaroxaban (XARELTO)  20 MG TABS tablet; TAKE 1 TABLET EVERY DAY WITH BREAKFAST  Dispense: 90 tablet; Refill: 1  8. Peripheral edema Elevate legs when sitting - furosemide (LASIX) 40 MG tablet; Take 1 tablet (40 mg total) by mouth 2 (two) times daily. MAY TAKE AN ADDITIONAL 40 MG IF WEIGHT GAIN OF 3 LBS OR MORE OVER NIGHT  Dispense: 225 tablet; Refill: 3  9. Morbid obesity ([place]) Discussed diet and exercise for person with BMI >25 Will recheck weight in 3-6 months  10. Hypokalemia - potassium chloride (K-DUR) 10 MEQ tablet; Take 1 tablet (10 mEq total) by mouth every Sunday.  Dispense: 90 tablet; Refill: 1   Labs pending Health Maintenance reviewed Diet and exercise encouraged  Follow up plan: 3 months   [name] Hassell Done, FNP

## 2019-02-01 NOTE — Patient Instructions (Signed)
Diabetes Mellitus and Foot Care  Foot care is an important part of your health, especially when you have diabetes. Diabetes may cause you to have problems because of poor blood flow (circulation) to your feet and legs, which can cause your skin to:   Become thinner and drier.   Break more easily.   Heal more slowly.   Peel and crack.  You may also have nerve damage (neuropathy) in your legs and feet, causing decreased feeling in them. This means that you may not notice minor injuries to your feet that could lead to more serious problems. Noticing and addressing any potential problems early is the best way to prevent future foot problems.  How to care for your feet  Foot hygiene   Wash your feet daily with warm water and mild soap. Do not use hot water. Then, pat your feet and the areas between your toes until they are completely dry. Do not soak your feet as this can dry your skin.   Trim your toenails straight across. Do not dig under them or around the cuticle. File the edges of your nails with an emery board or nail file.   Apply a moisturizing lotion or petroleum jelly to the skin on your feet and to dry, brittle toenails. Use lotion that does not contain alcohol and is unscented. Do not apply lotion between your toes.  Shoes and socks   Wear clean socks or stockings every day. Make sure they are not too tight. Do not wear knee-high stockings since they may decrease blood flow to your legs.   Wear shoes that fit properly and have enough cushioning. Always look in your shoes before you put them on to be sure there are no objects inside.   To break in new shoes, wear them for just a few hours a day. This prevents injuries on your feet.  Wounds, scrapes, corns, and calluses   Check your feet daily for blisters, cuts, bruises, sores, and redness. If you cannot see the bottom of your feet, use a mirror or ask someone for help.   Do not cut corns or calluses or try to remove them with medicine.   If you  find a minor scrape, cut, or break in the skin on your feet, keep it and the skin around it clean and dry. You may clean these areas with mild soap and water. Do not clean the area with peroxide, alcohol, or iodine.   If you have a wound, scrape, corn, or callus on your foot, look at it several times a day to make sure it is healing and not infected. Check for:  ? Redness, swelling, or pain.  ? Fluid or blood.  ? Warmth.  ? Pus or a bad smell.  General instructions   Do not cross your legs. This may decrease blood flow to your feet.   Do not use heating pads or hot water bottles on your feet. They may burn your skin. If you have lost feeling in your feet or legs, you may not know this is happening until it is too late.   Protect your feet from hot and cold by wearing shoes, such as at the beach or on hot pavement.   Schedule a complete foot exam at least once a year (annually) or more often if you have foot problems. If you have foot problems, report any cuts, sores, or bruises to your health care provider immediately.  Contact a health care provider if:     You have a medical condition that increases your risk of infection and you have any cuts, sores, or bruises on your feet.   You have an injury that is not healing.   You have redness on your legs or feet.   You feel burning or tingling in your legs or feet.   You have pain or cramps in your legs and feet.   Your legs or feet are numb.   Your feet always feel cold.   You have pain around a toenail.  Get help right away if:   You have a wound, scrape, corn, or callus on your foot and:  ? You have pain, swelling, or redness that gets worse.  ? You have fluid or blood coming from the wound, scrape, corn, or callus.  ? Your wound, scrape, corn, or callus feels warm to the touch.  ? You have pus or a bad smell coming from the wound, scrape, corn, or callus.  ? You have a fever.  ? You have a red line going up your leg.  Summary   Check your feet every day  for cuts, sores, red spots, swelling, and blisters.   Moisturize feet and legs daily.   Wear shoes that fit properly and have enough cushioning.   If you have foot problems, report any cuts, sores, or bruises to your health care provider immediately.   Schedule a complete foot exam at least once a year (annually) or more often if you have foot problems.  This information is not intended to replace advice given to you by your health care provider. Make sure you discuss any questions you have with your health care provider.  Document Released: 11/12/2000 Document Revised: 12/28/2017 Document Reviewed: 12/17/2016  Elsevier Interactive Patient Education  2019 [employer]

## 2019-02-02 LAB — CMP14+EGFR
A/G RATIO: 1.6 (ref 1.2–2.2)
ALK PHOS: 64 IU/L (ref 39–117)
ALT: 14 IU/L (ref 0–32)
AST: 20 IU/L (ref 0–40)
Albumin: 4.1 g/dL (ref 3.6–4.6)
BILIRUBIN TOTAL: 0.8 mg/dL (ref 0.0–1.2)
BUN / CREAT RATIO: 12 (ref 12–28)
BUN: 13 mg/dL (ref 8–27)
CHLORIDE: 98 mmol/L (ref 96–106)
CO2: 27 mmol/L (ref 20–29)
Calcium: 9.6 mg/dL (ref 8.7–10.3)
Creatinine, Ser: 1.11 mg/dL — ABNORMAL HIGH (ref 0.57–1.00)
GFR calc Af Amer: 53 mL/min/{1.73_m2} — ABNORMAL LOW (ref >59)
GFR calc non Af Amer: 46 mL/min/{1.73_m2} — ABNORMAL LOW (ref >59)
GLUCOSE: 212 mg/dL — AB (ref 65–99)
Globulin, Total: 2.6 g/dL (ref 1.5–4.5)
POTASSIUM: 4.4 mmol/L (ref 3.5–5.2)
Sodium: 141 mmol/L (ref 134–144)
Total Protein: 6.7 g/dL (ref 6.0–8.5)

## 2019-02-02 LAB — LIPID PANEL
CHOLESTEROL TOTAL: 208 mg/dL — AB (ref 100–199)
Chol/HDL Ratio: 4.2 ratio (ref 0.0–4.4)
HDL: 50 mg/dL (ref >39)
LDL Calculated: 128 mg/dL — ABNORMAL HIGH (ref 0–99)
Triglycerides: 149 mg/dL (ref 0–149)
VLDL CHOLESTEROL CAL: 30 mg/dL (ref 5–40)

## 2019-02-02 LAB — THYROID PANEL WITH TSH
Free Thyroxine Index: 2.8 (ref 1.2–4.9)
T3 Uptake Ratio: 31 % (ref 24–39)
T4, Total: 9 ug/dL (ref 4.5–12.0)
TSH: 0.882 u[IU]/mL (ref 0.450–4.500)

## 2019-02-26 ENCOUNTER — Other Ambulatory Visit: Payer: Self-pay | Admitting: Nurse Practitioner

## 2019-03-28 ENCOUNTER — Other Ambulatory Visit: Payer: Self-pay | Admitting: Nurse Practitioner

## 2019-03-28 DIAGNOSIS — I4819 Other persistent atrial fibrillation: Secondary | ICD-10-CM

## 2019-05-11 ENCOUNTER — Telehealth: Payer: Self-pay | Admitting: Cardiology

## 2019-05-11 NOTE — Telephone Encounter (Signed)
Encounter not needed

## 2019-05-22 ENCOUNTER — Other Ambulatory Visit: Payer: Self-pay

## 2019-05-24 ENCOUNTER — Encounter: Payer: Self-pay | Admitting: Nurse Practitioner

## 2019-05-24 ENCOUNTER — Ambulatory Visit (INDEPENDENT_AMBULATORY_CARE_PROVIDER_SITE_OTHER): Payer: Medicare Other | Admitting: Nurse Practitioner

## 2019-05-24 ENCOUNTER — Other Ambulatory Visit: Payer: Self-pay

## 2019-05-24 DIAGNOSIS — E876 Hypokalemia: Secondary | ICD-10-CM | POA: Diagnosis not present

## 2019-05-24 DIAGNOSIS — I10 Essential (primary) hypertension: Secondary | ICD-10-CM | POA: Diagnosis not present

## 2019-05-24 DIAGNOSIS — E782 Mixed hyperlipidemia: Secondary | ICD-10-CM | POA: Diagnosis not present

## 2019-05-24 DIAGNOSIS — E034 Atrophy of thyroid (acquired): Secondary | ICD-10-CM

## 2019-05-24 DIAGNOSIS — E1142 Type 2 diabetes mellitus with diabetic polyneuropathy: Secondary | ICD-10-CM

## 2019-05-24 DIAGNOSIS — I4819 Other persistent atrial fibrillation: Secondary | ICD-10-CM

## 2019-05-24 DIAGNOSIS — R609 Edema, unspecified: Secondary | ICD-10-CM | POA: Diagnosis not present

## 2019-05-24 DIAGNOSIS — R600 Localized edema: Secondary | ICD-10-CM

## 2019-05-24 MED ORDER — RIVAROXABAN 20 MG PO TABS: ORAL_TABLET | ORAL | 1 refills | Status: DC

## 2019-05-24 MED ORDER — LEVOTHYROXINE SODIUM 175 MCG PO TABS: 175.0000 ug | ORAL_TABLET | Freq: Every day | ORAL | 1 refills | Status: DC

## 2019-05-24 MED ORDER — METOPROLOL SUCCINATE ER 25 MG PO TB24: 25.0000 mg | ORAL_TABLET | Freq: Every day | ORAL | 1 refills | Status: DC

## 2019-05-24 MED ORDER — FUROSEMIDE 40 MG PO TABS: 40.0000 mg | ORAL_TABLET | Freq: Two times a day (BID) | ORAL | 3 refills | Status: DC

## 2019-05-24 MED ORDER — MAGNESIUM GLUCONATE 30 MG PO TABS: 30.0000 mg | ORAL_TABLET | Freq: Every day | ORAL | 0 refills | Status: DC

## 2019-05-24 MED ORDER — POTASSIUM CHLORIDE ER 10 MEQ PO TBCR: 10.0000 meq | EXTENDED_RELEASE_TABLET | ORAL | 1 refills | Status: DC

## 2019-05-24 MED ORDER — GABAPENTIN 300 MG PO CAPS: 300.0000 mg | ORAL_CAPSULE | Freq: Two times a day (BID) | ORAL | 1 refills | Status: DC

## 2019-05-24 MED ORDER — METFORMIN HCL 1000 MG PO TABS: 1000.0000 mg | ORAL_TABLET | Freq: Two times a day (BID) | ORAL | 1 refills | Status: DC

## 2019-05-24 NOTE — Progress Notes (Signed)
Virtual Visit via telephone Note  I connected with Debra Campbell on 05/24/19 at 9:55 by telephone and verified that I am speaking with the correct person using two identifiers. Debra Campbell is currently located at home and no one is currently with her during visit. The provider, [name] Hassell Done, FNP is located in their office at time of visit.  I discussed the limitations, risks, security and privacy concerns of performing an evaluation and management service by telephone and the availability of in person appointments. I also discussed with the patient that there may be a patient responsible charge related to this service. The patient expressed understanding and agreed to proceed.   History and Present Illness:   Chief Complaint: Medical Management of Chronic Issues    HPI:  1. Essential hypertension No c/o chest pain, sob or headache. Does not check blood pressure at home. BP Readings from Last 3 Encounters:  02/01/19 118/73  01/19/19 126/66  10/31/18 116/71     2. Hypothyroidism due to acquired atrophy of thyroid No problems that she is aware of  3. Type 2 diabetes mellitus with diabetic polyneuropathy, without long-term current use of insulin (HCC) Last hgab1c was 7.8%. she has not been checking blood sugars at home. She says her meter is broke. She tries to watch her diet.  4. Diabetic polyneuropathy associated with type 2 diabetes mellitus ([place]) She says her feet burn all the time. The neurontin helps  5. Mixed hyperlipidemia Does not watch diet and does no exercise  6. Peripheral edema Resolves at night. Is usually swollen by evening time.  7. Morbid obesity ([place]) No weight changes  8. Persistent atrial fibrillation denies any palpitations.    Outpatient Encounter Medications as of 05/24/2019  Medication Sig  . acetaminophen (TYLENOL) 500 MG tablet Take 1 tablet (500 mg total) by mouth every 8 (eight) hours as needed.  . Blood Glucose Monitoring  Suppl (ACCU-CHEK AVIVA) device Use as instructed  . furosemide (LASIX) 40 MG tablet Take 1 tablet (40 mg total) by mouth 2 (two) times daily. MAY TAKE AN ADDITIONAL 40 MG IF WEIGHT GAIN OF 3 LBS OR MORE OVER NIGHT  . gabapentin (NEURONTIN) 300 MG capsule Take 1 capsule (300 mg total) by mouth 2 (two) times daily.  [name] glucose blood (ACCU-CHEK AVIVA PLUS) test strip Test 1x per day and prn. [id number].E11.9  . levothyroxine (SYNTHROID, LEVOTHROID) 175 MCG tablet Take 1 tablet (175 mcg total) by mouth daily before breakfast.  . magnesium gluconate (MAGONATE) 30 MG tablet Take 1 tablet (30 mg total) by mouth daily.  . metFORMIN (GLUCOPHAGE) 1000 MG tablet Take 1 tablet (1,000 mg total) by mouth 2 (two) times daily with a meal.  . metoprolol succinate (TOPROL-XL) 25 MG 24 hr tablet Take 1 tablet (25 mg total) by mouth daily.  [name] nystatin cream (MYCOSTATIN) Apply 1 application topically 2 (two) times daily.  . potassium chloride (K-DUR) 10 MEQ tablet Take 1 tablet (10 mEq total) by mouth every Sunday.  . triamcinolone cream (KENALOG) 0.5 % APPLY 1 APPLICATION TOPICALLY THREE TIMES DAILY AS NEEDED.  [name] TRUEPLUS LANCETS 30G MISC CHECK BLOOD SUGAR ONCE A DAY AND AS NEEDED  . vitamin B-12 1000 MCG tablet Take 1 tablet (1,000 mcg total) by mouth daily.  Alveda Reasons 20 MG TABS tablet TAKE 1 TABLET EVERY DAY WITH BREAKFAST    Past Surgical History:  Procedure Laterality Date  . ABDOMINAL HYSTERECTOMY Bilateral 06/26/2013   Procedure: TOTAL ABDOMINAL HYSTERECTOMY WITH BILATERAL SALPINGO  OOPHERECTOMY WITH PELVIC LYMPHADNECTOMY;  Surgeon: Alvino Chapel, MD;  Location: [facility];  Service: Gynecology;  Laterality: Bilateral;  . BACK SURGERY  06/26/2013  . ECTOPIC PREGNANCY SURGERY    . HERNIA REPAIR    . HYSTEROSCOPY W/D&C N/A 04/26/2013   Procedure: DILATATION AND CURETTAGE /HYSTEROSCOPY;  Surgeon: Woodroe Mode, MD;  Location: [facility];  Service: Gynecology;  Laterality: N/A;  . JOINT REPLACEMENT    . LAPAROTOMY      for ectopic pregnancy  . LAPAROTOMY N/A 06/26/2013   Procedure: EXPLORATORY LAPAROTOMY;  Surgeon: Alvino Chapel, MD;  Location: [facility];  Service: Gynecology;  Laterality: N/A;  . lt knee arthroscopy    . rt total hip replacement    . TONSILLECTOMY    . TOTAL KNEE ARTHROPLASTY Left 12/22/2016   Procedure: LEFT TOTAL KNEE ARTHROPLASTY;  Surgeon: Latanya Maudlin, MD;  Location: [facility];  Service: Orthopedics;  Laterality: Left;  . TRANSTHORACIC ECHOCARDIOGRAM  09/2018   EF 60-65%. Mild AS.  Mod-Severe PAH 60 mmHg  . WISDOM TOOTH EXTRACTION      Family History  Problem Relation Age of Onset  . Heart disease Father   . Thyroid disease Father   . Heart attack Father   . Heart disease Sister        open heart surgery  . Cancer Sister        breast  . Neuropathy Sister        secondary to cancer treatment  . Breast cancer Sister   . Suicidality Brother 32  . Arthritis Mother   . Cancer Daughter   . Breast cancer Daughter     New complaints: None today  Social history: Lives by herself     Review of Systems  Constitutional: Negative for diaphoresis and weight loss.  Eyes: Negative for blurred vision, double vision and pain.  Respiratory: Negative for shortness of breath.   Cardiovascular: Negative for chest pain, palpitations, orthopnea and leg swelling.  Gastrointestinal: Negative for abdominal pain.  Skin: Negative for rash.  Neurological: Negative for dizziness, sensory change, loss of consciousness, weakness and headaches.  Endo/Heme/Allergies: Negative for polydipsia. Does not bruise/bleed easily.  Psychiatric/Behavioral: Negative for memory loss. The patient does not have insomnia.   All other systems reviewed and are negative.    Observations/Objective: Alert and oriented- answers all questions appropriately No distress   Assessment and Plan: PAMLEA FINDER comes in today with chief complaint of Medical Management of Chronic Issues   Diagnosis  and orders addressed:  1. Essential hypertension Low sodium diet - magnesium gluconate (MAGONATE) 30 MG tablet; Take 1 tablet (30 mg total) by mouth daily.  Dispense: 30 tablet; Refill: 0 - metoprolol succinate (TOPROL-XL) 25 MG 24 hr tablet; Take 1 tablet (25 mg total) by mouth daily.  Dispense: 90 tablet; Refill: 1  2. Hypothyroidism due to acquired atrophy of thyroid - levothyroxine (SYNTHROID) 175 MCG tablet; Take 1 tablet (175 mcg total) by mouth daily before breakfast.  Dispense: 90 tablet; Refill: 1  3. Type 2 diabetes mellitus with diabetic polyneuropathy, without long-term current use of insulin (HCC) Strict carb counting Will try to get meter fixed and if cant will call and we will order another one for her. - metFORMIN (GLUCOPHAGE) 1000 MG tablet; Take 1 tablet (1,000 mg total) by mouth 2 (two) times daily with a meal.  Dispense: 180 tablet; Refill: 1  4. Diabetic polyneuropathy associated with type 2 diabetes mellitus ([place]) Do not go barefooted - gabapentin (  NEURONTIN) 300 MG capsule; Take 1 capsule (300 mg total) by mouth 2 (two) times daily.  Dispense: 180 capsule; Refill: 1  5. Mixed hyperlipidemia Low fat diet  6. Peripheral edema Elevate legs when sitting - furosemide (LASIX) 40 MG tablet; Take 1 tablet (40 mg total) by mouth 2 (two) times daily. MAY TAKE AN ADDITIONAL 40 MG IF WEIGHT GAIN OF 3 LBS OR MORE OVER NIGHT  Dispense: 225 tablet; Refill: 3  7. Morbid obesity ([place]) Discussed diet and exercise for person with BMI >25 Will recheck weight in 3-6 months  8. Persistent atrial fibrillation Avoid caffeine - rivaroxaban (XARELTO) 20 MG TABS tablet; TAKE 1 TABLET EVERY DAY WITH BREAKFAST  Dispense: 90 tablet; Refill: 1  9. Hypokalemia - potassium chloride (K-DUR) 10 MEQ tablet; Take 1 tablet (10 mEq total) by mouth every Sunday.  Dispense: 90 tablet; Refill: 1   Previous labs reviewed Health Maintenance reviewed Diet and exercise encouraged  Follow up plan:  3 months     I discussed the assessment and treatment plan with the patient. The patient was provided an opportunity to ask questions and all were answered. The patient agreed with the plan and demonstrated an understanding of the instructions.   The patient was advised to call back or seek an in-person evaluation if the symptoms worsen or if the condition fails to improve as anticipated.  The above assessment and management plan was discussed with the patient. The patient verbalized understanding of and has agreed to the management plan. Patient is aware to call the clinic if symptoms persist or worsen. Patient is aware when to return to the clinic for a follow-up visit. Patient educated on when it is appropriate to go to the emergency department.   Time call ended:  10:12  I provided 17 minutes of non-face-to-face time during this encounter.    [name] Hassell Done, FNP

## 2019-06-08 ENCOUNTER — Other Ambulatory Visit: Payer: Self-pay | Admitting: Nurse Practitioner

## 2019-06-08 DIAGNOSIS — I10 Essential (primary) hypertension: Secondary | ICD-10-CM

## 2019-06-08 DIAGNOSIS — E034 Atrophy of thyroid (acquired): Secondary | ICD-10-CM

## 2019-07-09 ENCOUNTER — Telehealth: Payer: Self-pay | Admitting: Adult Health

## 2019-07-09 NOTE — Telephone Encounter (Signed)
Okay with me.   KL

## 2019-07-09 NOTE — Telephone Encounter (Signed)
Spoke with pt, aware of kathryn's approval.

## 2019-07-09 NOTE — Telephone Encounter (Signed)
Patient has appt on 8/13 with Jory Sims patient wants to know if someone can come with her.

## 2019-07-09 NOTE — Telephone Encounter (Signed)
Spoke with pt, she needs help getting into the office and also they need to listen to what is said. They do not have a mobile phone so they can not be called during the appointment. Will get okay from [place]

## 2019-07-11 NOTE — Progress Notes (Signed)
Telephone Visit   Date:  07/12/2019   ID:  Debra, Campbell 1932/04/24, MRN [id number]  Patient location: Home Provider location: Home office  PCP:  Chevis Pretty, FNP  Cardiologist:  Glenetta Hew, MD  Electrophysiologist:  None   Evaluation Performed: Virtual visit follow-up  Chief Complaint:  Follow Up   History of Present Illness:    Debra Campbell is a 85 y.o. female we are following for ongoing assessment and management of paroxysmal atrial fibrillation, CHADS VASC Score of 5, on Xarelto, hypertension. Other history includes diabetes, hyperlipidemia, severe arthritis of the legs lower back and hands nonambulatory, and hypothyroidism.  On last office visit on 01/19/2019 the patient was seen by Dr. Ellyn Hack and was at baseline.  Her blood pressure was well controlled, however she was noted to have some orthostasis and therefore losartan was discontinued.  She was also continued on twice daily furosemide 40 mg and recommended to elevate her feet or wear zipper support stockings.  Today Mrs. Debra Campbell has no complaints.  She is unable to wear the support stockings as they are uncomfortable for her.  She continues to weigh almost every day and her weight remains unchanged with a baseline weight of 206 pounds.  She continues to have good response from her diuretic.  She is not as active due to severe arthritis.  She is medically compliant denies any bleeding with use of rivaroxaban, but does have some bruises which are not severe.   Mrs. Debra Campbell does not have symptoms concerning for COVID-19 infection (fever, chills, cough, or new shortness of breath).    Past Medical History:  Diagnosis Date   Aortic calcification ([place]) 2013   Atherosclerotic calcifications of the abdominal aorta and branch noted on CT Abd/Pelvis   Arthritis    legs, lower back, hands   Cancer ([place])    endometrial   Diabetes mellitus    Ectopic pregnancy    Eczema    Headache(784.0)    hx of    History of kidney stones    Hyperlipidemia    no meds   Hypertension    Hypothyroidism    Persistent atrial fibrillation 09/28/2016   Relatively new Dx: Asymptomatic.  Rate controlled without medication.   SVD (spontaneous vaginal delivery)    x 3   Thyroid disease    Past Surgical History:  Procedure Laterality Date   ABDOMINAL HYSTERECTOMY Bilateral 06/26/2013   Procedure: TOTAL ABDOMINAL HYSTERECTOMY WITH BILATERAL SALPINGO OOPHERECTOMY WITH PELVIC LYMPHADNECTOMY;  Surgeon: Alvino Chapel, MD;  Location: [facility];  Service: Gynecology;  Laterality: Bilateral;   BACK SURGERY  06/26/2013   ECTOPIC PREGNANCY SURGERY     HERNIA REPAIR     HYSTEROSCOPY W/D&C N/A 04/26/2013   Procedure: DILATATION AND CURETTAGE /HYSTEROSCOPY;  Surgeon: Woodroe Mode, MD;  Location: [facility];  Service: Gynecology;  Laterality: N/A;   JOINT REPLACEMENT     LAPAROTOMY     for ectopic pregnancy   LAPAROTOMY N/A 06/26/2013   Procedure: EXPLORATORY LAPAROTOMY;  Surgeon: Alvino Chapel, MD;  Location: [facility];  Service: Gynecology;  Laterality: N/A;   lt knee arthroscopy     rt total hip replacement     TONSILLECTOMY     TOTAL KNEE ARTHROPLASTY Left 12/22/2016   Procedure: LEFT TOTAL KNEE ARTHROPLASTY;  Surgeon: Latanya Maudlin, MD;  Location: [facility];  Service: Orthopedics;  Laterality: Left;   TRANSTHORACIC ECHOCARDIOGRAM  09/2018   EF 60-65%. Mild AS.  Mod-Severe PAH 60 mmHg  WISDOM TOOTH EXTRACTION       Current Meds  Medication Sig   acetaminophen (TYLENOL) 500 MG tablet Take 1 tablet (500 mg total) by mouth every 8 (eight) hours as needed.   Blood Glucose Monitoring Suppl (ACCU-CHEK AVIVA) device Use as instructed   furosemide (LASIX) 40 MG tablet Take 1 tablet (40 mg total) by mouth 2 (two) times daily. MAY TAKE AN ADDITIONAL 40 MG IF WEIGHT GAIN OF 3 LBS OR MORE OVER NIGHT   gabapentin (NEURONTIN) 300 MG capsule Take 1 capsule (300 mg total) by mouth 2 (two)  times daily.   glucose blood (ACCU-CHEK AVIVA PLUS) test strip Test 1x per day and prn. DX.E11.9   levothyroxine (SYNTHROID) 175 MCG tablet TAKE 1 TABLET EVERY DAY BEFORE BREAKFAST   magnesium gluconate (MAGONATE) 30 MG tablet Take 1 tablet (30 mg total) by mouth daily.   metFORMIN (GLUCOPHAGE) 1000 MG tablet Take 1 tablet (1,000 mg total) by mouth 2 (two) times daily with a meal.   metoprolol succinate (TOPROL-XL) 25 MG 24 hr tablet TAKE 1 TABLET EVERY DAY   nystatin cream (MYCOSTATIN) Apply 1 application topically 2 (two) times daily.   potassium chloride (K-DUR) 10 MEQ tablet Take 1 tablet (10 mEq total) by mouth every Sunday.   rivaroxaban (XARELTO) 20 MG TABS tablet TAKE 1 TABLET EVERY DAY WITH BREAKFAST   triamcinolone cream (KENALOG) 0.5 % APPLY 1 APPLICATION TOPICALLY THREE TIMES DAILY AS NEEDED.   TRUEPLUS LANCETS 30G MISC CHECK BLOOD SUGAR ONCE A DAY AND AS NEEDED   vitamin B-12 1000 MCG tablet Take 1 tablet (1,000 mcg total) by mouth daily.     Allergies:   Ace inhibitors, Iohexol, Statins, Penicillins, and Sulfa antibiotics   Social History   Tobacco Use   Smoking status: Never Smoker   Smokeless tobacco: Never Used  Substance Use Topics   Alcohol use: No   Drug use: No     Family Hx: The patient's family history includes Arthritis in her mother; Breast cancer in her daughter and sister; Cancer in her daughter and sister; Heart attack in her father; Heart disease in her father and sister; Neuropathy in her sister; Suicidality (age of onset: 32) in her brother; Thyroid disease in her father.  ROS:   Please see the history of present illness.    All other systems reviewed and are negative.   Prior CV studies:   The following studies were reviewed today:  Echocardiogram 10/12/2018  Left ventricle: The cavity size was normal. Wall thickness was   normal. Systolic function was normal. The estimated ejection   fraction was in the range of 60% to 65%. Wall  motion was normal;   there were no regional wall motion abnormalities. - Aortic valve: Mildly to moderately calcified annulus. Moderately   thickened, moderately calcified leaflets. There was mild   stenosis. Valve area (VTI): 2.34 cm^2. Valve area (Vmax): 2.52   cm^2. Valve area (Vmean): 2.71 cm^2. - Mitral valve: There was mild regurgitation. - Left atrium: The atrium was mildly dilated. - Right ventricle: Systolic function was mildly reduced. - Right atrium: The atrium was mildly dilated. - Tricuspid valve: There was moderate regurgitation. - Pulmonary arteries: Systolic pressure was moderately to severely   increased. PA peak pressure: 60 mm Hg (S).  Labs/Other Tests and Data Reviewed:    EKG: Not completed as this is a telephone virtual visit  Recent Labs: 10/09/2018: B Natriuretic Peptide 357.2 10/17/2018: Hemoglobin 13.1; Platelets 302 12/01/2018: Magnesium 1.5 02/01/2019: ALT  14; BUN 13; Creatinine, Ser 1.11; Potassium 4.4; Sodium 141; TSH 0.882   Recent Lipid Panel Lab Results  Component Value Date/Time   CHOL 208 (H) 02/01/2019 11:42 AM   CHOL 230 (H) 03/19/2013 10:34 AM   TRIG 149 02/01/2019 11:42 AM   TRIG 253 (H) 05/01/2015 09:13 AM   TRIG 149 03/19/2013 10:34 AM   HDL 50 02/01/2019 11:42 AM   HDL 42 05/01/2015 09:13 AM   HDL 52 03/19/2013 10:34 AM   CHOLHDL 4.2 02/01/2019 11:42 AM   LDLCALC 128 (H) 02/01/2019 11:42 AM   LDLCALC 122 (H) 07/11/2014 09:22 AM   LDLCALC 148 (H) 03/19/2013 10:34 AM    Wt Readings from Last 3 Encounters:  07/12/19 206 lb (93.4 kg)  02/01/19 206 lb (93.4 kg)  01/19/19 217 lb 6.4 oz (98.6 kg)     Objective:    Vital Signs:  Ht 5\' 4"  (1.626 m)    Wt 206 lb (93.4 kg)    BMI 35.36 kg/m  Unable to take blood pressures at home.  General: Awake alert oriented no acute distress Respiratory: Normal breathing while speaking on the phone, no coughing or wheezing Psych: Normal responses to questions and good conversational  skills.  ASSESSMENT & PLAN:    1.  Hypertension: Unable to obtain blood pressures at home.  Is followed every 3 months by her primary care provider.  We will not make any changes on her current regimen at this time.  There have been no concerns about elevated blood pressures, on review of prior office visits.  2.  Paroxysmal atrial fibrillation: Continues on metoprolol succinate 25 mg daily and rivaroxaban 20 mg daily.  The patient is unable to tell when she is having an irregular heart rhythm as she is completely asymptomatic.  We will continue current course of medications.  3.  Chronic lower extremity edema: Likely multifactorial in the setting of morbid obesity and dependent edema with sedentary lifestyle due to severe arthritis and wheelchair requirement.  She continues to take furosemide as directed.  She is uncomfortable wearing support hose.  Labs are completed every 3 months by PCP  COVID-19 Education: The signs and symptoms of COVID-19 were discussed with the patient and how to seek care for testing (follow up with PCP or arrange E-visit).  The importance of social distancing was discussed today.  She and her family are extremely careful and she does not get out in public unless it is required for doctor's appointments.  Time:   Today, I have spent 15 minutes with the patient with telehealth technology discussing the above problems.     Medication Adjustments/Labs and Tests Ordered: Current medicines are reviewed at length with the patient today.  Concerns regarding medicines are outlined above.   Tests Ordered: No orders of the defined types were placed in this encounter.   Medication Changes: No orders of the defined types were placed in this encounter.   Disposition:  Follow up 6 months with Dr. Ellyn Hack  Signed, Phill Myron. West Pugh, ANP, AACC  07/12/2019 10:01 AM    [facility]

## 2019-07-12 ENCOUNTER — Telehealth (INDEPENDENT_AMBULATORY_CARE_PROVIDER_SITE_OTHER): Payer: Medicare Other | Admitting: Adult Health

## 2019-07-12 ENCOUNTER — Encounter: Payer: Self-pay | Admitting: Adult Health

## 2019-07-12 VITALS — Ht 64.0 in | Wt 206.0 lb

## 2019-07-12 DIAGNOSIS — R600 Localized edema: Secondary | ICD-10-CM

## 2019-07-12 DIAGNOSIS — I480 Paroxysmal atrial fibrillation: Secondary | ICD-10-CM

## 2019-07-12 DIAGNOSIS — I10 Essential (primary) hypertension: Secondary | ICD-10-CM | POA: Diagnosis not present

## 2019-07-12 NOTE — Patient Instructions (Signed)
Medication Instructions:  Continue current medications  If you need a refill on your cardiac medications before your next appointment, please call your pharmacy.  Labwork: None Ordered   Testing/Procedures: None Ordered  Follow-Up: You will need a follow up appointment in 6 months.  Please call our office 2 months in advance to schedule this appointment.  You may see Glenetta Hew, MD or one of the following Advanced Practice Providers on your designated Care Team:   Rosaria Ferries, PA-C . Jory Sims, DNP, ANP     At [facility], you and your health needs are our priority.  As part of our continuing mission to provide you with exceptional heart care, we have created designated Provider Care Teams.  These Care Teams include your primary Cardiologist (physician) and Advanced Practice Providers (APPs -  Physician Assistants and Nurse Practitioners) who all work together to provide you with the care you need, when you need it.  Thank you for choosing [facility]!!

## 2019-07-19 ENCOUNTER — Other Ambulatory Visit: Payer: Self-pay | Admitting: Nurse Practitioner

## 2019-07-19 DIAGNOSIS — E1142 Type 2 diabetes mellitus with diabetic polyneuropathy: Secondary | ICD-10-CM

## 2019-08-27 ENCOUNTER — Ambulatory Visit: Payer: Self-pay | Admitting: Nurse Practitioner

## 2019-08-28 ENCOUNTER — Other Ambulatory Visit: Payer: Self-pay

## 2019-08-29 ENCOUNTER — Other Ambulatory Visit: Payer: Self-pay

## 2019-08-29 ENCOUNTER — Ambulatory Visit (INDEPENDENT_AMBULATORY_CARE_PROVIDER_SITE_OTHER): Payer: Medicare Other | Admitting: Nurse Practitioner

## 2019-08-29 ENCOUNTER — Encounter: Payer: Self-pay | Admitting: Nurse Practitioner

## 2019-08-29 VITALS — BP 128/77 | HR 87 | Temp 96.9°F | Resp 20 | Ht 64.0 in | Wt 204.0 lb

## 2019-08-29 DIAGNOSIS — E1142 Type 2 diabetes mellitus with diabetic polyneuropathy: Secondary | ICD-10-CM

## 2019-08-29 DIAGNOSIS — E034 Atrophy of thyroid (acquired): Secondary | ICD-10-CM

## 2019-08-29 DIAGNOSIS — I10 Essential (primary) hypertension: Secondary | ICD-10-CM | POA: Diagnosis not present

## 2019-08-29 DIAGNOSIS — I4819 Other persistent atrial fibrillation: Secondary | ICD-10-CM

## 2019-08-29 DIAGNOSIS — R609 Edema, unspecified: Secondary | ICD-10-CM | POA: Diagnosis not present

## 2019-08-29 DIAGNOSIS — R600 Localized edema: Secondary | ICD-10-CM

## 2019-08-29 DIAGNOSIS — E782 Mixed hyperlipidemia: Secondary | ICD-10-CM

## 2019-08-29 DIAGNOSIS — Z23 Encounter for immunization: Secondary | ICD-10-CM

## 2019-08-29 DIAGNOSIS — I272 Pulmonary hypertension, unspecified: Secondary | ICD-10-CM

## 2019-08-29 DIAGNOSIS — M8588 Other specified disorders of bone density and structure, other site: Secondary | ICD-10-CM | POA: Diagnosis not present

## 2019-08-29 DIAGNOSIS — E1159 Type 2 diabetes mellitus with other circulatory complications: Secondary | ICD-10-CM

## 2019-08-29 DIAGNOSIS — I152 Hypertension secondary to endocrine disorders: Secondary | ICD-10-CM

## 2019-08-29 LAB — BAYER DCA HB A1C WAIVED: HB A1C (BAYER DCA - WAIVED): 7 % — ABNORMAL HIGH (ref <7.0)

## 2019-08-29 MED ORDER — RIVAROXABAN 20 MG PO TABS: ORAL_TABLET | ORAL | 1 refills | Status: DC

## 2019-08-29 MED ORDER — GABAPENTIN 300 MG PO CAPS: 300.0000 mg | ORAL_CAPSULE | Freq: Two times a day (BID) | ORAL | 1 refills | Status: DC

## 2019-08-29 MED ORDER — MAGNESIUM GLUCONATE 30 MG PO TABS: 30.0000 mg | ORAL_TABLET | Freq: Every day | ORAL | 0 refills | Status: DC

## 2019-08-29 MED ORDER — FUROSEMIDE 40 MG PO TABS: 40.0000 mg | ORAL_TABLET | Freq: Two times a day (BID) | ORAL | 3 refills | Status: DC

## 2019-08-29 MED ORDER — LEVOTHYROXINE SODIUM 175 MCG PO TABS: ORAL_TABLET | ORAL | 1 refills | Status: DC

## 2019-08-29 MED ORDER — METFORMIN HCL 1000 MG PO TABS: ORAL_TABLET | ORAL | 1 refills | Status: DC

## 2019-08-29 MED ORDER — METOPROLOL SUCCINATE ER 25 MG PO TB24: 25.0000 mg | ORAL_TABLET | Freq: Every day | ORAL | 1 refills | Status: DC

## 2019-08-29 NOTE — Patient Instructions (Signed)
Diabetes Mellitus and Foot Care Foot care is an important part of your health, especially when you have diabetes. Diabetes may cause you to have problems because of poor blood flow (circulation) to your feet and legs, which can cause your skin to:  Become thinner and drier.  Break more easily.  Heal more slowly.  Peel and crack. You may also have nerve damage (neuropathy) in your legs and feet, causing decreased feeling in them. This means that you may not notice minor injuries to your feet that could lead to more serious problems. Noticing and addressing any potential problems early is the best way to prevent future foot problems. How to care for your feet Foot hygiene  Wash your feet daily with warm water and mild soap. Do not use hot water. Then, pat your feet and the areas between your toes until they are completely dry. Do not soak your feet as this can dry your skin.  Trim your toenails straight across. Do not dig under them or around the cuticle. File the edges of your nails with an emery board or nail file.  Apply a moisturizing lotion or petroleum jelly to the skin on your feet and to dry, brittle toenails. Use lotion that does not contain alcohol and is unscented. Do not apply lotion between your toes. Shoes and socks  Wear clean socks or stockings every day. Make sure they are not too tight. Do not wear knee-high stockings since they may decrease blood flow to your legs.  Wear shoes that fit properly and have enough cushioning. Always look in your shoes before you put them on to be sure there are no objects inside.  To break in new shoes, wear them for just a few hours a day. This prevents injuries on your feet. Wounds, scrapes, corns, and calluses  Check your feet daily for blisters, cuts, bruises, sores, and redness. If you cannot see the bottom of your feet, use a mirror or ask someone for help.  Do not cut corns or calluses or try to remove them with medicine.  If you  find a minor scrape, cut, or break in the skin on your feet, keep it and the skin around it clean and dry. You may clean these areas with mild soap and water. Do not clean the area with peroxide, alcohol, or iodine.  If you have a wound, scrape, corn, or callus on your foot, look at it several times a day to make sure it is healing and not infected. Check for: ? Redness, swelling, or pain. ? Fluid or blood. ? Warmth. ? Pus or a bad smell. General instructions  Do not cross your legs. This may decrease blood flow to your feet.  Do not use heating pads or hot water bottles on your feet. They may burn your skin. If you have lost feeling in your feet or legs, you may not know this is happening until it is too late.  Protect your feet from hot and cold by wearing shoes, such as at the beach or on hot pavement.  Schedule a complete foot exam at least once a year (annually) or more often if you have foot problems. If you have foot problems, report any cuts, sores, or bruises to your health care provider immediately. Contact a health care provider if:  You have a medical condition that increases your risk of infection and you have any cuts, sores, or bruises on your feet.  You have an injury that is not   healing.  You have redness on your legs or feet.  You feel burning or tingling in your legs or feet.  You have pain or cramps in your legs and feet.  Your legs or feet are numb.  Your feet always feel cold.  You have pain around a toenail. Get help right away if:  You have a wound, scrape, corn, or callus on your foot and: ? You have pain, swelling, or redness that gets worse. ? You have fluid or blood coming from the wound, scrape, corn, or callus. ? Your wound, scrape, corn, or callus feels warm to the touch. ? You have pus or a bad smell coming from the wound, scrape, corn, or callus. ? You have a fever. ? You have a red line going up your leg. Summary  Check your feet every day  for cuts, sores, red spots, swelling, and blisters.  Moisturize feet and legs daily.  Wear shoes that fit properly and have enough cushioning.  If you have foot problems, report any cuts, sores, or bruises to your health care provider immediately.  Schedule a complete foot exam at least once a year (annually) or more often if you have foot problems. This information is not intended to replace advice given to you by your health care provider. Make sure you discuss any questions you have with your health care provider. Document Released: 11/12/2000 Document Revised: 12/28/2017 Document Reviewed: 12/17/2016 Elsevier Patient Education  2020 Elsevier Inc.  

## 2019-08-29 NOTE — Progress Notes (Addendum)
Subjective:    Patient ID: Debra Campbell, female    DOB: [birth year], 85 y.o.   MRN: [id number]   Chief Complaint: medical management of chronic issues   HPI:  1. Essential hypertension No c/o chest pain, sob or headache. Does not check blood pressure at home. BP Readings from Last 3 Encounters:  02/01/19 118/73  01/19/19 126/66  10/31/18 116/71     2. Mixed hyperlipidemia Does not watch diet and does no exercise Lab Results  Component Value Date   CHOL 208 (H) 02/01/2019   HDL 50 02/01/2019   LDLCALC 128 (H) 02/01/2019   TRIG 149 02/01/2019   CHOLHDL 4.2 02/01/2019     3. Persistent atrial fibrillation Patient is on xeralto daily. Denies any bleeding problems. Had televisit with DR. Lawrence in August 2020. According to note no changes were made to plan of care.  4. Peripheral edema Has edema daily.   5. Hypothyroidism due to acquired atrophy of thyroid No problems that aware of. Lab Results  Component Value Date   TSH 0.882 02/01/2019     6. Diabetic polyneuropathy associated with type 2 diabetes mellitus (HCC) Has constant numbness and tingling of feet. Is on neurontin daily  7. Type 2 diabetes mellitus with diabetic polyneuropathy, without long-term current use of insulin (HCC) Fasting blood sugars at home are running from 120-140. She denies any low blood sugars. Lab Results  Component Value Date   HGBA1C 7.8 (H) 02/01/2019     8. Osteopenia of lumbar spine Has aged out of bone density. Does do some weight bearing throughout the day.  9. Pulmonary hypertension, unspecified ([place]) Blood pressure has been good  10. Morbid obesity ([place]) No recent weight changes Wt Readings from Last 3 Encounters:  08/29/19 204 lb (92.5 kg)  07/12/19 206 lb (93.4 kg)  02/01/19 206 lb (93.4 kg)   BMI Readings from Last 3 Encounters:  08/29/19 35.02 kg/m  07/12/19 35.36 kg/m  02/01/19 35.36 kg/m       Outpatient Encounter Medications as of 08/29/2019   Medication Sig  . acetaminophen (TYLENOL) 500 MG tablet Take 1 tablet (500 mg total) by mouth every 8 (eight) hours as needed.  . furosemide (LASIX) 40 MG tablet Take 1 tablet (40 mg total) by mouth 2 (two) times daily. MAY TAKE AN ADDITIONAL 40 MG IF WEIGHT GAIN OF 3 LBS OR MORE OVER NIGHT  . gabapentin (NEURONTIN) 300 MG capsule Take 1 capsule (300 mg total) by mouth 2 (two) times daily.  [name] glucose blood (ACCU-CHEK AVIVA PLUS) test strip Test 1x per day and prn. DX.E11.9  . levothyroxine (SYNTHROID) 175 MCG tablet TAKE 1 TABLET EVERY DAY BEFORE BREAKFAST  . magnesium gluconate (MAGONATE) 30 MG tablet Take 1 tablet (30 mg total) by mouth daily.  . metFORMIN (GLUCOPHAGE) 1000 MG tablet TAKE 1 TABLET TWICE DAILY WITH A MEAL  . metoprolol succinate (TOPROL-XL) 25 MG 24 hr tablet TAKE 1 TABLET EVERY DAY  . nystatin cream (MYCOSTATIN) Apply 1 application topically 2 (two) times daily.  . potassium chloride (K-DUR) 10 MEQ tablet Take 1 tablet (10 mEq total) by mouth every Sunday.  . rivaroxaban (XARELTO) 20 MG TABS tablet TAKE 1 TABLET EVERY DAY WITH BREAKFAST  . triamcinolone cream (KENALOG) 0.5 % APPLY 1 APPLICATION TOPICALLY THREE TIMES DAILY AS NEEDED.  [name] TRUEPLUS LANCETS 30G MISC CHECK BLOOD SUGAR ONCE A DAY AND AS NEEDED  . vitamin B-12 1000 MCG tablet Take 1 tablet (1,000 mcg total) by mouth daily.  Past Surgical History:  Procedure Laterality Date  . ABDOMINAL HYSTERECTOMY Bilateral 06/26/2013   Procedure: TOTAL ABDOMINAL HYSTERECTOMY WITH BILATERAL SALPINGO OOPHERECTOMY WITH PELVIC LYMPHADNECTOMY;  Surgeon: Alvino Chapel, MD;  Location: [facility];  Service: Gynecology;  Laterality: Bilateral;  . BACK SURGERY  06/26/2013  . ECTOPIC PREGNANCY SURGERY    . HERNIA REPAIR    . HYSTEROSCOPY W/D&C N/A 04/26/2013   Procedure: DILATATION AND CURETTAGE /HYSTEROSCOPY;  Surgeon: Woodroe Mode, MD;  Location: [facility];  Service: Gynecology;  Laterality: N/A;  . JOINT REPLACEMENT    .  LAPAROTOMY     for ectopic pregnancy  . LAPAROTOMY N/A 06/26/2013   Procedure: EXPLORATORY LAPAROTOMY;  Surgeon: Alvino Chapel, MD;  Location: [facility];  Service: Gynecology;  Laterality: N/A;  . lt knee arthroscopy    . rt total hip replacement    . TONSILLECTOMY    . TOTAL KNEE ARTHROPLASTY Left 12/22/2016   Procedure: LEFT TOTAL KNEE ARTHROPLASTY;  Surgeon: Latanya Maudlin, MD;  Location: [facility];  Service: Orthopedics;  Laterality: Left;  . TRANSTHORACIC ECHOCARDIOGRAM  09/2018   EF 60-65%. Mild AS.  Mod-Severe PAH 60 mmHg  . WISDOM TOOTH EXTRACTION      Family History  Problem Relation Age of Onset  . Heart disease Father   . Thyroid disease Father   . Heart attack Father   . Heart disease Sister        open heart surgery  . Cancer Sister        breast  . Neuropathy Sister        secondary to cancer treatment  . Breast cancer Sister   . Suicidality Brother 49  . Arthritis Mother   . Cancer Daughter   . Breast cancer Daughter     New complaints: None today  Social history: Her husband lives with her.   Controlled substance contract: n/a    Review of Systems  Constitutional: Negative for activity change and appetite change.  HENT: Negative.   Eyes: Negative for pain.  Respiratory: Negative for shortness of breath.   Cardiovascular: Negative for chest pain, palpitations and leg swelling.  Gastrointestinal: Negative for abdominal pain.  Endocrine: Negative for polydipsia.  Genitourinary: Negative.   Skin: Negative for rash.  Neurological: Negative for dizziness, weakness and headaches.  Hematological: Does not bruise/bleed easily.  Psychiatric/Behavioral: Negative.   All other systems reviewed and are negative.      Objective:   Physical Exam Vitals signs and nursing note reviewed.  Constitutional:      General: She is not in acute distress.    Appearance: Normal appearance. She is well-developed.  HENT:     Head: Normocephalic.     Nose: Nose  normal.  Eyes:     Pupils: Pupils are equal, round, and reactive to light.  Neck:     Musculoskeletal: Normal range of motion and neck supple.     Vascular: No carotid bruit or JVD.  Cardiovascular:     Rate and Rhythm: Normal rate. Rhythm irregular.     Heart sounds: Normal heart sounds.  Pulmonary:     Effort: Pulmonary effort is normal. No respiratory distress.     Breath sounds: Normal breath sounds. No wheezing or rales.  Chest:     Chest wall: No tenderness.  Abdominal:     General: Bowel sounds are normal. There is no distension or abdominal bruit.     Palpations: Abdomen is soft. There is no hepatomegaly, splenomegaly, mass or pulsatile  mass.     Tenderness: There is no abdominal tenderness.  Musculoskeletal: Normal range of motion.     Right lower leg: Edema (1+) present.     Left lower leg: Edema (1+) present.  Lymphadenopathy:     Cervical: No cervical adenopathy.  Skin:    General: Skin is warm and dry.  Neurological:     Mental Status: She is alert and oriented to person, place, and time.     Deep Tendon Reflexes: Reflexes are normal and symmetric.  Psychiatric:        Behavior: Behavior normal.        Thought Content: Thought content normal.        Judgment: Judgment normal.     BP 128/77   Pulse 87   Temp (!) 96.9 F (36.1 C) (Temporal)   Resp 20   Ht 5' 4"  (1.626 m)   Wt 204 lb (92.5 kg)   SpO2 97%   BMI 35.02 kg/m   hgba1c 7.0%      Assessment & Plan:  CYRENA KUCHENBECKER comes in today with chief complaint of Medical Management of Chronic Issues   Diagnosis and orders addressed:  1. Hypertension associated with type 2 diabetes mellitus (HCC) Low sodium diet - CMP14+EGFR  metoprolol succinate (TOPROL-XL) 25 MG 24 hr tablet; Take 1 tablet (25 mg total) by mouth daily.  Dispense: 90 tablet; Refill: 1 - magnesium gluconate (MAGONATE) 30 MG tablet; Take 1 tablet (30 mg total) by mouth daily.  Dispense: 30 tablet; Refill: 0  2. Mixed  hyperlipidemia Low fat diet - Lipid panel  3. Persistent atrial fibrillation Avoid caffeine - rivaroxaban (XARELTO) 20 MG TABS tablet; TAKE 1 TABLET EVERY DAY WITH BREAKFAST  Dispense: 90 tablet; Refill: 1 - rivaroxaban (XARELTO) 20 MG TABS tablet; TAKE 1 TABLET EVERY DAY WITH BREAKFAST  Dispense: 90 tablet; Refill: 1 4. Peripheral edema elevate legs when sitting - furosemide (LASIX) 40 MG tablet; Take 1 tablet (40 mg total) by mouth 2 (two) times daily. MAY TAKE AN ADDITIONAL 40 MG IF WEIGHT GAIN OF 3 LBS OR MORE OVER NIGHT  Dispense: 225 tablet; Refill: 3  5. Hypothyroidism due to acquired atrophy of thyroid - levothyroxine (SYNTHROID) 175 MCG tablet; TAKE 1 TABLET EVERY DAY BEFORE BREAKFAST  Dispense: 90 tablet; Refill: 1  6. Diabetic polyneuropathy associated with type 2 diabetes mellitus ([place]) Do not go barefooted - gabapentin (NEURONTIN) 300 MG capsule; Take 1 capsule (300 mg total) by mouth 2 (two) times daily.  Dispense: 180 capsule; Refill: 1  7. Type 2 diabetes mellitus with diabetic polyneuropathy, without long-term current use of insulin (HCC) Watch carbs in diet - Bayer DCA Hb A1c Waived - Microalbumin / creatinine urine ratio - metFORMIN (GLUCOPHAGE) 1000 MG tablet; TAKE 1 TABLET TWICE DAILY WITH A MEAL  Dispense: 180 tablet; Refill: 1  8. Osteopenia of lumbar spine Weight bearing exercise encouragaed  9. Pulmonary hypertension, unspecified ([place])  10. Morbid obesity ([place]) Discussed diet and exercise for person with BMI >25 Will recheck weight in 3-6 months    Labs pending Health Maintenance reviewed Diet and exercise encouraged  Follow up plan: 3 month   Angelica, FNP

## 2019-08-30 LAB — CMP14+EGFR
ALT: 15 IU/L (ref 0–32)
AST: 20 IU/L (ref 0–40)
Albumin/Globulin Ratio: 1.4 (ref 1.2–2.2)
Albumin: 3.9 g/dL (ref 3.6–4.6)
Alkaline Phosphatase: 65 IU/L (ref 39–117)
BUN/Creatinine Ratio: 12 (ref 12–28)
BUN: 12 mg/dL (ref 8–27)
Bilirubin Total: 0.4 mg/dL (ref 0.0–1.2)
CO2: 24 mmol/L (ref 20–29)
Calcium: 9.3 mg/dL (ref 8.7–10.3)
Chloride: 106 mmol/L (ref 96–106)
Creatinine, Ser: 0.98 mg/dL (ref 0.57–1.00)
GFR calc Af Amer: 61 mL/min/{1.73_m2} (ref >59)
GFR calc non Af Amer: 53 mL/min/{1.73_m2} — ABNORMAL LOW (ref >59)
Globulin, Total: 2.8 g/dL (ref 1.5–4.5)
Glucose: 168 mg/dL — ABNORMAL HIGH (ref 65–99)
Potassium: 4.3 mmol/L (ref 3.5–5.2)
Sodium: 144 mmol/L (ref 134–144)
Total Protein: 6.7 g/dL (ref 6.0–8.5)

## 2019-08-30 LAB — MICROALBUMIN / CREATININE URINE RATIO
Creatinine, Urine: 225 mg/dL
Microalb/Creat Ratio: 8 mg/g creat (ref 0–29)
Microalbumin, Urine: 18.9 ug/mL

## 2019-08-30 LAB — LIPID PANEL
Chol/HDL Ratio: 3.4 ratio (ref 0.0–4.4)
Cholesterol, Total: 179 mg/dL (ref 100–199)
HDL: 53 mg/dL (ref >39)
LDL Chol Calc (NIH): 103 mg/dL — ABNORMAL HIGH (ref 0–99)
Triglycerides: 132 mg/dL (ref 0–149)
VLDL Cholesterol Cal: 23 mg/dL (ref 5–40)

## 2019-09-06 ENCOUNTER — Other Ambulatory Visit: Payer: Self-pay | Admitting: Nurse Practitioner

## 2019-09-06 DIAGNOSIS — R609 Edema, unspecified: Secondary | ICD-10-CM

## 2019-10-09 ENCOUNTER — Telehealth: Payer: Self-pay | Admitting: Nurse Practitioner

## 2019-10-09 NOTE — Chronic Care Management (AMB) (Signed)
Chronic Care Management   Note  10/09/2019 Name: Debra Campbell MRN: [id number] DOB: 12-Jun-1932  Debra Campbell is a 85 y.o. year old female who is a primary care patient of Chevis Pretty, FNP. I reached out to Gerre Pebbles by phone today in response to a referral sent by [name]'s health plan.     [name] was given information about Chronic Care Management services today including:  1. CCM service includes personalized support from designated clinical staff supervised by her physician, including individualized plan of care and coordination with other care providers 2. 24/7 contact phone numbers for assistance for urgent and routine care needs. 3. Service will only be billed when office clinical staff spend 20 minutes or more in a month to coordinate care. 4. Only one practitioner may furnish and bill the service in a calendar month. 5. The patient may stop CCM services at any time (effective at the end of the month) by phone call to the office staff. 6. The patient will be responsible for cost sharing (co-pay) of up to 20% of the service fee (after annual deductible is met).  Patient agreed to services and verbal consent obtained.   Follow up plan: Telephone appointment with CCM team member scheduled for: 10/17/2019  [place], [facility]  [place], [place] [id number] Direct Dial: [email or web address]  Website: [email or web address]

## 2019-10-17 ENCOUNTER — Ambulatory Visit: Payer: Medicare Other | Admitting: *Deleted

## 2019-10-17 DIAGNOSIS — R609 Edema, unspecified: Secondary | ICD-10-CM

## 2019-10-17 DIAGNOSIS — I872 Venous insufficiency (chronic) (peripheral): Secondary | ICD-10-CM

## 2019-10-17 DIAGNOSIS — I10 Essential (primary) hypertension: Secondary | ICD-10-CM

## 2019-10-17 DIAGNOSIS — E1142 Type 2 diabetes mellitus with diabetic polyneuropathy: Secondary | ICD-10-CM

## 2019-10-17 NOTE — Chronic Care Management (AMB) (Addendum)
Chronic Care Management   Initial Visit Note  10/17/2019 Name: Debra Campbell MRN: [id number] DOB: 1932/09/13  Referred by: Chevis Pretty, FNP Reason for referral : Chronic Care Management (RN Initial Visit)   Debra Campbell is a 85 y.o. year old female who is a primary care patient of Chevis Pretty, [place]. The CCM team was consulted for assistance with chronic disease management and care coordination needs related to HTN, DMII and peripheral neuropathy, PVD, peripheral edema  Review of patient status, including review of consultants reports, relevant laboratory and other test results, and collaboration with appropriate care team members and the patient's provider was performed as part of comprehensive patient evaluation and provision of chronic care management services.    SDOH (Social Determinants of Health) screening performed today: Physical Activity. See Care Plan for related entries.   Subjective: I spoke with Debra Campbell by telephone today. She reports having developed a rash on both lower legs. She has chronic peripheral edema, PVD, diabetes, and this is likely venous stasis dermatitis. She endorses having had this in the past and she had her legs wrapped at PCP office. She reports that she is not physically active and only does a few things around the house. Most of her day is spent in her lift chair with her feet elevated. She tends to sleep in this chair as well because it is more comfortable than her bed. She is a little reluctant to participate in the CCM program but will give it a try.   Objective:  Outpatient Encounter Medications as of 10/17/2019  Medication Sig Note  . acetaminophen (TYLENOL) 500 MG tablet Take 1 tablet (500 mg total) by mouth every 8 (eight) hours as needed.   . furosemide (LASIX) 40 MG tablet Take 1 tablet (40 mg total) by mouth 2 (two) times daily. MAY TAKE AN ADDITIONAL 40 MG IF WEIGHT GAIN OF 3 LBS OR MORE OVER NIGHT 10/17/2019: Taking once  a day  . gabapentin (NEURONTIN) 300 MG capsule Take 1 capsule (300 mg total) by mouth 2 (two) times daily.   [name] glucose blood (ACCU-CHEK AVIVA PLUS) test strip Test 1x per day and prn. DX.E11.9   . levothyroxine (SYNTHROID) 175 MCG tablet TAKE 1 TABLET EVERY DAY BEFORE BREAKFAST   . magnesium gluconate (MAGONATE) 30 MG tablet Take 1 tablet (30 mg total) by mouth daily.   . metFORMIN (GLUCOPHAGE) 1000 MG tablet TAKE 1 TABLET TWICE DAILY WITH A MEAL   . metoprolol succinate (TOPROL-XL) 25 MG 24 hr tablet Take 1 tablet (25 mg total) by mouth daily.   [name] nystatin cream (MYCOSTATIN) Apply 1 application topically 2 (two) times daily.   . potassium chloride (K-DUR) 10 MEQ tablet Take 1 tablet (10 mEq total) by mouth every Sunday.   . rivaroxaban (XARELTO) 20 MG TABS tablet TAKE 1 TABLET EVERY DAY WITH BREAKFAST   . triamcinolone cream (KENALOG) 0.5 % APPLY 1 APPLICATION TOPICALLY THREE TIMES DAILY AS NEEDED.   [name] TRUEPLUS LANCETS 30G MISC CHECK BLOOD SUGAR ONCE A DAY AND AS NEEDED   . vitamin B-12 1000 MCG tablet Take 1 tablet (1,000 mcg total) by mouth daily.    No facility-administered encounter medications on file as of 10/17/2019.     Lab Results  Component Value Date   HGBA1C 7.0 (H) 08/29/2019   HGBA1C 7.8 (H) 02/01/2019   HGBA1C 6.7 (H) 10/10/2018   Lab Results  Component Value Date   MICROALBUR 20 05/01/2015   LDLCALC 103 (H) 08/29/2019  CREATININE 0.98 08/29/2019     RN Assessment & Care Plan     . Improve Peripheral Edema and Dermatitis (pt-stated)       Current Barriers:  . Transportation barriers . Chronic Disease Management support and education needs related to peripheral edema and venous stasis dermatitis  Nurse Case Manager Clinical Goal(s):  [name] Over the next 30 days, patient will work with Consulting civil engineer to improve the condition of her legs . Over the next 30 days, patient will not develop any ulcerations or wounds on her lower legs  Interventions:  . Chart reviewed  . Discussed current condition o Red, blistery rash on around both ankles. Developed this week after wearing socks to help with edema. Does not like compression hose.  [name] Provided verbal education on peripheral edema and venous stasis dermatitis. Patient endorses having this in the past.  . Cautioned patient to not hit, scratch, bump, etc her lower legs. Avoid all trauma if at all possible.  . Encourage patient to wear pants or socks over her legs at all times to decrease likelihood of trauma . Encouraged her to continue applying kenalog daily . Provided my RN Care Manager direct number [phone] . Encouraged patient to reach out with any questions, concerns, or worsening of condition . Recommended an appointment with PCP but patient declined at this time . Recommended home health nursing services to assess and apply [employer]. Patient declined at this time.  . Encouraged patient to elevate legs when sitting  Patient Self Care Activities:  . Self administers medications as prescribed . Calls pharmacy for medication refills . Calls provider office for new concerns or questions . Unable to independently leave home  Initial goal documentation       . RN CCM Goal        General Goal for Embedded Chronic Care Management Program  Current Barriers:  . Chronic Disease Management support and education needs related to HTN, Afib, DM, Neuropathy, Arthritis, PVD, peripheral edema  Nurse Case Manager Clinical Goal(s):  [name] Over the next 30 days, Debra. Campbell will meet with the RN Care Manager to set goals related to the self-management of her chronic medical conditions.   Interventions:  . A review of relevant office notes, correspondence notes, imaging studies, lab reports, history and health care maintenance items was performed and pertinent items were discussed with the patient . Collaboration with PCP and appropriate care team members performed as necessary . Medications reviewed with the  patient and reconciled . Intake Assessment, General Assessments, and Social Determinants of Health were completed (any needs identified are addressed in a separate goal) . CCM resources reviewed and team member roles explained. All questions were answered. . Patient provided with CCM contact information and recommended that she reach out with any chronic care management needs . Will mail printed materials with CCM consent information, CCM contact information, current goals, and follow-up plan  Patient Self Care Activities:  . Self administers medications as prescribed . Calls pharmacy for medication refills . Calls provider office for new concerns or questions  Initial goal documentation         Follow-up Plan The care management team will reach out to the patient again over the next 7 days.    Chong Sicilian, BSN, RN-BC Embedded Chronic Care Manager [facility] [place] [facility] Direct Dial: [phone]   "I have reviewed this encounter including the documentation in this note and/or discussed this patient with the nurse coordinator,  Chong Sicilian, RN . I am certifying that I agree with the content of this note as supervising physician." [place], FNP

## 2019-10-17 NOTE — Patient Instructions (Signed)
Visit Information  Goals Addressed            This Visit's Progress     Patient Stated   . Improve Peripheral Edema and Dermatitis (pt-stated)       Current Barriers:  . Transportation barriers . Chronic Disease Management support and education needs related to peripheral edema and venous stasis dermatitis  Nurse Case Manager Clinical Goal(s):  [name] Over the next 30 days, patient will work with Consulting civil engineer to improve the condition of her legs . Over the next 30 days, patient will not develop any ulcerations or wounds on her lower legs  Interventions:  . Chart reviewed . Discussed current condition o Red, blistery rash on around both ankles. Developed this week after wearing socks to help with edema. Does not like compression hose.  [name] Provided verbal education on peripheral edema and venous stasis dermatitis. Patient endorses having this in the past.  . Cautioned patient to not hit, scratch, bump, etc her lower legs. Avoid all trauma if at all possible.  . Encourage patient to wear pants or socks over her legs at all times to decrease likelihood of trauma . Encouraged her to continue applying kenalog daily . Provided my RN Care Manager direct number [phone] . Encouraged patient to reach out with any questions, concerns, or worsening of condition . Recommended an appointment with PCP but patient declined at this time . Recommended home health nursing services to assess and apply [employer]. Patient declined at this time.  . Encouraged patient to elevate legs when sitting  Patient Self Care Activities:  . Self administers medications as prescribed . Calls pharmacy for medication refills . Calls provider office for new concerns or questions . Unable to independently leave home  Initial goal documentation       Other   . RN CCM Goal        General Goal for Embedded Chronic Care Management Program  Current Barriers:  . Chronic Disease Management support and education  needs related to HTN, Afib, DM, Neuropathy, Arthritis, PVD, peripheral edema  Nurse Case Manager Clinical Goal(s):  [name] Over the next 30 days, Debra Campbell will meet with the RN Care Manager to set goals related to the self-management of her chronic medical conditions.   Interventions:  . A review of relevant office notes, correspondence notes, imaging studies, lab reports, history and health care maintenance items was performed and pertinent items were discussed with the patient . Collaboration with PCP and appropriate care team members performed as necessary . Medications reviewed with the patient and reconciled . Intake Assessment, General Assessments, and Social Determinants of Health were completed (any needs identified are addressed in a separate goal) . CCM resources reviewed and team member roles explained. All questions were answered. . Patient provided with CCM contact information and recommended that she reach out with any chronic care management needs . Will mail printed materials with CCM consent information, CCM contact information, current goals, and follow-up plan  Patient Self Care Activities:  . Self administers medications as prescribed . Calls pharmacy for medication refills . Calls provider office for new concerns or questions  Initial goal documentation        Print copy of patient instructions provided.   The care management team will reach out to the patient again over the next 7 days.   Chong Sicilian, BSN, RN-BC Embedded Chronic Care Manager [facility] [place] [facility] Direct Dial: [phone]  Debra Campbell was given information about Chronic Care Management services today including:  1. CCM service includes personalized support from designated clinical staff supervised by her physician, including individualized plan of care and coordination with other care providers 2. 24/7 contact phone numbers for assistance for urgent and  routine care needs. 3. Service will only be billed when office clinical staff spend 20 minutes or more in a month to coordinate care. 4. Only one practitioner may furnish and bill the service in a calendar month. 5. The patient may stop CCM services at any time (effective at the end of the month) by phone call to the office staff. 6. The patient will be responsible for cost sharing (co-pay) of up to 20% of the service fee (after annual deductible is met).  Patient agreed to services and verbal consent obtained.

## 2019-10-22 ENCOUNTER — Telehealth: Payer: Medicare Other

## 2019-10-24 ENCOUNTER — Ambulatory Visit (INDEPENDENT_AMBULATORY_CARE_PROVIDER_SITE_OTHER): Payer: Medicare Other | Admitting: *Deleted

## 2019-10-24 DIAGNOSIS — I872 Venous insufficiency (chronic) (peripheral): Secondary | ICD-10-CM

## 2019-10-24 DIAGNOSIS — E1142 Type 2 diabetes mellitus with diabetic polyneuropathy: Secondary | ICD-10-CM | POA: Diagnosis not present

## 2019-10-24 DIAGNOSIS — R609 Edema, unspecified: Secondary | ICD-10-CM

## 2019-10-24 NOTE — Patient Instructions (Signed)
  RN ASSESSMENT & CARE PLAN   . I want to stop taking potassium (pt-stated)       Current Barriers:  [name] Knowledge Deficits related to need for potassium supplement  Nurse Case Manager Clinical Goal(s):  [name] Over the next 30 days, patient will work with RN Care Manager/PCP to determine if potassium supplementation is necessary  Interventions:  [name] Medication list reviewed and discussed potassium 52meq once weekly . Talked with patient o Patient advised that insurance will no longer cover potassium supplement with current directions of once weekly. Recommended OTC.  [name] Chart reviewed including recent lab results with normal potassium level . Collaboration with PCP regarding need for supplement . Advised patient that she can discontinue potassium . Advised that we will rck potassium level at next visit on 12/03/2018  Patient Self Care Activities:  . Performs ADL's independently . Performs IADL's independently   Initial goal documentation     . Improve Peripheral Edema and Dermatitis (pt-stated)       Current Barriers:  . Transportation barriers . Chronic Disease Management support and education needs related to peripheral edema and venous stasis dermatitis  Nurse Case Manager Clinical Goal(s):  [name] Over the next 30 days, patient will work with Consulting civil engineer to improve the condition of her legs . Over the next 30 days, patient will experience healing of right lower leg wound  Interventions:  . Chart reviewed . Discussed current condition o Hit right lower leg with bottom corner of car door 5 days ago o Wound about the "size of a thumbnail" o Copious amounts of clear drainage initially. Still large amount of clear drainage that saturates gauze o No purulent drainage. No redness or warmth.  o Applying polysporin and triamcinolone cream and covering with gauze secured by tape o Applying triamcinolone cream and calamine lotion to stasis dermatitis on both legs . Provided verbal education  s/s of infection . Advised to continue applying polysporin and triamcinolone cream and covering with gauze.  [name] Recommended conforming gauze of ACE wrap to secure gauze pad instead of tape . Advised to call provider on call at [phone] if she develops new or worsening symptoms while the office is closed . Reach out to me at [phone] during normal business hours as needed  . Cautioned to avoid further trauma  . Appointment scheduled with PCP for 10/30/2019 . Encouraged patient to elevate legs when sitting  Patient Self Care Activities:  . Self administers medications as prescribed . Calls pharmacy for medication refills . Calls provider office for new concerns or questions . Unable to independently leave home  Please see past updates related to this goal by clicking on the "Past Updates" button in the selected goal         Follow-up Plan Patient will keep appointment with PCP on 10/30/2019 Patient will seek medical attention for any new or worsening symptoms RN will f/u with patient by telephone over the next 7 days   Chong Sicilian, BSN, RN-BC [place] / [place]: [phone]   The patient verbalized understanding of instructions provided today and declined a print copy of patient instruction materials.

## 2019-10-24 NOTE — Chronic Care Management (AMB) (Addendum)
Chronic Care Management   Follow Up Note   10/24/2019 Name: Debra Campbell MRN: [id number] DOB: 11/23/1932  Referred by: Chevis Pretty, FNP Reason for referral : Chronic Care Management (RN Follow-up)   Debra Campbell is a 85 y.o. year old female who is a primary care patient of Chevis Pretty, FNP. The CCM team was consulted for assistance with chronic disease management and care coordination needs.    Review of patient status, including review of consultants reports, relevant laboratory and other test results, and collaboration with appropriate care team members and the patient's provider was performed as part of comprehensive patient evaluation and provision of chronic care management services.    SUBJECTIVE: I spoke with Debra Campbell today regarding her venous stasis dermatitis of both lower legs and her potassium supplementation. She reports having injured her right lower leg with the bottom corner of the car door 5 days ago.    OBJECTIVE: Outpatient Encounter Medications as of 10/24/2019  Medication Sig Note  . acetaminophen (TYLENOL) 500 MG tablet Take 1 tablet (500 mg total) by mouth every 8 (eight) hours as needed.   . furosemide (LASIX) 40 MG tablet Take 1 tablet (40 mg total) by mouth 2 (two) times daily. MAY TAKE AN ADDITIONAL 40 MG IF WEIGHT GAIN OF 3 LBS OR MORE OVER NIGHT 10/17/2019: Taking once a day  . gabapentin (NEURONTIN) 300 MG capsule Take 1 capsule (300 mg total) by mouth 2 (two) times daily.   [name] glucose blood (ACCU-CHEK AVIVA PLUS) test strip Test 1x per day and prn. [id number].E11.9   . levothyroxine (SYNTHROID) 175 MCG tablet TAKE 1 TABLET EVERY DAY BEFORE BREAKFAST   . magnesium gluconate (MAGONATE) 30 MG tablet Take 1 tablet (30 mg total) by mouth daily.   . metFORMIN (GLUCOPHAGE) 1000 MG tablet TAKE 1 TABLET TWICE DAILY WITH A MEAL   . metoprolol succinate (TOPROL-XL) 25 MG 24 hr tablet Take 1 tablet (25 mg total) by mouth daily.   [name] nystatin cream  (MYCOSTATIN) Apply 1 application topically 2 (two) times daily.   . potassium chloride (K-DUR) 10 MEQ tablet Take 1 tablet (10 mEq total) by mouth every Sunday.   . rivaroxaban (XARELTO) 20 MG TABS tablet TAKE 1 TABLET EVERY DAY WITH BREAKFAST   . triamcinolone cream (KENALOG) 0.5 % APPLY 1 APPLICATION TOPICALLY THREE TIMES DAILY AS NEEDED.   [name] TRUEPLUS LANCETS 30G MISC CHECK BLOOD SUGAR ONCE A DAY AND AS NEEDED   . vitamin B-12 1000 MCG tablet Take 1 tablet (1,000 mcg total) by mouth daily.    No facility-administered encounter medications on file as of 10/24/2019.       Chemistry      Component Value Date/Time   NA 144 08/29/2019 0916   K 4.3 08/29/2019 0916   CL 106 08/29/2019 0916   CO2 24 08/29/2019 0916   BUN 12 08/29/2019 0916   CREATININE 0.98 08/29/2019 0916   CREATININE 1.04 03/19/2013 1034      Component Value Date/Time   CALCIUM 9.3 08/29/2019 0916   ALKPHOS 65 08/29/2019 0916   AST 20 08/29/2019 0916   ALT 15 08/29/2019 0916   BILITOT 0.4 08/29/2019 0916      RN ASSESSMENT & CARE PLAN   . I want to stop taking potassium (pt-stated)       Current Barriers:  [name] Knowledge Deficits related to need for potassium supplement  Nurse Case Manager Clinical Goal(s):  [name] Over the next 30 days, patient will work with  RN Care Manager/PCP to determine if potassium supplementation is necessary  Interventions:  [name] Medication list reviewed and discussed potassium 52meq once weekly . Talked with patient o Patient advised that insurance will no longer cover potassium supplement with current directions of once weekly. Recommended OTC.  [name] Chart reviewed including recent lab results with normal potassium level . Collaboration with PCP regarding need for supplement . Advised patient that she can discontinue potassium . Advised that we will rck potassium level at next visit on 12/03/2018  Patient Self Care Activities:  . Performs ADL's independently . Performs IADL's independently    Initial goal documentation     . Improve Peripheral Edema and Dermatitis (pt-stated)       Current Barriers:  . Transportation barriers . Chronic Disease Management support and education needs related to peripheral edema and venous stasis dermatitis  Nurse Case Manager Clinical Goal(s):  [name] Over the next 30 days, patient will work with Consulting civil engineer to improve the condition of her legs . Over the next 30 days, patient will experience healing of right lower leg wound  Interventions:  . Chart reviewed . Discussed current condition o Hit right lower leg with bottom corner of car door 5 days ago o Wound about the "size of a thumbnail" o Copious amounts of clear drainage initially. Still large amount of clear drainage that saturates gauze o No purulent drainage. No redness or warmth.  o Applying polysporin and triamcinolone cream and covering with gauze secured by tape o Applying triamcinolone cream and calamine lotion to stasis dermatitis on both legs . Provided verbal education s/s of infection . Advised to continue applying polysporin and triamcinolone cream and covering with gauze.  [name] Recommended conforming gauze of ACE wrap to secure gauze pad instead of tape . Advised to call provider on call at [phone] if she develops new or worsening symptoms while the office is closed . Reach out to me at [phone] during normal business hours as needed  . Cautioned to avoid further trauma  . Appointment scheduled with PCP for 10/30/2019 . Encouraged patient to elevate legs when sitting  Patient Self Care Activities:  . Self administers medications as prescribed . Calls pharmacy for medication refills . Calls provider office for new concerns or questions . Unable to independently leave home  Please see past updates related to this goal by clicking on the "Past Updates" button in the selected goal         Follow-up Plan Patient will keep appointment with PCP on 10/30/2019 Patient  will seek medical attention for any new or worsening symptoms RN will f/u with patient by telephone over the next 7 days   Chong Sicilian, BSN, RN-BC [place] / [place]: [phone]    "I have reviewed this encounter including the documentation in this note and/or discussed this patient with the nurse coordinator, Chong Sicilian, RN . I am certifying that I agree with the content of this note as supervising physician." Aline, FNP

## 2019-10-30 ENCOUNTER — Ambulatory Visit: Payer: Medicare Other | Admitting: Nurse Practitioner

## 2019-11-14 ENCOUNTER — Encounter: Payer: Self-pay | Admitting: Family Medicine

## 2019-11-14 ENCOUNTER — Ambulatory Visit (INDEPENDENT_AMBULATORY_CARE_PROVIDER_SITE_OTHER): Payer: Medicare Other | Admitting: *Deleted

## 2019-11-14 ENCOUNTER — Other Ambulatory Visit: Payer: Self-pay

## 2019-11-14 ENCOUNTER — Ambulatory Visit (INDEPENDENT_AMBULATORY_CARE_PROVIDER_SITE_OTHER): Payer: Medicare Other | Admitting: Family Medicine

## 2019-11-14 DIAGNOSIS — Z0000 Encounter for general adult medical examination without abnormal findings: Secondary | ICD-10-CM

## 2019-11-14 DIAGNOSIS — R2242 Localized swelling, mass and lump, left lower limb: Secondary | ICD-10-CM | POA: Diagnosis not present

## 2019-11-14 DIAGNOSIS — R609 Edema, unspecified: Secondary | ICD-10-CM | POA: Diagnosis not present

## 2019-11-14 MED ORDER — FUROSEMIDE 40 MG PO TABS: 80.0000 mg | ORAL_TABLET | Freq: Two times a day (BID) | ORAL | Status: DC

## 2019-11-14 MED ORDER — FUROSEMIDE 40 MG PO TABS: 80.0000 mg | ORAL_TABLET | Freq: Two times a day (BID) | ORAL | 1 refills | Status: DC

## 2019-11-14 MED ORDER — MUPIROCIN 2 % EX OINT: TOPICAL_OINTMENT | CUTANEOUS | 1 refills | Status: AC

## 2019-11-14 NOTE — Patient Instructions (Signed)
Elevate legs higher than heart level frequently. Dress leg daily with mupirocin and gauze

## 2019-11-14 NOTE — Progress Notes (Addendum)
MEDICARE ANNUAL WELLNESS VISIT  11/14/2019  Telephone Visit Disclaimer This Medicare AWV was conducted by telephone due to national recommendations for restrictions regarding the COVID-19 Pandemic (e.g. social distancing).  I verified, using two identifiers, that I am speaking with Debra Campbell or their authorized healthcare agent. I discussed the limitations, risks, security, and privacy concerns of performing an evaluation and management service by telephone and the potential availability of an in-person appointment in the future. The patient expressed understanding and agreed to proceed.   Subjective:  Debra Campbell is a 85 y.o. female patient of Chevis Pretty, [place] who had a Medicare Annual Wellness Visit today via telephone. 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 She lives with their husband. She has 3 children 1 daughter is deceased. She also has a cat. She reports that she is socially active and does interact with friends/family regularly. She is minimally physically active and enjoys reading, doing word search and jigsaw puzzles.  Patient Care Team: Chevis Pretty, FNP as PCP - General (Nurse Practitioner) Leonie Man, MD as PCP - Cardiology (Cardiology) Marti Sleigh, MD as Attending Physician (Gynecology) Latanya Maudlin, MD as Consulting Physician (Orthopedic Surgery) Leonie Man, MD as Consulting Physician (Cardiology) Ilean China, RN as Registered Nurse  Advanced Directives 11/14/2019 10/30/2018 10/09/2018 04/11/2018 10/26/2017 04/15/2017 01/24/2017  Does Patient Have a Medical Advance Directive? No Yes No Yes No Yes No  Type of Advance Directive - [place];Living will - [place];Living will -  Does patient want to make changes to medical advance directive? - No - Patient declined - - - - -  Copy of [place] in Chart? - No - copy  requested - No - copy requested - No - copy requested -  Would patient like information on creating a medical advance directive? No - Patient declined - No - Patient declined - No - Patient declined - -  Pre-existing out of facility DNR order (yellow form or pink MOST form) - - - - - - -    Hospital Utilization Over the Past 12 Months: # of hospitalizations or ER visits: 0 # of surgeries: 0  Review of Systems    Patient reports that her overall health is unchanged compared to last year.  History obtained from chart review and the patient Musculoskeletal ROS: positive for - pain in leg - left, swelling in foot - bilateral and leg - bilateral and bilateral ankles  Patient also has a wound on left leg.   Patient Reported Readings (BP, Pulse, CBG, Weight, etc) none  Pain Assessment Pain : No/denies pain     Current Medications & Allergies (verified) Allergies as of 11/14/2019      Reactions   Ace Inhibitors Cough   Iohexol     Desc: HIVES 40 YEARS AGO   Statins    myalgia   Penicillins Swelling, Rash   Has patient had a PCN reaction causing immediate rash, facial/tongue/throat swelling, SOB or lightheadedness with hypotension: Yes Has patient had a PCN reaction causing severe rash involving mucus membranes or skin necrosis: No Has patient had a PCN reaction that required hospitalization Yes Has patient had a PCN reaction occurring within the last 10 years: No If all of the above answers are "NO", then may proceed with Cephalosporin use.   Sulfa Antibiotics Rash      Medication List       Accurate  as of November 14, 2019 10:52 AM. If you have any questions, ask your nurse or doctor.        acetaminophen 500 MG tablet Commonly known as: TYLENOL Take 1 tablet (500 mg total) by mouth every 8 (eight) hours as needed.   cyanocobalamin 1000 MCG tablet Take 1 tablet (1,000 mcg total) by mouth daily.   furosemide 40 MG tablet Commonly known as: LASIX Take 1 tablet (40 mg  total) by mouth 2 (two) times daily. MAY TAKE AN ADDITIONAL 40 MG IF WEIGHT GAIN OF 3 LBS OR MORE OVER NIGHT   gabapentin 300 MG capsule Commonly known as: NEURONTIN Take 1 capsule (300 mg total) by mouth 2 (two) times daily.   glucose blood test strip Commonly known as: Accu-Chek Aviva Plus Test 1x per day and prn. DX.E11.9   levothyroxine 175 MCG tablet Commonly known as: SYNTHROID TAKE 1 TABLET EVERY DAY BEFORE BREAKFAST   magnesium gluconate 30 MG tablet Commonly known as: MAGONATE Take 1 tablet (30 mg total) by mouth daily.   metFORMIN 1000 MG tablet Commonly known as: GLUCOPHAGE TAKE 1 TABLET TWICE DAILY WITH A MEAL What changed:   how much to take  additional instructions   metoprolol succinate 25 MG 24 hr tablet Commonly known as: TOPROL-XL Take 1 tablet (25 mg total) by mouth daily.   nystatin cream Commonly known as: MYCOSTATIN Apply 1 application topically 2 (two) times daily.   potassium chloride 10 MEQ tablet Commonly known as: KLOR-CON Take 1 tablet (10 mEq total) by mouth every Sunday.   rivaroxaban 20 MG Tabs tablet Commonly known as: Xarelto TAKE 1 TABLET EVERY DAY WITH BREAKFAST   triamcinolone cream 0.5 % Commonly known as: KENALOG APPLY 1 APPLICATION TOPICALLY THREE TIMES DAILY AS NEEDED.   TRUEplus Lancets 30G Misc CHECK BLOOD SUGAR ONCE A DAY AND AS NEEDED       History (reviewed): Past Medical History:  Diagnosis Date  . Aortic calcification ([place]) 2013   Atherosclerotic calcifications of the abdominal aorta and branch noted on CT Abd/Pelvis  . Arthritis    legs, lower back, hands  . Cancer ([place])    endometrial  . Diabetes mellitus   . Ectopic pregnancy   . Eczema   . Headache(784.0)    hx of  . History of kidney stones   . Hyperlipidemia    no meds  . Hypertension   . Hypothyroidism   . Persistent atrial fibrillation ([place]) 09/28/2016   Relatively new Dx: Asymptomatic.  Rate controlled without medication.  . SVD  (spontaneous vaginal delivery)    x 3  . Thyroid disease    Past Surgical History:  Procedure Laterality Date  . ABDOMINAL HYSTERECTOMY Bilateral 06/26/2013   Procedure: TOTAL ABDOMINAL HYSTERECTOMY WITH BILATERAL SALPINGO OOPHERECTOMY WITH PELVIC LYMPHADNECTOMY;  Surgeon: Alvino Chapel, MD;  Location: [facility];  Service: Gynecology;  Laterality: Bilateral;  . BACK SURGERY  06/26/2013  . ECTOPIC PREGNANCY SURGERY    . HERNIA REPAIR    . HYSTEROSCOPY W/D&C N/A 04/26/2013   Procedure: DILATATION AND CURETTAGE /HYSTEROSCOPY;  Surgeon: Woodroe Mode, MD;  Location: [facility];  Service: Gynecology;  Laterality: N/A;  . JOINT REPLACEMENT    . LAPAROTOMY     for ectopic pregnancy  . LAPAROTOMY N/A 06/26/2013   Procedure: EXPLORATORY LAPAROTOMY;  Surgeon: Alvino Chapel, MD;  Location: [facility];  Service: Gynecology;  Laterality: N/A;  . lt knee arthroscopy    . rt total hip replacement    .  TONSILLECTOMY    . TOTAL KNEE ARTHROPLASTY Left 12/22/2016   Procedure: LEFT TOTAL KNEE ARTHROPLASTY;  Surgeon: Latanya Maudlin, MD;  Location: [facility];  Service: Orthopedics;  Laterality: Left;  . TRANSTHORACIC ECHOCARDIOGRAM  09/2018   EF 60-65%. Mild AS.  Mod-Severe PAH 60 mmHg  . WISDOM TOOTH EXTRACTION     Family History  Problem Relation Age of Onset  . Heart disease Father   . Thyroid disease Father   . Heart attack Father   . Heart disease Sister        open heart surgery  . Cancer Sister        breast  . Neuropathy Sister        secondary to cancer treatment  . Breast cancer Sister   . Suicidality Brother 32  . Arthritis Mother   . Cancer Daughter   . Breast cancer Daughter    Social History   Socioeconomic History  . Marital status: Married    Spouse name: Not on file  . Number of children: 3  . Years of education: Not on file  . Highest education level: High school graduate  Occupational History  . Occupation: Retired    Comment: [employer]  Tobacco Use  . Smoking  status: Never Smoker  . Smokeless tobacco: Never Used  Substance and Sexual Activity  . Alcohol use: No  . Drug use: No  . Sexual activity: Not Currently    Birth control/protection: Post-menopausal  Other Topics Concern  . Not on file  Social History Narrative  . Not on file   Social Determinants of Health   Financial Resource Strain:   . Difficulty of Paying Living Expenses: Not on file  Food Insecurity:   . Worried About Charity fundraiser in the Last Year: Not on file  . Ran Out of Food in the Last Year: Not on file  Transportation Needs:   . Lack of Transportation (Medical): Not on file  . Lack of Transportation (Non-Medical): Not on file  Physical Activity:   . Days of Exercise per Week: Not on file  . Minutes of Exercise per Session: Not on file  Stress:   . Feeling of Stress : Not on file  Social Connections:   . Frequency of Communication with Friends and Family: Not on file  . Frequency of Social Gatherings with Friends and Family: Not on file  . Attends Religious Services: Not on file  . Active Member of Clubs or Organizations: Not on file  . Attends Archivist Meetings: Not on file  . Marital Status: Not on file    Activities of Daily Living In your present state of health, do you have any difficulty performing the following activities: 11/14/2019 10/17/2019  Hearing? N N  Vision? N N  Comment wears glasses -  Difficulty concentrating or making decisions? N N  Walking or climbing stairs? Y Y  Comment knee replacement has some knee and hip pain  Dressing or bathing? N Y  Doing errands, shopping? N Y  Conservation officer, nature and eating ? N -  Using the Toilet? N -  In the past six months, have you accidently leaked urine? Y -  Comment at times -  Do you have problems with loss of bowel control? Y -  Comment at times -  Managing your Medications? N -  Managing your Finances? N -  Housekeeping or managing your Housekeeping? Y -  Comment Children help -   Some recent data might  be hidden    Patient Education/ Literacy How often do you need to have someone help you when you read instructions, pamphlets, or other written materials from your doctor or pharmacy?: 1 - Never  Exercise Current Exercise Habits: Home exercise routine, Type of exercise: walking, Time (Minutes): 15, Frequency (Times/Week): 3, Weekly Exercise (Minutes/Week): 45, Intensity: Mild, Exercise limited by: orthopedic condition(s)  Diet Patient reports consuming 2 meals a day and 1 snack(s) a day Patient reports that her primary diet is: Regular, Diabetic Patient reports that she does have regular access to food.   Depression Screen PHQ 2/9 Scores 11/14/2019 10/17/2019 08/29/2019 05/24/2019 02/01/2019 10/31/2018 10/30/2018  PHQ - 2 Score 0 0 0 0 0 0 0     Fall Risk Fall Risk  11/14/2019 10/17/2019 08/29/2019 02/01/2019 10/31/2018  Falls in the past year? 0 0 0 1 0  Comment - - - - -  Number falls in past yr: - 0 - 0 -  Injury with Fall? - 0 - 0 -  Risk for fall due to : Impaired mobility History of fall(s) - - -  Follow up Falls prevention discussed - - - -     Objective:  Debra Campbell seemed alert and oriented and she participated appropriately during our telephone visit.  Blood Pressure Weight BMI  BP Readings from Last 3 Encounters:  08/29/19 128/77  02/01/19 118/73  01/19/19 126/66   Wt Readings from Last 3 Encounters:  08/29/19 204 lb (92.5 kg)  07/12/19 206 lb (93.4 kg)  02/01/19 206 lb (93.4 kg)   BMI Readings from Last 1 Encounters:  08/29/19 35.02 kg/m    *Unable to obtain current vital signs, weight, and BMI due to telephone visit type  Hearing/Vision  . [place] did not seem to have difficulty with hearing/understanding during the telephone conversation . Reports that she has not had a formal eye exam by an eye care professional within the past year . Reports that she has not had a formal hearing evaluation within the past year *Unable to fully  assess hearing and vision during telephone visit type  Cognitive Function: 6CIT Screen 11/14/2019  What Year? 0 points  What month? 0 points  What time? 0 points  Count back from 20 0 points  Months in reverse 0 points  Repeat phrase 0 points  Total Score 0   (Normal:0-7, Significant for Dysfunction: >8)  Normal Cognitive Function Screening: Yes   Immunization & Health Maintenance Record Immunization History  Administered Date(s) Administered  . Fluad Quad(high Dose 65+) 08/29/2019  . Influenza, High Dose Seasonal PF 09/28/2016, 10/06/2017, 10/17/2018  . Influenza,inj,Quad PF,6+ Mos 09/26/2013, 09/09/2014, 10/13/2015  . Pneumococcal Conjugate-13 05/01/2015  . Pneumococcal Polysaccharide-23 03/29/2002  . Td 10/29/2008, 08/31/2011  . Tdap 08/31/2011  . Zoster Recombinat (Shingrix) 10/30/2018    Health Maintenance  Topic Date Due  . OPHTHALMOLOGY EXAM  11/29/2019 (Originally 10/31/2018)  . MAMMOGRAM  12/06/2019  . HEMOGLOBIN A1C  02/26/2020  . FOOT EXAM  08/28/2020  . URINE MICROALBUMIN  08/28/2020  . TETANUS/TDAP  08/30/2021  . INFLUENZA VACCINE  Completed  . DEXA SCAN  Completed  . PNA vac Low Risk Adult  Completed       Assessment  This is a routine wellness examination for Debra Campbell.  Health Maintenance: Due or Overdue There are no preventive care reminders to display for this patient.  Debra Campbell does not need a referral for Community Assistance: Care Management:   Patient is already  enrolled. Social Work:    no Prescription Assistance:  no Nutrition/Diabetes Education:  no   Plan:  Personalized Goals Goals Addressed            This Visit's Progress   . AWV       11/14/2019 AWV Goal: Fall Prevention  . Over the next year, patient will decrease their risk for falls by: o Using assistive devices, such as a cane or walker, as needed o Identifying fall risks within their home and correcting them by: - Removing throw rugs - Adding  handrails to stairs or ramps - Removing clutter and keeping a clear pathway throughout the home - Increasing light, especially at night - Adding shower handles/bars - Raising toilet seat o Identifying potential personal risk factors for falls: - Medication side effects - Incontinence/urgency - Vestibular dysfunction - Hearing loss - Musculoskeletal disorders - Neurological disorders - Orthostatic hypotension        Personalized Health Maintenance & Screening Recommendations  Diabetic Eye Exam   Lung Cancer Screening Recommended: no (Low Dose CT Chest recommended if Age 39-80 years, 30 pack-year currently smoking OR have quit w/in past 15 years) Hepatitis C Screening recommended: no HIV Screening recommended: no  Advanced Directives: Written information was not prepared per patient's request.  Referrals & Orders No orders of the defined types were placed in this encounter.   Follow-up Plan . Follow-up with Chevis Pretty, FNP as planned . Schedule appointment for today for wound and edema on left leg. . Schedule Diabetic eye exam   I have personally reviewed and noted the following in the patient's chart:   . Medical and social history . Use of alcohol, tobacco or illicit drugs  . Current medications and supplements . Functional ability and status . Nutritional status . Physical activity . Advanced directives . List of other physicians . Hospitalizations, surgeries, and ER visits in previous 12 months . Vitals . Screenings to include cognitive, depression, and falls . Referrals and appointments  In addition, I have reviewed and discussed with Debra Campbell certain preventive protocols, quality metrics, and best practice recommendations. A written personalized care plan for preventive services as well as general preventive health recommendations is available and can be mailed to the patient at her request.      Gareth Morgan, LPN  [id number]

## 2019-11-14 NOTE — Progress Notes (Signed)
Chief Complaint  Patient presents with  . Wound on left leg    HPI  Patient presents today for leg wound leaking water. Using fluid med. Not elevating the leg. Has a lot of swelling. Developed a sore last month when she bumped the left shin on the car door. Delayed healing in spite of use of neosporin dressings. Concerned the water coming out of it is preventing healing.   PMH: Smoking status noted ROS: Per HPI  Objective: BP 136/76   Pulse 78   Temp (!) 96.6 F (35.9 C) (Temporal)   Resp 20   Ht 5\' 4"  (1.626 m)   Wt 215 lb (97.5 kg)   SpO2 99%   BMI 36.90 kg/m  Gen: NAD, alert, cooperative with exam HEENT: NCAT, EOMI, PERRL CV: RRR, good S1/S2, no murmur Resp: CTABL, no wheezes, non-labored Ext: Massive edema, 4+ BLE. Serous fluid weepiing from the wound on the shin. The wound is 2X3 cm crusted. No sign of infection. warm Neuro: Alert and oriented, No gross deficits  Assessment and plan:  1. Peripheral edema     Meds ordered this encounter  Medications  . DISCONTD: furosemide (LASIX) 40 MG tablet    Sig: Take 2 tablets (80 mg total) by mouth 2 (two) times daily. MAY TAKE AN ADDITIONAL 40 MG IF WEIGHT GAIN OF 3 LBS OR MORE OVER NIGHT    Dispense:  225 tablet    PATIENT WILL CALL OFFICE WHEN RX IS NEEDED  . mupirocin ointment (BACTROBAN) 2 %    Sig: Apply and cover with bandage twice daily    Dispense:  30 g    Refill:  1  . furosemide (LASIX) 40 MG tablet    Sig: Take 2 tablets (80 mg total) by mouth 2 (two) times daily. MAY TAKE AN ADDITIONAL 40 MG IF WEIGHT GAIN OF 3 LBS OR MORE OVER NIGHT    Dispense:  360 tablet    Refill:  1    No orders of the defined types were placed in this encounter.   Follow up as needed.  Claretta Fraise, MD

## 2019-12-04 ENCOUNTER — Encounter: Payer: Self-pay | Admitting: Nurse Practitioner

## 2019-12-04 ENCOUNTER — Other Ambulatory Visit: Payer: Self-pay

## 2019-12-04 ENCOUNTER — Ambulatory Visit (INDEPENDENT_AMBULATORY_CARE_PROVIDER_SITE_OTHER): Payer: Medicare Other | Admitting: Nurse Practitioner

## 2019-12-04 VITALS — BP 111/72 | HR 91 | Temp 97.3°F | Resp 20 | Ht 64.0 in | Wt 201.0 lb

## 2019-12-04 DIAGNOSIS — E782 Mixed hyperlipidemia: Secondary | ICD-10-CM

## 2019-12-04 DIAGNOSIS — E1142 Type 2 diabetes mellitus with diabetic polyneuropathy: Secondary | ICD-10-CM

## 2019-12-04 DIAGNOSIS — R609 Edema, unspecified: Secondary | ICD-10-CM

## 2019-12-04 DIAGNOSIS — I4819 Other persistent atrial fibrillation: Secondary | ICD-10-CM | POA: Diagnosis not present

## 2019-12-04 DIAGNOSIS — E1159 Type 2 diabetes mellitus with other circulatory complications: Secondary | ICD-10-CM

## 2019-12-04 DIAGNOSIS — I10 Essential (primary) hypertension: Secondary | ICD-10-CM

## 2019-12-04 DIAGNOSIS — E034 Atrophy of thyroid (acquired): Secondary | ICD-10-CM

## 2019-12-04 LAB — BAYER DCA HB A1C WAIVED: HB A1C (BAYER DCA - WAIVED): 7.5 % — ABNORMAL HIGH (ref <7.0)

## 2019-12-04 MED ORDER — GABAPENTIN 300 MG PO CAPS: 300.0000 mg | ORAL_CAPSULE | Freq: Two times a day (BID) | ORAL | 1 refills | Status: DC

## 2019-12-04 MED ORDER — LEVOTHYROXINE SODIUM 175 MCG PO TABS: ORAL_TABLET | ORAL | 1 refills | Status: DC

## 2019-12-04 MED ORDER — RIVAROXABAN 20 MG PO TABS: ORAL_TABLET | ORAL | 1 refills | Status: DC

## 2019-12-04 MED ORDER — METOPROLOL SUCCINATE ER 25 MG PO TB24: 25.0000 mg | ORAL_TABLET | Freq: Every day | ORAL | 1 refills | Status: DC

## 2019-12-04 MED ORDER — METFORMIN HCL 1000 MG PO TABS: ORAL_TABLET | ORAL | 1 refills | Status: DC

## 2019-12-04 NOTE — Patient Instructions (Signed)

## 2019-12-04 NOTE — Progress Notes (Signed)
Subjective:    Patient ID: Debra Campbell, female    DOB: 25-Dec-1931, 86 y.o.   MRN: [id number]   Chief Complaint: Medical Management of Chronic Issues (3 mo )    HPI:  1. Essential hypertension No c/o chest pain, sob or headaches. Does not check blood pressure at home. BP Readings from Last 3 Encounters:  12/04/19 111/72  11/14/19 136/76  08/29/19 128/77     2. Mixed hyperlipidemia Does not watch diet and does very little exercise Lab Results  Component Value Date   CHOL 179 08/29/2019   HDL 53 08/29/2019   LDLCALC 103 (H) 08/29/2019   TRIG 132 08/29/2019   CHOLHDL 3.4 08/29/2019     3. Persistent atrial fibrillation Denies heart palpitations or feeling that heart os racing. Sh eis on xeralto and denies any bleeding.  4. Type 2 diabetes mellitus with diabetic polyneuropathy, without long-term current use of insulin ([place]) She does not check blood sugars vey often.  She does not watch diet. Lab Results  Component Value Date   HGBA1C 7.0 (H) 08/29/2019     5. Diabetic polyneuropathy associated with type 2 diabetes mellitus ([place]) She has numbness in toes on both feet and some in her fingers. Denies any sores on feet.  6. Hypothyroidism due to acquired atrophy of thyroid No problems that sh es awre of  7. Peripheral edema Always has some swelling of lower ext. Will get better  At times. We increased her lasix at last visit- now she is taking 2 every AM.  8. Morbid obesity ([place]) Weight is down 14lbs since last visit Wt Readings from Last 3 Encounters:  12/04/19 201 lb (91.2 kg)  11/14/19 215 lb (97.5 kg)  08/29/19 204 lb (92.5 kg)       Outpatient Encounter Medications as of 12/04/2019  Medication Sig  . acetaminophen (TYLENOL) 500 MG tablet Take 1 tablet (500 mg total) by mouth every 8 (eight) hours as needed.  . furosemide (LASIX) 40 MG tablet Take 2 tablets (80 mg total) by mouth 2 (two) times daily. MAY TAKE AN ADDITIONAL 40 MG IF WEIGHT GAIN OF 3 LBS  OR MORE OVER NIGHT  . gabapentin (NEURONTIN) 300 MG capsule Take 1 capsule (300 mg total) by mouth 2 (two) times daily.  [name] glucose blood (ACCU-CHEK AVIVA PLUS) test strip Test 1x per day and prn. DX.E11.9  . levothyroxine (SYNTHROID) 175 MCG tablet TAKE 1 TABLET EVERY DAY BEFORE BREAKFAST  . metFORMIN (GLUCOPHAGE) 1000 MG tablet TAKE 1 TABLET TWICE DAILY WITH A MEAL (Patient taking differently: 500 mg. TAKE 1/2 TABLET TWICE DAILY WITH A MEAL)  . metoprolol succinate (TOPROL-XL) 25 MG 24 hr tablet Take 1 tablet (25 mg total) by mouth daily.  . mupirocin ointment (BACTROBAN) 2 % Apply and cover with bandage twice daily  . rivaroxaban (XARELTO) 20 MG TABS tablet TAKE 1 TABLET EVERY DAY WITH BREAKFAST  . triamcinolone cream (KENALOG) 0.5 % APPLY 1 APPLICATION TOPICALLY THREE TIMES DAILY AS NEEDED.  [name] TRUEPLUS LANCETS 30G MISC CHECK BLOOD SUGAR ONCE A DAY AND AS NEEDED  . nystatin cream (MYCOSTATIN) Apply 1 application topically 2 (two) times daily. (Patient not taking: Reported on 12/04/2019)     Past Surgical History:  Procedure Laterality Date  . ABDOMINAL HYSTERECTOMY Bilateral 06/26/2013   Procedure: TOTAL ABDOMINAL HYSTERECTOMY WITH BILATERAL SALPINGO OOPHERECTOMY WITH PELVIC LYMPHADNECTOMY;  Surgeon: Alvino Chapel, MD;  Location: [facility];  Service: Gynecology;  Laterality: Bilateral;  . BACK SURGERY  06/26/2013  .  ECTOPIC PREGNANCY SURGERY    . HERNIA REPAIR    . HYSTEROSCOPY WITH D & C N/A 04/26/2013   Procedure: DILATATION AND CURETTAGE /HYSTEROSCOPY;  Surgeon: Woodroe Mode, MD;  Location: [facility];  Service: Gynecology;  Laterality: N/A;  . JOINT REPLACEMENT    . LAPAROTOMY     for ectopic pregnancy  . LAPAROTOMY N/A 06/26/2013   Procedure: EXPLORATORY LAPAROTOMY;  Surgeon: Alvino Chapel, MD;  Location: [facility];  Service: Gynecology;  Laterality: N/A;  . lt knee arthroscopy    . rt total hip replacement    . TONSILLECTOMY    . TOTAL KNEE ARTHROPLASTY Left 12/22/2016    Procedure: LEFT TOTAL KNEE ARTHROPLASTY;  Surgeon: Latanya Maudlin, MD;  Location: [facility];  Service: Orthopedics;  Laterality: Left;  . TRANSTHORACIC ECHOCARDIOGRAM  09/2018   EF 60-65%. Mild AS.  Mod-Severe PAH 60 mmHg  . WISDOM TOOTH EXTRACTION      Family History  Problem Relation Age of Onset  . Heart disease Father   . Thyroid disease Father   . Heart attack Father   . Heart disease Sister        open heart surgery  . Cancer Sister        breast  . Neuropathy Sister        secondary to cancer treatment  . Breast cancer Sister   . Suicidality Brother 40  . Arthritis Mother   . Cancer Daughter   . Breast cancer Daughter     New complaints: none today  Social history: Lives with her husband  Controlled substance contract: n/a    Review of Systems  Constitutional: Negative for diaphoresis.  HENT: Negative.   Eyes: Negative for pain.  Respiratory: Negative for shortness of breath.   Cardiovascular: Positive for leg swelling. Negative for chest pain and palpitations.  Gastrointestinal: Negative for abdominal pain.  Endocrine: Negative for polydipsia.  Genitourinary: Negative.   Skin: Negative for rash.  Neurological: Negative for dizziness, weakness and headaches.  Hematological: Does not bruise/bleed easily.  Psychiatric/Behavioral: Negative.   All other systems reviewed and are negative.      Objective:   Physical Exam Vitals and nursing note reviewed.  Constitutional:      General: She is not in acute distress.    Appearance: Normal appearance. She is well-developed.  HENT:     Head: Normocephalic.     Nose: Nose normal.  Eyes:     Pupils: Pupils are equal, round, and reactive to light.  Neck:     Vascular: No carotid bruit or JVD.  Cardiovascular:     Rate and Rhythm: Normal rate and regular rhythm.     Heart sounds: Murmur (1/6 heard loudest at  pulmonic valve) present.  Pulmonary:     Effort: Pulmonary effort is normal. No respiratory distress.      Breath sounds: Normal breath sounds. No wheezing or rales.  Chest:     Chest wall: No tenderness.  Abdominal:     General: Bowel sounds are normal. There is no distension or abdominal bruit.     Palpations: Abdomen is soft. There is no hepatomegaly, splenomegaly, mass or pulsatile mass.     Tenderness: There is no abdominal tenderness.  Musculoskeletal:        General: Normal range of motion.     Cervical back: Normal range of motion and neck supple.     Right lower leg: Edema present.     Left lower leg: Edema (1+) present.  Lymphadenopathy:     Cervical: No cervical adenopathy.  Skin:    General: Skin is warm and dry.  Neurological:     Mental Status: She is alert and oriented to person, place, and time.     Deep Tendon Reflexes: Reflexes are normal and symmetric.  Psychiatric:        Behavior: Behavior normal.        Thought Content: Thought content normal.        Judgment: Judgment normal.     BP 111/72   Pulse 91   Temp (!) 97.3 F (36.3 C)   Resp 20   Ht 5' 4"  (1.626 m)   Wt 201 lb (91.2 kg)   SpO2 96%   BMI 34.50 kg/m   HGBA1c 7.5%     Assessment & Plan:  Debra Campbell comes in today with chief complaint of Medical Management of Chronic Issues (3 mo )   Diagnosis and orders addressed:  1. Essential hypertension Low sodium diet - CBC with Differential/Platelet - CMP14+EGFR  2. Mixed hyperlipidemia Ow fat diet - CBC with Differential/Platelet - Lipid panel  3. Persistent atrial fibrillation Avoid caffeine - rivaroxaban (XARELTO) 20 MG TABS tablet; TAKE 1 TABLET EVERY DAY WITH BREAKFAST  Dispense: 90 tablet; Refill: 1  4. Type 2 diabetes mellitus with diabetic polyneuropathy, without long-term current use of insulin (HCC) Continue to watch carbs in diet - CBC with Differential/Platelet - Bayer DCA Hb A1c Waived - metFORMIN (GLUCOPHAGE) 1000 MG tablet; TAKE 1 TABLET TWICE DAILY WITH A MEAL  Dispense: 180 tablet; Refill: 1  5. Diabetic  polyneuropathy associated with type 2 diabetes mellitus (HCC) Do not g obarefooted - gabapentin (NEURONTIN) 300 MG capsule; Take 1 capsule (300 mg total) by mouth 2 (two) times daily.  Dispense: 180 capsule; Refill: 1  6. Hypothyroidism due to acquired atrophy of thyroid - levothyroxine (SYNTHROID) 175 MCG tablet; TAKE 1 TABLET EVERY DAY BEFORE BREAKFAST  Dispense: 90 tablet; Refill: 1  7. Peripheral edema Elevate legs when siting  8. Morbid obesity ([place]) Discussed diet and exercise for person with BMI >25 Will recheck weight in 3-6 months'  9. Hypertension associated with type 2 diabetes mellitus (HCC) Low sodium diet - metoprolol succinate (TOPROL-XL) 25 MG 24 hr tablet; Take 1 tablet (25 mg total) by mouth daily.  Dispense: 90 tablet; Refill: 1  Labs pending Health Maintenance reviewed Diet and exercise encouraged  Follow up plan: 3 months   [name] Hassell Done, FNP

## 2019-12-05 LAB — CBC WITH DIFFERENTIAL/PLATELET
Basophils Absolute: 0.1 10*3/uL (ref 0.0–0.2)
Basos: 1 %
EOS (ABSOLUTE): 0.3 10*3/uL (ref 0.0–0.4)
Eos: 5 %
Hematocrit: 42.1 % (ref 34.0–46.6)
Hemoglobin: 14.4 g/dL (ref 11.1–15.9)
Immature Grans (Abs): 0 10*3/uL (ref 0.0–0.1)
Immature Granulocytes: 0 %
Lymphocytes Absolute: 2.1 10*3/uL (ref 0.7–3.1)
Lymphs: 33 %
MCH: 30.4 pg (ref 26.6–33.0)
MCHC: 34.2 g/dL (ref 31.5–35.7)
MCV: 89 fL (ref 79–97)
Monocytes Absolute: 0.6 10*3/uL (ref 0.1–0.9)
Monocytes: 9 %
Neutrophils Absolute: 3.4 10*3/uL (ref 1.4–7.0)
Neutrophils: 52 %
Platelets: 203 10*3/uL (ref 150–450)
RBC: 4.73 x10E6/uL (ref 3.77–5.28)
RDW: 13.7 % (ref 11.7–15.4)
WBC: 6.5 10*3/uL (ref 3.4–10.8)

## 2019-12-05 LAB — CMP14+EGFR
ALT: 15 IU/L (ref 0–32)
AST: 21 IU/L (ref 0–40)
Albumin/Globulin Ratio: 1.3 (ref 1.2–2.2)
Albumin: 3.9 g/dL (ref 3.6–4.6)
Alkaline Phosphatase: 66 IU/L (ref 39–117)
BUN/Creatinine Ratio: 12 (ref 12–28)
BUN: 13 mg/dL (ref 8–27)
Bilirubin Total: 0.9 mg/dL (ref 0.0–1.2)
CO2: 24 mmol/L (ref 20–29)
Calcium: 8.9 mg/dL (ref 8.7–10.3)
Chloride: 99 mmol/L (ref 96–106)
Creatinine, Ser: 1.13 mg/dL — ABNORMAL HIGH (ref 0.57–1.00)
GFR calc Af Amer: 51 mL/min/{1.73_m2} — ABNORMAL LOW (ref >59)
GFR calc non Af Amer: 44 mL/min/{1.73_m2} — ABNORMAL LOW (ref >59)
Globulin, Total: 2.9 g/dL (ref 1.5–4.5)
Glucose: 230 mg/dL — ABNORMAL HIGH (ref 65–99)
Potassium: 3.4 mmol/L — ABNORMAL LOW (ref 3.5–5.2)
Sodium: 140 mmol/L (ref 134–144)
Total Protein: 6.8 g/dL (ref 6.0–8.5)

## 2019-12-05 LAB — LIPID PANEL
Chol/HDL Ratio: 3.2 ratio (ref 0.0–4.4)
Cholesterol, Total: 194 mg/dL (ref 100–199)
HDL: 61 mg/dL (ref >39)
LDL Chol Calc (NIH): 109 mg/dL — ABNORMAL HIGH (ref 0–99)
Triglycerides: 136 mg/dL (ref 0–149)
VLDL Cholesterol Cal: 24 mg/dL (ref 5–40)

## 2020-01-25 ENCOUNTER — Telehealth: Payer: Self-pay | Admitting: Nurse Practitioner

## 2020-01-25 ENCOUNTER — Telehealth: Payer: Self-pay | Admitting: Cardiology

## 2020-01-25 ENCOUNTER — Other Ambulatory Visit: Payer: Self-pay | Admitting: Family Medicine

## 2020-01-25 DIAGNOSIS — E1142 Type 2 diabetes mellitus with diabetic polyneuropathy: Secondary | ICD-10-CM

## 2020-01-25 MED ORDER — GABAPENTIN 300 MG PO CAPS: 300.0000 mg | ORAL_CAPSULE | Freq: Two times a day (BID) | ORAL | 1 refills | Status: DC

## 2020-01-25 NOTE — Telephone Encounter (Signed)
I sent this to [facility] so she could pick up today.

## 2020-01-25 NOTE — Telephone Encounter (Signed)
Attempted to call pt back but the phone line was busy-unable to reach her or leave a message.

## 2020-01-25 NOTE — Telephone Encounter (Signed)
  Pt called requesting if she can bring her niece Remo Lipps on her appt on 01/30/20. Pt said if she didn't answer please leave her a message.  Please advise

## 2020-01-25 NOTE — Telephone Encounter (Signed)
Covering pcp please advise. 

## 2020-01-25 NOTE — Telephone Encounter (Signed)
Patient has switched her prescription coverage to [employer] and just realized she needs her Gabapentin refilled.  She normally used [employer], but with the new prescription coverage she was not sure how to handle.  She will not have enough to get through the weekend.  Can this be sent in?

## 2020-01-25 NOTE — Telephone Encounter (Signed)
Aware. Script was done.

## 2020-01-28 NOTE — Telephone Encounter (Signed)
informed patient okay for niece to accompany her to appointment

## 2020-01-28 NOTE — Telephone Encounter (Signed)
Left message for pt to call.

## 2020-01-30 ENCOUNTER — Encounter: Payer: Self-pay | Admitting: Cardiology

## 2020-01-30 ENCOUNTER — Ambulatory Visit (INDEPENDENT_AMBULATORY_CARE_PROVIDER_SITE_OTHER): Payer: Medicare Other | Admitting: Cardiology

## 2020-01-30 ENCOUNTER — Other Ambulatory Visit: Payer: Self-pay

## 2020-01-30 VITALS — BP 148/78 | HR 87 | Ht 64.0 in | Wt 209.8 lb

## 2020-01-30 DIAGNOSIS — I350 Nonrheumatic aortic (valve) stenosis: Secondary | ICD-10-CM | POA: Diagnosis not present

## 2020-01-30 DIAGNOSIS — I272 Pulmonary hypertension, unspecified: Secondary | ICD-10-CM | POA: Diagnosis not present

## 2020-01-30 DIAGNOSIS — I700 Atherosclerosis of aorta: Secondary | ICD-10-CM | POA: Diagnosis not present

## 2020-01-30 DIAGNOSIS — R609 Edema, unspecified: Secondary | ICD-10-CM | POA: Diagnosis not present

## 2020-01-30 DIAGNOSIS — I10 Essential (primary) hypertension: Secondary | ICD-10-CM | POA: Diagnosis not present

## 2020-01-30 DIAGNOSIS — I4821 Permanent atrial fibrillation: Secondary | ICD-10-CM

## 2020-01-30 DIAGNOSIS — M545 Low back pain: Secondary | ICD-10-CM | POA: Diagnosis not present

## 2020-01-30 MED ORDER — RIVAROXABAN 20 MG PO TABS: ORAL_TABLET | ORAL | 3 refills | Status: DC

## 2020-01-30 NOTE — Progress Notes (Signed)
Primary Care Provider: Chevis Pretty, FNP Cardiologist: Glenetta Hew, MD Electrophysiologist: None  Clinic Note: Chief Complaint  Patient presents with  . Follow-up    No major issues  . Atrial Fibrillation    Relatively asymptomatic.  . Edema    Stable   HPI:    Debra Campbell is a 86 y.o. female with a PMH below who presents today for 24-month follow-up for A. fib.   CHA2DS2-VASc Score and unadjusted Ischemic Stroke Rate (% per year) is equal to 7.2 % stroke rate/year from a score of 5 (HTN, DM, Female, Age (2)).  -- on [place] was last seen by me back in February 2020.  She had recuperated from her hospitalization sepsis.  Still has little fatigue.  No symptoms of A. fib.  Not walking much, using her walker.  Did note some exertional dyspnea.  Sleeps on a recliner because of arthritis pains.  Chronic edema takes Lasix.  Did not like wearing the support even his appetite.  She was most recently seen on July 12, 2019 via telemedicine by Jory Sims, NP.  I had previously reduced and discontinued losartan because of orthostatic hypotension.  Recommended wearing zipper support stockings (but she said that they were uncomfortable).  Recent Hospitalizations: None  Reviewed  CV studies:    The following studies were reviewed today: (if available, images/films reviewed: From Epic Chart or Care Everywhere) . None:   Interval History:   Debra Campbell returns for her 15-month follow-up stating that she is doing fairly well.  She really does not notice being in A. fib.  She says that her heart rate may go up if she is doing more cardiac type exercise.  Every now and then she will feel it go fast for no reason, nothing prolonged.  No lightheadedness or dizziness associated.  No chest pain or pressure associated with it. She is his activity she is able to be, but is still somewhat deconditioned.  She does indicate that she will get short of breath if she  overdoes it.  Carrying things or going up stairs is difficult, but as much from a physical standpoint in any else.  She does not tolerate support stockings, and has a difficult time remembering to elevate her feet.  As such her swelling is there but it is stable and not anything worse than usual for her.  She is also reluctant to take additional dose of Lasix because of how much makes her go to the bathroom.  CV Review of Symptoms (Summary) positive for - dyspnea on exertion, edema, rapid heart rate and Noted in HPI negative for - chest pain, orthopnea, paroxysmal nocturnal dyspnea, shortness of breath or Syncope/near syncope, TIA/amaurosis fugax, claudication  The patient does not have symptoms concerning for COVID-19 infection (fever, chills, cough, or new shortness of breath).  The patient is practicing social distancing & Masking.  She has completed both of her COVID-19 vaccines  REVIEWED OF SYSTEMS   Review of Systems  Constitutional: Positive for malaise/fatigue (More deconditioning and lack of energy.). Negative for weight loss (None recently.  Had some weight loss and gained it back.  Some was related to diuresis.).  HENT: Negative for congestion and nosebleeds.   Respiratory: Positive for shortness of breath (Only with exertion).   Cardiovascular: Positive for leg swelling (End of day swelling 1-2+).  Gastrointestinal: Negative for blood in stool and melena.       Indigestion  Genitourinary: Negative for flank pain  and hematuria.  Musculoskeletal: Positive for back pain and joint pain (Shoulders, right hip and left knee).  Neurological: Positive for dizziness (With standing up.). Negative for focal weakness and loss of consciousness.  Psychiatric/Behavioral: Positive for memory loss (Mild).   I have reviewed and (if needed) personally updated the patient's problem list, medications, allergies, past medical and surgical history, social and family history.   PAST MEDICAL HISTORY    Past Medical History:  Diagnosis Date  . Aortic calcification ([place]) 2013   Atherosclerotic calcifications of the abdominal aorta and branch noted on CT Abd/Pelvis  . Arthritis    legs, lower back, hands  . Cancer ([place])    endometrial  . Diabetes mellitus   . Ectopic pregnancy   . Eczema   . Headache(784.0)    hx of  . History of kidney stones   . Hyperlipidemia    no meds  . Hypertension   . Hypothyroidism   . Persistent atrial fibrillation ([place]) 09/28/2016   Relatively new Dx: Asymptomatic.  Rate controlled without medication.  . SVD (spontaneous vaginal delivery)    x 3  . Thyroid disease     PAST SURGICAL HISTORY   Past Surgical History:  Procedure Laterality Date  . ABDOMINAL HYSTERECTOMY Bilateral 06/26/2013   Procedure: TOTAL ABDOMINAL HYSTERECTOMY WITH BILATERAL SALPINGO OOPHERECTOMY WITH PELVIC LYMPHADNECTOMY;  Surgeon: Alvino Chapel, MD;  Location: [facility];  Service: Gynecology;  Laterality: Bilateral;  . BACK SURGERY  06/26/2013  . ECTOPIC PREGNANCY SURGERY    . HERNIA REPAIR    . HYSTEROSCOPY WITH D & C N/A 04/26/2013   Procedure: DILATATION AND CURETTAGE /HYSTEROSCOPY;  Surgeon: Woodroe Mode, MD;  Location: [place] [facility];  Service: Gynecology;  Laterality: N/A;  . JOINT REPLACEMENT    . LAPAROTOMY     for ectopic pregnancy  . LAPAROTOMY N/A 06/26/2013   Procedure: EXPLORATORY LAPAROTOMY;  Surgeon: Alvino Chapel, MD;  Location: [facility];  Service: Gynecology;  Laterality: N/A;  . lt knee arthroscopy    . rt total hip replacement    . TONSILLECTOMY    . TOTAL KNEE ARTHROPLASTY Left 12/22/2016   Procedure: LEFT TOTAL KNEE ARTHROPLASTY;  Surgeon: Latanya Maudlin, MD;  Location: [facility];  Service: Orthopedics;  Laterality: Left;  . TRANSTHORACIC ECHOCARDIOGRAM  09/2018   EF 60-65%. Mild AS.  Mod-Severe PAH 60 mmHg  . WISDOM TOOTH EXTRACTION      MEDICATIONS/ALLERGIES   Current Meds  Medication Sig  . acetaminophen (TYLENOL) 500 MG tablet Take 1  tablet (500 mg total) by mouth every 8 (eight) hours as needed.  . furosemide (LASIX) 40 MG tablet Take 2 tablets (80 mg total) by mouth 2 (two) times daily. MAY TAKE AN ADDITIONAL 40 MG IF WEIGHT GAIN OF 3 LBS OR MORE OVER NIGHT  . gabapentin (NEURONTIN) 300 MG capsule Take 1 capsule (300 mg total) by mouth 2 (two) times daily.  [name] glucose blood (ACCU-CHEK AVIVA PLUS) test strip Test 1x per day and prn. DX.E11.9  . levothyroxine (SYNTHROID) 175 MCG tablet TAKE 1 TABLET EVERY DAY BEFORE BREAKFAST  . metFORMIN (GLUCOPHAGE) 1000 MG tablet TAKE 1 TABLET TWICE DAILY WITH A MEAL  . metoprolol succinate (TOPROL-XL) 25 MG 24 hr tablet Take 1 tablet (25 mg total) by mouth daily.  [name] nystatin cream (MYCOSTATIN) Apply 1 application topically 2 (two) times daily.  . rivaroxaban (XARELTO) 20 MG TABS tablet TAKE 1 TABLET EVERY DAY WITH BREAKFAST  . triamcinolone cream (KENALOG) 0.5 % APPLY 1  APPLICATION TOPICALLY THREE TIMES DAILY AS NEEDED.  [name] TRUEPLUS LANCETS 30G MISC CHECK BLOOD SUGAR ONCE A DAY AND AS NEEDED  . [DISCONTINUED] rivaroxaban (XARELTO) 20 MG TABS tablet TAKE 1 TABLET EVERY DAY WITH BREAKFAST    Allergies  Allergen Reactions  . Ace Inhibitors Cough  . Iohexol      Desc: HIVES 40 YEARS AGO   . Statins     myalgia  . Penicillins Swelling and Rash    Has patient had a PCN reaction causing immediate rash, facial/tongue/throat swelling, SOB or lightheadedness with hypotension: Yes Has patient had a PCN reaction causing severe rash involving mucus membranes or skin necrosis: No Has patient had a PCN reaction that required hospitalization Yes Has patient had a PCN reaction occurring within the last 10 years: No If all of the above answers are "NO", then may proceed with Cephalosporin use.   . Sulfa Antibiotics Rash    SOCIAL HISTORY/FAMILY HISTORY   Reviewed in Epic:  Pertinent findings: No change  OBJCTIVE -PE, EKG, labs   Wt Readings from Last 3 Encounters:  01/30/20 209 lb 12.8 oz  (95.2 kg)  12/04/19 201 lb (91.2 kg)  11/14/19 215 lb (97.5 kg)  =- diuresis  Physical Exam: BP (!) 148/78   Pulse 87   Ht 5\' 4"  (1.626 m)   Wt 209 lb 12.8 oz (95.2 kg)   BMI 36.01 kg/m  Physical Exam  Constitutional: She is oriented to person, place, and time. She appears well-developed and well-nourished. No distress.  Well-groomed.  Morbidly obese.  HENT:  Head: Normocephalic and atraumatic.  Neck: No hepatojugular reflux and no JVD (6-7 cm water.) present. Carotid bruit is not present.  Cardiovascular: Normal rate, intact distal pulses and normal pulses. An irregularly irregular rhythm present.  No extrasystoles are present. PMI is not displaced. Exam reveals no gallop and no friction rub.  Murmur (1-2/6 SEM at RUSB.) heard. Pulmonary/Chest: Effort normal and breath sounds normal. No respiratory distress. She has no wheezes. She has no rales.  Musculoskeletal:        General: Edema (Stable 1-2+ bilateral LE edema) present. Normal range of motion.     Cervical back: Normal range of motion and neck supple.     Comments: Slow, antalgic gait.  Neurological: She is alert and oriented to person, place, and time.  Psychiatric: She has a normal mood and affect. Her behavior is normal. Judgment and thought content normal.  Vitals reviewed.   Adult ECG Report  Rate: 87 ;  Rhythm: atrial fibrillation and premature ventricular contractions (PVC);   Narrative Interpretation: stable EKG   Recent Labs:    Lab Results  Component Value Date   CHOL 194 12/04/2019   HDL 61 12/04/2019   LDLCALC 109 (H) 12/04/2019   TRIG 136 12/04/2019   CHOLHDL 3.2 12/04/2019   Lab Results  Component Value Date   CREATININE 1.13 (H) 12/04/2019   BUN 13 12/04/2019   NA 140 12/04/2019   K 3.4 (L) 12/04/2019   CL 99 12/04/2019   CO2 24 12/04/2019   Lab Results  Component Value Date   TSH 0.882 02/01/2019    ASSESSMENT/PLAN    Problem List Items Addressed This Visit    Essential hypertension  (Chronic)   Relevant Medications   rivaroxaban (XARELTO) 20 MG TABS tablet   Other Relevant Orders   EKG 12-Lead (Completed)   Morbid obesity (HCC) (Chronic)    Encouraged monitoring her diet and try to increase exercise activity level.  Permanent atrial fibrillation ([place]) CHA2DS2Vasc = 5; Xarelto - Primary (Chronic)    Asymptomatic for the most part unless he is active.  Rate controlled on beta-blocker.  Failed cardioversion in the past. Anticoagulated with Xarelto.  No bleeding issues.      Relevant Medications   rivaroxaban (XARELTO) 20 MG TABS tablet   Other Relevant Orders   EKG 12-Lead (Completed)   Aortic calcification (HCC) (Chronic)    Aortic atherosclerotic disease.  Not on expected an 86 year old.  Does increase risk for stroke from chads vascular, but otherwise no evidence of stenosis or aneurysmal dilation. Plan: Continue to monitor blood pressure.  She is only on low-dose Toprol with relatively stable pressures. Has not been on statin, but LDL is 109 on no medications.  Continue to monitor.      Relevant Medications   rivaroxaban (XARELTO) 20 MG TABS tablet   Peripheral edema (Chronic)    Remains on a stable dose of furosemide.  Continue to recommend foot elevation.  She remains flat out refuses to use support stockings.  Suggested that she try using Ace wraps.      Pulmonary hypertension, unspecified (HCC)    Echocardiogram showed moderate to severe pulmonary hypertension.  Back clearly has slightly do with some exertional dyspnea, but not really all that symptomatic.  There may be a component of diastolic dysfunction related to this, but also suspect a component of obesity hypoventilation syndrome.  Plan: Continue current dose of furosemide and indicated that she could take additional dose as needed.      Relevant Medications   rivaroxaban (XARELTO) 20 MG TABS tablet   Mild aortic stenosis by prior echocardiogram    At age 52, I doubt that her valve will  become more significant.  We will follow murmur, and probably would not recheck echocardiogram unless symptoms warrant, or murmur worsens.      Relevant Medications   rivaroxaban (XARELTO) 20 MG TABS tablet   Other Relevant Orders   EKG 12-Lead (Completed)       COVID-19 Education: The signs and symptoms of COVID-19 were discussed with the patient and how to seek care for testing (follow up with PCP or arrange E-visit).   The importance of social distancing was discussed today.  I spent a total of 17 minutes with the patient. >  50% of the time was spent in direct patient consultation.  Additional time spent with chart review  / charting (studies, outside notes, etc): 6 Total Time: 23 min   Current medicines are reviewed at length with the patient today.  (+/- concerns) n/a   Patient Instructions / Medication Changes & Studies & Tests Ordered   Patient Instructions  Medication Instructions:   NO CHANGES  KEEP TAKING FUROSEMIDE AS YOU HAVE BEEN *If you need a refill on your cardiac medications before your next appointment, please call your pharmacy*   Lab Work: NOT NEEDED If you have labs (blood work) drawn today and your tests are completely normal, you will receive your results only by: [name] MyChart Message (if you have MyChart) OR . A paper copy in the mail If you have any lab test that is abnormal or we need to change your treatment, we will call you to review the results.   Testing/Procedures: NOT NEEEDED   Follow-Up: At [facility], you and your health needs are our priority.  As part of our continuing mission to provide you with exceptional heart care, we have created designated Provider Care Teams.  These  Care Teams include your primary Cardiologist (physician) and Advanced Practice Providers (APPs -  Physician Assistants and Nurse Practitioners) who all work together to provide you with the care you need, when you need it.     Your next appointment:   12  month(s)  The format for your next appointment:   In Person  Provider:   Glenetta Hew, MD   Other Instructions N/A    Studies Ordered:   Orders Placed This Encounter  Procedures  . EKG 12-Lead     Glenetta Hew, M.D., M.S. Interventional Cardiologist   Pager # [phone] Phone # [phone] [address] [place], [place] [address]   Thank you for choosing Heartcare at [facility]!!

## 2020-01-30 NOTE — Patient Instructions (Signed)
Medication Instructions:   NO CHANGES  KEEP TAKING FUROSEMIDE AS YOU HAVE BEEN *If you need a refill on your cardiac medications before your next appointment, please call your pharmacy*   Lab Work: NOT NEEDED If you have labs (blood work) drawn today and your tests are completely normal, you will receive your results only by: [name] MyChart Message (if you have MyChart) OR . A paper copy in the mail If you have any lab test that is abnormal or we need to change your treatment, we will call you to review the results.   Testing/Procedures: NOT NEEEDED   Follow-Up: At [facility], you and your health needs are our priority.  As part of our continuing mission to provide you with exceptional heart care, we have created designated Provider Care Teams.  These Care Teams include your primary Cardiologist (physician) and Advanced Practice Providers (APPs -  Physician Assistants and Nurse Practitioners) who all work together to provide you with the care you need, when you need it.     Your next appointment:   12 month(s)  The format for your next appointment:   In Person  Provider:   Glenetta Hew, MD   Other Instructions N/A

## 2020-02-06 ENCOUNTER — Encounter: Payer: Self-pay | Admitting: Cardiology

## 2020-02-06 NOTE — Assessment & Plan Note (Signed)
Echocardiogram showed moderate to severe pulmonary hypertension.  Back clearly has slightly do with some exertional dyspnea, but not really all that symptomatic.  There may be a component of diastolic dysfunction related to this, but also suspect a component of obesity hypoventilation syndrome.  Plan: Continue current dose of furosemide and indicated that she could take additional dose as needed.

## 2020-02-06 NOTE — Assessment & Plan Note (Signed)
Aortic atherosclerotic disease.  Not on expected an 86 year old.  Does increase risk for stroke from chads vascular, but otherwise no evidence of stenosis or aneurysmal dilation. Plan: Continue to monitor blood pressure.  She is only on low-dose Toprol with relatively stable pressures. Has not been on statin, but LDL is 109 on no medications.  Continue to monitor.

## 2020-02-06 NOTE — Assessment & Plan Note (Addendum)
Asymptomatic for the most part unless he is active.  Rate controlled on beta-blocker.  Failed cardioversion in the past. Anticoagulated with Xarelto.  No bleeding issues.

## 2020-02-06 NOTE — Assessment & Plan Note (Signed)
Remains on a stable dose of furosemide.  Continue to recommend foot elevation.  She remains flat out refuses to use support stockings.  Suggested that she try using Ace wraps.

## 2020-02-06 NOTE — Assessment & Plan Note (Signed)
Encouraged monitoring her diet and try to increase exercise activity level.

## 2020-02-06 NOTE — Assessment & Plan Note (Signed)
At age 86, I doubt that her valve will become more significant.  We will follow murmur, and probably would not recheck echocardiogram unless symptoms warrant, or murmur worsens.

## 2020-03-04 ENCOUNTER — Encounter: Payer: Self-pay | Admitting: Nurse Practitioner

## 2020-03-04 ENCOUNTER — Ambulatory Visit: Payer: Self-pay | Admitting: Nurse Practitioner

## 2020-03-04 ENCOUNTER — Other Ambulatory Visit: Payer: Self-pay

## 2020-03-04 ENCOUNTER — Ambulatory Visit (INDEPENDENT_AMBULATORY_CARE_PROVIDER_SITE_OTHER): Payer: Medicare Other | Admitting: Nurse Practitioner

## 2020-03-04 VITALS — BP 116/77 | HR 84 | Temp 98.4°F | Resp 20 | Ht 64.0 in | Wt 207.0 lb

## 2020-03-04 DIAGNOSIS — R609 Edema, unspecified: Secondary | ICD-10-CM | POA: Diagnosis not present

## 2020-03-04 DIAGNOSIS — I10 Essential (primary) hypertension: Secondary | ICD-10-CM

## 2020-03-04 DIAGNOSIS — E034 Atrophy of thyroid (acquired): Secondary | ICD-10-CM | POA: Diagnosis not present

## 2020-03-04 DIAGNOSIS — M8949 Other hypertrophic osteoarthropathy, multiple sites: Secondary | ICD-10-CM | POA: Diagnosis not present

## 2020-03-04 DIAGNOSIS — E782 Mixed hyperlipidemia: Secondary | ICD-10-CM

## 2020-03-04 DIAGNOSIS — M8588 Other specified disorders of bone density and structure, other site: Secondary | ICD-10-CM | POA: Diagnosis not present

## 2020-03-04 DIAGNOSIS — I4821 Permanent atrial fibrillation: Secondary | ICD-10-CM

## 2020-03-04 DIAGNOSIS — E1142 Type 2 diabetes mellitus with diabetic polyneuropathy: Secondary | ICD-10-CM | POA: Diagnosis not present

## 2020-03-04 DIAGNOSIS — E1159 Type 2 diabetes mellitus with other circulatory complications: Secondary | ICD-10-CM | POA: Diagnosis not present

## 2020-03-04 DIAGNOSIS — M159 Polyosteoarthritis, unspecified: Secondary | ICD-10-CM

## 2020-03-04 DIAGNOSIS — I152 Hypertension secondary to endocrine disorders: Secondary | ICD-10-CM

## 2020-03-04 LAB — BAYER DCA HB A1C WAIVED: HB A1C (BAYER DCA - WAIVED): 9.8 % — ABNORMAL HIGH (ref <7.0)

## 2020-03-04 MED ORDER — METFORMIN HCL 500 MG PO TABS: 500.0000 mg | ORAL_TABLET | Freq: Two times a day (BID) | ORAL | 1 refills | Status: DC

## 2020-03-04 MED ORDER — LEVOTHYROXINE SODIUM 175 MCG PO TABS: ORAL_TABLET | ORAL | 1 refills | Status: DC

## 2020-03-04 MED ORDER — METOPROLOL SUCCINATE ER 25 MG PO TB24: 25.0000 mg | ORAL_TABLET | Freq: Every day | ORAL | 1 refills | Status: DC

## 2020-03-04 MED ORDER — GLIPIZIDE 5 MG PO TABS: 2.5000 mg | ORAL_TABLET | Freq: Two times a day (BID) | ORAL | 1 refills | Status: DC

## 2020-03-04 MED ORDER — FUROSEMIDE 40 MG PO TABS: 80.0000 mg | ORAL_TABLET | Freq: Two times a day (BID) | ORAL | 1 refills | Status: DC

## 2020-03-04 MED ORDER — BLOOD GLUCOSE METER KIT: PACK | 0 refills | Status: DC

## 2020-03-04 MED ORDER — RIVAROXABAN 20 MG PO TABS: ORAL_TABLET | ORAL | 1 refills | Status: DC

## 2020-03-04 MED ORDER — GABAPENTIN 300 MG PO CAPS: 300.0000 mg | ORAL_CAPSULE | Freq: Two times a day (BID) | ORAL | 1 refills | Status: DC

## 2020-03-04 NOTE — Patient Instructions (Signed)
Carbohydrate Counting for Diabetes Mellitus, Adult  Carbohydrate counting is a method of keeping track of how many carbohydrates you eat. Eating carbohydrates naturally increases the amount of sugar (glucose) in the blood. Counting how many carbohydrates you eat helps keep your blood glucose within normal limits, which helps you manage your diabetes (diabetes mellitus). It is important to know how many carbohydrates you can safely have in each meal. This is different for every person. A diet and nutrition specialist (registered dietitian) can help you make a meal plan and calculate how many carbohydrates you should have at each meal and snack. Carbohydrates are found in the following foods:  Grains, such as breads and cereals.  Dried beans and soy products.  Starchy vegetables, such as potatoes, peas, and corn.  Fruit and fruit juices.  Milk and yogurt.  Sweets and snack foods, such as cake, cookies, candy, chips, and soft drinks. How do I count carbohydrates? There are two ways to count carbohydrates in food. You can use either of the methods or a combination of both. Reading "Nutrition Facts" on packaged food The "Nutrition Facts" list is included on the labels of almost all packaged foods and beverages in the [place] It includes:  The serving size.  Information about nutrients in each serving, including the grams (g) of carbohydrate per serving. To use the "Nutrition Facts":  Decide how many servings you will have.  Multiply the number of servings by the number of carbohydrates per serving.  The resulting number is the total amount of carbohydrates that you will be having. Learning standard serving sizes of other foods When you eat carbohydrate foods that are not packaged or do not include "Nutrition Facts" on the label, you need to measure the servings in order to count the amount of carbohydrates:  Measure the foods that you will eat with a food scale or measuring cup, if  needed.  Decide how many standard-size servings you will eat.  Multiply the number of servings by 15. Most carbohydrate-rich foods have about 15 g of carbohydrates per serving. ? For example, if you eat 8 oz (170 g) of strawberries, you will have eaten 2 servings and 30 g of carbohydrates (2 servings x 15 g = 30 g).  For foods that have more than one food mixed, such as soups and casseroles, you must count the carbohydrates in each food that is included. The following list contains standard serving sizes of common carbohydrate-rich foods. Each of these servings has about 15 g of carbohydrates:   hamburger bun or  English muffin.   oz (15 mL) syrup.   oz (14 g) jelly.  1 slice of bread.  1 six-inch tortilla.  3 oz (85 g) cooked rice or pasta.  4 oz (113 g) cooked dried beans.  4 oz (113 g) starchy vegetable, such as peas, corn, or potatoes.  4 oz (113 g) hot cereal.  4 oz (113 g) mashed potatoes or  of a large baked potato.  4 oz (113 g) canned or frozen fruit.  4 oz (120 mL) fruit juice.  4-6 crackers.  6 chicken nuggets.  6 oz (170 g) unsweetened dry cereal.  6 oz (170 g) plain fat-free yogurt or yogurt sweetened with artificial sweeteners.  8 oz (240 mL) milk.  8 oz (170 g) fresh fruit or one small piece of fruit.  24 oz (680 g) popped popcorn. Example of carbohydrate counting Sample meal  3 oz (85 g) chicken breast.  6 oz (170 g)   brown rice.  4 oz (113 g) corn.  8 oz (240 mL) milk.  8 oz (170 g) strawberries with sugar-free whipped topping. Carbohydrate calculation 1. Identify the foods that contain carbohydrates: ? Rice. ? Corn. ? Milk. ? Strawberries. 2. Calculate how many servings you have of each food: ? 2 servings rice. ? 1 serving corn. ? 1 serving milk. ? 1 serving strawberries. 3. Multiply each number of servings by 15 g: ? 2 servings rice x 15 g = 30 g. ? 1 serving corn x 15 g = 15 g. ? 1 serving milk x 15 g = 15 g. ? 1  serving strawberries x 15 g = 15 g. 4. Add together all of the amounts to find the total grams of carbohydrates eaten: ? 30 g + 15 g + 15 g + 15 g = 75 g of carbohydrates total. Summary  Carbohydrate counting is a method of keeping track of how many carbohydrates you eat.  Eating carbohydrates naturally increases the amount of sugar (glucose) in the blood.  Counting how many carbohydrates you eat helps keep your blood glucose within normal limits, which helps you manage your diabetes.  A diet and nutrition specialist (registered dietitian) can help you make a meal plan and calculate how many carbohydrates you should have at each meal and snack. This information is not intended to replace advice given to you by your health care provider. Make sure you discuss any questions you have with your health care provider. Document Revised: 06/09/2017 Document Reviewed: 04/28/2016 Elsevier Patient Education  2020 Elsevier Inc.  

## 2020-03-04 NOTE — Progress Notes (Signed)
Subjective:    Patient ID: Debra Campbell, female    DOB: 05-02-1932, 86 y.o.   MRN: [id number]   Chief Complaint: Medical Management of Chronic Issues    HPI:  1. Essential hypertension Does not check BP at home. Does watch sodium in diet. Denies chest pain, sob, headaches. BP Readings from Last 3 Encounters:  03/04/20 116/77  01/30/20 (!) 148/78  12/04/19 111/72     2. Mixed hyperlipidemia Tries to watch fat and cholesterol in diet. Does not exercise. Lab Results  Component Value Date   CHOL 194 12/04/2019   HDL 61 12/04/2019   LDLCALC 109 (H) 12/04/2019   TRIG 136 12/04/2019   CHOLHDL 3.2 12/04/2019     3. Type 2 diabetes mellitus with diabetic polyneuropathy, without long-term current use of insulin (HCC) Does check blood sugars at home. Ranges between 125-150 fasting. Has only been taking 500 mg metformin (1/2 tab) bid instead of 1000 mg bid because she was having a lot of diarrhea. Denies changes in vision. Lab Results  Component Value Date   HGBA1C 7.5 (H) 12/04/2019     4. Permanent atrial fibrillation ([place]) CHA2DS2Vasc = 5; Xarelto Sees cardiologist and last visit was 3/3. EKG at that visit showed that she was still in A-Fib.  5. Hypothyroidism due to acquired atrophy of thyroid No issues that she is aware of. Lab Results  Component Value Date   TSH 0.882 02/01/2019    6. Diabetic polyneuropathy associated with type 2 diabetes mellitus (HCC) Numbness and tingling in both feet and in her fingers. Takes gabapentin and it is helping.  7. Primary osteoarthritis involving multiple joints Increased pain in left hip and leg.  8. Osteopenia of lumbar spine Denies any falls.  9. Morbid obesity ([place]) No significant changes in weight. [name] BMI Readings from Last 3 Encounters:  03/04/20 35.53 kg/m  01/30/20 36.01 kg/m  12/04/19 34.50 kg/m   Wt Readings from Last 3 Encounters:  03/04/20 207 lb (93.9 kg)  01/30/20 209 lb 12.8 oz (95.2 kg)  12/04/19  201 lb (91.2 kg)     10. Peripheral edema Has swelling in both legs for months that had gotten better but has gotten worse recently. Is taking lasix but they are still swollen.    Outpatient Encounter Medications as of 03/04/2020  Medication Sig   acetaminophen (TYLENOL) 500 MG tablet Take 1 tablet (500 mg total) by mouth every 8 (eight) hours as needed.   gabapentin (NEURONTIN) 300 MG capsule Take 1 capsule (300 mg total) by mouth 2 (two) times daily.   glucose blood (ACCU-CHEK AVIVA PLUS) test strip Test 1x per day and prn. [id number].E11.9   levothyroxine (SYNTHROID) 175 MCG tablet TAKE 1 TABLET EVERY DAY BEFORE BREAKFAST   metFORMIN (GLUCOPHAGE) 1000 MG tablet TAKE 1 TABLET TWICE DAILY WITH A MEAL   metoprolol succinate (TOPROL-XL) 25 MG 24 hr tablet Take 1 tablet (25 mg total) by mouth daily.   nystatin cream (MYCOSTATIN) Apply 1 application topically 2 (two) times daily.   rivaroxaban (XARELTO) 20 MG TABS tablet TAKE 1 TABLET EVERY DAY WITH BREAKFAST   triamcinolone cream (KENALOG) 0.5 % APPLY 1 APPLICATION TOPICALLY THREE TIMES DAILY AS NEEDED.   TRUEPLUS LANCETS 30G MISC CHECK BLOOD SUGAR ONCE A DAY AND AS NEEDED   furosemide (LASIX) 40 MG tablet Take 2 tablets (80 mg total) by mouth 2 (two) times daily. MAY TAKE AN ADDITIONAL 40 MG IF WEIGHT GAIN OF 3 LBS OR MORE OVER NIGHT  No facility-administered encounter medications on file as of 03/04/2020.    Past Surgical History:  Procedure Laterality Date   ABDOMINAL HYSTERECTOMY Bilateral 06/26/2013   Procedure: TOTAL ABDOMINAL HYSTERECTOMY WITH BILATERAL SALPINGO OOPHERECTOMY WITH PELVIC LYMPHADNECTOMY;  Surgeon: Alvino Chapel, MD;  Location: [facility];  Service: Gynecology;  Laterality: Bilateral;   BACK SURGERY  06/26/2013   ECTOPIC PREGNANCY SURGERY     HERNIA REPAIR     HYSTEROSCOPY WITH D & C N/A 04/26/2013   Procedure: DILATATION AND CURETTAGE /HYSTEROSCOPY;  Surgeon: Woodroe Mode, MD;  Location: [facility];   Service: Gynecology;  Laterality: N/A;   JOINT REPLACEMENT     LAPAROTOMY     for ectopic pregnancy   LAPAROTOMY N/A 06/26/2013   Procedure: EXPLORATORY LAPAROTOMY;  Surgeon: Alvino Chapel, MD;  Location: [facility];  Service: Gynecology;  Laterality: N/A;   lt knee arthroscopy     rt total hip replacement     TONSILLECTOMY     TOTAL KNEE ARTHROPLASTY Left 12/22/2016   Procedure: LEFT TOTAL KNEE ARTHROPLASTY;  Surgeon: Latanya Maudlin, MD;  Location: [facility];  Service: Orthopedics;  Laterality: Left;   TRANSTHORACIC ECHOCARDIOGRAM  09/2018   EF 60-65%. Mild AS.  Mod-Severe PAH 60 mmHg   WISDOM TOOTH EXTRACTION      Family History  Problem Relation Age of Onset   Heart disease Father    Thyroid disease Father    Heart attack Father    Heart disease Sister        open heart surgery   Cancer Sister        breast   Neuropathy Sister        secondary to cancer treatment   Breast cancer Sister    Suicidality Brother 81   Arthritis Mother    Cancer Daughter    Breast cancer Daughter     New complaints: Having pain in left leg/hip and across lower back. Went to see orthopedic doctor 3/3 who did an X-ray and said it was inflamed. He gave her a steroid dose pack which she has finished but it has not helped.  Social history: Lives with husband of 13 years. Niece helps her.   Controlled substance contract: N/A     Review of Systems  Constitutional: Negative.   HENT: Negative.   Eyes: Negative.   Respiratory: Negative.   Cardiovascular: Positive for leg swelling.  Gastrointestinal: Negative.   Endocrine: Negative.   Genitourinary: Positive for frequency and urgency.  Musculoskeletal: Positive for arthralgias (left hip and leg).  Skin: Positive for color change (erythema on both legs).  Neurological: Positive for numbness (and tingling in extremities).  Psychiatric/Behavioral: Negative.        Objective:   Physical Exam Vitals and nursing note  reviewed.  Constitutional:      Appearance: Normal appearance.  HENT:     Head: Normocephalic.     Right Ear: Tympanic membrane normal.     Left Ear: Tympanic membrane normal.     Nose: Nose normal.     Mouth/Throat:     Mouth: Mucous membranes are moist.     Pharynx: Oropharynx is clear.  Eyes:     Conjunctiva/sclera: Conjunctivae normal.     Pupils: Pupils are equal, round, and reactive to light.  Cardiovascular:     Rate and Rhythm: Normal rate. Rhythm irregular.     Pulses: Normal pulses.     Heart sounds: Normal heart sounds.  Pulmonary:     Effort: Pulmonary effort  is normal.     Breath sounds: Normal breath sounds.  Abdominal:     General: Bowel sounds are normal.     Palpations: Abdomen is soft.  Musculoskeletal:     Cervical back: Normal range of motion and neck supple.     Right lower leg: 3+ Edema present.     Left lower leg: 4+ Edema present.  Skin:    Capillary Refill: Capillary refill takes less than 2 seconds.     Findings: Erythema (bilateral legs) present.  Neurological:     Mental Status: She is alert and oriented to person, place, and time.  Psychiatric:        Behavior: Behavior normal.     BP 116/77    Pulse 84    Temp 98.4 F (36.9 C) (Temporal)    Resp 20    Ht 5' 4"  (1.626 m)    Wt 207 lb (93.9 kg)    SpO2 96%    BMI 35.53 kg/m       Assessment & Plan:  Debra Campbell comes in today with chief complaint of Medical Management of Chronic Issues   Diagnosis and orders addressed:   1. Essential hypertension Low sodium diet. - CBC with Differential/Platelet - CMP14+EGFR  2. Mixed hyperlipidemia Low fat diet. - Lipid panel  3. Type 2 diabetes mellitus with diabetic polyneuropathy, without long-term current use of insulin (HCC) Check blood sugars at home. Stricter carb counting. Will continue metformin at 566m bid ADDED GLIPIZIDE Carb counting discussed Recheck hgba1c in 1 month to make sure trending downward - Bayer DCA Hb A1c  Waived - metFORMIN (GLUCOPHAGE) 500 MG tablet; Take 1 tablet (500 mg total) by mouth 2 (two) times daily with a meal.  Dispense: 180 tablet; Refill: 1 - glipiZIDE (GLUCOTROL) 5 MG tablet; Take 0.5 tablets (2.5 mg total) by mouth 2 (two) times daily before a meal.  Dispense: 180 tablet; Refill: 1  4. Permanent atrial fibrillation (HCC) CHA2DS2Vasc = 5; Xarelto - rivaroxaban (XARELTO) 20 MG TABS tablet; TAKE 1 TABLET EVERY DAY WITH BREAKFAST  Dispense: 90 tablet; Refill: 1  5. Hypothyroidism due to acquired atrophy of thyroid - levothyroxine (SYNTHROID) 175 MCG tablet; TAKE 1 TABLET EVERY DAY BEFORE BREAKFAST  Dispense: 90 tablet; Refill: 1  6. Diabetic polyneuropathy associated with type 2 diabetes mellitus (HCC) Keep legs warm at night. - gabapentin (NEURONTIN) 300 MG capsule; Take 1 capsule (300 mg total) by mouth 2 (two) times daily.  Dispense: 180 capsule; Refill: 1  7. Primary osteoarthritis involving multiple joints  8. Osteopenia of lumbar spine  9. [place] Discussed diet and exercise for person with BMI >25 Will recheck weight in 3-6 months  10. Peripheral edema Elevate legs when sitting. - furosemide (LASIX) 40 MG tablet; Take 2 tablets (80 mg total) by mouth 2 (two) times daily. MAY TAKE AN ADDITIONAL 40 MG IF WEIGHT GAIN OF 3 LBS OR MORE OVER NIGHT  Dispense: 360 tablet; Refill: 1  11. Permanent atrial fibrillation (HCC) - rivaroxaban (XARELTO) 20 MG TABS tablet; TAKE 1 TABLET EVERY DAY WITH BREAKFAST  Dispense: 90 tablet; Refill: 1  12. Hypertension associated with type 2 diabetes mellitus (HCC) - metoprolol succinate (TOPROL-XL) 25 MG 24 hr tablet; Take 1 tablet (25 mg total) by mouth daily.  Dispense: 90 tablet; Refill: 1   Labs pending Health Maintenance reviewed Diet and exercise encouraged  Follow up plan: 1 month   [place] FNP

## 2020-03-05 LAB — CBC WITH DIFFERENTIAL/PLATELET
Basophils Absolute: 0.1 10*3/uL (ref 0.0–0.2)
Basos: 1 %
EOS (ABSOLUTE): 0.2 10*3/uL (ref 0.0–0.4)
Eos: 3 %
Hematocrit: 40.6 % (ref 34.0–46.6)
Hemoglobin: 14 g/dL (ref 11.1–15.9)
Immature Grans (Abs): 0 10*3/uL (ref 0.0–0.1)
Immature Granulocytes: 0 %
Lymphocytes Absolute: 1.9 10*3/uL (ref 0.7–3.1)
Lymphs: 34 %
MCH: 31.9 pg (ref 26.6–33.0)
MCHC: 34.5 g/dL (ref 31.5–35.7)
MCV: 93 fL (ref 79–97)
Monocytes Absolute: 0.5 10*3/uL (ref 0.1–0.9)
Monocytes: 9 %
Neutrophils Absolute: 3 10*3/uL (ref 1.4–7.0)
Neutrophils: 53 %
Platelets: 173 10*3/uL (ref 150–450)
RBC: 4.39 x10E6/uL (ref 3.77–5.28)
RDW: 13.3 % (ref 11.7–15.4)
WBC: 5.6 10*3/uL (ref 3.4–10.8)

## 2020-03-05 LAB — CMP14+EGFR
ALT: 15 IU/L (ref 0–32)
AST: 19 IU/L (ref 0–40)
Albumin/Globulin Ratio: 1.4 (ref 1.2–2.2)
Albumin: 3.8 g/dL (ref 3.6–4.6)
Alkaline Phosphatase: 61 IU/L (ref 39–117)
BUN/Creatinine Ratio: 13 (ref 12–28)
BUN: 13 mg/dL (ref 8–27)
Bilirubin Total: 0.8 mg/dL (ref 0.0–1.2)
CO2: 25 mmol/L (ref 20–29)
Calcium: 9.2 mg/dL (ref 8.7–10.3)
Chloride: 101 mmol/L (ref 96–106)
Creatinine, Ser: 0.99 mg/dL (ref 0.57–1.00)
GFR calc Af Amer: 60 mL/min/{1.73_m2} (ref >59)
GFR calc non Af Amer: 52 mL/min/{1.73_m2} — ABNORMAL LOW (ref >59)
Globulin, Total: 2.7 g/dL (ref 1.5–4.5)
Glucose: 229 mg/dL — ABNORMAL HIGH (ref 65–99)
Potassium: 4.2 mmol/L (ref 3.5–5.2)
Sodium: 140 mmol/L (ref 134–144)
Total Protein: 6.5 g/dL (ref 6.0–8.5)

## 2020-03-05 LAB — LIPID PANEL
Chol/HDL Ratio: 3.5 ratio (ref 0.0–4.4)
Cholesterol, Total: 186 mg/dL (ref 100–199)
HDL: 53 mg/dL (ref >39)
LDL Chol Calc (NIH): 102 mg/dL — ABNORMAL HIGH (ref 0–99)
Triglycerides: 177 mg/dL — ABNORMAL HIGH (ref 0–149)
VLDL Cholesterol Cal: 31 mg/dL (ref 5–40)

## 2020-04-08 ENCOUNTER — Ambulatory Visit (INDEPENDENT_AMBULATORY_CARE_PROVIDER_SITE_OTHER): Payer: Medicare Other | Admitting: Nurse Practitioner

## 2020-04-08 ENCOUNTER — Encounter: Payer: Self-pay | Admitting: Nurse Practitioner

## 2020-04-08 ENCOUNTER — Other Ambulatory Visit: Payer: Self-pay

## 2020-04-08 VITALS — BP 117/69 | HR 78 | Temp 97.4°F | Resp 20 | Ht 64.0 in | Wt 198.0 lb

## 2020-04-08 DIAGNOSIS — E1142 Type 2 diabetes mellitus with diabetic polyneuropathy: Secondary | ICD-10-CM

## 2020-04-08 LAB — BAYER DCA HB A1C WAIVED: HB A1C (BAYER DCA - WAIVED): 6.9 % (ref <7.0)

## 2020-04-08 NOTE — Progress Notes (Signed)
   Subjective:    Patient ID: Debra Campbell, female    DOB: 1932/05/04, 86 y.o.   MRN: [id number]   Chief Complaint: Recheck diabetes (Refill gabapentin)   HPI Patient was seen for follow up on 03/04/20. She had not been watching diet nor checking hr blood sugars daily. Her hgba1c was 9.8%. we added glipizide to metformin and he was told to watch carbs in diet. Blood sugars have been running in 90's. She had a low of 73 2 morning. She has really been watching hr diet.     Review of Systems  Constitutional: Negative for diaphoresis.  Eyes: Negative for pain.  Respiratory: Negative for shortness of breath.   Cardiovascular: Negative for chest pain, palpitations and leg swelling.  Gastrointestinal: Negative for abdominal pain.  Endocrine: Negative for polydipsia.  Skin: Negative for rash.  Neurological: Negative for dizziness, weakness and headaches.  Hematological: Does not bruise/bleed easily.  All other systems reviewed and are negative.      Objective:   Physical Exam Vitals and nursing note reviewed.  Constitutional:      Appearance: Normal appearance.  Cardiovascular:     Rate and Rhythm: Normal rate and regular rhythm.     Heart sounds: Normal heart sounds.  Pulmonary:     Breath sounds: Normal breath sounds.  Skin:    General: Skin is warm.  Neurological:     General: No focal deficit present.     Mental Status: She is alert and oriented to person, place, and time.  Psychiatric:        Mood and Affect: Mood normal.        Behavior: Behavior normal.    Blood pressure 117/69, pulse 78, temperature (!) 97.4 F (36.3 C), temperature source Temporal, resp. rate 20, height 5\' 4"  (1.626 m), weight 198 lb (89.8 kg), SpO2 98 %.   HGBA1c 6.9%      Assessment & Plan:  Debra Campbell in today with chief complaint of Recheck diabetes (Refill gabapentin)   1. Type 2 diabetes mellitus with diabetic polyneuropathy, without long-term current use of insulin (HCC) Stop  glipizide Continue metformin BID Continue low carb diet - Bayer DCA Hb A1c Waived Follow up in 3 month   The above assessment and management plan was discussed with the patient. The patient verbalized understanding of and has agreed to the management plan. Patient is aware to call the clinic if symptoms persist or worsen. Patient is aware when to return to the clinic for a follow-up visit. Patient educated on when it is appropriate to go to the emergency department.   Debra Campbell Done, FNP

## 2020-04-18 ENCOUNTER — Ambulatory Visit: Payer: Medicare Other | Admitting: Nurse Practitioner

## 2020-05-16 ENCOUNTER — Encounter: Payer: Self-pay | Admitting: Family

## 2020-05-16 ENCOUNTER — Other Ambulatory Visit: Payer: Self-pay

## 2020-05-16 ENCOUNTER — Ambulatory Visit (INDEPENDENT_AMBULATORY_CARE_PROVIDER_SITE_OTHER): Payer: Medicare Other | Admitting: Family

## 2020-05-16 VITALS — BP 122/67 | HR 84 | Temp 97.8°F | Ht 64.0 in | Wt 188.0 lb

## 2020-05-16 DIAGNOSIS — G5602 Carpal tunnel syndrome, left upper limb: Secondary | ICD-10-CM | POA: Diagnosis not present

## 2020-05-16 DIAGNOSIS — M7712 Lateral epicondylitis, left elbow: Secondary | ICD-10-CM

## 2020-05-16 MED ORDER — PREDNISONE 10 MG (21) PO TBPK: ORAL_TABLET | ORAL | 0 refills | Status: DC

## 2020-05-16 NOTE — Progress Notes (Addendum)
   Subjective:    Patient ID: Debra Campbell, female    DOB: 07/14/1932, 86 y.o.   MRN: [id number]  Chief Complaint  Patient presents with  . Arm Pain    left arm pain x4 weeks getting worse no injury    PT presents to the office today with left arm, wrist, and hand pain. She reports intermittent shooting pain of 10 out 10. She states the pain will wake her up at night.  Arm Pain  The incident occurred more than 1 week ago. There was no injury mechanism. The pain is present in the left shoulder and left forearm. The quality of the pain is described as aching. The pain does not radiate. The pain is at a severity of 10/10. The pain is moderate. The pain has been intermittent since the incident. Associated symptoms include numbness and tingling. Pertinent negatives include no chest pain or muscle weakness. Nothing aggravates the symptoms. She has tried acetaminophen for the symptoms. The treatment provided mild relief.      Review of Systems  Cardiovascular: Negative for chest pain.  Neurological: Positive for tingling and numbness.  All other systems reviewed and are negative.      Objective:   Physical Exam Vitals reviewed.  Constitutional:      General: She is not in acute distress.    Appearance: She is well-developed.  HENT:     Head: Normocephalic and atraumatic.  Eyes:     Pupils: Pupils are equal, round, and reactive to light.  Neck:     Thyroid: No thyromegaly.  Cardiovascular:     Rate and Rhythm: Normal rate and regular rhythm.     Heart sounds: Normal heart sounds. No murmur heard.   Pulmonary:     Effort: Pulmonary effort is normal. No respiratory distress.     Breath sounds: Normal breath sounds. No wheezing.  Abdominal:     General: Bowel sounds are normal. There is no distension.     Palpations: Abdomen is soft.     Tenderness: There is no abdominal tenderness.  Musculoskeletal:        General: No tenderness. Normal range of motion.     Cervical back:  Normal range of motion and neck supple.     Comments: Positive phalen  Skin:    General: Skin is warm and dry.  Neurological:     Mental Status: She is alert and oriented to person, place, and time.     Cranial Nerves: No cranial nerve deficit.     Deep Tendon Reflexes: Reflexes are normal and symmetric.  Psychiatric:        Behavior: Behavior normal.        Thought Content: Thought content normal.        Judgment: Judgment normal.      BP 122/67   Pulse 84   Temp 97.8 F (36.6 C) (Temporal)   Ht 5\' 4"  (1.626 m)   Wt 188 lb (85.3 kg)   SpO2 97%   BMI 32.27 kg/m       Assessment & Plan:  1. Carpal tunnel syndrome of left wrist Wear wrist splint Can not have NSAID's given she takes Xarelto Keep follow up with PCP - predniSONE (STERAPRED UNI-PAK 21 TAB) 10 MG (21) TBPK tablet; Use as directed  Dispense: 21 tablet; Refill: 0  2. Lateral epicondylitis of left elbow Rest Ice Prednisone   Evelina Dun, FNP

## 2020-05-16 NOTE — Patient Instructions (Signed)
Carpal Tunnel Syndrome  Carpal tunnel syndrome is a condition that causes pain in your hand and arm. The carpal tunnel is a narrow area located on the palm side of your wrist. Repeated wrist motion or certain diseases may cause swelling within the tunnel. This swelling pinches the main nerve in the wrist (median nerve). What are the causes? This condition may be caused by:  Repeated wrist motions.  Wrist injuries.  Arthritis.  A cyst or tumor in the carpal tunnel.  Fluid buildup during pregnancy. Sometimes the cause of this condition is not known. What increases the risk? The following factors may make you more likely to develop this condition:  Having a job, such as being a butcher or a cashier, that requires you to repeatedly move your wrist in the same motion.  Being a woman.  Having certain conditions, such as: ? Diabetes. ? Obesity. ? An underactive thyroid (hypothyroidism). ? Kidney failure. What are the signs or symptoms? Symptoms of this condition include:  A tingling feeling in your fingers, especially in your thumb, index, and middle fingers.  Tingling or numbness in your hand.  An aching feeling in your entire arm, especially when your wrist and elbow are bent for a long time.  Wrist pain that goes up your arm to your shoulder.  Pain that goes down into your palm or fingers.  A weak feeling in your hands. You may have trouble grabbing and holding items. Your symptoms may feel worse during the night. How is this diagnosed? This condition is diagnosed with a medical history and physical exam. You may also have tests, including:  Electromyogram (EMG). This test measures electrical signals sent by your nerves into the muscles.  Nerve conduction study. This test measures how well electrical signals pass through your nerves.  Imaging tests, such as X-rays, ultrasound, and MRI. These tests check for possible causes of your condition. How is this treated? This  condition may be treated with:  Lifestyle changes. It is important to stop or change the activity that caused your condition.  Doing exercise and activities to strengthen your muscles and bones (physical therapy).  Learning how to use your hand again after diagnosis (occupational therapy).  Medicines for pain and inflammation. This may include medicine that is injected into your wrist.  A wrist splint.  Surgery. Follow these instructions at home: If you have a splint:  Wear the splint as told by your health care provider. Remove it only as told by your health care provider.  Loosen the splint if your fingers tingle, become numb, or turn cold and blue.  Keep the splint clean.  If the splint is not waterproof: ? Do not let it get wet. ? Cover it with a watertight covering when you take a bath or shower. Managing pain, stiffness, and swelling   If directed, put ice on the painful area: ? If you have a removable splint, remove it as told by your health care provider. ? Put ice in a plastic bag. ? Place a towel between your skin and the bag. ? Leave the ice on for 20 minutes, 2-3 times per day. General instructions  Take over-the-counter and prescription medicines only as told by your health care provider.  Rest your wrist from any activity that may be causing your pain. If your condition is work related, talk with your employer about changes that can be made, such as getting a wrist pad to use while typing.  Do any exercises as told   by your health care provider, physical therapist, or occupational therapist.  Keep all follow-up visits as told by your health care provider. This is important. Contact a health care provider if:  You have new symptoms.  Your pain is not controlled with medicines.  Your symptoms get worse. Get help right away if:  You have severe numbness or tingling in your wrist or hand. Summary  Carpal tunnel syndrome is a condition that causes pain in  your hand and arm.  It is usually caused by repeated wrist motions.  Lifestyle changes and medicines are used to treat carpal tunnel syndrome. Surgery may be recommended.  Follow your health care provider's instructions about wearing a splint, resting from activity, keeping follow-up visits, and calling for help. This information is not intended to replace advice given to you by your health care provider. Make sure you discuss any questions you have with your health care provider. Document Revised: 03/24/2018 Document Reviewed: 03/24/2018 Elsevier Patient Education  2020 [employer]  

## 2020-05-27 ENCOUNTER — Ambulatory Visit (INDEPENDENT_AMBULATORY_CARE_PROVIDER_SITE_OTHER): Payer: Medicare Other | Admitting: Family

## 2020-05-27 ENCOUNTER — Other Ambulatory Visit: Payer: Self-pay

## 2020-05-27 ENCOUNTER — Encounter: Payer: Self-pay | Admitting: Family

## 2020-05-27 VITALS — BP 130/60 | HR 84 | Temp 97.3°F | Ht 64.0 in | Wt 185.8 lb

## 2020-05-27 DIAGNOSIS — M7712 Lateral epicondylitis, left elbow: Secondary | ICD-10-CM | POA: Diagnosis not present

## 2020-05-27 NOTE — Patient Instructions (Signed)
Tennis Elbow    Tennis elbow (lateral epicondylitis) is inflammation of tendons in your outer forearm, near your elbow. Tendons are tissues that connect muscle to bone. When you have tennis elbow, inflammation affects the tendons that you use to bend your wrist and move your hand up. Inflammation occurs in the lower part of the upper arm bone (humerus), where the tendons connect to the bone (lateral epicondyle).  Tennis elbow often affects people who play tennis, but anyone may get the condition from repeatedly extending the wrist or turning the forearm.  What are the causes?  This condition is usually caused by repeatedly extending the wrist, turning the forearm, and using the hands. It can result from sports or work that requires repetitive forearm movements. In some cases, it may be caused by a sudden injury.  What increases the risk?  You are more likely to develop tennis elbow if you play tennis or another racket sport. You also have a higher risk if you frequently use your hands for work. Besides people who play tennis, others at greater risk include:  Musicians.  Carpenters, painters, and plumbers.  Cooks.  Cashiers.  People who work in factories.  Construction workers.  Butchers.  People who use computers.  What are the signs or symptoms?  Symptoms of this condition include:  Pain and tenderness in the forearm and the outer part of the elbow. Pain may be felt only when using the arm, or it may be there all the time.  A burning feeling that starts in the elbow and spreads down the forearm.  A weak grip in the hand.  How is this diagnosed?  This condition may be diagnosed based on:  Your symptoms and medical history.  A physical exam.  X-rays.  MRI.  How is this treated?  Resting and icing your arm is often the first treatment. Your health care provider may also recommend:  Medicines to reduce pain and inflammation. These may be in the form of a pill, topical gels, or shots of a steroid medicine  (cortisone).  An elbow strap to reduce stress on the area.  Physical therapy. This may include massage or exercises.  An elbow brace to restrict the movements that cause symptoms.  If these treatments do not help relieve your symptoms, your health care provider may recommend surgery to remove damaged muscle and reattach healthy muscle to bone.  Follow these instructions at home:  Activity  Rest your elbow and wrist and avoid activities that cause symptoms, as told by your health care provider.  Do physical therapy exercises as instructed.  If you lift an object, lift it with your palm facing up. This reduces stress on your elbow.  Lifestyle  If your tennis elbow is caused by sports, check your equipment and make sure that:  You are using it correctly.  It is the best fit for you.  If your tennis elbow is caused by work or computer use, take frequent breaks to stretch your arm. Talk with your manager about ways to manage your condition at work.  If you have a brace:  Wear the brace or strap as told by your health care provider. Remove it only as told by your health care provider.  Loosen the brace if your fingers tingle, become numb, or turn cold and blue.  Keep the brace clean.  If the brace is not waterproof, ask if you may remove it for bathing. If you must keep the brace on while bathing:    bath or a shower. General instructions   If directed, put ice on the painful area: ? Put ice in a plastic bag. ? Place a towel between your skin and the bag. ? Leave the ice on for 20 minutes, 2-3 times a day.  Take over-the-counter and prescription medicines only as told by your health care provider.  Keep all follow-up visits as told by your health care provider. This is important. Contact a health care provider if:  You have pain that gets worse or does not get better with  treatment.  You have numbness or weakness in your forearm, hand, or fingers. Summary  Tennis elbow (lateral epicondylitis) is inflammation of tendons in your outer forearm, near your elbow.  Common symptoms include pain and tenderness in your forearm and the outer part of your elbow.  This condition is usually caused by repeatedly extending your wrist, turning your forearm, and using your hands.  The first treatment is often resting and icing your arm to relieve symptoms. Further treatment may include taking medicine, getting physical therapy, wearing a brace or strap, or having surgery. This information is not intended to replace advice given to you by your health care provider. Make sure you discuss any questions you have with your health care provider. Document Revised: 08/11/2018 Document Reviewed: 08/30/2017 Elsevier Patient Education  2020 [employer]  

## 2020-05-27 NOTE — Progress Notes (Signed)
° °  Subjective:    Patient ID: Debra Campbell, female    DOB: [birth year], 86 y.o.   MRN: [id number]  Chief Complaint  Patient presents with   Arm Pain    left     Arm Pain  The incident occurred more than 1 week ago. There was no injury mechanism. The pain is present in the left elbow. The quality of the pain is described as aching. The pain is at a severity of 10/10. The pain is moderate. The pain has been intermittent since the incident. Associated symptoms include tingling. Pertinent negatives include no numbness. The symptoms are aggravated by movement. She has tried NSAIDs (prednisone) for the symptoms. The treatment provided mild relief.      Review of Systems  Neurological: Positive for tingling. Negative for numbness.  All other systems reviewed and are negative.      Objective:   Physical Exam Vitals reviewed.  Constitutional:      General: She is not in acute distress.    Appearance: She is well-developed.  HENT:     Head: Normocephalic and atraumatic.  Eyes:     Pupils: Pupils are equal, round, and reactive to light.  Neck:     Thyroid: No thyromegaly.  Cardiovascular:     Rate and Rhythm: Normal rate and regular rhythm.     Heart sounds: Normal heart sounds. No murmur heard.   Pulmonary:     Effort: Pulmonary effort is normal. No respiratory distress.     Breath sounds: Normal breath sounds. No wheezing.  Abdominal:     General: Bowel sounds are normal. There is no distension.     Palpations: Abdomen is soft.     Tenderness: There is no abdominal tenderness.  Musculoskeletal:        General: Tenderness present.     Cervical back: Normal range of motion and neck supple.     Comments: Pain in left lateral elbow tenderness and pain  Skin:    General: Skin is warm and dry.  Neurological:     Mental Status: She is alert and oriented to person, place, and time.     Cranial Nerves: No cranial nerve deficit.     Deep Tendon Reflexes: Reflexes are normal and  symmetric.  Psychiatric:        Behavior: Behavior normal.        Thought Content: Thought content normal.        Judgment: Judgment normal.     BP 130/60    Pulse 84    Temp (!) 97.3 F (36.3 C) (Temporal)    Ht 5\' 4"  (1.626 m)    Wt 185 lb 12.8 oz (84.3 kg)    SpO2 98%    BMI 31.89 kg/m        Assessment & Plan:  1. Lateral epicondylitis of left elbow No NSAID's since she is on xarelto She states the prednisone did not help Will do referral so she can get an injection.  PT referral placed Shoulder sling placed today, rest Avoid applying pressure on walker Pt has Norco rx at home she will take as needed for pain - Ambulatory referral to Orthopedic Surgery - Ambulatory referral to Physical Therapy

## 2020-06-03 ENCOUNTER — Telehealth: Payer: Self-pay | Admitting: Nurse Practitioner

## 2020-06-03 NOTE — Telephone Encounter (Signed)
.   REFERRAL REQUEST Telephone Note 06/03/2020  What type of referral do you need? Ortho.  Why do you need this referral? Arm pain  Have you been seen at our office for this problem? yes (Advise that they may need an appointment with their PCP before a referral can be done)  Is there a particular doctor or location that you prefer? [facility], pt is in pain   Patient notified that referrals can take up to a week or longer to process. If they haven't heard anything within a week they should call back and speak with the referral department.

## 2020-06-03 NOTE — Telephone Encounter (Signed)
Referral has been placed and sent to [facility]

## 2020-06-04 ENCOUNTER — Other Ambulatory Visit: Payer: Self-pay | Admitting: Nurse Practitioner

## 2020-06-09 ENCOUNTER — Ambulatory Visit: Payer: Medicare Other | Admitting: Physical Therapy

## 2020-06-09 ENCOUNTER — Other Ambulatory Visit: Payer: Self-pay

## 2020-06-09 MED ORDER — ACCU-CHEK GUIDE VI STRP: ORAL_STRIP | 3 refills | Status: DC

## 2020-06-26 DIAGNOSIS — M25512 Pain in left shoulder: Secondary | ICD-10-CM | POA: Diagnosis not present

## 2020-06-26 DIAGNOSIS — M542 Cervicalgia: Secondary | ICD-10-CM | POA: Diagnosis not present

## 2020-07-09 ENCOUNTER — Ambulatory Visit (INDEPENDENT_AMBULATORY_CARE_PROVIDER_SITE_OTHER): Payer: Medicare Other | Admitting: Nurse Practitioner

## 2020-07-09 ENCOUNTER — Other Ambulatory Visit: Payer: Self-pay

## 2020-07-09 ENCOUNTER — Encounter: Payer: Self-pay | Admitting: Nurse Practitioner

## 2020-07-09 VITALS — BP 111/69 | HR 73 | Temp 97.6°F | Resp 20 | Ht 64.0 in | Wt 181.0 lb

## 2020-07-09 DIAGNOSIS — E034 Atrophy of thyroid (acquired): Secondary | ICD-10-CM | POA: Diagnosis not present

## 2020-07-09 DIAGNOSIS — I10 Essential (primary) hypertension: Secondary | ICD-10-CM | POA: Diagnosis not present

## 2020-07-09 DIAGNOSIS — E1159 Type 2 diabetes mellitus with other circulatory complications: Secondary | ICD-10-CM | POA: Diagnosis not present

## 2020-07-09 DIAGNOSIS — E1142 Type 2 diabetes mellitus with diabetic polyneuropathy: Secondary | ICD-10-CM | POA: Diagnosis not present

## 2020-07-09 DIAGNOSIS — I4821 Permanent atrial fibrillation: Secondary | ICD-10-CM | POA: Diagnosis not present

## 2020-07-09 DIAGNOSIS — R609 Edema, unspecified: Secondary | ICD-10-CM

## 2020-07-09 DIAGNOSIS — E782 Mixed hyperlipidemia: Secondary | ICD-10-CM | POA: Diagnosis not present

## 2020-07-09 DIAGNOSIS — I152 Hypertension secondary to endocrine disorders: Secondary | ICD-10-CM

## 2020-07-09 LAB — BAYER DCA HB A1C WAIVED: HB A1C (BAYER DCA - WAIVED): 6.2 % (ref <7.0)

## 2020-07-09 MED ORDER — LEVOTHYROXINE SODIUM 175 MCG PO TABS: ORAL_TABLET | ORAL | 1 refills | Status: DC

## 2020-07-09 MED ORDER — RIVAROXABAN 20 MG PO TABS: ORAL_TABLET | ORAL | 1 refills | Status: DC

## 2020-07-09 MED ORDER — GABAPENTIN 300 MG PO CAPS: 300.0000 mg | ORAL_CAPSULE | Freq: Two times a day (BID) | ORAL | 1 refills | Status: DC

## 2020-07-09 MED ORDER — METOPROLOL SUCCINATE ER 25 MG PO TB24: 25.0000 mg | ORAL_TABLET | Freq: Every day | ORAL | 1 refills | Status: DC

## 2020-07-09 MED ORDER — GLIPIZIDE 5 MG PO TABS: 2.5000 mg | ORAL_TABLET | Freq: Two times a day (BID) | ORAL | 1 refills | Status: DC

## 2020-07-09 MED ORDER — METFORMIN HCL 500 MG PO TABS: 500.0000 mg | ORAL_TABLET | Freq: Two times a day (BID) | ORAL | 1 refills | Status: DC

## 2020-07-09 MED ORDER — FUROSEMIDE 40 MG PO TABS: 80.0000 mg | ORAL_TABLET | Freq: Two times a day (BID) | ORAL | 1 refills | Status: DC

## 2020-07-09 NOTE — Patient Instructions (Signed)
Peripheral Edema  Peripheral edema is swelling that is caused by a buildup of fluid. Peripheral edema most often affects the lower legs, ankles, and feet. It can also develop in the arms, hands, and face. The area of the body that has peripheral edema will look swollen. It may also feel heavy or warm. Your clothes may start to feel tight. Pressing on the area may make a temporary dent in your skin. You may not be able to move your swollen arm or leg as much as usual. There are many causes of peripheral edema. It can happen because of a complication of other conditions such as congestive heart failure, kidney disease, or a problem with your blood circulation. It also can be a side effect of certain medicines or because of an infection. It often happens to women during pregnancy. Sometimes, the cause is not known. Follow these instructions at home: Managing pain, stiffness, and swelling   Raise (elevate) your legs while you are sitting or lying down.  Move around often to prevent stiffness and to lessen swelling.  Do not sit or stand for long periods of time.  Wear support stockings as told by your health care provider. Medicines  Take over-the-counter and prescription medicines only as told by your health care provider.  Your health care provider may prescribe medicine to help your body get rid of excess water (diuretic). General instructions  Pay attention to any changes in your symptoms.  Follow instructions from your health care provider about limiting salt (sodium) in your diet. Sometimes, eating less salt may reduce swelling.  Moisturize skin daily to help prevent skin from cracking and draining.  Keep all follow-up visits as told by your health care provider. This is important. Contact a health care provider if you have:  A fever.  Edema that starts suddenly or is getting worse, especially if you are pregnant or have a medical condition.  Swelling in only one leg.  Increased  swelling, redness, or pain in one or both of your legs.  Drainage or sores at the area where you have edema. Get help right away if you:  Develop shortness of breath, especially when you are lying down.  Have pain in your chest or abdomen.  Feel weak.  Feel faint. Summary  Peripheral edema is swelling that is caused by a buildup of fluid. Peripheral edema most often affects the lower legs, ankles, and feet.  Move around often to prevent stiffness and to lessen swelling. Do not sit or stand for long periods of time.  Pay attention to any changes in your symptoms.  Contact a health care provider if you have edema that starts suddenly or is getting worse, especially if you are pregnant or have a medical condition.  Get help right away if you develop shortness of breath, especially when lying down. This information is not intended to replace advice given to you by your health care provider. Make sure you discuss any questions you have with your health care provider. Document Revised: 08/09/2018 Document Reviewed: 08/09/2018 Elsevier Patient Education  2020 [employer]  

## 2020-07-09 NOTE — Progress Notes (Signed)
Subjective:    Patient ID: Debra Campbell, female    DOB: 07/27/1932, 86 y.o.   MRN: [id number]   Chief Complaint: Medical Management of Chronic Issues    HPI:  1. Type 2 diabetes mellitus with diabetic polyneuropathy, without long-term current use of insulin (HCC) Fasting blood sugars running around 110-120. Was up some while she was on prednisone. Denies nay low blood sugars. Lab Results  Component Value Date   HGBA1C 6.9 04/08/2020     2. Essential hypertension No c/o chest pain, sb or headache. Does not check blood pressure at home. BP Readings from Last 3 Encounters:  07/09/20 111/69  05/27/20 130/60  05/16/20 122/67     3. Mixed hyperlipidemia Does  watch diet and is not able of doing much exercise. Lab Results  Component Value Date   CHOL 186 03/04/2020   HDL 53 03/04/2020   LDLCALC 102 (H) 03/04/2020   TRIG 177 (H) 03/04/2020   CHOLHDL 3.5 03/04/2020     4. Permanent atrial fibrillation ([place]) CHA2DS2Vasc = 5; Xarelto denies any palpitations or heart racing  5. Diabetic polyneuropathy associated with type 2 diabetes mellitus (HCC) Some numbness and tingling in bil feet.  6. Hypothyroidism due to acquired atrophy of thyroid No problems that aware of. Lab Results  Component Value Date   TSH 0.882 02/01/2019     7. Peripheral edema Has edema in lower ext daily.  8. Morbid obesity ([place]) Weight is down 4lbs since last visit Wt Readings from Last 3 Encounters:  07/09/20 181 lb (82.1 kg)  05/27/20 185 lb 12.8 oz (84.3 kg)  05/16/20 188 lb (85.3 kg)   BMI Readings from Last 3 Encounters:  07/09/20 31.07 kg/m  05/27/20 31.89 kg/m  05/16/20 32.27 kg/m       Outpatient Encounter Medications as of 07/09/2020  Medication Sig  . acetaminophen (TYLENOL) 500 MG tablet Take 1 tablet (500 mg total) by mouth every 8 (eight) hours as needed.  . blood glucose meter kit and supplies Dispense based on patient and insurance preference. Use up to four  times daily as directed. (FOR ICD-10 E10.9, E11.9).  [name] gabapentin (NEURONTIN) 300 MG capsule Take 1 capsule (300 mg total) by mouth 2 (two) times daily.  [name] glipiZIDE (GLUCOTROL) 5 MG tablet Take 0.5 tablets (2.5 mg total) by mouth 2 (two) times daily before a meal.  . glucose blood (ACCU-CHEK GUIDE) test strip Check BS once daily Dx E11.42  . levothyroxine (SYNTHROID) 175 MCG tablet TAKE 1 TABLET EVERY DAY BEFORE BREAKFAST  . metFORMIN (GLUCOPHAGE) 500 MG tablet Take 1 tablet (500 mg total) by mouth 2 (two) times daily with a meal.  . metoprolol succinate (TOPROL-XL) 25 MG 24 hr tablet Take 1 tablet (25 mg total) by mouth daily.  [name] nystatin cream (MYCOSTATIN) Apply 1 application topically 2 (two) times daily.  . rivaroxaban (XARELTO) 20 MG TABS tablet TAKE 1 TABLET EVERY DAY WITH BREAKFAST  . triamcinolone cream (KENALOG) 0.5 % APPLY 1 APPLICATION TOPICALLY THREE TIMES DAILY AS NEEDED.      . furosemide (LASIX) 40 MG tablet Take 2 tablets (80 mg total) by mouth 2 (two) times daily. MAY TAKE AN ADDITIONAL 40 MG IF WEIGHT GAIN OF 3 LBS OR MORE OVER NIGHT    Past Surgical History:  Procedure Laterality Date  . ABDOMINAL HYSTERECTOMY Bilateral 06/26/2013   Procedure: TOTAL ABDOMINAL HYSTERECTOMY WITH BILATERAL SALPINGO OOPHERECTOMY WITH PELVIC LYMPHADNECTOMY;  Surgeon: Alvino Chapel, MD;  Location: [facility];  Service:  Gynecology;  Laterality: Bilateral;  . BACK SURGERY  06/26/2013  . ECTOPIC PREGNANCY SURGERY    . HERNIA REPAIR    . HYSTEROSCOPY WITH D & C N/A 04/26/2013   Procedure: DILATATION AND CURETTAGE /HYSTEROSCOPY;  Surgeon: Woodroe Mode, MD;  Location: [facility];  Service: Gynecology;  Laterality: N/A;  . JOINT REPLACEMENT    . LAPAROTOMY     for ectopic pregnancy  . LAPAROTOMY N/A 06/26/2013   Procedure: EXPLORATORY LAPAROTOMY;  Surgeon: Alvino Chapel, MD;  Location: [facility];  Service: Gynecology;  Laterality: N/A;  . lt knee arthroscopy    . rt total hip replacement     . TONSILLECTOMY    . TOTAL KNEE ARTHROPLASTY Left 12/22/2016   Procedure: LEFT TOTAL KNEE ARTHROPLASTY;  Surgeon: Latanya Maudlin, MD;  Location: [facility];  Service: Orthopedics;  Laterality: Left;  . TRANSTHORACIC ECHOCARDIOGRAM  09/2018   EF 60-65%. Mild AS.  Mod-Severe PAH 60 mmHg  . WISDOM TOOTH EXTRACTION      Family History  Problem Relation Age of Onset  . Heart disease Father   . Thyroid disease Father   . Heart attack Father   . Heart disease Sister        open heart surgery  . Cancer Sister        breast  . Neuropathy Sister        secondary to cancer treatment  . Breast cancer Sister   . Suicidality Brother 78  . Arthritis Mother   . Cancer Daughter   . Breast cancer Daughter     New complaints: None today  Social history: Lives with her husband  Controlled substance contract: n/a    Review of Systems  Constitutional: Negative for diaphoresis.  Eyes: Negative for pain.  Respiratory: Negative for shortness of breath.   Cardiovascular: Negative for chest pain, palpitations and leg swelling.  Gastrointestinal: Negative for abdominal pain.  Endocrine: Negative for polydipsia.  Skin: Negative for rash.  Neurological: Negative for dizziness, weakness and headaches.  Hematological: Does not bruise/bleed easily.  All other systems reviewed and are negative.      Objective:   Physical Exam Vitals and nursing note reviewed.  Constitutional:      General: She is not in acute distress.    Appearance: Normal appearance. She is well-developed.  HENT:     Head: Normocephalic.     Nose: Nose normal.  Eyes:     Pupils: Pupils are equal, round, and reactive to light.  Neck:     Vascular: No carotid bruit or JVD.  Cardiovascular:     Rate and Rhythm: Normal rate and regular rhythm.     Heart sounds: Normal heart sounds.  Pulmonary:     Effort: Pulmonary effort is normal. No respiratory distress.     Breath sounds: Normal breath sounds. No wheezing or rales.   Chest:     Chest wall: No tenderness.  Abdominal:     General: Bowel sounds are normal. There is no distension or abdominal bruit.     Palpations: Abdomen is soft. There is no hepatomegaly, splenomegaly, mass or pulsatile mass.     Tenderness: There is no abdominal tenderness.  Musculoskeletal:        General: Normal range of motion.     Cervical back: Normal range of motion and neck supple.     Right lower leg: Edema (2+ edema) present.     Left lower leg: Edema (2+ edema) present.  Lymphadenopathy:  Cervical: No cervical adenopathy.  Skin:    General: Skin is warm and dry.  Neurological:     Mental Status: She is alert and oriented to person, place, and time.     Deep Tendon Reflexes: Reflexes are normal and symmetric.  Psychiatric:        Behavior: Behavior normal.        Thought Content: Thought content normal.        Judgment: Judgment normal.     BP 111/69   Pulse 73   Temp 97.6 F (36.4 C) (Temporal)   Resp 20   Ht 5' 4"  (1.626 m)   Wt 181 lb (82.1 kg)   SpO2 94%   BMI 31.07 kg/m        Assessment & Plan:  HANNAN TETZLAFF comes in today with chief complaint of Medical Management of Chronic Issues   Diagnosis and orders addressed:  1. Type 2 diabetes mellitus with diabetic polyneuropathy, without long-term current use of insulin (HCC) contniue to wtahc diet - Bayer DCA Hb A1c Waived - metFORMIN (GLUCOPHAGE) 500 MG tablet; Take 1 tablet (500 mg total) by mouth 2 (two) times daily with a meal.  Dispense: 180 tablet; Refill: 1 - glipiZIDE (GLUCOTROL) 5 MG tablet; Take 0.5 tablets (2.5 mg total) by mouth 2 (two) times daily before a meal.  Dispense: 180 tablet; Refill: 1  2. Essential hypertension Low sodium diet - CBC with Differential/Platelet - CMP14+EGFR - metoprolol succinate (TOPROL-XL) 25 MG 24 hr tablet; Take 1 tablet (25 mg total) by mouth daily.  Dispense: 90 tablet; Refill: 1  3. Mixed hyperlipidemia Low fta diet - Lipid panel  4.  Permanent atrial fibrillation (HCC) CHA2DS2Vasc = 5; Xarelto Avoid caffeien - rivaroxaban (XARELTO) 20 MG TABS tablet; TAKE 1 TABLET EVERY DAY WITH BREAKFAST  Dispense: 90 tablet; Refill: 1  5. Diabetic polyneuropathy associated with type 2 diabetes mellitus ([place]) Do not go bare footed - gabapentin (NEURONTIN) 300 MG capsule; Take 1 capsule (300 mg total) by mouth 2 (two) times daily.  Dispense: 180 capsule; Refill: 1  6. Hypothyroidism due to acquired atrophy of thyroid Labs pending - levothyroxine (SYNTHROID) 175 MCG tablet; TAKE 1 TABLET EVERY DAY BEFORE BREAKFAST  Dispense: 90 tablet; Refill: 1  7. Peripheral edema Elevate legs when sitting Unna boot bil - furosemide (LASIX) 40 MG tablet; Take 2 tablets (80 mg total) by mouth 2 (two) times daily. MAY TAKE AN ADDITIONAL 40 MG IF WEIGHT GAIN OF 3 LBS OR MORE OVER NIGHT  Dispense: 360 tablet; Refill: 1  8. Morbid obesity ([place]) Discussed diet and exercise for person with BMI >25 Will recheck weight in 3-6 months     Labs pending Health Maintenance reviewed Diet and exercise encouraged  Follow up plan: [place], FNP

## 2020-07-10 LAB — CMP14+EGFR
ALT: 28 IU/L (ref 0–32)
AST: 33 IU/L (ref 0–40)
Albumin/Globulin Ratio: 1.8 (ref 1.2–2.2)
Albumin: 3.9 g/dL (ref 3.6–4.6)
Alkaline Phosphatase: 62 IU/L (ref 48–121)
BUN/Creatinine Ratio: 13 (ref 12–28)
BUN: 12 mg/dL (ref 8–27)
Bilirubin Total: 0.9 mg/dL (ref 0.0–1.2)
CO2: 26 mmol/L (ref 20–29)
Calcium: 9.2 mg/dL (ref 8.7–10.3)
Chloride: 104 mmol/L (ref 96–106)
Creatinine, Ser: 0.93 mg/dL (ref 0.57–1.00)
GFR calc Af Amer: 64 mL/min/{1.73_m2} (ref >59)
GFR calc non Af Amer: 56 mL/min/{1.73_m2} — ABNORMAL LOW (ref >59)
Globulin, Total: 2.2 g/dL (ref 1.5–4.5)
Glucose: 124 mg/dL — ABNORMAL HIGH (ref 65–99)
Potassium: 4.1 mmol/L (ref 3.5–5.2)
Sodium: 144 mmol/L (ref 134–144)
Total Protein: 6.1 g/dL (ref 6.0–8.5)

## 2020-07-10 LAB — CBC WITH DIFFERENTIAL/PLATELET
Basophils Absolute: 0 10*3/uL (ref 0.0–0.2)
Basos: 0 %
EOS (ABSOLUTE): 0.1 10*3/uL (ref 0.0–0.4)
Eos: 3 %
Hematocrit: 40.1 % (ref 34.0–46.6)
Hemoglobin: 13.4 g/dL (ref 11.1–15.9)
Immature Grans (Abs): 0 10*3/uL (ref 0.0–0.1)
Immature Granulocytes: 0 %
Lymphocytes Absolute: 1.7 10*3/uL (ref 0.7–3.1)
Lymphs: 38 %
MCH: 30.9 pg (ref 26.6–33.0)
MCHC: 33.4 g/dL (ref 31.5–35.7)
MCV: 92 fL (ref 79–97)
Monocytes Absolute: 0.5 10*3/uL (ref 0.1–0.9)
Monocytes: 10 %
Neutrophils Absolute: 2.2 10*3/uL (ref 1.4–7.0)
Neutrophils: 49 %
Platelets: 203 10*3/uL (ref 150–450)
RBC: 4.34 x10E6/uL (ref 3.77–5.28)
RDW: 13.2 % (ref 11.7–15.4)
WBC: 4.5 10*3/uL (ref 3.4–10.8)

## 2020-07-10 LAB — LIPID PANEL
Chol/HDL Ratio: 3.4 ratio (ref 0.0–4.4)
Cholesterol, Total: 196 mg/dL (ref 100–199)
HDL: 58 mg/dL (ref >39)
LDL Chol Calc (NIH): 119 mg/dL — ABNORMAL HIGH (ref 0–99)
Triglycerides: 107 mg/dL (ref 0–149)
VLDL Cholesterol Cal: 19 mg/dL (ref 5–40)

## 2020-07-11 DIAGNOSIS — M542 Cervicalgia: Secondary | ICD-10-CM | POA: Diagnosis not present

## 2020-07-14 ENCOUNTER — Encounter: Payer: Self-pay | Admitting: Nurse Practitioner

## 2020-07-14 ENCOUNTER — Other Ambulatory Visit: Payer: Self-pay

## 2020-07-14 ENCOUNTER — Ambulatory Visit (INDEPENDENT_AMBULATORY_CARE_PROVIDER_SITE_OTHER): Payer: Medicare Other | Admitting: Nurse Practitioner

## 2020-07-14 VITALS — BP 113/56 | HR 86 | Temp 97.4°F | Resp 20 | Ht 64.0 in | Wt 181.0 lb

## 2020-07-14 DIAGNOSIS — R609 Edema, unspecified: Secondary | ICD-10-CM

## 2020-07-14 NOTE — Progress Notes (Signed)
   Subjective:    Patient ID: Debra Campbell, female    DOB: Sep 02, 1932, 86 y.o.   MRN: [id number]   Chief Complaint: Recheck unna boots   HPI Patient was seen in office on Thursday with bil lower ext edema. We applied unna boots and told her to keep her legs elevated over the weekend. She returns today to have unna boots removed.    Review of Systems  Constitutional: Negative for diaphoresis.  Eyes: Negative for pain.  Respiratory: Negative for shortness of breath.   Cardiovascular: Negative for chest pain, palpitations and leg swelling.  Gastrointestinal: Negative for abdominal pain.  Endocrine: Negative for polydipsia.  Skin: Negative for rash.  Neurological: Negative for dizziness, weakness and headaches.  Hematological: Does not bruise/bleed easily.  All other systems reviewed and are negative.      Objective:   Physical Exam Vitals and nursing note reviewed.  Constitutional:      Appearance: Normal appearance.  Cardiovascular:     Rate and Rhythm: Normal rate and regular rhythm.     Heart sounds: Normal heart sounds.  Musculoskeletal:     Right lower leg: No edema.     Left lower leg: No edema.  Skin:    General: Skin is warm.  Neurological:     General: No focal deficit present.     Mental Status: She is alert.  Psychiatric:        Mood and Affect: Mood normal.        Behavior: Behavior normal.    BP (!) 113/56   Pulse 86   Temp (!) 97.4 F (36.3 C) (Temporal)   Resp 20   Ht 5\' 4"  (1.626 m)   Wt 181 lb (82.1 kg)   SpO2 97%   BMI 31.07 kg/m         Assessment & Plan:  Debra Campbell in today with chief complaint of Recheck unna boots   1. Peripheral edema Continue daily fluid pill Compression hose daily Elevate legs when sitting  [name] Hassell Done, FNP    The above assessment and management plan was discussed with the patient. The patient verbalized understanding of and has agreed to the management plan. Patient is aware to call  the clinic if symptoms persist or worsen. Patient is aware when to return to the clinic for a follow-up visit. Patient educated on when it is appropriate to go to the emergency department.   [name] Hassell Done, FNP

## 2020-07-14 NOTE — Patient Instructions (Signed)
Peripheral Edema  Peripheral edema is swelling that is caused by a buildup of fluid. Peripheral edema most often affects the lower legs, ankles, and feet. It can also develop in the arms, hands, and face. The area of the body that has peripheral edema will look swollen. It may also feel heavy or warm. Your clothes may start to feel tight. Pressing on the area may make a temporary dent in your skin. You may not be able to move your swollen arm or leg as much as usual. There are many causes of peripheral edema. It can happen because of a complication of other conditions such as congestive heart failure, kidney disease, or a problem with your blood circulation. It also can be a side effect of certain medicines or because of an infection. It often happens to women during pregnancy. Sometimes, the cause is not known. Follow these instructions at home: Managing pain, stiffness, and swelling   Raise (elevate) your legs while you are sitting or lying down.  Move around often to prevent stiffness and to lessen swelling.  Do not sit or stand for long periods of time.  Wear support stockings as told by your health care provider. Medicines  Take over-the-counter and prescription medicines only as told by your health care provider.  Your health care provider may prescribe medicine to help your body get rid of excess water (diuretic). General instructions  Pay attention to any changes in your symptoms.  Follow instructions from your health care provider about limiting salt (sodium) in your diet. Sometimes, eating less salt may reduce swelling.  Moisturize skin daily to help prevent skin from cracking and draining.  Keep all follow-up visits as told by your health care provider. This is important. Contact a health care provider if you have:  A fever.  Edema that starts suddenly or is getting worse, especially if you are pregnant or have a medical condition.  Swelling in only one leg.  Increased  swelling, redness, or pain in one or both of your legs.  Drainage or sores at the area where you have edema. Get help right away if you:  Develop shortness of breath, especially when you are lying down.  Have pain in your chest or abdomen.  Feel weak.  Feel faint. Summary  Peripheral edema is swelling that is caused by a buildup of fluid. Peripheral edema most often affects the lower legs, ankles, and feet.  Move around often to prevent stiffness and to lessen swelling. Do not sit or stand for long periods of time.  Pay attention to any changes in your symptoms.  Contact a health care provider if you have edema that starts suddenly or is getting worse, especially if you are pregnant or have a medical condition.  Get help right away if you develop shortness of breath, especially when lying down. This information is not intended to replace advice given to you by your health care provider. Make sure you discuss any questions you have with your health care provider. Document Revised: 08/09/2018 Document Reviewed: 08/09/2018 Elsevier Patient Education  2020 [employer]  

## 2020-07-18 DIAGNOSIS — M542 Cervicalgia: Secondary | ICD-10-CM | POA: Diagnosis not present

## 2020-07-23 DIAGNOSIS — H2513 Age-related nuclear cataract, bilateral: Secondary | ICD-10-CM | POA: Diagnosis not present

## 2020-07-23 DIAGNOSIS — H35371 Puckering of macula, right eye: Secondary | ICD-10-CM | POA: Diagnosis not present

## 2020-07-23 DIAGNOSIS — H43813 Vitreous degeneration, bilateral: Secondary | ICD-10-CM | POA: Diagnosis not present

## 2020-07-23 DIAGNOSIS — H52223 Regular astigmatism, bilateral: Secondary | ICD-10-CM | POA: Diagnosis not present

## 2020-07-23 DIAGNOSIS — H524 Presbyopia: Secondary | ICD-10-CM | POA: Diagnosis not present

## 2020-07-23 DIAGNOSIS — E119 Type 2 diabetes mellitus without complications: Secondary | ICD-10-CM | POA: Diagnosis not present

## 2020-07-23 DIAGNOSIS — H5203 Hypermetropia, bilateral: Secondary | ICD-10-CM | POA: Diagnosis not present

## 2020-07-23 LAB — HM DIABETES EYE EXAM

## 2020-07-29 DIAGNOSIS — M542 Cervicalgia: Secondary | ICD-10-CM | POA: Diagnosis not present

## 2020-08-18 ENCOUNTER — Telehealth: Payer: Self-pay | Admitting: Nurse Practitioner

## 2020-08-19 NOTE — Telephone Encounter (Signed)
New form typed up and placed on providers desk

## 2020-08-22 ENCOUNTER — Telehealth: Payer: Self-pay | Admitting: Nurse Practitioner

## 2020-08-22 DIAGNOSIS — Z029 Encounter for administrative examinations, unspecified: Secondary | ICD-10-CM

## 2020-08-22 NOTE — Telephone Encounter (Signed)
Pt aware form up front and ready to be picked up.

## 2020-09-01 NOTE — Telephone Encounter (Signed)
Pt did got form & has gotten her handicap placard

## 2020-09-03 ENCOUNTER — Telehealth: Payer: Self-pay

## 2020-09-03 NOTE — Telephone Encounter (Signed)
Confirmed pt is doing 500 mg BID since 03/04/20 OV notes

## 2020-09-10 ENCOUNTER — Telehealth: Payer: Self-pay

## 2020-09-10 MED ORDER — ACCU-CHEK SOFTCLIX LANCETS MISC
3 refills | Status: DC
Start: 2020-09-10 — End: 2021-09-11

## 2020-09-10 NOTE — Telephone Encounter (Signed)
Pt aware lancets sent to pharmacy. Strips were sent in July for a years worth, she may need to ask for them by name they may have a new Rx# at the pharmacy

## 2020-09-10 NOTE — Telephone Encounter (Signed)
Pt needs more lancets and  test strips sent to [facility] pharmacy in [place].

## 2020-10-14 ENCOUNTER — Ambulatory Visit (INDEPENDENT_AMBULATORY_CARE_PROVIDER_SITE_OTHER): Payer: Medicare Other | Admitting: Nurse Practitioner

## 2020-10-14 ENCOUNTER — Encounter: Payer: Self-pay | Admitting: Nurse Practitioner

## 2020-10-14 ENCOUNTER — Other Ambulatory Visit: Payer: Self-pay

## 2020-10-14 VITALS — BP 114/69 | HR 78 | Temp 97.3°F | Resp 20 | Ht 64.0 in | Wt 181.0 lb

## 2020-10-14 DIAGNOSIS — M8588 Other specified disorders of bone density and structure, other site: Secondary | ICD-10-CM | POA: Diagnosis not present

## 2020-10-14 DIAGNOSIS — I10 Essential (primary) hypertension: Secondary | ICD-10-CM | POA: Diagnosis not present

## 2020-10-14 DIAGNOSIS — R609 Edema, unspecified: Secondary | ICD-10-CM | POA: Diagnosis not present

## 2020-10-14 DIAGNOSIS — E782 Mixed hyperlipidemia: Secondary | ICD-10-CM | POA: Diagnosis not present

## 2020-10-14 DIAGNOSIS — I4821 Permanent atrial fibrillation: Secondary | ICD-10-CM

## 2020-10-14 DIAGNOSIS — I700 Atherosclerosis of aorta: Secondary | ICD-10-CM | POA: Diagnosis not present

## 2020-10-14 DIAGNOSIS — I152 Hypertension secondary to endocrine disorders: Secondary | ICD-10-CM | POA: Diagnosis not present

## 2020-10-14 DIAGNOSIS — E1159 Type 2 diabetes mellitus with other circulatory complications: Secondary | ICD-10-CM | POA: Diagnosis not present

## 2020-10-14 DIAGNOSIS — E1142 Type 2 diabetes mellitus with diabetic polyneuropathy: Secondary | ICD-10-CM | POA: Diagnosis not present

## 2020-10-14 DIAGNOSIS — E034 Atrophy of thyroid (acquired): Secondary | ICD-10-CM | POA: Diagnosis not present

## 2020-10-14 DIAGNOSIS — C541 Malignant neoplasm of endometrium: Secondary | ICD-10-CM

## 2020-10-14 DIAGNOSIS — Z23 Encounter for immunization: Secondary | ICD-10-CM | POA: Diagnosis not present

## 2020-10-14 LAB — CBC WITH DIFFERENTIAL/PLATELET
Basophils Absolute: 0 10*3/uL (ref 0.0–0.2)
Basos: 1 %
EOS (ABSOLUTE): 0.3 10*3/uL (ref 0.0–0.4)
Eos: 5 %
Hematocrit: 41.6 % (ref 34.0–46.6)
Hemoglobin: 14.1 g/dL (ref 11.1–15.9)
Immature Grans (Abs): 0 10*3/uL (ref 0.0–0.1)
Immature Granulocytes: 0 %
Lymphocytes Absolute: 1.9 10*3/uL (ref 0.7–3.1)
Lymphs: 34 %
MCH: 31.3 pg (ref 26.6–33.0)
MCHC: 33.9 g/dL (ref 31.5–35.7)
MCV: 92 fL (ref 79–97)
Monocytes Absolute: 0.6 10*3/uL (ref 0.1–0.9)
Monocytes: 11 %
Neutrophils Absolute: 2.8 10*3/uL (ref 1.4–7.0)
Neutrophils: 49 %
Platelets: 179 10*3/uL (ref 150–450)
RBC: 4.5 x10E6/uL (ref 3.77–5.28)
RDW: 12.1 % (ref 11.7–15.4)
WBC: 5.6 10*3/uL (ref 3.4–10.8)

## 2020-10-14 LAB — CMP14+EGFR
ALT: 14 IU/L (ref 0–32)
AST: 25 IU/L (ref 0–40)
Albumin/Globulin Ratio: 1.7 (ref 1.2–2.2)
Albumin: 4.5 g/dL (ref 3.6–4.6)
Alkaline Phosphatase: 69 IU/L (ref 44–121)
BUN/Creatinine Ratio: 24 (ref 12–28)
BUN: 25 mg/dL (ref 8–27)
Bilirubin Total: 0.6 mg/dL (ref 0.0–1.2)
CO2: 25 mmol/L (ref 20–29)
Calcium: 10.1 mg/dL (ref 8.7–10.3)
Chloride: 99 mmol/L (ref 96–106)
Creatinine, Ser: 1.03 mg/dL — ABNORMAL HIGH (ref 0.57–1.00)
GFR calc Af Amer: 57 mL/min/{1.73_m2} — ABNORMAL LOW (ref >59)
GFR calc non Af Amer: 49 mL/min/{1.73_m2} — ABNORMAL LOW (ref >59)
Globulin, Total: 2.7 g/dL (ref 1.5–4.5)
Glucose: 117 mg/dL — ABNORMAL HIGH (ref 65–99)
Potassium: 4.8 mmol/L (ref 3.5–5.2)
Sodium: 141 mmol/L (ref 134–144)
Total Protein: 7.2 g/dL (ref 6.0–8.5)

## 2020-10-14 LAB — LIPID PANEL
Chol/HDL Ratio: 2.9 ratio (ref 0.0–4.4)
Cholesterol, Total: 188 mg/dL (ref 100–199)
HDL: 65 mg/dL (ref >39)
LDL Chol Calc (NIH): 105 mg/dL — ABNORMAL HIGH (ref 0–99)
Triglycerides: 100 mg/dL (ref 0–149)
VLDL Cholesterol Cal: 18 mg/dL (ref 5–40)

## 2020-10-14 LAB — BAYER DCA HB A1C WAIVED: HB A1C (BAYER DCA - WAIVED): 5.8 % (ref <7.0)

## 2020-10-14 MED ORDER — METOPROLOL SUCCINATE ER 25 MG PO TB24: 25.0000 mg | ORAL_TABLET | Freq: Every day | ORAL | 1 refills | Status: DC

## 2020-10-14 MED ORDER — FUROSEMIDE 40 MG PO TABS: 80.0000 mg | ORAL_TABLET | Freq: Two times a day (BID) | ORAL | 1 refills | Status: DC

## 2020-10-14 MED ORDER — LEVOTHYROXINE SODIUM 175 MCG PO TABS: ORAL_TABLET | ORAL | 1 refills | Status: DC

## 2020-10-14 MED ORDER — RIVAROXABAN 20 MG PO TABS: ORAL_TABLET | ORAL | 1 refills | Status: DC

## 2020-10-14 MED ORDER — GABAPENTIN 300 MG PO CAPS: 300.0000 mg | ORAL_CAPSULE | Freq: Two times a day (BID) | ORAL | 1 refills | Status: DC

## 2020-10-14 MED ORDER — METFORMIN HCL 500 MG PO TABS: 500.0000 mg | ORAL_TABLET | Freq: Two times a day (BID) | ORAL | 1 refills | Status: DC

## 2020-10-14 NOTE — Progress Notes (Signed)
Subjective:    Patient ID: Debra Campbell, female    DOB: Dec 03, 1931, 86 y.o.   MRN: [id number]   Chief Complaint: Medical Management of Chronic Issues    HPI:  1. Type 2 diabetes mellitus with diabetic polyneuropathy, without long-term current use of insulin (HCC) Fasting blood sugars having been averaging below 120. She denies any low blood sugars. Lab Results  Component Value Date   HGBA1C 6.2 07/09/2020     2. Essential hypertension No c/o chest pain, sob or headache. Does not check blood pressure at home. BP Readings from Last 3 Encounters:  10/14/20 114/69  07/14/20 (!) 113/56  07/09/20 111/69     3. Mixed hyperlipidemia Does not watch diet and does very little exercise. Lab Results  Component Value Date   CHOL 196 07/09/2020   HDL 58 07/09/2020   LDLCALC 119 (H) 07/09/2020   TRIG 107 07/09/2020   CHOLHDL 3.4 07/09/2020     4. Aortic calcification ([place]) Saw cardiology in march. According to office note, no changes were made to plan of care.   5. Diabetic polyneuropathy associated with type 2 diabetes mellitus ([place]) Has some numbness and tingling in feet sometimes. deniens any pain in feet.  6. Hypothyroidism due to acquired atrophy of thyroid No problems that aware of. Lab Results  Component Value Date   TSH 0.882 02/01/2019     7. Peripheral edema Has daily by the end of day. Propping legs up helps. She cannot wear compression hose because they break her legs out. Thinks it is from nylon.  8. Osteopenia of lumbar spine Last dexascan was done 02/06/17 with tscore of -1.0. she does no weight bearing exercises  9. Endometrial cancer ([place]) Cleared years ago. Had hysterectomy . Did not require any other treatment.  10. Morbid obesity ([place]) No recent weight changes Wt Readings from Last 3 Encounters:  10/14/20 181 lb (82.1 kg)  07/14/20 181 lb (82.1 kg)  07/09/20 181 lb (82.1 kg)   BMI Readings from Last 3 Encounters:  10/14/20 31.07 kg/m   07/14/20 31.07 kg/m  07/09/20 31.07 kg/m      Outpatient Encounter Medications as of 10/14/2020  Medication Sig  . Accu-Chek Softclix Lancets lancets Check BS once daily Dx E11.42  . acetaminophen (TYLENOL) 500 MG tablet Take 1 tablet (500 mg total) by mouth every 8 (eight) hours as needed.  . blood glucose meter kit and supplies Dispense based on patient and insurance preference. Use up to four times daily as directed. (FOR ICD-10 E10.9, E11.9).  . furosemide (LASIX) 40 MG tablet Take 2 tablets (80 mg total) by mouth 2 (two) times daily. MAY TAKE AN ADDITIONAL 40 MG IF WEIGHT GAIN OF 3 LBS OR MORE OVER NIGHT  . gabapentin (NEURONTIN) 300 MG capsule Take 1 capsule (300 mg total) by mouth 2 (two) times daily.  [name] glipiZIDE (GLUCOTROL) 5 MG tablet Take 0.5 tablets (2.5 mg total) by mouth 2 (two) times daily before a meal.  . glucose blood (ACCU-CHEK GUIDE) test strip Check BS once daily Dx E11.42  . levothyroxine (SYNTHROID) 175 MCG tablet TAKE 1 TABLET EVERY DAY BEFORE BREAKFAST  . metFORMIN (GLUCOPHAGE) 500 MG tablet Take 1 tablet (500 mg total) by mouth 2 (two) times daily with a meal.  . metoprolol succinate (TOPROL-XL) 25 MG 24 hr tablet Take 1 tablet (25 mg total) by mouth daily.  [name] nystatin cream (MYCOSTATIN) Apply 1 application topically 2 (two) times daily.  . rivaroxaban (XARELTO) 20 MG TABS  tablet TAKE 1 TABLET EVERY DAY WITH BREAKFAST  . triamcinolone cream (KENALOG) 0.5 % APPLY 1 APPLICATION TOPICALLY THREE TIMES DAILY AS NEEDED.     Past Surgical History:  Procedure Laterality Date  . ABDOMINAL HYSTERECTOMY Bilateral 06/26/2013   Procedure: TOTAL ABDOMINAL HYSTERECTOMY WITH BILATERAL SALPINGO OOPHERECTOMY WITH PELVIC LYMPHADNECTOMY;  Surgeon: Alvino Chapel, MD;  Location: [facility];  Service: Gynecology;  Laterality: Bilateral;  . BACK SURGERY  06/26/2013  . ECTOPIC PREGNANCY SURGERY    . HERNIA REPAIR    . HYSTEROSCOPY WITH D & C N/A 04/26/2013   Procedure:  DILATATION AND CURETTAGE /HYSTEROSCOPY;  Surgeon: Woodroe Mode, MD;  Location: [facility];  Service: Gynecology;  Laterality: N/A;  . JOINT REPLACEMENT    . LAPAROTOMY     for ectopic pregnancy  . LAPAROTOMY N/A 06/26/2013   Procedure: EXPLORATORY LAPAROTOMY;  Surgeon: Alvino Chapel, MD;  Location: [facility];  Service: Gynecology;  Laterality: N/A;  . lt knee arthroscopy    . rt total hip replacement    . TONSILLECTOMY    . TOTAL KNEE ARTHROPLASTY Left 12/22/2016   Procedure: LEFT TOTAL KNEE ARTHROPLASTY;  Surgeon: Latanya Maudlin, MD;  Location: [facility];  Service: Orthopedics;  Laterality: Left;  . TRANSTHORACIC ECHOCARDIOGRAM  09/2018   EF 60-65%. Mild AS.  Mod-Severe PAH 60 mmHg  . WISDOM TOOTH EXTRACTION      Family History  Problem Relation Age of Onset  . Heart disease Father   . Thyroid disease Father   . Heart attack Father   . Heart disease Sister        open heart surgery  . Cancer Sister        breast  . Neuropathy Sister        secondary to cancer treatment  . Breast cancer Sister   . Suicidality Brother 58  . Arthritis Mother   . Cancer Daughter   . Breast cancer Daughter     New complaints: None today  Social history: Lives with her husband- they take care of each other  Controlled substance contract: n/a    Review of Systems  Constitutional: Negative for diaphoresis.  Eyes: Negative for pain.  Respiratory: Negative for shortness of breath.   Cardiovascular: Negative for chest pain, palpitations and leg swelling.  Gastrointestinal: Negative for abdominal pain.  Endocrine: Negative for polydipsia.  Skin: Negative for rash.  Neurological: Negative for dizziness, weakness and headaches.  Hematological: Does not bruise/bleed easily.  All other systems reviewed and are negative.      Objective:   Physical Exam Vitals and nursing note reviewed.  Constitutional:      General: She is not in acute distress.    Appearance: Normal appearance. She is  well-developed.  HENT:     Head: Normocephalic.     Nose: Nose normal.  Eyes:     Pupils: Pupils are equal, round, and reactive to light.  Neck:     Vascular: No carotid bruit or JVD.  Cardiovascular:     Rate and Rhythm: Normal rate and regular rhythm.     Heart sounds: Normal heart sounds.  Pulmonary:     Effort: Pulmonary effort is normal. No respiratory distress.     Breath sounds: Normal breath sounds. No wheezing or rales.  Chest:     Chest wall: No tenderness.  Abdominal:     General: Bowel sounds are normal. There is no distension or abdominal bruit.     Palpations: Abdomen is soft. There  is no hepatomegaly, splenomegaly, mass or pulsatile mass.     Tenderness: There is no abdominal tenderness.  Musculoskeletal:        General: Normal range of motion.     Cervical back: Normal range of motion and neck supple.     Comments: walking with walker  Lymphadenopathy:     Cervical: No cervical adenopathy.  Skin:    General: Skin is warm and dry.  Neurological:     Mental Status: She is alert and oriented to person, place, and time.     Deep Tendon Reflexes: Reflexes are normal and symmetric.  Psychiatric:        Behavior: Behavior normal.        Thought Content: Thought content normal.        Judgment: Judgment normal.    BP 114/69   Pulse 78   Temp (!) 97.3 F (36.3 C) (Temporal)   Resp 20   Ht 5' 4"  (1.626 m)   Wt 181 lb (82.1 kg)   SpO2 99%   BMI 31.07 kg/m   hgba1c 5.6%       Assessment & Plan:  Debra Campbell comes in today with chief complaint of Medical Management of Chronic Issues   Diagnosis and orders addressed:  1. Type 2 diabetes mellitus with diabetic polyneuropathy, without long-term current use of insulin (HCC) Continue to hold glipizide cpntinue to wtahc carbsin diet - Bayer DCA Hb A1c Waived - Microalbumin / creatinine urine ratio - metFORMIN (GLUCOPHAGE) 500 MG tablet; Take 1 tablet (500 mg total) by mouth 2 (two) times daily with a  meal.  Dispense: 180 tablet; Refill: 1  2. Hypertension associated with type 2 diabetes mellitus (HCC) Low fat diet - CBC with Differential/Platelet - CMP14+EGFR - metoprolol succinate (TOPROL-XL) 25 MG 24 hr tablet; Take 1 tablet (25 mg total) by mouth daily.  Dispense: 90 tablet; Refill: 1  3. Aortic calcification (HCC) Keep follow up with cardiology  4. Mixed hyperlipidemia Low fat diet - Lipid panel  5. Diabetic polyneuropathy associated with type 2 diabetes mellitus ([place]) Do not go bare footed Check feet daily - gabapentin (NEURONTIN) 300 MG capsule; Take 1 capsule (300 mg total) by mouth 2 (two) times daily.  Dispense: 180 capsule; Refill: 1  6. Hypothyroidism due to acquired atrophy of thyroid Labs pending - levothyroxine (SYNTHROID) 175 MCG tablet; TAKE 1 TABLET EVERY DAY BEFORE BREAKFAST  Dispense: 90 tablet; Refill: 1  7. Peripheral edema Elevate legs when sittikngf - furosemide (LASIX) 40 MG tablet; Take 2 tablets (80 mg total) by mouth 2 (two) times daily. MAY TAKE AN ADDITIONAL 40 MG IF WEIGHT GAIN OF 3 LBS OR MORE OVER NIGHT  Dispense: 360 tablet; Refill: 1  8. Osteopenia of lumbar spine Weight bearing exercise if can tolerate  9. Endometrial cancer ([place])  10. Morbid obesity ([place]) Discussed diet and exercise for person with BMI >25 Will recheck weight in 3-6 months  11. Permanent atrial fibrillation (HCC) - rivaroxaban (XARELTO) 20 MG TABS tablet; TAKE 1 TABLET EVERY DAY WITH BREAKFAST  Dispense: 90 tablet; Refill: 1   Labs pending Health Maintenance reviewed Diet and exercise encouraged  Follow up plan: 3 months   [name] Hassell Done, FNP

## 2020-10-14 NOTE — Patient Instructions (Signed)

## 2020-10-16 LAB — THYROID PANEL WITH TSH
Free Thyroxine Index: 3.3 (ref 1.2–4.9)
T3 Uptake Ratio: 33 % (ref 24–39)
T4, Total: 10 ug/dL (ref 4.5–12.0)
TSH: 0.034 u[IU]/mL — ABNORMAL LOW (ref 0.450–4.500)

## 2020-10-16 LAB — SPECIMEN STATUS REPORT

## 2020-10-16 MED ORDER — LEVOTHYROXINE SODIUM 150 MCG PO TABS: 150.0000 ug | ORAL_TABLET | Freq: Every day | ORAL | 1 refills | Status: DC

## 2020-10-16 NOTE — Addendum Note (Signed)
Addended by: Chevis Pretty on: 10/16/2020 07:39 AM   Modules accepted: Orders

## 2020-11-18 ENCOUNTER — Ambulatory Visit (INDEPENDENT_AMBULATORY_CARE_PROVIDER_SITE_OTHER): Payer: Medicare Other | Admitting: *Deleted

## 2020-11-18 DIAGNOSIS — Z0000 Encounter for general adult medical examination without abnormal findings: Secondary | ICD-10-CM | POA: Diagnosis not present

## 2020-11-18 NOTE — Progress Notes (Signed)
MEDICARE ANNUAL WELLNESS VISIT  11/18/2020  Telephone Visit Disclaimer This Medicare AWV was conducted by telephone due to national recommendations for restrictions regarding the COVID-19 Pandemic (e.g. social distancing).  I verified, using two identifiers, that I am speaking with Debra Campbell or their authorized healthcare agent. I discussed the limitations, risks, security, and privacy concerns of performing an evaluation and management service by telephone and the potential availability of an in-person appointment in the future. The patient expressed understanding and agreed to proceed.  Location of Patient: home  Location of Provider (nurse): [place]  Subjective:    Debra Campbell is a 86 y.o. female patient of Chevis Pretty, [place] who had a Medicare Annual Wellness Visit today via telephone. Debra Campbell is Retired and lives with their spouse. she has 2 living children. she reports that she is socially active and does interact with friends/family regularly. she is not physically active and enjoys reading and watching TV.  Patient Care Team: Chevis Pretty, FNP as PCP - General (Nurse Practitioner) Leonie Man, MD as PCP - Cardiology (Cardiology) Marti Sleigh, MD as Attending Physician (Gynecology) Latanya Maudlin, MD as Consulting Physician (Orthopedic Surgery) Leonie Man, MD as Consulting Physician (Cardiology) Ilean China, RN as Registered Nurse  Advanced Directives 11/18/2020 11/14/2019 10/30/2018 10/09/2018 04/11/2018 10/26/2017 04/15/2017  Does Patient Have a Medical Advance Directive? No No Yes No Yes No Yes  Type of Advance Directive - Public librarian;Living will - [place];Living will  Does patient want to make changes to medical advance directive? - - No - Patient declined - - - -  Copy of [place] in Chart? - - No - copy requested - No - copy  requested - No - copy requested  Would patient like information on creating a medical advance directive? No - Patient declined No - Patient declined - No - Patient declined - No - Patient declined -  Pre-existing out of facility DNR order (yellow form or pink MOST form) - - - - - - -    Hospital Utilization Over the Past 12 Months: # of hospitalizations or ER visits: 0 # of surgeries: 0  Review of Systems    Patient reports that her overall health is unchanged compared to last year.  History obtained from chart review and the patient  Patient Reported Readings (BP, Pulse, CBG, Weight, etc) CBG 97  Pain Assessment Pain : No/denies pain     Current Medications & Allergies (verified) Allergies as of 11/18/2020      Reactions   Ace Inhibitors Cough   Iohexol     Desc: HIVES 40 YEARS AGO   Statins    myalgia   Penicillins Swelling, Rash   Has patient had a PCN reaction causing immediate rash, facial/tongue/throat swelling, SOB or lightheadedness with hypotension: Yes Has patient had a PCN reaction causing severe rash involving mucus membranes or skin necrosis: No Has patient had a PCN reaction that required hospitalization Yes Has patient had a PCN reaction occurring within the last 10 years: No If all of the above answers are "NO", then may proceed with Cephalosporin use.   Sulfa Antibiotics Rash      Medication List       Accurate as of November 18, 2020 11:18 AM. If you have any questions, ask your nurse or doctor.        Accu-Chek Guide test strip Generic drug: glucose blood  Check BS once daily Dx E11.42   Accu-Chek Softclix Lancets lancets Check BS once daily Dx E11.42   acetaminophen 500 MG tablet Commonly known as: TYLENOL Take 1 tablet (500 mg total) by mouth every 8 (eight) hours as needed.   blood glucose meter kit and supplies Dispense based on patient and insurance preference. Use up to four times daily as directed. (FOR ICD-10 E10.9, E11.9).    furosemide 40 MG tablet Commonly known as: LASIX Take 2 tablets (80 mg total) by mouth 2 (two) times daily. MAY TAKE AN ADDITIONAL 40 MG IF WEIGHT GAIN OF 3 LBS OR MORE OVER NIGHT What changed: when to take this   gabapentin 300 MG capsule Commonly known as: NEURONTIN Take 1 capsule (300 mg total) by mouth 2 (two) times daily.   levothyroxine 150 MCG tablet Commonly known as: SYNTHROID Take 1 tablet (150 mcg total) by mouth daily.   metFORMIN 500 MG tablet Commonly known as: GLUCOPHAGE Take 1 tablet (500 mg total) by mouth 2 (two) times daily with a meal.   metoprolol succinate 25 MG 24 hr tablet Commonly known as: TOPROL-XL Take 1 tablet (25 mg total) by mouth daily.   nystatin cream Commonly known as: MYCOSTATIN Apply 1 application topically 2 (two) times daily.   rivaroxaban 20 MG Tabs tablet Commonly known as: Xarelto TAKE 1 TABLET EVERY DAY WITH BREAKFAST   triamcinolone cream 0.5 % Commonly known as: KENALOG APPLY 1 APPLICATION TOPICALLY THREE TIMES DAILY AS NEEDED.       History (reviewed): Past Medical History:  Diagnosis Date  . Aortic calcification ([place]) 2013   Atherosclerotic calcifications of the abdominal aorta and branch noted on CT Abd/Pelvis  . Arthritis    legs, lower back, hands  . Cancer ([place])    endometrial  . Diabetes mellitus   . Ectopic pregnancy   . Eczema   . Headache(784.0)    hx of  . History of kidney stones   . Hyperlipidemia    no meds  . Hypertension   . Hypothyroidism   . Persistent atrial fibrillation ([place]) 09/28/2016   Relatively new Dx: Asymptomatic.  Rate controlled without medication.  . SVD (spontaneous vaginal delivery)    x 3  . Thyroid disease    Past Surgical History:  Procedure Laterality Date  . ABDOMINAL HYSTERECTOMY Bilateral 06/26/2013   Procedure: TOTAL ABDOMINAL HYSTERECTOMY WITH BILATERAL SALPINGO OOPHERECTOMY WITH PELVIC LYMPHADNECTOMY;  Surgeon: Alvino Chapel, MD;  Location: [facility];   Service: Gynecology;  Laterality: Bilateral;  . BACK SURGERY  06/26/2013  . ECTOPIC PREGNANCY SURGERY    . HERNIA REPAIR    . HYSTEROSCOPY WITH D & C N/A 04/26/2013   Procedure: DILATATION AND CURETTAGE /HYSTEROSCOPY;  Surgeon: Woodroe Mode, MD;  Location: [facility];  Service: Gynecology;  Laterality: N/A;  . JOINT REPLACEMENT    . LAPAROTOMY     for ectopic pregnancy  . LAPAROTOMY N/A 06/26/2013   Procedure: EXPLORATORY LAPAROTOMY;  Surgeon: Alvino Chapel, MD;  Location: [facility];  Service: Gynecology;  Laterality: N/A;  . lt knee arthroscopy    . rt total hip replacement    . TONSILLECTOMY    . TOTAL KNEE ARTHROPLASTY Left 12/22/2016   Procedure: LEFT TOTAL KNEE ARTHROPLASTY;  Surgeon: Latanya Maudlin, MD;  Location: [facility];  Service: Orthopedics;  Laterality: Left;  . TRANSTHORACIC ECHOCARDIOGRAM  09/2018   EF 60-65%. Mild AS.  Mod-Severe PAH 60 mmHg  . WISDOM TOOTH EXTRACTION     Family  History  Problem Relation Age of Onset  . Heart disease Father   . Thyroid disease Father   . Heart attack Father   . Heart disease Sister        open heart surgery  . Cancer Sister        breast  . Neuropathy Sister        secondary to cancer treatment  . Breast cancer Sister   . Suicidality Brother 49  . Arthritis Mother   . Cancer Daughter   . Breast cancer Daughter    Social History   Socioeconomic History  . Marital status: Married    Spouse name: Not on file  . Number of children: 3  . Years of education: Not on file  . Highest education level: High school graduate  Occupational History  . Occupation: Retired    Comment: [employer]  Tobacco Use  . Smoking status: Never Smoker  . Smokeless tobacco: Never Used  Vaping Use  . Vaping Use: Never used  Substance and Sexual Activity  . Alcohol use: No  . Drug use: No  . Sexual activity: Not Currently    Birth control/protection: Post-menopausal  Other Topics Concern  . Not on file  Social History Narrative  . Not on file    Social Determinants of Health   Financial Resource Strain: Low Risk   . Difficulty of Paying Living Expenses: Not hard at all  Food Insecurity: No Food Insecurity  . Worried About Charity fundraiser in the Last Year: Never true  . Ran Out of Food in the Last Year: Never true  Transportation Needs: No Transportation Needs  . Lack of Transportation (Medical): No  . Lack of Transportation (Non-Medical): No  Physical Activity: Inactive  . Days of Exercise per Week: 0 days  . Minutes of Exercise per Session: 0 min  Stress: No Stress Concern Present  . Feeling of Stress : Only a little  Social Connections: Moderately Integrated  . Frequency of Communication with Friends and Family: More than three times a week  . Frequency of Social Gatherings with Friends and Family: Three times a week  . Attends Religious Services: More than 4 times per year  . Active Member of Clubs or Organizations: No  . Attends Archivist Meetings: Never  . Marital Status: Married    Activities of Daily Living In your present state of health, do you have any difficulty performing the following activities: 11/18/2020  Hearing? N  Vision? Y  Comment glasses  Difficulty concentrating or making decisions? Y  Walking or climbing stairs? Y  Dressing or bathing? N  Doing errands, shopping? Y  Comment has one Agricultural engineer and eating ? N  Using the Toilet? N  In the past six months, have you accidently leaked urine? N  Managing your Medications? N  Housekeeping or managing your Housekeeping? Y  Comment children do  Some recent data might be hidden    Patient Education/ Literacy How often do you need to have someone help you when you read instructions, pamphlets, or other written materials from your doctor or pharmacy?: 1 - Never What is the last grade level you completed in school?: 12  Exercise Current Exercise Habits: The patient does not participate in regular exercise at  present, Exercise limited by: orthopedic condition(s)  Diet Patient reports consuming 1 meals a day and 2 snack(s) a day Patient reports that her primary diet is: Regular Patient reports that she does have  regular access to food.   Depression Screen PHQ 2/9 Scores 11/18/2020 10/14/2020 07/14/2020 07/09/2020 04/08/2020 03/04/2020 12/04/2019  PHQ - 2 Score 0 0 0 0 0 0 0     Fall Risk Fall Risk  11/18/2020 10/14/2020 07/14/2020 07/09/2020 04/08/2020  Falls in the past year? 1 0 0 1 0  Comment - - - - -  Number falls in past yr: 0 - - 0 -  Injury with Fall? - - - 0 -  Risk for fall due to : - - - - -  Follow up Falls prevention discussed - - - -     Objective:  Debra Campbell seemed alert and oriented and she participated appropriately during our telephone visit.  Blood Pressure Weight BMI  BP Readings from Last 3 Encounters:  10/14/20 114/69  07/14/20 (!) 113/56  07/09/20 111/69   Wt Readings from Last 3 Encounters:  10/14/20 181 lb (82.1 kg)  07/14/20 181 lb (82.1 kg)  07/09/20 181 lb (82.1 kg)   BMI Readings from Last 1 Encounters:  10/14/20 31.07 kg/m    *Unable to obtain current vital signs, weight, and BMI due to telephone visit type  Hearing/Vision  . Debra Campbell did  seem to have difficulty with hearing/understanding during the telephone conversation . Reports that she has had a formal eye exam by an eye care professional within the past year . Reports that she has not had a formal hearing evaluation within the past year *Unable to fully assess hearing and vision during telephone visit type  Cognitive Function: 6CIT Screen 11/18/2020 11/14/2019  What Year? 0 points 0 points  What month? 0 points 0 points  What time? 0 points 0 points  Count back from 20 0 points 0 points  Months in reverse 2 points 0 points  Repeat phrase 2 points 0 points  Total Score 4 0   (Normal:0-7, Significant for Dysfunction: >8)  Normal Cognitive Function Screening: Yes   Immunization &  Health Maintenance Record Immunization History  Administered Date(s) Administered  . Fluad Quad(high Dose 65+) 08/29/2019, 10/14/2020  . Influenza, High Dose Seasonal PF 09/28/2016, 10/06/2017, 10/17/2018  . Influenza,inj,Quad PF,6+ Mos 09/26/2013, 09/09/2014, 10/13/2015  . Moderna Sars-Covid-2 Vaccination 12/22/2019, 01/19/2020  . Pneumococcal Conjugate-13 05/01/2015  . Pneumococcal Polysaccharide-23 03/29/2002  . Td 10/29/2008, 08/31/2011  . Tdap 08/31/2011  . Zoster Recombinat (Shingrix) 10/30/2018    Health Maintenance  Topic Date Due  . COVID-19 Vaccine (3 - Moderna risk 4-dose series) 02/16/2020  . URINE MICROALBUMIN  08/28/2020  . MAMMOGRAM  10/14/2021 (Originally 12/06/2019)  . FOOT EXAM  12/03/2020  . HEMOGLOBIN A1C  04/13/2021  . OPHTHALMOLOGY EXAM  07/23/2021  . TETANUS/TDAP  08/30/2021  . INFLUENZA VACCINE  Completed  . DEXA SCAN  Completed  . PNA vac Low Risk Adult  Completed       Assessment  This is a routine wellness examination for Debra Campbell.  Health Maintenance: Due or Overdue Health Maintenance Due  Topic Date Due  . COVID-19 Vaccine (3 - Moderna risk 4-dose series) 02/16/2020  . URINE MICROALBUMIN  08/28/2020    Debra Campbell does not need a referral for Community Assistance: Care Management:   no Social Work:    no Prescription Assistance:  no Nutrition/Diabetes Education:  no   Plan:  Personalized Goals Goals Addressed            This Visit's Progress   . AWV   On track    11/14/2019 AWV Goal:  Fall Prevention  . Over the next year, patient will decrease their risk for falls by: o Using assistive devices, such as a cane or walker, as needed o Identifying fall risks within their home and correcting them by: - Removing throw rugs - Adding handrails to stairs or ramps - Removing clutter and keeping a clear pathway throughout the home - Increasing light, especially at night - Adding shower handles/bars - Raising toilet  seat o Identifying potential personal risk factors for falls: - Medication side effects - Incontinence/urgency - Vestibular dysfunction - Hearing loss - Musculoskeletal disorders - Neurological disorders - Orthostatic hypotension      . Have 3 meals a day        Personalized Health Maintenance & Screening Recommendations  shingles vaccine- 2nd  Lung Cancer Screening Recommended: no (Low Dose CT Chest recommended if Age 63-80 years, 30 pack-year currently smoking OR have quit w/in past 15 years) Hepatitis C Screening recommended: no HIV Screening recommended: no  Advanced Directives: Written information was not prepared per patient's request.  Referrals & Orders No orders of the defined types were placed in this encounter.   Follow-up Plan . Follow-up with Chevis Pretty, FNP as planned on 01/15/21. [name] Pt needs 2nd shingrix vaccine. . Needs urine microalbumin. . All other health maintenance is up to date. . Pt is mostly independent with ADL's , her family assist with cleaning and large chores. . Pt declined any information on advanced directives. . Patient did have slight difficulty hearing over the phone, recommended a hearing exam. . Pt got new glasses this year. . AVS printed and mailed to pt.     I have personally reviewed and noted the following in the patient's chart:   . Medical and social history . Use of alcohol, tobacco or illicit drugs  . Current medications and supplements . Functional ability and status . Nutritional status . Physical activity . Advanced directives . List of other physicians . Hospitalizations, surgeries, and ER visits in previous 12 months . Vitals . Screenings to include cognitive, depression, and falls . Referrals and appointments  In addition, I have reviewed and discussed with Debra Campbell certain preventive protocols, quality metrics, and best practice recommendations. A written personalized care plan for preventive  services as well as general preventive health recommendations is available and can be mailed to the patient at her request.      Faylene Million Charmaine< LPN  [phone]

## 2021-01-15 ENCOUNTER — Ambulatory Visit (INDEPENDENT_AMBULATORY_CARE_PROVIDER_SITE_OTHER): Payer: Medicare Other | Admitting: Nurse Practitioner

## 2021-01-15 ENCOUNTER — Other Ambulatory Visit: Payer: Self-pay

## 2021-01-15 ENCOUNTER — Encounter: Payer: Self-pay | Admitting: Nurse Practitioner

## 2021-01-15 ENCOUNTER — Ambulatory Visit (INDEPENDENT_AMBULATORY_CARE_PROVIDER_SITE_OTHER): Payer: Medicare Other

## 2021-01-15 VITALS — BP 126/67 | HR 90 | Temp 97.7°F | Resp 20 | Ht 64.0 in | Wt 198.0 lb

## 2021-01-15 DIAGNOSIS — R609 Edema, unspecified: Secondary | ICD-10-CM | POA: Diagnosis not present

## 2021-01-15 DIAGNOSIS — E1159 Type 2 diabetes mellitus with other circulatory complications: Secondary | ICD-10-CM | POA: Diagnosis not present

## 2021-01-15 DIAGNOSIS — M8588 Other specified disorders of bone density and structure, other site: Secondary | ICD-10-CM

## 2021-01-15 DIAGNOSIS — E782 Mixed hyperlipidemia: Secondary | ICD-10-CM | POA: Diagnosis not present

## 2021-01-15 DIAGNOSIS — E1142 Type 2 diabetes mellitus with diabetic polyneuropathy: Secondary | ICD-10-CM

## 2021-01-15 DIAGNOSIS — I4821 Permanent atrial fibrillation: Secondary | ICD-10-CM | POA: Diagnosis not present

## 2021-01-15 DIAGNOSIS — Z23 Encounter for immunization: Secondary | ICD-10-CM

## 2021-01-15 DIAGNOSIS — I152 Hypertension secondary to endocrine disorders: Secondary | ICD-10-CM

## 2021-01-15 DIAGNOSIS — E034 Atrophy of thyroid (acquired): Secondary | ICD-10-CM

## 2021-01-15 LAB — BAYER DCA HB A1C WAIVED: HB A1C (BAYER DCA - WAIVED): 5.7 % (ref <7.0)

## 2021-01-15 MED ORDER — RIVAROXABAN 20 MG PO TABS: ORAL_TABLET | ORAL | 1 refills | Status: DC

## 2021-01-15 MED ORDER — METFORMIN HCL 500 MG PO TABS: 500.0000 mg | ORAL_TABLET | Freq: Two times a day (BID) | ORAL | 1 refills | Status: DC

## 2021-01-15 MED ORDER — LEVOTHYROXINE SODIUM 150 MCG PO TABS
150.0000 ug | ORAL_TABLET | Freq: Every day | ORAL | 1 refills | Status: DC
Start: 2021-01-15 — End: 2021-01-16

## 2021-01-15 MED ORDER — FUROSEMIDE 40 MG PO TABS: 80.0000 mg | ORAL_TABLET | Freq: Two times a day (BID) | ORAL | 1 refills | Status: DC

## 2021-01-15 MED ORDER — GABAPENTIN 300 MG PO CAPS: 300.0000 mg | ORAL_CAPSULE | Freq: Two times a day (BID) | ORAL | 1 refills | Status: DC

## 2021-01-15 MED ORDER — METOPROLOL SUCCINATE ER 25 MG PO TB24: 25.0000 mg | ORAL_TABLET | Freq: Every day | ORAL | 1 refills | Status: DC

## 2021-01-15 NOTE — Patient Instructions (Signed)
Edema  Edema is when you have too much fluid in your body or under your skin. Edema may make your legs, feet, and ankles swell up. Swelling is also common in looser tissues, like around your eyes. This is a common condition. It gets more common as you get older. There are many possible causes of edema. Eating too much salt (sodium) and being on your feet or sitting for a long time can cause edema in your legs, feet, and ankles. Hot weather may make edema worse. Edema is usually painless. Your skin may look swollen or shiny. Follow these instructions at home:  Keep the swollen body part raised (elevated) above the level of your heart when you are sitting or lying down.  Do not sit still or stand for a long time.  Do not wear tight clothes. Do not wear garters on your upper legs.  Exercise your legs. This can help the swelling go down.  Wear elastic bandages or support stockings as told by your doctor.  Eat a low-salt (low-sodium) diet to reduce fluid as told by your doctor.  Depending on the cause of your swelling, you may need to limit how much fluid you drink (fluid restriction).  Take over-the-counter and prescription medicines only as told by your doctor. Contact a doctor if:  Treatment is not working.  You have heart, liver, or kidney disease and have symptoms of edema.  You have sudden and unexplained weight gain. Get help right away if:  You have shortness of breath or chest pain.  You cannot breathe when you lie down.  You have pain, redness, or warmth in the swollen areas.  You have heart, liver, or kidney disease and get edema all of a sudden.  You have a fever and your symptoms get worse all of a sudden. Summary  Edema is when you have too much fluid in your body or under your skin.  Edema may make your legs, feet, and ankles swell up. Swelling is also common in looser tissues, like around your eyes.  Raise (elevate) the swollen body part above the level of your  heart when you are sitting or lying down.  Follow your doctor's instructions about diet and how much fluid you can drink (fluid restriction). This information is not intended to replace advice given to you by your health care provider. Make sure you discuss any questions you have with your health care provider. Document Revised: 09/10/2020 Document Reviewed: 09/10/2020 Elsevier Patient Education  2021 [employer]  

## 2021-01-15 NOTE — Addendum Note (Signed)
Addended by: Rolena Infante on: 01/15/2021 03:28 PM   Modules accepted: Orders

## 2021-01-15 NOTE — Progress Notes (Signed)
Subjective:    Patient ID: Debra Campbell, female    DOB: Apr 18, 1932, 87 y.o.   MRN: [id number]   Chief Complaint: Medical Management of Chronic Issues    HPI:  1. Type 2 diabetes mellitus with diabetic polyneuropathy, without long-term current use of insulin (HCC) Fasting blood sugars have been running 100-120. No low blood sugars. Lab Results  Component Value Date   HGBA1C 5.8 10/14/2020     2. Hypertension associated with type 2 diabetes mellitus ([place]) No c/o chest pain, sob or headache. does not check blood pressure at home. BP Readings from Last 3 Encounters:  01/15/21 126/67  10/14/20 114/69  07/14/20 (!) 113/56     3. Mixed hyperlipidemia She does not really watch diet and does no exercise. Lab Results  Component Value Date   CHOL 188 10/14/2020   HDL 65 10/14/2020   LDLCALC 105 (H) 10/14/2020   TRIG 100 10/14/2020   CHOLHDL 2.9 10/14/2020     4. Hypothyroidism due to acquired atrophy of thyroid No problems that she is aware of. Lab Results  Component Value Date   TSH 0.034 (L) 10/14/2020     5. Diabetic polyneuropathy associated with type 2 diabetes mellitus (HCC) Has constant numbness or tingling f he rfeet. Has burning on occasion.  6. Osteopenia of lumbar spine Last dexascan was done years ago. Says she will have one today  7. Peripheral edema Has daily. Tries to prop them up when she is sitting.   8. Morbid obesity ([place]) No recent weight changes Wt Readings from Last 3 Encounters:  01/15/21 198 lb (89.8 kg)  10/14/20 181 lb (82.1 kg)  07/14/20 181 lb (82.1 kg)   BMI Readings from Last 3 Encounters:  01/15/21 33.99 kg/m  10/14/20 31.07 kg/m  07/14/20 31.07 kg/m       Outpatient Encounter Medications as of 01/15/2021  Medication Sig  . Accu-Chek Softclix Lancets lancets Check BS once daily Dx E11.42  . acetaminophen (TYLENOL) 500 MG tablet Take 1 tablet (500 mg total) by mouth every 8 (eight) hours as needed.  . blood glucose  meter kit and supplies Dispense based on patient and insurance preference. Use up to four times daily as directed. (FOR ICD-10 E10.9, E11.9).  [name] gabapentin (NEURONTIN) 300 MG capsule Take 1 capsule (300 mg total) by mouth 2 (two) times daily.  [name] glucose blood (ACCU-CHEK GUIDE) test strip Check BS once daily Dx E11.42  . levothyroxine (SYNTHROID) 150 MCG tablet Take 1 tablet (150 mcg total) by mouth daily.  . metFORMIN (GLUCOPHAGE) 500 MG tablet Take 1 tablet (500 mg total) by mouth 2 (two) times daily with a meal.  . metoprolol succinate (TOPROL-XL) 25 MG 24 hr tablet Take 1 tablet (25 mg total) by mouth daily.  [name] nystatin cream (MYCOSTATIN) Apply 1 application topically 2 (two) times daily.  . rivaroxaban (XARELTO) 20 MG TABS tablet TAKE 1 TABLET EVERY DAY WITH BREAKFAST  . triamcinolone cream (KENALOG) 0.5 % APPLY 1 APPLICATION TOPICALLY THREE TIMES DAILY AS NEEDED.  . furosemide (LASIX) 40 MG tablet Take 2 tablets (80 mg total) by mouth 2 (two) times daily. MAY TAKE AN ADDITIONAL 40 MG IF WEIGHT GAIN OF 3 LBS OR MORE OVER NIGHT (Patient taking differently: Take 80 mg by mouth daily. MAY TAKE AN ADDITIONAL 40 MG IF WEIGHT GAIN OF 3 LBS OR MORE OVER NIGHT)   No facility-administered encounter medications on file as of 01/15/2021.    Past Surgical History:  Procedure Laterality Date  .  ABDOMINAL HYSTERECTOMY Bilateral 06/26/2013   Procedure: TOTAL ABDOMINAL HYSTERECTOMY WITH BILATERAL SALPINGO OOPHERECTOMY WITH PELVIC LYMPHADNECTOMY;  Surgeon: Alvino Chapel, MD;  Location: [facility];  Service: Gynecology;  Laterality: Bilateral;  . BACK SURGERY  06/26/2013  . ECTOPIC PREGNANCY SURGERY    . HERNIA REPAIR    . HYSTEROSCOPY WITH D & C N/A 04/26/2013   Procedure: DILATATION AND CURETTAGE /HYSTEROSCOPY;  Surgeon: Woodroe Mode, MD;  Location: [facility];  Service: Gynecology;  Laterality: N/A;  . JOINT REPLACEMENT    . LAPAROTOMY     for ectopic pregnancy  . LAPAROTOMY N/A 06/26/2013    Procedure: EXPLORATORY LAPAROTOMY;  Surgeon: Alvino Chapel, MD;  Location: [facility];  Service: Gynecology;  Laterality: N/A;  . lt knee arthroscopy    . rt total hip replacement    . TONSILLECTOMY    . TOTAL KNEE ARTHROPLASTY Left 12/22/2016   Procedure: LEFT TOTAL KNEE ARTHROPLASTY;  Surgeon: Latanya Maudlin, MD;  Location: [facility];  Service: Orthopedics;  Laterality: Left;  . TRANSTHORACIC ECHOCARDIOGRAM  09/2018   EF 60-65%. Mild AS.  Mod-Severe PAH 60 mmHg  . WISDOM TOOTH EXTRACTION      Family History  Problem Relation Age of Onset  . Heart disease Father   . Thyroid disease Father   . Heart attack Father   . Heart disease Sister        open heart surgery  . Cancer Sister        breast  . Neuropathy Sister        secondary to cancer treatment  . Breast cancer Sister   . Suicidality Brother 52  . Arthritis Mother   . Cancer Daughter   . Breast cancer Daughter     New complaints: None today  Social history: Lives with her husband- family chekcs on them daily  Controlled substance contract: n/a    Review of Systems  Constitutional: Negative for diaphoresis.  Eyes: Negative for pain.  Respiratory: Negative for shortness of breath.   Cardiovascular: Negative for chest pain, palpitations and leg swelling.  Gastrointestinal: Negative for abdominal pain.  Endocrine: Negative for polydipsia.  Musculoskeletal: Negative for back pain.  Skin: Negative for rash.  Neurological: Negative for dizziness, weakness and headaches.  Hematological: Does not bruise/bleed easily.  All other systems reviewed and are negative.      Objective:   Physical Exam Vitals and nursing note reviewed.  Constitutional:      General: She is not in acute distress.    Appearance: Normal appearance. She is well-developed and well-nourished.  HENT:     Head: Normocephalic.     Nose: Nose normal.     Mouth/Throat:     Mouth: Oropharynx is clear and moist.  Eyes:     Extraocular  Movements: EOM normal.     Pupils: Pupils are equal, round, and reactive to light.  Neck:     Vascular: No carotid bruit or JVD.  Cardiovascular:     Rate and Rhythm: Normal rate and regular rhythm.     Pulses: Intact distal pulses.     Heart sounds: Murmur (2/6 systolic) heard.    Pulmonary:     Effort: Pulmonary effort is normal. No respiratory distress.     Breath sounds: Normal breath sounds. No wheezing or rales.  Chest:     Chest wall: No tenderness.  Abdominal:     General: Bowel sounds are normal. There is no distension or abdominal bruit. Aorta is normal.  Palpations: Abdomen is soft. There is no hepatomegaly, splenomegaly, mass or pulsatile mass.     Tenderness: There is no abdominal tenderness.  Musculoskeletal:        General: Normal range of motion.     Cervical back: Normal range of motion and neck supple.     Right lower leg: Edema (2+) present.     Left lower leg: Edema (2+) present.     Comments: Walking with a walker  Lymphadenopathy:     Cervical: No cervical adenopathy.  Skin:    General: Skin is warm and dry.  Neurological:     Mental Status: She is alert and oriented to person, place, and time.     Deep Tendon Reflexes: Reflexes are normal and symmetric.  Psychiatric:        Mood and Affect: Mood and affect normal.        Behavior: Behavior normal.        Thought Content: Thought content normal.        Judgment: Judgment normal.    BP 126/67   Pulse 90   Temp 97.7 F (36.5 C) (Temporal)   Resp 20   Ht 5' 4"  (1.626 m)   Wt 198 lb (89.8 kg)   BMI 33.99 kg/m         Assessment & Plan:  MAJA MCCAFFERY comes in today with chief complaint of Medical Management of Chronic Issues   Diagnosis and orders addressed:  1. Hypertension associated with type 2 diabetes mellitus (HCC) Low sodium diet - CBC with Differential/Platelet - CMP14+EGFR - metoprolol succinate (TOPROL-XL) 25 MG 24 hr tablet; Take 1 tablet (25 mg total) by mouth daily.   Dispense: 90 tablet; Refill: 1 - CBC with Differential/Platelet - CMP14+EGFR  2. Type 2 diabetes mellitus with diabetic polyneuropathy, without long-term current use of insulin (HCC) Watch carbs in diet - Bayer DCA Hb A1c Waived - Microalbumin / creatinine urine ratio - metFORMIN (GLUCOPHAGE) 500 MG tablet; Take 1 tablet (500 mg total) by mouth 2 (two) times daily with a meal.  Dispense: 180 tablet; Refill: 1  3. Mixed hyperlipidemia Low fat diet - Lipid panel - Lipid panel  4. Hypothyroidism due to acquired atrophy of thyroid - Thyroid Panel With TSH - levothyroxine (SYNTHROID) 150 MCG tablet; Take 1 tablet (150 mcg total) by mouth daily.  Dispense: 90 tablet; Refill: 1 - Thyroid Panel With TSH  5. Diabetic polyneuropathy associated with type 2 diabetes mellitus ([place]) Do not go barefooted Have someone check your feet daily - gabapentin (NEURONTIN) 300 MG capsule; Take 1 capsule (300 mg total) by mouth 2 (two) times daily.  Dispense: 180 capsule; Refill: 1  6. Osteopenia of lumbar spine dexascan done today  7. Peripheral edema Elevate legs when sitting - furosemide (LASIX) 40 MG tablet; Take 2 tablets (80 mg total) by mouth 2 (two) times daily. MAY TAKE AN ADDITIONAL 40 MG IF WEIGHT GAIN OF 3 LBS OR MORE OVER NIGHT  Dispense: 360 tablet; Refill: 1  8. Morbid obesity ([place]) Discussed diet and exercise for person with BMI >25 Will recheck weight in 3-6 months  9. Permanent atrial fibrillation (HCC) - rivaroxaban (XARELTO) 20 MG TABS tablet; TAKE 1 TABLET EVERY DAY WITH BREAKFAST  Dispense: 90 tablet; Refill: 1   Labs pending Health Maintenance reviewed Diet and exercise encouraged  Follow up plan: 3 months   [name] Hassell Done, FNP

## 2021-01-16 LAB — CBC WITH DIFFERENTIAL/PLATELET
Basophils Absolute: 0.1 10*3/uL (ref 0.0–0.2)
Basos: 1 %
EOS (ABSOLUTE): 0.3 10*3/uL (ref 0.0–0.4)
Eos: 7 %
Hematocrit: 37.2 % (ref 34.0–46.6)
Hemoglobin: 12.4 g/dL (ref 11.1–15.9)
Immature Grans (Abs): 0 10*3/uL (ref 0.0–0.1)
Immature Granulocytes: 0 %
Lymphocytes Absolute: 1.6 10*3/uL (ref 0.7–3.1)
Lymphs: 39 %
MCH: 30.5 pg (ref 26.6–33.0)
MCHC: 33.3 g/dL (ref 31.5–35.7)
MCV: 91 fL (ref 79–97)
Monocytes Absolute: 0.4 10*3/uL (ref 0.1–0.9)
Monocytes: 11 %
Neutrophils Absolute: 1.7 10*3/uL (ref 1.4–7.0)
Neutrophils: 42 %
Platelets: 162 10*3/uL (ref 150–450)
RBC: 4.07 x10E6/uL (ref 3.77–5.28)
RDW: 13.7 % (ref 11.7–15.4)
WBC: 4 10*3/uL (ref 3.4–10.8)

## 2021-01-16 LAB — CMP14+EGFR
ALT: 13 IU/L (ref 0–32)
AST: 21 IU/L (ref 0–40)
Albumin/Globulin Ratio: 1.6 (ref 1.2–2.2)
Albumin: 4 g/dL (ref 3.6–4.6)
Alkaline Phosphatase: 60 IU/L (ref 44–121)
BUN/Creatinine Ratio: 18 (ref 12–28)
BUN: 17 mg/dL (ref 8–27)
Bilirubin Total: 0.7 mg/dL (ref 0.0–1.2)
CO2: 20 mmol/L (ref 20–29)
Calcium: 9.4 mg/dL (ref 8.7–10.3)
Chloride: 104 mmol/L (ref 96–106)
Creatinine, Ser: 0.95 mg/dL (ref 0.57–1.00)
GFR calc Af Amer: 63 mL/min/{1.73_m2} (ref >59)
GFR calc non Af Amer: 54 mL/min/{1.73_m2} — ABNORMAL LOW (ref >59)
Globulin, Total: 2.5 g/dL (ref 1.5–4.5)
Glucose: 129 mg/dL — ABNORMAL HIGH (ref 65–99)
Potassium: 4.6 mmol/L (ref 3.5–5.2)
Sodium: 142 mmol/L (ref 134–144)
Total Protein: 6.5 g/dL (ref 6.0–8.5)

## 2021-01-16 LAB — LIPID PANEL
Chol/HDL Ratio: 2.9 ratio (ref 0.0–4.4)
Cholesterol, Total: 186 mg/dL (ref 100–199)
HDL: 65 mg/dL (ref >39)
LDL Chol Calc (NIH): 101 mg/dL — ABNORMAL HIGH (ref 0–99)
Triglycerides: 112 mg/dL (ref 0–149)
VLDL Cholesterol Cal: 20 mg/dL (ref 5–40)

## 2021-01-16 LAB — THYROID PANEL WITH TSH
Free Thyroxine Index: 2.4 (ref 1.2–4.9)
T3 Uptake Ratio: 30 % (ref 24–39)
T4, Total: 7.9 ug/dL (ref 4.5–12.0)
TSH: 0.277 u[IU]/mL — ABNORMAL LOW (ref 0.450–4.500)

## 2021-01-16 LAB — MICROALBUMIN / CREATININE URINE RATIO
Creatinine, Urine: 122.2 mg/dL
Microalb/Creat Ratio: 6 mg/g creat (ref 0–29)
Microalbumin, Urine: 7.9 ug/mL

## 2021-01-16 MED ORDER — LEVOTHYROXINE SODIUM 125 MCG PO TABS
125.0000 ug | ORAL_TABLET | Freq: Every day | ORAL | 3 refills | Status: DC
Start: 2021-01-16 — End: 2021-04-14

## 2021-01-16 NOTE — Addendum Note (Signed)
Addended by: Chevis Pretty on: 01/16/2021 05:22 PM   Modules accepted: Orders

## 2021-01-21 DIAGNOSIS — Z780 Asymptomatic menopausal state: Secondary | ICD-10-CM | POA: Diagnosis not present

## 2021-01-21 DIAGNOSIS — M85832 Other specified disorders of bone density and structure, left forearm: Secondary | ICD-10-CM | POA: Diagnosis not present

## 2021-01-23 ENCOUNTER — Telehealth: Payer: Self-pay

## 2021-01-23 NOTE — Telephone Encounter (Signed)
Call to [facility], they did receive refill, but it is too early, it can & will be filled on 01/29/21 Left message with husband

## 2021-01-30 ENCOUNTER — Ambulatory Visit (INDEPENDENT_AMBULATORY_CARE_PROVIDER_SITE_OTHER): Payer: Medicare Other | Admitting: Cardiology

## 2021-01-30 ENCOUNTER — Other Ambulatory Visit: Payer: Self-pay

## 2021-01-30 ENCOUNTER — Encounter: Payer: Self-pay | Admitting: Cardiology

## 2021-01-30 VITALS — BP 144/77 | HR 82 | Ht 64.0 in | Wt 199.4 lb

## 2021-01-30 DIAGNOSIS — I4821 Permanent atrial fibrillation: Secondary | ICD-10-CM

## 2021-01-30 DIAGNOSIS — E1169 Type 2 diabetes mellitus with other specified complication: Secondary | ICD-10-CM | POA: Diagnosis not present

## 2021-01-30 DIAGNOSIS — I272 Pulmonary hypertension, unspecified: Secondary | ICD-10-CM | POA: Diagnosis not present

## 2021-01-30 DIAGNOSIS — R609 Edema, unspecified: Secondary | ICD-10-CM | POA: Diagnosis not present

## 2021-01-30 DIAGNOSIS — I700 Atherosclerosis of aorta: Secondary | ICD-10-CM

## 2021-01-30 DIAGNOSIS — E1159 Type 2 diabetes mellitus with other circulatory complications: Secondary | ICD-10-CM

## 2021-01-30 DIAGNOSIS — I350 Nonrheumatic aortic (valve) stenosis: Secondary | ICD-10-CM | POA: Diagnosis not present

## 2021-01-30 DIAGNOSIS — E785 Hyperlipidemia, unspecified: Secondary | ICD-10-CM

## 2021-01-30 DIAGNOSIS — I152 Hypertension secondary to endocrine disorders: Secondary | ICD-10-CM | POA: Diagnosis not present

## 2021-01-30 MED ORDER — RIVAROXABAN 20 MG PO TABS: ORAL_TABLET | ORAL | 3 refills | Status: DC

## 2021-01-30 NOTE — Patient Instructions (Signed)
Medication Instructions:  ?Not needed ? ?*If you need a refill on your cardiac medications before your next appointment, please call your pharmacy* ? ? ?Lab Work: ?Not needed ? ? ? ?Testing/Procedures: ? ?Not needed ? ?Follow-Up: ?At [facility], you and your health needs are our priority.  As part of our continuing mission to provide you with exceptional heart care, we have created designated Provider Care Teams.  These Care Teams include your primary Cardiologist (physician) and Advanced Practice Providers (APPs -  Physician Assistants and Nurse Practitioners) who all work together to provide you with the care you need, when you need it. ? ?  ? ?Your next appointment:   ?12 month(s) ? ?The format for your next appointment:   ?In Person ? ?Provider:   ?David Harding, MD  ? ?

## 2021-01-30 NOTE — Progress Notes (Signed)
Primary Care Provider: Chevis Pretty, FNP Cardiologist: Glenetta Hew, MD Electrophysiologist: None  Clinic Note: Chief Complaint  Patient presents with  . Atrial Fibrillation    Only notes irregular heartbeat when she is excited or exerting herself  . Follow-up    Annual   ===================================  ASSESSMENT/PLAN   Problem List Items Addressed This Visit    Hypertension associated with type 2 diabetes mellitus (HCC) (Chronic)    Blood pressure is up today, this is not her norm though.   She is on Toprol 25 mg daily which we could increase if necessary.  Otherwise would prefer to avoid adding new medication.  She has had issues with orthostasis last visit so we DC'd losartan. With her advanced age, I would prefer to be a little less aggressive and allow mild permissive hypertension.      Relevant Medications   rivaroxaban (XARELTO) 20 MG TABS tablet   Hyperlipidemia associated with type 2 diabetes mellitus ([place]) (Chronic)    Labs being followed by PCP.  She is actually not on any lipid control agent.  As of recent check, LDL was 101.  I think we are okay continuing to monitor without medication.      Morbid obesity ([place]) (Chronic)    Unfortunately, she has gained weight.  This probably has to do with the fact that she is sedentary.  Continue to encourage some type of exercise and dietary modification.      Permanent atrial fibrillation (HCC) CHA2DS2Vasc = 5; Xarelto - Primary (Chronic)    Permanent rate controlled A. fib.  On low-dose beta-blocker.  She is relatively asymptomatic-only noticing it when her heart rate goes faster.  Failed cardioversion in the past, no plans to be tried.  On Xarelto for anticoagulation.  No bleeding.      Relevant Medications   rivaroxaban (XARELTO) 20 MG TABS tablet   Other Relevant Orders   EKG 12-Lead (Completed)   Aortic calcification (HCC) (Chronic)    Aortic plaque, not unexpected in a 87 year old.  Denies  increased risk for stroke with CHA2DS2-VASc score.  No evidence of stenosis or aneurysmal dilation.  Continue blood pressure control and treatment of lipids with statin.      Relevant Medications   rivaroxaban (XARELTO) 20 MG TABS tablet   Peripheral edema (Chronic)    Probably mostly related to venous stasis and even potentially lymphedema.  Does not seem to be related to heart failure. She is on stable dose of diuretic.  She is not willing to try wearing support stockings so I continue to recommend elevation.  I did mention using Ace wraps.      Pulmonary hypertension, unspecified (HCC) (Chronic)   Relevant Medications   rivaroxaban (XARELTO) 20 MG TABS tablet   Mild aortic stenosis by prior echocardiogram (Chronic)    Mild stenosis.  Unless the murmur gets worse, or symptoms warrant, would not recheck an echo.      Relevant Medications   rivaroxaban (XARELTO) 20 MG TABS tablet   Other Relevant Orders   EKG 12-Lead (Completed)      ===================================  HPI:    Debra Campbell is a 87 y.o. female with a PMH below who presents today for annual follow-up of atrial fibrillation.  Debra Campbell was last seen on January 28, 2020 for 61-monthfollow-up.  She is doing very well.  Did not notice being A. fib.  She notes that her heart rate goes up if she does more cardiac type exercise, but every  now and then then will go up for no reason.  Nothing is prolonged.  Exertional dyspnea because of deconditioning,-gets short of breath with exertion, but no PND or orthopnea.  She does have some edema, and often forgets to elevate her legs.  She does not tolerate support stockings. => No changes made  Recent Hospitalizations: None  Reviewed  CV studies:    The following studies were reviewed today: (if available, images/films reviewed: From Epic Chart or Care Everywhere) . None:  Interval History:   Debra Campbell returns here for annual follow-up stating that she is  doing about the same.  She really only notes heart fluttering sensations if she is upset or exerting herself so that her heart rate goes fast.  She does not really do much, she walks when she has to, and uses a cane in the house, but if she goes any further distance you are to use a walker.  But she is just very much limited by back hip and knee pain.  As such, she is essentially sedentary. She does have edema that is pretty scaly.  She does not like to wear the support stockings because they are very uncomfortable to her to put on.  Part of it is that she cannot reach her feet to put them on.  She does say that the swelling goes down when she elevates her feet and uses her Lasix.  She is dizzy with bending over, but not necessarily standing back up again.  CV Review of Symptoms (Summary): positive for - Dizziness, mostly positional.  Only notes flutters when she is excited or upset.  Chronic swelling, unable to tolerate support stockings.  Swelling does go down if she puts her feet up.  She has exertional dyspnea mostly because of deconditioning. negative for - chest pain, irregular heartbeat, orthopnea, paroxysmal nocturnal dyspnea, rapid heart rate, shortness of breath or Syncope or near syncope, she does get lightheaded with bending over but has not had since the passing out.  TIA/amaurosis fugax; does not walk enough to note claudication   The patient does not have symptoms concerning for COVID-19 infection (fever, chills, cough, or new shortness of breath).   REVIEWED OF SYSTEMS   Review of Systems  Constitutional: Positive for malaise/fatigue (Lack of energy.  Mostly deconditioning.  Does not do much.  Limited by unsteady gait.). Negative for weight loss (Gained weight).  HENT: Negative for congestion.   Respiratory: Positive for cough and shortness of breath (With exertion).   Cardiovascular: Positive for leg swelling.  Musculoskeletal: Positive for back pain and joint pain (Bilateral hip  and knee pain; limits walking.).       She now uses a walker to walk anywhere outside of the house.  Neurological: Positive for dizziness (With bending over, not necessarily orthostatic) and weakness (Legs feel weak).  Psychiatric/Behavioral: Positive for memory loss (Mild). The patient is not nervous/anxious and does not have insomnia.     I have reviewed and (if needed) personally updated the patient's problem list, medications, allergies, past medical and surgical history, social and family history.   PAST MEDICAL HISTORY   Past Medical History:  Diagnosis Date  . Aortic calcification ([place]) 2013   Atherosclerotic calcifications of the abdominal aorta and branch noted on CT Abd/Pelvis  . Arthritis    legs, lower back, hands  . Cancer ([place])    endometrial  . Diabetes mellitus   . Ectopic pregnancy   . Eczema   . Headache(784.0)  hx of  . History of kidney stones   . Hyperlipidemia    no meds  . Hypertension   . Hypothyroidism   . Persistent atrial fibrillation ([place]) 09/28/2016   Relatively new Dx: Asymptomatic.  Rate controlled without medication.  . SVD (spontaneous vaginal delivery)    x 3  . Thyroid disease     PAST SURGICAL HISTORY   Past Surgical History:  Procedure Laterality Date  . ABDOMINAL HYSTERECTOMY Bilateral 06/26/2013   Procedure: TOTAL ABDOMINAL HYSTERECTOMY WITH BILATERAL SALPINGO OOPHERECTOMY WITH PELVIC LYMPHADNECTOMY;  Surgeon: Alvino Chapel, MD;  Location: [facility];  Service: Gynecology;  Laterality: Bilateral;  . BACK SURGERY  06/26/2013  . ECTOPIC PREGNANCY SURGERY    . HERNIA REPAIR    . HYSTEROSCOPY WITH D & C N/A 04/26/2013   Procedure: DILATATION AND CURETTAGE /HYSTEROSCOPY;  Surgeon: Woodroe Mode, MD;  Location: [facility];  Service: Gynecology;  Laterality: N/A;  . JOINT REPLACEMENT    . LAPAROTOMY     for ectopic pregnancy  . LAPAROTOMY N/A 06/26/2013   Procedure: EXPLORATORY LAPAROTOMY;  Surgeon: Alvino Chapel, MD;   Location: [facility];  Service: Gynecology;  Laterality: N/A;  . lt knee arthroscopy    . rt total hip replacement    . TONSILLECTOMY    . TOTAL KNEE ARTHROPLASTY Left 12/22/2016   Procedure: LEFT TOTAL KNEE ARTHROPLASTY;  Surgeon: Latanya Maudlin, MD;  Location: [facility];  Service: Orthopedics;  Laterality: Left;  . TRANSTHORACIC ECHOCARDIOGRAM  09/2018   EF 60-65%. Mild AS.  Mod-Severe PAH 60 mmHg  . WISDOM TOOTH EXTRACTION      Immunization History  Administered Date(s) Administered  . Fluad Quad(high Dose 65+) 08/29/2019, 10/14/2020  . Influenza, High Dose Seasonal PF 09/28/2016, 10/06/2017, 10/17/2018  . Influenza,inj,Quad PF,6+ Mos 09/26/2013, 09/09/2014, 10/13/2015  . Moderna Sars-Covid-2 Vaccination 12/22/2019, 01/19/2020, 11/10/2020  . Pneumococcal Conjugate-13 05/01/2015  . Pneumococcal Polysaccharide-23 03/29/2002  . Td 10/29/2008, 08/31/2011  . Tdap 08/31/2011  . Zoster Recombinat (Shingrix) 10/30/2018, 01/15/2021    MEDICATIONS/ALLERGIES   Current Meds  Medication Sig  . Accu-Chek Softclix Lancets lancets Check BS once daily Dx E11.42  . acetaminophen (TYLENOL) 500 MG tablet Take 1 tablet (500 mg total) by mouth every 8 (eight) hours as needed.  . blood glucose meter kit and supplies Dispense based on patient and insurance preference. Use up to four times daily as directed. (FOR ICD-10 E10.9, E11.9).  . furosemide (LASIX) 40 MG tablet Take 2 tablets (80 mg total) by mouth 2 (two) times daily. MAY TAKE AN ADDITIONAL 40 MG IF WEIGHT GAIN OF 3 LBS OR MORE OVER NIGHT  . gabapentin (NEURONTIN) 300 MG capsule Take 1 capsule (300 mg total) by mouth 2 (two) times daily.  [name] glucose blood (ACCU-CHEK GUIDE) test strip Check BS once daily Dx E11.42  . levothyroxine (SYNTHROID) 125 MCG tablet Take 1 tablet (125 mcg total) by mouth daily.  . metFORMIN (GLUCOPHAGE) 500 MG tablet Take 1 tablet (500 mg total) by mouth 2 (two) times daily with a meal.  . metoprolol succinate (TOPROL-XL) 25 MG  24 hr tablet Take 1 tablet (25 mg total) by mouth daily.  [name] nystatin cream (MYCOSTATIN) Apply 1 application topically 2 (two) times daily.  [name] triamcinolone cream (KENALOG) 0.5 % APPLY 1 APPLICATION TOPICALLY THREE TIMES DAILY AS NEEDED.  . [DISCONTINUED] rivaroxaban (XARELTO) 20 MG TABS tablet TAKE 1 TABLET EVERY DAY WITH BREAKFAST    Allergies  Allergen Reactions  . Ace Inhibitors Cough  .  Iohexol      Desc: HIVES 40 YEARS AGO   . Statins     myalgia  . Penicillins Swelling and Rash    Has patient had a PCN reaction causing immediate rash, facial/tongue/throat swelling, SOB or lightheadedness with hypotension: Yes Has patient had a PCN reaction causing severe rash involving mucus membranes or skin necrosis: No Has patient had a PCN reaction that required hospitalization Yes Has patient had a PCN reaction occurring within the last 10 years: No If all of the above answers are "NO", then may proceed with Cephalosporin use.   . Sulfa Antibiotics Rash    SOCIAL HISTORY/FAMILY HISTORY   Reviewed in Epic:  Pertinent findings:  Social History   Tobacco Use  . Smoking status: Never Smoker  . Smokeless tobacco: Never Used  Vaping Use  . Vaping Use: Never used  Substance Use Topics  . Alcohol use: No  . Drug use: No   Social History   Social History Narrative  . Not on file    OBJCTIVE -PE, EKG, labs   Wt Readings from Last 3 Encounters:  01/30/21 199 lb 6.4 oz (90.4 kg)  01/15/21 198 lb (89.8 kg)  10/14/20 181 lb (82.1 kg)    Physical Exam: BP (!) 144/77   Pulse 82   Ht _0  (1.626 m)   Wt 199 lb 6.4 oz (90.4 kg)   SpO2 98%   BMI 34.23 kg/m  Physical Exam Vitals reviewed.  Constitutional:      General: She is not in acute distress.    Appearance: Normal appearance. She is obese. She is not ill-appearing (Healthy-appearing) or toxic-appearing.     Comments: Well-groomed.  HENT:     Head: Normocephalic and atraumatic.  Neck:     Vascular: No carotid bruit,  hepatojugular reflux or JVD (JVP roughly 7 cm water.  Not distended).  Cardiovascular:     Rate and Rhythm: Normal rate. Rhythm irregularly irregular.  No extrasystoles are present.    Chest Wall: PMI is not displaced.     Pulses: Decreased pulses (Decreased but palpable due to edema.).     Heart sounds: S1 normal and S2 normal. Heart sounds are distant. Murmur (Soft 1/6 SEM at RUSB.) heard.  No gallop.   Pulmonary:     Effort: Pulmonary effort is normal. No respiratory distress.     Breath sounds: Normal breath sounds.  Chest:     Chest wall: No tenderness.  Musculoskeletal:        General: Swelling (Roughly 1-2+ bilateral LE edema.) present.     Cervical back: Normal range of motion and neck supple.  Skin:    General: Skin is warm and dry.  Neurological:     General: No focal deficit present.     Mental Status: She is alert and oriented to person, place, and time.     Gait: Gait abnormal (Slow wobbly gait, uses walker or cane now.).  Psychiatric:        Mood and Affect: Mood normal.        Behavior: Behavior normal.        Thought Content: Thought content normal.        Judgment: Judgment normal.      Adult ECG Report  Rate: 77 ;  Rhythm: atrial fibrillation and Normal axis, intervals and durations.;  Low voltage  Narrative Interpretation: Stable  Recent Labs: Reviewed Lab Results  Component Value Date   CHOL 186 01/15/2021   HDL 65 01/15/2021  LDLCALC 101 (H) 01/15/2021   TRIG 112 01/15/2021   CHOLHDL 2.9 01/15/2021   Lab Results  Component Value Date   CREATININE 0.95 01/15/2021   BUN 17 01/15/2021   NA 142 01/15/2021   K 4.6 01/15/2021   CL 104 01/15/2021   CO2 20 01/15/2021   CBC Latest Ref Rng & Units 01/15/2021 10/14/2020 07/09/2020  WBC 3.4 - 10.8 x10E3/uL 4.0 5.6 4.5  Hemoglobin 11.1 - 15.9 g/dL 12.4 14.1 13.4  Hematocrit 34.0 - 46.6 % 37.2 41.6 40.1  Platelets 150 - 450 x10E3/uL 162 179 203    Lab Results  Component Value Date   TSH 0.277 (L)  01/15/2021    ==================================================  COVID-19 Education: The signs and symptoms of COVID-19 were discussed with the patient and how to seek care for testing (follow up with PCP or arrange E-visit).   The importance of social distancing and COVID-19 vaccination was discussed today. The patient is practicing social distancing & Masking.   I spent a total of 44 minutes with the patient spent in direct patient consultation.  -> Debra Campbell is a somewhat poor historian.  She takes a long time to answer questions and provide a reasonable answer.  She likes to joke and get sidetracked with different topics.  Her niece is with her and she usually is able to redirect her. Additional time spent with chart review  / charting (studies, outside notes, etc): 12 min Total Time: 56 min   Current medicines are reviewed at length with the patient today.  (+/- concerns) none  This visit occurred during the SARS-CoV-2 public health emergency.  Safety protocols were in place, including screening questions prior to the visit, additional usage of staff PPE, and extensive cleaning of exam room while observing appropriate contact time as indicated for disinfecting solutions.  Notice: This dictation was prepared with Dragon dictation along with smaller phrase technology. Any transcriptional errors that result from this process are unintentional and may not be corrected upon review.  Patient Instructions / Medication Changes & Studies & Tests Ordered   Patient Instructions  Medication Instructions:  Not needed *If you need a refill on your cardiac medications before your next appointment, please call your pharmacy*   Lab Work: Not needed   Testing/Procedures: Not needed   Follow-Up: At [facility], you and your health needs are our priority.  As part of our continuing mission to provide you with exceptional heart care, we have created designated Provider Care Teams.  These Care  Teams include your primary Cardiologist (physician) and Advanced Practice Providers (APPs -  Physician Assistants and Nurse Practitioners) who all work together to provide you with the care you need, when you need it.     Your next appointment:   12 month(s)  The format for your next appointment:   In Person  Provider:   Glenetta Hew, MD      Studies Ordered:   Orders Placed This Encounter  Procedures  . EKG 12-Lead     Glenetta Hew, M.D., M.S. Interventional Cardiologist   Pager # [phone] Phone # [phone] [address] [place], [place] [address]   Thank you for choosing Heartcare at [facility]!!

## 2021-02-22 ENCOUNTER — Encounter: Payer: Self-pay | Admitting: Cardiology

## 2021-02-22 NOTE — Assessment & Plan Note (Signed)
Labs being followed by PCP.  She is actually not on any lipid control agent.  As of recent check, LDL was 101.  I think we are okay continuing to monitor without medication.

## 2021-02-22 NOTE — Assessment & Plan Note (Signed)
Unfortunately, she has gained weight.  This probably has to do with the fact that she is sedentary.  Continue to encourage some type of exercise and dietary modification.

## 2021-02-22 NOTE — Assessment & Plan Note (Signed)
Aortic plaque, not unexpected in a 87 year old.  Denies increased risk for stroke with CHA2DS2-VASc score.  No evidence of stenosis or aneurysmal dilation.  Continue blood pressure control and treatment of lipids with statin.

## 2021-02-22 NOTE — Assessment & Plan Note (Signed)
Probably mostly related to venous stasis and even potentially lymphedema.  Does not seem to be related to heart failure. She is on stable dose of diuretic.  She is not willing to try wearing support stockings so I continue to recommend elevation.  I did mention using Ace wraps.

## 2021-02-22 NOTE — Assessment & Plan Note (Signed)
Mild stenosis.  Unless the murmur gets worse, or symptoms warrant, would not recheck an echo.

## 2021-02-22 NOTE — Assessment & Plan Note (Signed)
Blood pressure is up today, this is not her norm though.   She is on Toprol 25 mg daily which we could increase if necessary.  Otherwise would prefer to avoid adding new medication.  She has had issues with orthostasis last visit so we DC'd losartan. With her advanced age, I would prefer to be a little less aggressive and allow mild permissive hypertension.

## 2021-02-22 NOTE — Assessment & Plan Note (Signed)
Permanent rate controlled A. fib.  On low-dose beta-blocker.  She is relatively asymptomatic-only noticing it when her heart rate goes faster.  Failed cardioversion in the past, no plans to be tried.  On Xarelto for anticoagulation.  No bleeding.

## 2021-04-14 ENCOUNTER — Ambulatory Visit (INDEPENDENT_AMBULATORY_CARE_PROVIDER_SITE_OTHER): Payer: Medicare Other | Admitting: Nurse Practitioner

## 2021-04-14 ENCOUNTER — Encounter: Payer: Self-pay | Admitting: Nurse Practitioner

## 2021-04-14 ENCOUNTER — Other Ambulatory Visit: Payer: Self-pay

## 2021-04-14 VITALS — BP 132/70 | HR 90 | Temp 97.6°F | Resp 20 | Ht 64.0 in | Wt 197.0 lb

## 2021-04-14 DIAGNOSIS — I152 Hypertension secondary to endocrine disorders: Secondary | ICD-10-CM | POA: Diagnosis not present

## 2021-04-14 DIAGNOSIS — E1142 Type 2 diabetes mellitus with diabetic polyneuropathy: Secondary | ICD-10-CM | POA: Diagnosis not present

## 2021-04-14 DIAGNOSIS — E785 Hyperlipidemia, unspecified: Secondary | ICD-10-CM

## 2021-04-14 DIAGNOSIS — E034 Atrophy of thyroid (acquired): Secondary | ICD-10-CM

## 2021-04-14 DIAGNOSIS — E1159 Type 2 diabetes mellitus with other circulatory complications: Secondary | ICD-10-CM

## 2021-04-14 DIAGNOSIS — C541 Malignant neoplasm of endometrium: Secondary | ICD-10-CM

## 2021-04-14 DIAGNOSIS — I4821 Permanent atrial fibrillation: Secondary | ICD-10-CM | POA: Diagnosis not present

## 2021-04-14 DIAGNOSIS — R609 Edema, unspecified: Secondary | ICD-10-CM | POA: Diagnosis not present

## 2021-04-14 DIAGNOSIS — E1169 Type 2 diabetes mellitus with other specified complication: Secondary | ICD-10-CM

## 2021-04-14 DIAGNOSIS — I700 Atherosclerosis of aorta: Secondary | ICD-10-CM | POA: Diagnosis not present

## 2021-04-14 LAB — BAYER DCA HB A1C WAIVED: HB A1C (BAYER DCA - WAIVED): 5.9 % (ref <7.0)

## 2021-04-14 MED ORDER — FUROSEMIDE 40 MG PO TABS: 80.0000 mg | ORAL_TABLET | Freq: Two times a day (BID) | ORAL | 1 refills | Status: DC

## 2021-04-14 MED ORDER — GABAPENTIN 300 MG PO CAPS: 300.0000 mg | ORAL_CAPSULE | Freq: Two times a day (BID) | ORAL | 1 refills | Status: DC

## 2021-04-14 MED ORDER — LEVOTHYROXINE SODIUM 125 MCG PO TABS: 125.0000 ug | ORAL_TABLET | Freq: Every day | ORAL | 1 refills | Status: DC

## 2021-04-14 MED ORDER — RIVAROXABAN 20 MG PO TABS: ORAL_TABLET | ORAL | 1 refills | Status: DC

## 2021-04-14 MED ORDER — METOPROLOL SUCCINATE ER 25 MG PO TB24: 25.0000 mg | ORAL_TABLET | Freq: Every day | ORAL | 1 refills | Status: DC

## 2021-04-14 MED ORDER — METFORMIN HCL 500 MG PO TABS: 500.0000 mg | ORAL_TABLET | Freq: Two times a day (BID) | ORAL | 1 refills | Status: DC

## 2021-04-14 NOTE — Patient Instructions (Signed)
Diabetes Mellitus and Foot Care Foot care is an important part of your health, especially when you have diabetes. Diabetes may cause you to have problems because of poor blood flow (circulation) to your feet and legs, which can cause your skin to:  Become thinner and drier.  Break more easily.  Heal more slowly.  Peel and crack. You may also have nerve damage (neuropathy) in your legs and feet, causing decreased feeling in them. This means that you may not notice minor injuries to your feet that could lead to more serious problems. Noticing and addressing any potential problems early is the best way to prevent future foot problems. How to care for your feet Foot hygiene  Wash your feet daily with warm water and mild soap. Do not use hot water. Then, pat your feet and the areas between your toes until they are completely dry. Do not soak your feet as this can dry your skin.  Trim your toenails straight across. Do not dig under them or around the cuticle. File the edges of your nails with an emery board or nail file.  Apply a moisturizing lotion or petroleum jelly to the skin on your feet and to dry, brittle toenails. Use lotion that does not contain alcohol and is unscented. Do not apply lotion between your toes.   Shoes and socks  Wear clean socks or stockings every day. Make sure they are not too tight. Do not wear knee-high stockings since they may decrease blood flow to your legs.  Wear shoes that fit properly and have enough cushioning. Always look in your shoes before you put them on to be sure there are no objects inside.  To break in new shoes, wear them for just a few hours a day. This prevents injuries on your feet. Wounds, scrapes, corns, and calluses  Check your feet daily for blisters, cuts, bruises, sores, and redness. If you cannot see the bottom of your feet, use a mirror or ask someone for help.  Do not cut corns or calluses or try to remove them with medicine.  If you  find a minor scrape, cut, or break in the skin on your feet, keep it and the skin around it clean and dry. You may clean these areas with mild soap and water. Do not clean the area with peroxide, alcohol, or iodine.  If you have a wound, scrape, corn, or callus on your foot, look at it several times a day to make sure it is healing and not infected. Check for: ? Redness, swelling, or pain. ? Fluid or blood. ? Warmth. ? Pus or a bad smell.   General tips  Do not cross your legs. This may decrease blood flow to your feet.  Do not use heating pads or hot water bottles on your feet. They may burn your skin. If you have lost feeling in your feet or legs, you may not know this is happening until it is too late.  Protect your feet from hot and cold by wearing shoes, such as at the beach or on hot pavement.  Schedule a complete foot exam at least once a year (annually) or more often if you have foot problems. Report any cuts, sores, or bruises to your health care provider immediately. Where to find more information  American Diabetes Association: www.diabetes.org  Association of Diabetes Care & Education Specialists: www.diabeteseducator.org Contact a health care provider if:  You have a medical condition that increases your risk of infection and   you have any cuts, sores, or bruises on your feet.  You have an injury that is not healing.  You have redness on your legs or feet.  You feel burning or tingling in your legs or feet.  You have pain or cramps in your legs and feet.  Your legs or feet are numb.  Your feet always feel cold.  You have pain around any toenails. Get help right away if:  You have a wound, scrape, corn, or callus on your foot and: ? You have pain, swelling, or redness that gets worse. ? You have fluid or blood coming from the wound, scrape, corn, or callus. ? Your wound, scrape, corn, or callus feels warm to the touch. ? You have pus or a bad smell coming from  the wound, scrape, corn, or callus. ? You have a fever. ? You have a red line going up your leg. Summary  Check your feet every day for blisters, cuts, bruises, sores, and redness.  Apply a moisturizing lotion or petroleum jelly to the skin on your feet and to dry, brittle toenails.  Wear shoes that fit properly and have enough cushioning.  If you have foot problems, report any cuts, sores, or bruises to your health care provider immediately.  Schedule a complete foot exam at least once a year (annually) or more often if you have foot problems. This information is not intended to replace advice given to you by your health care provider. Make sure you discuss any questions you have with your health care provider. Document Revised: 06/05/2020 Document Reviewed: 06/05/2020 Elsevier Patient Education  2021 [employer]  

## 2021-04-14 NOTE — Progress Notes (Signed)
Subjective:    Patient ID: Debra Campbell, female    DOB: September 02, 1932, 87 y.o.   MRN: [id number]   Chief Complaint: Medical Management of Chronic Issues    HPI:  1. Hypertension associated with type 2 diabetes mellitus ([place]) No c/o chest pain, sob or headache. Does not check her blood pressure at ome. BP Readings from Last 3 Encounters:  04/14/21 (!) 145/72  01/30/21 (!) 144/77  01/15/21 126/67     2. Hyperlipidemia associated with type 2 diabetes mellitus ([place]) Does not watch diet and does no exercise.  Lab Results  Component Value Date   CHOL 186 01/15/2021   HDL 65 01/15/2021   LDLCALC 101 (H) 01/15/2021   TRIG 112 01/15/2021   CHOLHDL 2.9 01/15/2021     3. Type 2 diabetes mellitus with diabetic polyneuropathy, without long-term current use of insulin (HCC) Fasting blood sugars are running around 120-130. denies any low blood sugars. Lab Results  Component Value Date   HGBA1C 5.7 01/15/2021     4. Diabetic polyneuropathy associated with type 2 diabetes mellitus (HCC) Has constant burnig and tingling in bil feet. Takes neurontin dialy and that helps.  5. Aortic calcification [facility]) Saw cardiologist on march 4,2022. According to note she was doing well with no changes to plan of care.  6. Permanent atrial fibrillation ([place]) CHA2DS2Vasc = 5; Xarelto Is on xeralto with no bleeding problems. She denies heart racing or palpitations.  7. Hypothyroidism due to acquired atrophy of thyroid No problems that aware of. Lab Results  Component Value Date   TSH 0.277 (L) 01/15/2021     8. Peripheral edema Daily lower ext edema. They gone down at night and gradually swell during the day. Has compression socks but does no like to wear them. She does try to elevate legs when sitting   9. Endometrial cancer ([place]) Had hysterectomey in 2014. Did not have to have chemo or radiation.  10. Morbid obesity ([place]) No recent weight changes Wt Readings from Last 3 Encounters:   04/14/21 197 lb (89.4 kg)  01/30/21 199 lb 6.4 oz (90.4 kg)  01/15/21 198 lb (89.8 kg)   BMI Readings from Last 3 Encounters:  04/14/21 33.81 kg/m  01/30/21 34.23 kg/m  01/15/21 33.99 kg/m       Outpatient Encounter Medications as of 04/14/2021  Medication Sig  . Accu-Chek Softclix Lancets lancets Check BS once daily Dx E11.42  . acetaminophen (TYLENOL) 500 MG tablet Take 1 tablet (500 mg total) by mouth every 8 (eight) hours as needed.  . blood glucose meter kit and supplies Dispense based on patient and insurance preference. Use up to four times daily as directed. (FOR ICD-10 E10.9, E11.9).  . furosemide (LASIX) 40 MG tablet Take 2 tablets (80 mg total) by mouth 2 (two) times daily. MAY TAKE AN ADDITIONAL 40 MG IF WEIGHT GAIN OF 3 LBS OR MORE OVER NIGHT  . gabapentin (NEURONTIN) 300 MG capsule Take 1 capsule (300 mg total) by mouth 2 (two) times daily.  [name] glucose blood (ACCU-CHEK GUIDE) test strip Check BS once daily Dx E11.42  . levothyroxine (SYNTHROID) 125 MCG tablet Take 1 tablet (125 mcg total) by mouth daily.  . metFORMIN (GLUCOPHAGE) 500 MG tablet Take 1 tablet (500 mg total) by mouth 2 (two) times daily with a meal.  . metoprolol succinate (TOPROL-XL) 25 MG 24 hr tablet Take 1 tablet (25 mg total) by mouth daily.  [name] nystatin cream (MYCOSTATIN) Apply 1 application topically 2 (two) times  daily.  . rivaroxaban (XARELTO) 20 MG TABS tablet TAKE 1 TABLET EVERY DAY WITH BREAKFAST  . triamcinolone cream (KENALOG) 0.5 % APPLY 1 APPLICATION TOPICALLY THREE TIMES DAILY AS NEEDED.   No facility-administered encounter medications on file as of 04/14/2021.    Past Surgical History:  Procedure Laterality Date  . ABDOMINAL HYSTERECTOMY Bilateral 06/26/2013   Procedure: TOTAL ABDOMINAL HYSTERECTOMY WITH BILATERAL SALPINGO OOPHERECTOMY WITH PELVIC LYMPHADNECTOMY;  Surgeon: Alvino Chapel, MD;  Location: [facility];  Service: Gynecology;  Laterality: Bilateral;  . BACK SURGERY   06/26/2013  . ECTOPIC PREGNANCY SURGERY    . HERNIA REPAIR    . HYSTEROSCOPY WITH D & C N/A 04/26/2013   Procedure: DILATATION AND CURETTAGE /HYSTEROSCOPY;  Surgeon: Woodroe Mode, MD;  Location: [place] [facility] [facility];  Service: Gynecology;  Laterality: N/A;  . JOINT REPLACEMENT    . LAPAROTOMY     for ectopic pregnancy  . LAPAROTOMY N/A 06/26/2013   Procedure: EXPLORATORY LAPAROTOMY;  Surgeon: Alvino Chapel, MD;  Location: [facility];  Service: Gynecology;  Laterality: N/A;  . lt knee arthroscopy    . rt total hip replacement    . TONSILLECTOMY    . TOTAL KNEE ARTHROPLASTY Left 12/22/2016   Procedure: LEFT TOTAL KNEE ARTHROPLASTY;  Surgeon: Latanya Maudlin, MD;  Location: [facility];  Service: Orthopedics;  Laterality: Left;  . TRANSTHORACIC ECHOCARDIOGRAM  09/2018   EF 60-65%. Mild AS.  Mod-Severe PAH 60 mmHg  . WISDOM TOOTH EXTRACTION      Family History  Problem Relation Age of Onset  . Heart disease Father   . Thyroid disease Father   . Heart attack Father   . Heart disease Sister        open heart surgery  . Cancer Sister        breast  . Neuropathy Sister        secondary to cancer treatment  . Breast cancer Sister   . Suicidality Brother 45  . Arthritis Mother   . Cancer Daughter   . Breast cancer Daughter     New complaints: None today  Social history: Lives with her husband  Controlled substance contract: n/a      Review of Systems  Constitutional: Negative for diaphoresis.  Eyes: Negative for pain.  Respiratory: Negative for shortness of breath.   Cardiovascular: Positive for leg swelling. Negative for chest pain and palpitations.  Gastrointestinal: Negative for abdominal pain.  Endocrine: Negative for polydipsia.  Skin: Negative for rash.  Neurological: Negative for dizziness, weakness and headaches.  Hematological: Does not bruise/bleed easily.  All other systems reviewed and are negative.      Objective:   Physical Exam Vitals and nursing note reviewed.   Constitutional:      General: She is not in acute distress.    Appearance: Normal appearance. She is well-developed.  HENT:     Head: Normocephalic.     Nose: Nose normal.  Eyes:     Pupils: Pupils are equal, round, and reactive to light.  Neck:     Vascular: No carotid bruit or JVD.  Cardiovascular:     Rate and Rhythm: Normal rate and regular rhythm.     Heart sounds: Normal heart sounds.  Pulmonary:     Effort: Pulmonary effort is normal. No respiratory distress.     Breath sounds: Normal breath sounds. No wheezing or rales.  Chest:     Chest wall: No tenderness.  Abdominal:     General: Bowel sounds are normal.  There is no distension or abdominal bruit.     Palpations: Abdomen is soft. There is no hepatomegaly, splenomegaly, mass or pulsatile mass.     Tenderness: There is no abdominal tenderness.  Musculoskeletal:        General: Normal range of motion.     Cervical back: Normal range of motion and neck supple.     Right lower leg: Edema (1+) present.     Left lower leg: Edema (1+) present.  Lymphadenopathy:     Cervical: No cervical adenopathy.  Skin:    General: Skin is warm and dry.  Neurological:     Mental Status: She is alert. She is disoriented.     Deep Tendon Reflexes: Reflexes are normal and symmetric.  Psychiatric:        Behavior: Behavior normal.        Thought Content: Thought content normal.        Judgment: Judgment normal.    BP 132/70   Pulse 90   Temp 97.6 F (36.4 C) (Temporal)   Resp 20   Ht 5' 4"  (1.626 m)   Wt 197 lb (89.4 kg)   SpO2 95%   BMI 33.81 kg/m         Assessment & Plan:  Debra Campbell comes in today with chief complaint of Medical Management of Chronic Issues   Diagnosis and orders addressed:  1. Hypertension associated with type 2 diabetes mellitus (HCC) Low sodium diet - CBC with Differential/Platelet - CMP14+EGFR - metoprolol succinate (TOPROL-XL) 25 MG 24 hr tablet; Take 1 tablet (25 mg total) by mouth  daily.  Dispense: 90 tablet; Refill: 1  2. Hyperlipidemia associated with type 2 diabetes mellitus (HCC) Low fta diet - Lipid panel  3. Type 2 diabetes mellitus with diabetic polyneuropathy, without long-term current use of insulin (HCC) Continue to watch carbs in diet - Bayer DCA Hb A1c Waived - metFORMIN (GLUCOPHAGE) 500 MG tablet; Take 1 tablet (500 mg total) by mouth 2 (two) times daily with a meal.  Dispense: 180 tablet; Refill: 1  4. Diabetic polyneuropathy associated with type 2 diabetes mellitus ([place]) Do not go barefooted - gabapentin (NEURONTIN) 300 MG capsule; Take 1 capsule (300 mg total) by mouth 2 (two) times daily.  Dispense: 180 capsule; Refill: 1  5. Aortic calcification ([place]) Keep yearly appointments with cardiology  6. Permanent atrial fibrillation (HCC) CHA2DS2Vasc = 5; Xarelto Avoid caffeine - rivaroxaban (XARELTO) 20 MG TABS tablet; TAKE 1 TABLET EVERY DAY WITH BREAKFAST  Dispense: 90 tablet; Refill: 1  7. Hypothyroidism due to acquired atrophy of thyroid Labs pending - levothyroxine (SYNTHROID) 125 MCG tablet; Take 1 tablet (125 mcg total) by mouth daily.  Dispense: 90 tablet; Refill: 1  8. Peripheral edema Elevate legs when sitting encouraged to wear compression hose - furosemide (LASIX) 40 MG tablet; Take 2 tablets (80 mg total) by mouth 2 (two) times daily. MAY TAKE AN ADDITIONAL 40 MG IF WEIGHT GAIN OF 3 LBS OR MORE OVER NIGHT  Dispense: 360 tablet; Refill: 1  9. Endometrial cancer ([place])  10. Morbid obesity ([place]) Discussed diet and exercise for person with BMI >25 Will recheck weight in 3-6 months    Labs pending Health Maintenance reviewed Diet and exercise encouraged  Follow up plan: 3 months   [name] Hassell Done, FNP

## 2021-04-15 LAB — CMP14+EGFR
ALT: 13 IU/L (ref 0–32)
AST: 17 IU/L (ref 0–40)
Albumin/Globulin Ratio: 1.6 (ref 1.2–2.2)
Albumin: 4.1 g/dL (ref 3.6–4.6)
Alkaline Phosphatase: 72 IU/L (ref 44–121)
BUN/Creatinine Ratio: 10 — ABNORMAL LOW (ref 12–28)
BUN: 10 mg/dL (ref 8–27)
Bilirubin Total: 0.5 mg/dL (ref 0.0–1.2)
CO2: 20 mmol/L (ref 20–29)
Calcium: 9.4 mg/dL (ref 8.7–10.3)
Chloride: 108 mmol/L — ABNORMAL HIGH (ref 96–106)
Creatinine, Ser: 1.02 mg/dL — ABNORMAL HIGH (ref 0.57–1.00)
Globulin, Total: 2.6 g/dL (ref 1.5–4.5)
Glucose: 120 mg/dL — ABNORMAL HIGH (ref 65–99)
Potassium: 4.5 mmol/L (ref 3.5–5.2)
Sodium: 144 mmol/L (ref 134–144)
Total Protein: 6.7 g/dL (ref 6.0–8.5)
eGFR: 54 mL/min/{1.73_m2} — ABNORMAL LOW (ref >59)

## 2021-04-15 LAB — CBC WITH DIFFERENTIAL/PLATELET
Basophils Absolute: 0.1 10*3/uL (ref 0.0–0.2)
Basos: 1 %
EOS (ABSOLUTE): 0.2 10*3/uL (ref 0.0–0.4)
Eos: 5 %
Hematocrit: 38 % (ref 34.0–46.6)
Hemoglobin: 12.5 g/dL (ref 11.1–15.9)
Immature Grans (Abs): 0 10*3/uL (ref 0.0–0.1)
Immature Granulocytes: 0 %
Lymphocytes Absolute: 1.8 10*3/uL (ref 0.7–3.1)
Lymphs: 33 %
MCH: 30.7 pg (ref 26.6–33.0)
MCHC: 32.9 g/dL (ref 31.5–35.7)
MCV: 93 fL (ref 79–97)
Monocytes Absolute: 0.4 10*3/uL (ref 0.1–0.9)
Monocytes: 7 %
Neutrophils Absolute: 2.9 10*3/uL (ref 1.4–7.0)
Neutrophils: 54 %
Platelets: 217 10*3/uL (ref 150–450)
RBC: 4.07 x10E6/uL (ref 3.77–5.28)
RDW: 13.2 % (ref 11.7–15.4)
WBC: 5.3 10*3/uL (ref 3.4–10.8)

## 2021-04-15 LAB — LIPID PANEL
Chol/HDL Ratio: 2.7 ratio (ref 0.0–4.4)
Cholesterol, Total: 174 mg/dL (ref 100–199)
HDL: 64 mg/dL (ref >39)
LDL Chol Calc (NIH): 90 mg/dL (ref 0–99)
Triglycerides: 111 mg/dL (ref 0–149)
VLDL Cholesterol Cal: 20 mg/dL (ref 5–40)

## 2021-05-13 DIAGNOSIS — Z23 Encounter for immunization: Secondary | ICD-10-CM | POA: Diagnosis not present

## 2021-05-15 ENCOUNTER — Other Ambulatory Visit: Payer: Self-pay | Admitting: Nurse Practitioner

## 2021-05-15 DIAGNOSIS — E034 Atrophy of thyroid (acquired): Secondary | ICD-10-CM

## 2021-06-06 ENCOUNTER — Other Ambulatory Visit: Payer: Self-pay | Admitting: Nurse Practitioner

## 2021-07-05 ENCOUNTER — Other Ambulatory Visit: Payer: Self-pay | Admitting: Nurse Practitioner

## 2021-07-05 DIAGNOSIS — E034 Atrophy of thyroid (acquired): Secondary | ICD-10-CM

## 2021-07-06 NOTE — Telephone Encounter (Signed)
Per OV note dated 04/14/21 pt is taking 125 mcg of Levothyroxine, this was put in the comments section back to the pharmacy & the 175 mcg was removed from the patients med list. Dose was changed from 175 mcg down to 150 mcg on 10/14/20, then MMM ordered 125 mcg on 01/16/21 and pt has been on this dose since.

## 2021-07-16 ENCOUNTER — Other Ambulatory Visit: Payer: Self-pay

## 2021-07-16 ENCOUNTER — Encounter: Payer: Self-pay | Admitting: Nurse Practitioner

## 2021-07-16 ENCOUNTER — Ambulatory Visit (INDEPENDENT_AMBULATORY_CARE_PROVIDER_SITE_OTHER): Payer: Medicare Other | Admitting: Nurse Practitioner

## 2021-07-16 VITALS — BP 139/84 | HR 63 | Temp 97.2°F | Resp 20 | Ht 64.0 in | Wt 205.0 lb

## 2021-07-16 DIAGNOSIS — E1159 Type 2 diabetes mellitus with other circulatory complications: Secondary | ICD-10-CM

## 2021-07-16 DIAGNOSIS — I272 Pulmonary hypertension, unspecified: Secondary | ICD-10-CM | POA: Diagnosis not present

## 2021-07-16 DIAGNOSIS — M8588 Other specified disorders of bone density and structure, other site: Secondary | ICD-10-CM

## 2021-07-16 DIAGNOSIS — E785 Hyperlipidemia, unspecified: Secondary | ICD-10-CM

## 2021-07-16 DIAGNOSIS — R600 Localized edema: Secondary | ICD-10-CM

## 2021-07-16 DIAGNOSIS — E1169 Type 2 diabetes mellitus with other specified complication: Secondary | ICD-10-CM | POA: Diagnosis not present

## 2021-07-16 DIAGNOSIS — E034 Atrophy of thyroid (acquired): Secondary | ICD-10-CM | POA: Diagnosis not present

## 2021-07-16 DIAGNOSIS — I4821 Permanent atrial fibrillation: Secondary | ICD-10-CM | POA: Diagnosis not present

## 2021-07-16 DIAGNOSIS — E1142 Type 2 diabetes mellitus with diabetic polyneuropathy: Secondary | ICD-10-CM | POA: Diagnosis not present

## 2021-07-16 DIAGNOSIS — I152 Hypertension secondary to endocrine disorders: Secondary | ICD-10-CM | POA: Diagnosis not present

## 2021-07-16 DIAGNOSIS — R609 Edema, unspecified: Secondary | ICD-10-CM | POA: Diagnosis not present

## 2021-07-16 LAB — BAYER DCA HB A1C WAIVED: HB A1C (BAYER DCA - WAIVED): 5.9 % (ref <7.0)

## 2021-07-16 MED ORDER — METFORMIN HCL 500 MG PO TABS: 500.0000 mg | ORAL_TABLET | Freq: Two times a day (BID) | ORAL | 1 refills | Status: DC

## 2021-07-16 MED ORDER — LEVOTHYROXINE SODIUM 125 MCG PO TABS: 125.0000 ug | ORAL_TABLET | Freq: Every day | ORAL | 1 refills | Status: DC

## 2021-07-16 MED ORDER — RIVAROXABAN 20 MG PO TABS: ORAL_TABLET | ORAL | 1 refills | Status: DC

## 2021-07-16 MED ORDER — GABAPENTIN 300 MG PO CAPS: 300.0000 mg | ORAL_CAPSULE | Freq: Two times a day (BID) | ORAL | 1 refills | Status: DC

## 2021-07-16 MED ORDER — METOPROLOL SUCCINATE ER 25 MG PO TB24: 25.0000 mg | ORAL_TABLET | Freq: Every day | ORAL | 1 refills | Status: DC

## 2021-07-16 NOTE — Patient Instructions (Signed)

## 2021-07-16 NOTE — Progress Notes (Deleted)
Subjective:    Patient ID: Debra Campbell, female    DOB: 09-08-1932, 87 y.o.   MRN: [id number]  Chief Complaint: medical management of chronic issues     HPI:  1. Hypertension associated with type 2 diabetes mellitus (HCC) ***  2. Hyperlipidemia associated with type 2 diabetes mellitus (HCC) ***  3. Type 2 diabetes mellitus with diabetic polyneuropathy, without long-term current use of insulin (HCC) ***  4. Permanent atrial fibrillation ([place]) CHA2DS2Vasc = 5; Xarelto ***  5. Pulmonary hypertension, unspecified (HCC) ***  6. Diabetic polyneuropathy associated with type 2 diabetes mellitus (HCC) ***  7. Hypothyroidism due to acquired atrophy of thyroid ***  8. Peripheral edema ***  9. Morbid obesity (HCC) ***  10. Osteopenia of lumbar spine ***    Outpatient Encounter Medications as of 07/16/2021  Medication Sig   Accu-Chek Softclix Lancets lancets Check BS once daily Dx E11.42   acetaminophen (TYLENOL) 500 MG tablet Take 1 tablet (500 mg total) by mouth every 8 (eight) hours as needed.   blood glucose meter kit and supplies Dispense based on patient and insurance preference. Use up to four times daily as directed. (FOR ICD-10 E10.9, E11.9).   glucose blood (ACCU-CHEK GUIDE) test strip CHECK BLOOD SUGAR ONCE DAILY DX E11.42   nystatin cream (MYCOSTATIN) Apply 1 application topically 2 (two) times daily.   triamcinolone cream (KENALOG) 0.5 % APPLY 1 APPLICATION TOPICALLY THREE TIMES DAILY AS NEEDED.   [DISCONTINUED] gabapentin (NEURONTIN) 300 MG capsule Take 1 capsule (300 mg total) by mouth 2 (two) times daily.   [DISCONTINUED] levothyroxine (SYNTHROID) 125 MCG tablet TAKE 1 TABLET DAILY.   [DISCONTINUED] metFORMIN (GLUCOPHAGE) 500 MG tablet Take 1 tablet (500 mg total) by mouth 2 (two) times daily with a meal.   [DISCONTINUED] metoprolol succinate (TOPROL-XL) 25 MG 24 hr tablet Take 1 tablet (25 mg total) by mouth daily.   [DISCONTINUED] rivaroxaban (XARELTO) 20  MG TABS tablet TAKE 1 TABLET EVERY DAY WITH BREAKFAST   furosemide (LASIX) 40 MG tablet Take 2 tablets (80 mg total) by mouth 2 (two) times daily. MAY TAKE AN ADDITIONAL 40 MG IF WEIGHT GAIN OF 3 LBS OR MORE OVER NIGHT   gabapentin (NEURONTIN) 300 MG capsule Take 1 capsule (300 mg total) by mouth 2 (two) times daily.   levothyroxine (SYNTHROID) 125 MCG tablet Take 1 tablet (125 mcg total) by mouth daily.   metFORMIN (GLUCOPHAGE) 500 MG tablet Take 1 tablet (500 mg total) by mouth 2 (two) times daily with a meal.   metoprolol succinate (TOPROL-XL) 25 MG 24 hr tablet Take 1 tablet (25 mg total) by mouth daily.   rivaroxaban (XARELTO) 20 MG TABS tablet TAKE 1 TABLET EVERY DAY WITH BREAKFAST   No facility-administered encounter medications on file as of 07/16/2021.    Past Surgical History:  Procedure Laterality Date   ABDOMINAL HYSTERECTOMY Bilateral 06/26/2013   Procedure: TOTAL ABDOMINAL HYSTERECTOMY WITH BILATERAL SALPINGO OOPHERECTOMY WITH PELVIC LYMPHADNECTOMY;  Surgeon: Alvino Chapel, MD;  [facility];  Service: Gynecology;  Laterality: Bilateral;   BACK SURGERY  06/26/2013   ECTOPIC PREGNANCY SURGERY     HERNIA REPAIR     HYSTEROSCOPY WITH D & C N/A 04/26/2013   Procedure: DILATATION AND CURETTAGE /HYSTEROSCOPY;  Surgeon: Woodroe Mode, MD;  Location: [place] [facility];  Service: Gynecology;  Laterality: N/A;   JOINT REPLACEMENT     LAPAROTOMY     for ectopic pregnancy   LAPAROTOMY N/A 06/26/2013   Procedure: EXPLORATORY LAPAROTOMY;  Surgeon: Alvino Chapel, MD;  [facility];  Service: Gynecology;  Laterality: N/A;   lt knee arthroscopy     rt total hip replacement     TONSILLECTOMY     TOTAL KNEE ARTHROPLASTY Left 12/22/2016   Procedure: LEFT TOTAL KNEE ARTHROPLASTY;  Surgeon: Latanya Maudlin, MD;  [facility];  Service: Orthopedics;  Laterality: Left;   TRANSTHORACIC ECHOCARDIOGRAM  09/2018   EF 60-65%. Mild AS.  Mod-Severe PAH 60 mmHg   WISDOM TOOTH EXTRACTION       Family History  Problem Relation Age of Onset   Heart disease Father    Thyroid disease Father    Heart attack Father    Heart disease Sister        open heart surgery   Cancer Sister        breast   Neuropathy Sister        secondary to cancer treatment   Breast cancer Sister    Suicidality Brother 71   Arthritis Mother    Cancer Daughter    Breast cancer Daughter     New complaints: ***  Social history: ***  Controlled substance contract: ***     Review of Systems     Objective:   Physical Exam        Assessment & Plan:

## 2021-07-16 NOTE — Progress Notes (Signed)
Subjective:    Patient ID: Debra Campbell, female    DOB: 05/21/1932, 87 y.o.   MRN: [id number]   Chief Complaint: medical management of chronic issues    Patient is not accompanied by anyone today and she is not a very good historian.  HPI:  1. Hypertension associated with type 2 diabetes mellitus (HCC) No c/o chest pain, soB or headache. Doe snot check blood pressure at home. BP Readings from Last 3 Encounters:  04/14/21 132/70  01/30/21 (!) 144/77  01/15/21 126/67     2. Hyperlipidemia associated with type 2 diabetes mellitus ([place]) Doe snot watch diet and does no dedicated exercise. Lab Results  Component Value Date   CHOL 174 04/14/2021   HDL 64 04/14/2021   LDLCALC 90 04/14/2021   TRIG 111 04/14/2021   CHOLHDL 2.7 04/14/2021     3. Type 2 diabetes mellitus with diabetic polyneuropathy, without long-term current use of insulin ([place]) She says her blood sugars are never over 110 she doie snot really watch her diet. Lab Results  Component Value Date   HGBA1C 5.9 04/14/2021     4. Permanent atrial fibrillation ([place]) CHA2DS2Vasc = 5; Xarelto No c/o palpitations or heart racing.  5. Pulmonary hypertension, unspecified (HCC)  no sob  6. Diabetic polyneuropathy associated with type 2 diabetes mellitus (HCC) Has burning and numbness of bil feet.  7. Hypothyroidism due to acquired atrophy of thyroid No problems that aware of. Lab Results  Component Value Date   TSH 0.277 (L) 01/15/2021     8. Peripheral edema Has edema daily  9. Morbid obesity ([place]) Weight is up 8lbs from last visit Wt Readings from Last 3 Encounters:  07/16/21 205 lb (93 kg)  04/14/21 197 lb (89.4 kg)  01/30/21 199 lb 6.4 oz (90.4 kg)   BMI Readings from Last 3 Encounters:  07/16/21 35.19 kg/m  04/14/21 33.81 kg/m  01/30/21 34.23 kg/m     10. Osteopenia of lumbar spine Last dexascan was done 01/15/21. Her t score was -2.0    Outpatient Encounter Medications as of  07/16/2021  Medication Sig   Accu-Chek Softclix Lancets lancets Check BS once daily Dx E11.42   acetaminophen (TYLENOL) 500 MG tablet Take 1 tablet (500 mg total) by mouth every 8 (eight) hours as needed.   blood glucose meter kit and supplies Dispense based on patient and insurance preference. Use up to four times daily as directed. (FOR ICD-10 E10.9, E11.9).   furosemide (LASIX) 40 MG tablet Take 2 tablets (80 mg total) by mouth 2 (two) times daily. MAY TAKE AN ADDITIONAL 40 MG IF WEIGHT GAIN OF 3 LBS OR MORE OVER NIGHT   gabapentin (NEURONTIN) 300 MG capsule Take 1 capsule (300 mg total) by mouth 2 (two) times daily.   glucose blood (ACCU-CHEK GUIDE) test strip CHECK BLOOD SUGAR ONCE DAILY DX E11.42   levothyroxine (SYNTHROID) 125 MCG tablet TAKE 1 TABLET DAILY.   metFORMIN (GLUCOPHAGE) 500 MG tablet Take 1 tablet (500 mg total) by mouth 2 (two) times daily with a meal.   metoprolol succinate (TOPROL-XL) 25 MG 24 hr tablet Take 1 tablet (25 mg total) by mouth daily.   nystatin cream (MYCOSTATIN) Apply 1 application topically 2 (two) times daily.   rivaroxaban (XARELTO) 20 MG TABS tablet TAKE 1 TABLET EVERY DAY WITH BREAKFAST   triamcinolone cream (KENALOG) 0.5 % APPLY 1 APPLICATION TOPICALLY THREE TIMES DAILY AS NEEDED.   No facility-administered encounter medications on file as of 07/16/2021.  Past Surgical History:  Procedure Laterality Date   ABDOMINAL HYSTERECTOMY Bilateral 06/26/2013   Procedure: TOTAL ABDOMINAL HYSTERECTOMY WITH BILATERAL SALPINGO OOPHERECTOMY WITH PELVIC LYMPHADNECTOMY;  Surgeon: Alvino Chapel, MD;  [facility];  Service: Gynecology;  Laterality: Bilateral;   BACK SURGERY  06/26/2013   ECTOPIC PREGNANCY SURGERY     HERNIA REPAIR     HYSTEROSCOPY WITH D & C N/A 04/26/2013   Procedure: DILATATION AND CURETTAGE /HYSTEROSCOPY;  Surgeon: Woodroe Mode, MD;  Location: [place] [facility];  Service: Gynecology;  Laterality: N/A;   JOINT REPLACEMENT     LAPAROTOMY      for ectopic pregnancy   LAPAROTOMY N/A 06/26/2013   Procedure: EXPLORATORY LAPAROTOMY;  Surgeon: Alvino Chapel, MD;  [facility];  Service: Gynecology;  Laterality: N/A;   lt knee arthroscopy     rt total hip replacement     TONSILLECTOMY     TOTAL KNEE ARTHROPLASTY Left 12/22/2016   Procedure: LEFT TOTAL KNEE ARTHROPLASTY;  Surgeon: Latanya Maudlin, MD;  [facility];  Service: Orthopedics;  Laterality: Left;   TRANSTHORACIC ECHOCARDIOGRAM  09/2018   EF 60-65%. Mild AS.  Mod-Severe PAH 60 mmHg   WISDOM TOOTH EXTRACTION      Family History  Problem Relation Age of Onset   Heart disease Father    Thyroid disease Father    Heart attack Father    Heart disease Sister        open heart surgery   Cancer Sister        breast   Neuropathy Sister        secondary to cancer treatment   Breast cancer Sister    Suicidality Brother 2   Arthritis Mother    Cancer Daughter    Breast cancer Daughter     Has uriary incontinece  Social history: Lives with her husband.  Controlled substance contract: n/a     Review of Systems  Constitutional:  Negative for diaphoresis.  Eyes:  Negative for pain.  Respiratory:  Negative for shortness of breath.   Cardiovascular:  Negative for chest pain, palpitations and leg swelling.  Gastrointestinal:  Negative for abdominal pain.  Endocrine: Negative for polydipsia.  Skin:  Negative for rash.  Neurological:  Negative for dizziness, weakness and headaches.  Hematological:  Does not bruise/bleed easily.  All other systems reviewed and are negative.     Objective:   Physical Exam Vitals and nursing note reviewed.  Constitutional:      General: She is not in acute distress.    Appearance: Normal appearance. She is well-developed.  HENT:     Head: Normocephalic.     Right Ear: Tympanic membrane normal.     Left Ear: Tympanic membrane normal.     Nose: Nose normal.     Mouth/Throat:     Mouth: Mucous membranes are moist.   Eyes:     Pupils: Pupils are equal, round, and reactive to light.  Neck:     Vascular: No carotid bruit or JVD.  Cardiovascular:     Rate and Rhythm: Normal rate. Rhythm irregular.     Heart sounds: Normal heart sounds.  Pulmonary:     Effort: Pulmonary effort is normal. No respiratory distress.     Breath sounds: Normal breath sounds. No wheezing or rales.  Chest:     Chest wall: No tenderness.  Abdominal:     General: Bowel sounds are normal. There is no distension or abdominal bruit.     Palpations:  Abdomen is soft. There is no hepatomegaly, splenomegaly, mass or pulsatile mass.     Tenderness: There is no abdominal tenderness.  Musculoskeletal:        General: Normal range of motion.     Cervical back: Normal range of motion and neck supple.     Right lower leg: Edema (2+) present.     Left lower leg: Edema (2+) present.     Comments: Walking with walker  Lymphadenopathy:     Cervical: No cervical adenopathy.  Skin:    General: Skin is warm and dry.  Neurological:     Mental Status: She is alert and oriented to person, place, and time.     Deep Tendon Reflexes: Reflexes are normal and symmetric.  Psychiatric:        Behavior: Behavior normal.        Thought Content: Thought content normal.        Judgment: Judgment normal.   BP 139/84   Pulse 63   Temp (!) 97.2 F (36.2 C) (Temporal)   Resp 20   Ht 5' 4"  (1.626 m)   Wt 205 lb (93 kg)   SpO2 98%   BMI 35.19 kg/m   HGBA1c 5.9%       Assessment & Plan:   XARIA JUDON comes in today with chief complaint of Medical Management of Chronic Issues   Diagnosis and orders addressed:  1. Hypertension associated with type 2 diabetes mellitus (HCC) Low sodium diet - CBC with Differential/Platelet - CMP14+EGFR - metoprolol succinate (TOPROL-XL) 25 MG 24 hr tablet; Take 1 tablet (25 mg total) by mouth daily.  Dispense: 90 tablet; Refill: 1  2. Hyperlipidemia associated with type 2 diabetes mellitus (HCC) Low  fat diet - Lipid panel  3. Type 2 diabetes mellitus with diabetic polyneuropathy, without long-term current use of insulin (HCC) Continue to wtahc carbs in diet - Bayer DCA Hb A1c Waived - metFORMIN (GLUCOPHAGE) 500 MG tablet; Take 1 tablet (500 mg total) by mouth 2 (two) times daily with a meal.  Dispense: 180 tablet; Refill: 1  4. Permanent atrial fibrillation (HCC) CHA2DS2Vasc = 5; Xarelto Avoid caffeine - rivaroxaban (XARELTO) 20 MG TABS tablet; TAKE 1 TABLET EVERY DAY WITH BREAKFAST  Dispense: 90 tablet; Refill: 1  5. Pulmonary hypertension, unspecified ([place])  6. Diabetic polyneuropathy associated with type 2 diabetes mellitus ([place]) Do not go bare footed Check feet daily  - gabapentin (NEURONTIN) 300 MG capsule; Take 1 capsule (300 mg total) by mouth 2 (two) times daily.  Dispense: 180 capsule; Refill: 1  7. Hypothyroidism due to acquired atrophy of thyroid Labs pending - levothyroxine (SYNTHROID) 125 MCG tablet; Take 1 tablet (125 mcg total) by mouth daily.  Dispense: 90 tablet; Refill: 1  8. Peripheral edema Elevate  legs when sitting  9. Morbid obesity ([place]) Discussed diet and exercise for person with BMI >25 Will recheck weight in 3-6 months   10. Osteopenia of lumbar spine Weight bearing exercises if can tolerate.   Labs pending Health Maintenance reviewed Diet and exercise encouraged  Follow up plan: 3 months   [name] Hassell Done, FNP

## 2021-07-16 NOTE — Progress Notes (Deleted)
Subjective:    Patient ID: Debra Campbell, female    DOB: [birth year], 87 y.o.   MRN: [id number]  Chief Complaint: medical management of chronic issues     HPI:  1. Hypertension associated with type 2 diabetes mellitus (HCC) ***  2. Hyperlipidemia associated with type 2 diabetes mellitus (HCC) ***  3. Type 2 diabetes mellitus with diabetic polyneuropathy, without long-term current use of insulin (HCC) ***  4. Permanent atrial fibrillation ([place]) CHA2DS2Vasc = 5; Xarelto ***  5. Pulmonary hypertension, unspecified (HCC) ***  6. Diabetic polyneuropathy associated with type 2 diabetes mellitus (HCC) ***  7. Hypothyroidism due to acquired atrophy of thyroid ***  8. Peripheral edema ***  9. Morbid obesity (HCC) ***  10. Osteopenia of lumbar spine ***    Outpatient Encounter Medications as of 07/16/2021  Medication Sig   Accu-Chek Softclix Lancets lancets Check BS once daily Dx E11.42   acetaminophen (TYLENOL) 500 MG tablet Take 1 tablet (500 mg total) by mouth every 8 (eight) hours as needed.   blood glucose meter kit and supplies Dispense based on patient and insurance preference. Use up to four times daily as directed. (FOR ICD-10 E10.9, E11.9).   furosemide (LASIX) 40 MG tablet Take 2 tablets (80 mg total) by mouth 2 (two) times daily. MAY TAKE AN ADDITIONAL 40 MG IF WEIGHT GAIN OF 3 LBS OR MORE OVER NIGHT   gabapentin (NEURONTIN) 300 MG capsule Take 1 capsule (300 mg total) by mouth 2 (two) times daily.   glucose blood (ACCU-CHEK GUIDE) test strip CHECK BLOOD SUGAR ONCE DAILY DX E11.42   levothyroxine (SYNTHROID) 125 MCG tablet TAKE 1 TABLET DAILY.   metFORMIN (GLUCOPHAGE) 500 MG tablet Take 1 tablet (500 mg total) by mouth 2 (two) times daily with a meal.   metoprolol succinate (TOPROL-XL) 25 MG 24 hr tablet Take 1 tablet (25 mg total) by mouth daily.   nystatin cream (MYCOSTATIN) Apply 1 application topically 2 (two) times daily.   rivaroxaban (XARELTO) 20 MG  TABS tablet TAKE 1 TABLET EVERY DAY WITH BREAKFAST   triamcinolone cream (KENALOG) 0.5 % APPLY 1 APPLICATION TOPICALLY THREE TIMES DAILY AS NEEDED.   No facility-administered encounter medications on file as of 07/16/2021.    Past Surgical History:  Procedure Laterality Date   ABDOMINAL HYSTERECTOMY Bilateral 06/26/2013   Procedure: TOTAL ABDOMINAL HYSTERECTOMY WITH BILATERAL SALPINGO OOPHERECTOMY WITH PELVIC LYMPHADNECTOMY;  Surgeon: Alvino Chapel, MD;  Location: [facility];  Service: Gynecology;  Laterality: Bilateral;   BACK SURGERY  06/26/2013   ECTOPIC PREGNANCY SURGERY     HERNIA REPAIR     HYSTEROSCOPY WITH D & C N/A 04/26/2013   Procedure: DILATATION AND CURETTAGE /HYSTEROSCOPY;  Surgeon: Woodroe Mode, MD;  Location: [facility];  Service: Gynecology;  Laterality: N/A;   JOINT REPLACEMENT     LAPAROTOMY     for ectopic pregnancy   LAPAROTOMY N/A 06/26/2013   Procedure: EXPLORATORY LAPAROTOMY;  Surgeon: Alvino Chapel, MD;  Location: [facility];  Service: Gynecology;  Laterality: N/A;   lt knee arthroscopy     rt total hip replacement     TONSILLECTOMY     TOTAL KNEE ARTHROPLASTY Left 12/22/2016   Procedure: LEFT TOTAL KNEE ARTHROPLASTY;  Surgeon: Latanya Maudlin, MD;  Location: [facility];  Service: Orthopedics;  Laterality: Left;   TRANSTHORACIC ECHOCARDIOGRAM  09/2018   EF 60-65%. Mild AS.  Mod-Severe PAH 60 mmHg   WISDOM TOOTH EXTRACTION      Family History  Problem Relation  Age of Onset   Heart disease Father    Thyroid disease Father    Heart attack Father    Heart disease Sister        open heart surgery   Cancer Sister        breast   Neuropathy Sister        secondary to cancer treatment   Breast cancer Sister    Suicidality Brother 57   Arthritis Mother    Cancer Daughter    Breast cancer Daughter     New complaints: ***  Social history: ***  Controlled substance contract: ***     Review of Systems     Objective:   Physical Exam         Assessment & Plan:

## 2021-07-17 LAB — CMP14+EGFR
ALT: 10 IU/L (ref 0–32)
AST: 20 IU/L (ref 0–40)
Albumin/Globulin Ratio: 1.5 (ref 1.2–2.2)
Albumin: 4.1 g/dL (ref 3.6–4.6)
Alkaline Phosphatase: 56 IU/L (ref 44–121)
BUN/Creatinine Ratio: 12 (ref 12–28)
BUN: 11 mg/dL (ref 8–27)
Bilirubin Total: 0.4 mg/dL (ref 0.0–1.2)
CO2: 23 mmol/L (ref 20–29)
Calcium: 9.2 mg/dL (ref 8.7–10.3)
Chloride: 103 mmol/L (ref 96–106)
Creatinine, Ser: 0.93 mg/dL (ref 0.57–1.00)
Globulin, Total: 2.7 g/dL (ref 1.5–4.5)
Glucose: 114 mg/dL — ABNORMAL HIGH (ref 65–99)
Potassium: 4.4 mmol/L (ref 3.5–5.2)
Sodium: 139 mmol/L (ref 134–144)
Total Protein: 6.8 g/dL (ref 6.0–8.5)
eGFR: 59 mL/min/{1.73_m2} — ABNORMAL LOW (ref >59)

## 2021-07-17 LAB — CBC WITH DIFFERENTIAL/PLATELET
Basophils Absolute: 0 10*3/uL (ref 0.0–0.2)
Basos: 1 %
EOS (ABSOLUTE): 0.4 10*3/uL (ref 0.0–0.4)
Eos: 9 %
Hematocrit: 38.1 % (ref 34.0–46.6)
Hemoglobin: 12.7 g/dL (ref 11.1–15.9)
Immature Grans (Abs): 0 10*3/uL (ref 0.0–0.1)
Immature Granulocytes: 0 %
Lymphocytes Absolute: 1.6 10*3/uL (ref 0.7–3.1)
Lymphs: 38 %
MCH: 30.9 pg (ref 26.6–33.0)
MCHC: 33.3 g/dL (ref 31.5–35.7)
MCV: 93 fL (ref 79–97)
Monocytes Absolute: 0.4 10*3/uL (ref 0.1–0.9)
Monocytes: 10 %
Neutrophils Absolute: 1.8 10*3/uL (ref 1.4–7.0)
Neutrophils: 42 %
Platelets: 164 10*3/uL (ref 150–450)
RBC: 4.11 x10E6/uL (ref 3.77–5.28)
RDW: 13.2 % (ref 11.7–15.4)
WBC: 4.3 10*3/uL (ref 3.4–10.8)

## 2021-07-17 LAB — LIPID PANEL
Chol/HDL Ratio: 3.2 ratio (ref 0.0–4.4)
Cholesterol, Total: 187 mg/dL (ref 100–199)
HDL: 59 mg/dL (ref >39)
LDL Chol Calc (NIH): 107 mg/dL — ABNORMAL HIGH (ref 0–99)
Triglycerides: 119 mg/dL (ref 0–149)
VLDL Cholesterol Cal: 21 mg/dL (ref 5–40)

## 2021-09-11 ENCOUNTER — Other Ambulatory Visit: Payer: Self-pay | Admitting: Nurse Practitioner

## 2021-10-16 ENCOUNTER — Other Ambulatory Visit: Payer: Self-pay

## 2021-10-16 ENCOUNTER — Ambulatory Visit (INDEPENDENT_AMBULATORY_CARE_PROVIDER_SITE_OTHER): Payer: Medicare Other | Admitting: Nurse Practitioner

## 2021-10-16 ENCOUNTER — Encounter: Payer: Self-pay | Admitting: Nurse Practitioner

## 2021-10-16 VITALS — BP 144/78 | HR 95 | Temp 97.6°F | Resp 20 | Ht 64.0 in | Wt 204.0 lb

## 2021-10-16 DIAGNOSIS — Z23 Encounter for immunization: Secondary | ICD-10-CM

## 2021-10-16 DIAGNOSIS — E034 Atrophy of thyroid (acquired): Secondary | ICD-10-CM | POA: Diagnosis not present

## 2021-10-16 DIAGNOSIS — I152 Hypertension secondary to endocrine disorders: Secondary | ICD-10-CM | POA: Diagnosis not present

## 2021-10-16 DIAGNOSIS — M8588 Other specified disorders of bone density and structure, other site: Secondary | ICD-10-CM

## 2021-10-16 DIAGNOSIS — E1159 Type 2 diabetes mellitus with other circulatory complications: Secondary | ICD-10-CM | POA: Diagnosis not present

## 2021-10-16 DIAGNOSIS — I4821 Permanent atrial fibrillation: Secondary | ICD-10-CM | POA: Diagnosis not present

## 2021-10-16 DIAGNOSIS — E785 Hyperlipidemia, unspecified: Secondary | ICD-10-CM | POA: Diagnosis not present

## 2021-10-16 DIAGNOSIS — E1142 Type 2 diabetes mellitus with diabetic polyneuropathy: Secondary | ICD-10-CM

## 2021-10-16 DIAGNOSIS — E1169 Type 2 diabetes mellitus with other specified complication: Secondary | ICD-10-CM

## 2021-10-16 DIAGNOSIS — R609 Edema, unspecified: Secondary | ICD-10-CM | POA: Diagnosis not present

## 2021-10-16 LAB — BAYER DCA HB A1C WAIVED: HB A1C (BAYER DCA - WAIVED): 5.8 % — ABNORMAL HIGH (ref 4.8–5.6)

## 2021-10-16 NOTE — Progress Notes (Signed)
Subjective:    Patient ID: Debra Campbell, female    DOB: [birth year], 87 y.o.   MRN: [id number]   Chief Complaint: medical management of chronic issues     HPI:  1. Hypertension associated with type 2 diabetes mellitus ([place]) No c/o chest pain, sob or headache. Does not check blood pressure at home. BP Readings from Last 3 Encounters:  07/16/21 139/84  04/14/21 132/70  01/30/21 (!) 144/77     2. Hyperlipidemia associated with type 2 diabetes mellitus ([place]) Doe snot watch diet and doe sno dedicated exercise. Lab Results  Component Value Date   CHOL 187 07/16/2021   HDL 59 07/16/2021   LDLCALC 107 (H) 07/16/2021   TRIG 119 07/16/2021   CHOLHDL 3.2 07/16/2021     3. Type 2 diabetes mellitus with diabetic polyneuropathy, without long-term current use of insulin (HCC) Does not check blood sugars at  home. Doe snot really wathc diet very closely. Lab Results  Component Value Date   HGBA1C 5.9 07/16/2021      4. Hypothyroidism due to acquired atrophy of thyroid No problems that aware of Lab Results  Component Value Date   TSH 0.277 (L) 01/15/2021     5. Permanent atrial fibrillation ([place]) CHA2DS2Vasc = 5; Xarelto Is on metoprolol. Denies palpitations or heart racing  6. Diabetic polyneuropathy associated with type 2 diabetes mellitus (HCC) Numbness in bil feet  7. Osteopenia of lumbar spine Last dexascan was done 01/21/21. T score of -2.0  8. Peripheral edema Has daily edema  9. Morbid obesity ([place]) No recent weight changes Wt Readings from Last 3 Encounters:  10/16/21 204 lb (92.5 kg)  07/16/21 205 lb (93 kg)  04/14/21 197 lb (89.4 kg)   BMI Readings from Last 3 Encounters:  10/16/21 35.02 kg/m  07/16/21 35.19 kg/m  04/14/21 33.81 kg/m       Outpatient Encounter Medications as of 10/16/2021  Medication Sig   Accu-Chek Softclix Lancets lancets CHECK BLOOD SUGAR ONCE DAILY DX E11.42   acetaminophen (TYLENOL) 500 MG tablet Take 1 tablet (500  mg total) by mouth every 8 (eight) hours as needed.   blood glucose meter kit and supplies Dispense based on patient and insurance preference. Use up to four times daily as directed. (FOR ICD-10 E10.9, E11.9).   furosemide (LASIX) 40 MG tablet Take 2 tablets (80 mg total) by mouth 2 (two) times daily. MAY TAKE AN ADDITIONAL 40 MG IF WEIGHT GAIN OF 3 LBS OR MORE OVER NIGHT   gabapentin (NEURONTIN) 300 MG capsule Take 1 capsule (300 mg total) by mouth 2 (two) times daily.   glucose blood (ACCU-CHEK GUIDE) test strip CHECK BLOOD SUGAR ONCE DAILY DX E11.42   levothyroxine (SYNTHROID) 125 MCG tablet Take 1 tablet (125 mcg total) by mouth daily.   metFORMIN (GLUCOPHAGE) 500 MG tablet Take 1 tablet (500 mg total) by mouth 2 (two) times daily with a meal.   metoprolol succinate (TOPROL-XL) 25 MG 24 hr tablet Take 1 tablet (25 mg total) by mouth daily.   nystatin cream (MYCOSTATIN) Apply 1 application topically 2 (two) times daily.   rivaroxaban (XARELTO) 20 MG TABS tablet TAKE 1 TABLET EVERY DAY WITH BREAKFAST   triamcinolone cream (KENALOG) 0.5 % APPLY 1 APPLICATION TOPICALLY THREE TIMES DAILY AS NEEDED.   No facility-administered encounter medications on file as of 10/16/2021.    Past Surgical History:  Procedure Laterality Date   ABDOMINAL HYSTERECTOMY Bilateral 06/26/2013   Procedure: TOTAL ABDOMINAL HYSTERECTOMY WITH BILATERAL SALPINGO OOPHERECTOMY  WITH PELVIC LYMPHADNECTOMY;  Surgeon: Alvino Chapel, MD;  Location: [facility];  Service: Gynecology;  Laterality: Bilateral;   BACK SURGERY  06/26/2013   ECTOPIC PREGNANCY SURGERY     HERNIA REPAIR     HYSTEROSCOPY WITH D & C N/A 04/26/2013   Procedure: DILATATION AND CURETTAGE /HYSTEROSCOPY;  Surgeon: Woodroe Mode, MD;  Location: [facility];  Service: Gynecology;  Laterality: N/A;   JOINT REPLACEMENT     LAPAROTOMY     for ectopic pregnancy   LAPAROTOMY N/A 06/26/2013   Procedure: EXPLORATORY LAPAROTOMY;  Surgeon: Alvino Chapel, MD;   Location: [facility];  Service: Gynecology;  Laterality: N/A;   lt knee arthroscopy     rt total hip replacement     TONSILLECTOMY     TOTAL KNEE ARTHROPLASTY Left 12/22/2016   Procedure: LEFT TOTAL KNEE ARTHROPLASTY;  Surgeon: Latanya Maudlin, MD;  Location: [facility];  Service: Orthopedics;  Laterality: Left;   TRANSTHORACIC ECHOCARDIOGRAM  09/2018   EF 60-65%. Mild AS.  Mod-Severe PAH 60 mmHg   WISDOM TOOTH EXTRACTION      Family History  Problem Relation Age of Onset   Heart disease Father    Thyroid disease Father    Heart attack Father    Heart disease Sister        open heart surgery   Cancer Sister        breast   Neuropathy Sister        secondary to cancer treatment   Breast cancer Sister    Suicidality Brother 7   Arthritis Mother    Cancer Daughter    Breast cancer Daughter     New complaints: Legs are weak and she cannot walk long distances without having toseat down.  Social history: Lives with her husband  Controlled substance contract: n/a     Review of Systems  Constitutional:  Negative for diaphoresis.  Eyes:  Negative for pain.  Respiratory:  Negative for shortness of breath.   Cardiovascular:  Positive for leg swelling. Negative for chest pain and palpitations.  Gastrointestinal:  Negative for abdominal pain.  Endocrine: Negative for polydipsia.  Skin:  Positive for rash.  Neurological:  Negative for dizziness, weakness and headaches.  Hematological:  Does not bruise/bleed easily.  All other systems reviewed and are negative.     Objective:   Physical Exam Vitals and nursing note reviewed.  Constitutional:      General: She is not in acute distress.    Appearance: Normal appearance. She is well-developed.  HENT:     Head: Normocephalic.     Right Ear: Tympanic membrane normal.     Left Ear: Tympanic membrane normal.     Nose: Nose normal.     Mouth/Throat:     Mouth: Mucous membranes are moist.  Eyes:     Pupils: Pupils are equal, round,  and reactive to light.  Neck:     Vascular: No carotid bruit or JVD.  Cardiovascular:     Rate and Rhythm: Normal rate and regular rhythm.     Heart sounds: Normal heart sounds.  Pulmonary:     Effort: Pulmonary effort is normal. No respiratory distress.     Breath sounds: Normal breath sounds. No wheezing or rales.  Chest:     Chest wall: No tenderness.  Abdominal:     General: Bowel sounds are normal. There is no distension or abdominal bruit.     Palpations: Abdomen is soft. There is no hepatomegaly, splenomegaly,  mass or pulsatile mass.     Tenderness: There is no abdominal tenderness.  Musculoskeletal:        General: Normal range of motion.     Cervical back: Normal range of motion and neck supple.     Right lower leg: Edema (1+) present.     Left lower leg: Edema (1+) present.  Lymphadenopathy:     Cervical: No cervical adenopathy.  Skin:    General: Skin is warm and dry.     Comments: small erythematous maculopapular lesion on back Macular lesion on left ankle.  Neurological:     Mental Status: She is alert and oriented to person, place, and time.     Deep Tendon Reflexes: Reflexes are normal and symmetric.  Psychiatric:        Behavior: Behavior normal.        Thought Content: Thought content normal.        Judgment: Judgment normal.    BP (!) 144/78   Pulse 95   Temp 97.6 F (36.4 C) (Temporal)   Resp 20   Ht 5' 4"  (1.626 m)   Wt 204 lb (92.5 kg)   SpO2 95%   BMI 35.02 kg/m          Assessment & Plan:   Debra Campbell comes in today with chief complaint of Medical Management of Chronic Issues (Has rash on feet,legs and back/Very itchy/)   Diagnosis and orders addressed:  1. Hypertension associated with type 2 diabetes mellitus (HCC) Low sodium diet - CBC with Differential/Platelet - CMP14+EGFR  2. Hyperlipidemia associated with type 2 diabetes mellitus (HCC) Low fat diet - Lipid panel  3. Type 2 diabetes mellitus with diabetic  polyneuropathy, without long-term current use of insulin (HCC) Continue to watch carbs in deit - Bayer DCA Hb A1c Waived  4. Hypothyroidism due to acquired atrophy of thyroid Labs pending - Thyroid Panel With TSH  5. Permanent atrial fibrillation ([place]) CHA2DS2Vasc = 5; Xarelto Continue metoprolol and xeralto as prescribed  6. Diabetic polyneuropathy associated with type 2 diabetes mellitus (HCC) Check feet daily  7. Osteopenia of lumbar spine Weight bearing exercis ewhen can tolerate  8. Peripheral edema Elevate legs when sitting Take fluid pill daily - For home use only DME standard manual wheelchair with seat cushion  9. Morbid obesity ([place]) Discussed diet and exercise for person with BMI >25 Will recheck weight in 3-6 months    Labs pending Health Maintenance reviewed Diet and exercise encouraged  Follow up plan: 3 months   [name] Hassell Done, FNP

## 2021-10-16 NOTE — Patient Instructions (Signed)
Peripheral Edema °Peripheral edema is swelling that is caused by a buildup of fluid. Peripheral edema most often affects the lower legs, ankles, and feet. It can also develop in the arms, hands, and face. The area of the body that has peripheral edema will look swollen. It may also feel heavy or warm. Your clothes may start to feel tight. Pressing on the area may make a temporary dent in your skin. You may not be able to move your swollen arm or leg as much as usual. °There are many causes of peripheral edema. It can happen because of a complication of other conditions such as congestive heart failure, kidney disease, or a problem with your blood circulation. It also can be a side effect of certain medicines or because of an infection. It often happens to women during pregnancy. Sometimes, the cause is not known. °Follow these instructions at home: °Managing pain, stiffness, and swelling ° °Raise (elevate) your legs while you are sitting or lying down. °Move around often to prevent stiffness and to lessen swelling. °Do not sit or stand for long periods of time. °Wear support stockings as told by your health care provider. °Medicines °Take over-the-counter and prescription medicines only as told by your health care provider. °Your health care provider may prescribe medicine to help your body get rid of excess water (diuretic). °General instructions °Pay attention to any changes in your symptoms. °Follow instructions from your health care provider about limiting salt (sodium) in your diet. Sometimes, eating less salt may reduce swelling. °Moisturize skin daily to help prevent skin from cracking and draining. °Keep all follow-up visits as told by your health care provider. This is important. °Contact a health care provider if you have: °A fever. °Edema that starts suddenly or is getting worse, especially if you are pregnant or have a medical condition. °Swelling in only one leg. °Increased swelling, redness, or pain in  one or both of your legs. °Drainage or sores at the area where you have edema. °Get help right away if you: °Develop shortness of breath, especially when you are lying down. °Have pain in your chest or abdomen. °Feel weak. °Feel faint. °Summary °Peripheral edema is swelling that is caused by a buildup of fluid. Peripheral edema most often affects the lower legs, ankles, and feet. °Move around often to prevent stiffness and to lessen swelling. Do not sit or stand for long periods of time. °Pay attention to any changes in your symptoms. °Contact a health care provider if you have edema that starts suddenly or is getting worse, especially if you are pregnant or have a medical condition. °Get help right away if you develop shortness of breath, especially when lying down. °This information is not intended to replace advice given to you by your health care provider. Make sure you discuss any questions you have with your health care provider. °Document Revised: 04/16/2021 Document Reviewed: 08/09/2018 °Elsevier Patient Education © 2022 [employer] ° °

## 2021-10-17 LAB — CBC WITH DIFFERENTIAL/PLATELET
Basophils Absolute: 0.1 10*3/uL (ref 0.0–0.2)
Basos: 1 %
EOS (ABSOLUTE): 0.3 10*3/uL (ref 0.0–0.4)
Eos: 6 %
Hematocrit: 41.1 % (ref 34.0–46.6)
Hemoglobin: 13.7 g/dL (ref 11.1–15.9)
Immature Grans (Abs): 0 10*3/uL (ref 0.0–0.1)
Immature Granulocytes: 0 %
Lymphocytes Absolute: 1.7 10*3/uL (ref 0.7–3.1)
Lymphs: 34 %
MCH: 31.4 pg (ref 26.6–33.0)
MCHC: 33.3 g/dL (ref 31.5–35.7)
MCV: 94 fL (ref 79–97)
Monocytes Absolute: 0.4 10*3/uL (ref 0.1–0.9)
Monocytes: 8 %
Neutrophils Absolute: 2.5 10*3/uL (ref 1.4–7.0)
Neutrophils: 51 %
Platelets: 169 10*3/uL (ref 150–450)
RBC: 4.37 x10E6/uL (ref 3.77–5.28)
RDW: 13 % (ref 11.7–15.4)
WBC: 5 10*3/uL (ref 3.4–10.8)

## 2021-10-17 LAB — LIPID PANEL
Chol/HDL Ratio: 3 ratio (ref 0.0–4.4)
Cholesterol, Total: 201 mg/dL — ABNORMAL HIGH (ref 100–199)
HDL: 68 mg/dL
LDL Chol Calc (NIH): 112 mg/dL — ABNORMAL HIGH (ref 0–99)
Triglycerides: 118 mg/dL (ref 0–149)
VLDL Cholesterol Cal: 21 mg/dL (ref 5–40)

## 2021-10-17 LAB — CMP14+EGFR
ALT: 12 IU/L (ref 0–32)
AST: 24 IU/L (ref 0–40)
Albumin/Globulin Ratio: 1.6 (ref 1.2–2.2)
Albumin: 4.4 g/dL (ref 3.6–4.6)
Alkaline Phosphatase: 54 IU/L (ref 44–121)
BUN/Creatinine Ratio: 12 (ref 12–28)
BUN: 11 mg/dL (ref 8–27)
Bilirubin Total: 0.8 mg/dL (ref 0.0–1.2)
CO2: 22 mmol/L (ref 20–29)
Calcium: 9.9 mg/dL (ref 8.7–10.3)
Chloride: 102 mmol/L (ref 96–106)
Creatinine, Ser: 0.94 mg/dL (ref 0.57–1.00)
Globulin, Total: 2.7 g/dL (ref 1.5–4.5)
Glucose: 130 mg/dL — ABNORMAL HIGH (ref 70–99)
Potassium: 4.5 mmol/L (ref 3.5–5.2)
Sodium: 141 mmol/L (ref 134–144)
Total Protein: 7.1 g/dL (ref 6.0–8.5)
eGFR: 59 mL/min/{1.73_m2} — ABNORMAL LOW (ref >59)

## 2021-10-17 LAB — THYROID PANEL WITH TSH
Free Thyroxine Index: 3 (ref 1.2–4.9)
T3 Uptake Ratio: 34 % (ref 24–39)
T4, Total: 8.9 ug/dL (ref 4.5–12.0)
TSH: 1.6 u[IU]/mL (ref 0.450–4.500)

## 2021-11-16 ENCOUNTER — Telehealth: Payer: Self-pay | Admitting: Nurse Practitioner

## 2021-11-16 DIAGNOSIS — I4821 Permanent atrial fibrillation: Secondary | ICD-10-CM

## 2021-11-16 MED ORDER — RIVAROXABAN 20 MG PO TABS: ORAL_TABLET | ORAL | 0 refills | Status: DC

## 2021-11-16 NOTE — Telephone Encounter (Signed)
°  Prescription Request  11/16/2021  Is this a "Controlled Substance" medicine? no  Have you seen your PCP in the last 2 weeks? no  If YES, route message to pool  -  If NO, patient needs to be scheduled for appointment.  What is the name of the medication or equipment? rivaroxaban (XARELTO) 20 MG TABS tablet  Have you contacted your pharmacy to request a refill? no   Which pharmacy would you like this sent to? [facility]  Pt is out but needs a refill sent to local pharmacy   Patient notified that their request is being sent to the clinical staff for review and that they should receive a response within 2 business days.

## 2021-11-16 NOTE — Addendum Note (Signed)
Addended by: Antonietta Barcelona D on: 11/16/2021 04:57 PM   Modules accepted: Orders

## 2021-11-16 NOTE — Telephone Encounter (Signed)
Pt aware refill sent to pharmacy 

## 2021-11-27 ENCOUNTER — Ambulatory Visit (INDEPENDENT_AMBULATORY_CARE_PROVIDER_SITE_OTHER): Payer: Medicare Other

## 2021-11-27 DIAGNOSIS — Z0000 Encounter for general adult medical examination without abnormal findings: Secondary | ICD-10-CM | POA: Diagnosis not present

## 2021-11-27 NOTE — Progress Notes (Signed)
MEDICARE ANNUAL WELLNESS VISIT  11/27/2021  Telephone Visit Disclaimer This Medicare AWV was conducted by telephone due to national recommendations for restrictions regarding the COVID-19 Pandemic (e.g. social distancing).  I verified, using two identifiers, that I am speaking with Debra Campbell or their authorized healthcare agent. I discussed the limitations, risks, security, and privacy concerns of performing an evaluation and management service by telephone and the potential availability of an in-person appointment in the future. The patient expressed understanding and agreed to proceed.  Location of Patient: Home Location of Provider (nurse):  [facility]  Subjective:    Debra Campbell is a 87 y.o. female patient of Chevis Pretty, [place] who had a Medicare Annual Wellness Visit today via telephone. Debra Campbell is Retired and lives with their spouse. she has three children. she reports that she is socially active and does interact with friends/family regularly. she is not physically active and enjoys puzzels.  Patient Care Team: Chevis Pretty, FNP as PCP - General (Nurse Practitioner) Leonie Man, MD as PCP - Cardiology (Cardiology) Marti Sleigh, MD as Attending Physician (Gynecology) Latanya Maudlin, MD as Consulting Physician (Orthopedic Surgery) Leonie Man, MD as Consulting Physician (Cardiology) Ilean China, RN as Registered Nurse  Advanced Directives 11/27/2021 11/18/2020 11/14/2019 10/30/2018 10/09/2018 04/11/2018 10/26/2017  Does Patient Have a Medical Advance Directive? Yes No No Yes No Yes No  Type of Paramedic of [place];Living will - - [place];Living will - [place] -  Does patient want to make changes to medical advance directive? No - Patient declined - - No - Patient declined - - -  Copy of [place] in Chart? - - - No -  copy requested - No - copy requested -  Would patient like information on creating a medical advance directive? - No - Patient declined No - Patient declined - No - Patient declined - No - Patient declined  Pre-existing out of facility DNR order (yellow form or pink MOST form) - - - - - - -    Hospital Utilization Over the Past 12 Months: # of hospitalizations or ER visits: 0 # of surgeries: 0  Review of Systems    Patient reports that her overall health is unchanged compared to last year.  Negative except    Patient Reported Readings (BP, Pulse, CBG, Weight, etc) none  Pain Assessment Pain : No/denies pain     Current Medications & Allergies (verified) Allergies as of 11/27/2021       Reactions   Ace Inhibitors Cough   Iohexol     Desc: HIVES 40 YEARS AGO   Statins    myalgia   Penicillins Swelling, Rash   Has patient had a PCN reaction causing immediate rash, facial/tongue/throat swelling, SOB or lightheadedness with hypotension: Yes Has patient had a PCN reaction causing severe rash involving mucus membranes or skin necrosis: No Has patient had a PCN reaction that required hospitalization Yes Has patient had a PCN reaction occurring within the last 10 years: No If all of the above answers are "NO", then may proceed with Cephalosporin use.   Sulfa Antibiotics Rash        Medication List        Accurate as of November 27, 2021 10:12 AM. If you have any questions, ask your nurse or doctor.          Accu-Chek Guide test strip Generic drug: glucose blood  CHECK BLOOD SUGAR ONCE DAILY DX E11.42   Accu-Chek Softclix Lancets lancets CHECK BLOOD SUGAR ONCE DAILY DX E11.42   acetaminophen 500 MG tablet Commonly known as: TYLENOL Take 1 tablet (500 mg total) by mouth every 8 (eight) hours as needed.   blood glucose meter kit and supplies Dispense based on patient and insurance preference. Use up to four times daily as directed. (FOR ICD-10 E10.9, E11.9).    furosemide 40 MG tablet Commonly known as: LASIX Take 2 tablets (80 mg total) by mouth 2 (two) times daily. MAY TAKE AN ADDITIONAL 40 MG IF WEIGHT GAIN OF 3 LBS OR MORE OVER NIGHT   gabapentin 300 MG capsule Commonly known as: NEURONTIN Take 1 capsule (300 mg total) by mouth 2 (two) times daily.   levothyroxine 125 MCG tablet Commonly known as: Synthroid Take 1 tablet (125 mcg total) by mouth daily.   metFORMIN 500 MG tablet Commonly known as: GLUCOPHAGE Take 1 tablet (500 mg total) by mouth 2 (two) times daily with a meal.   metoprolol succinate 25 MG 24 hr tablet Commonly known as: TOPROL-XL Take 1 tablet (25 mg total) by mouth daily.   nystatin cream Commonly known as: MYCOSTATIN Apply 1 application topically 2 (two) times daily.   rivaroxaban 20 MG Tabs tablet Commonly known as: Xarelto TAKE 1 TABLET EVERY DAY WITH BREAKFAST   triamcinolone cream 0.5 % Commonly known as: KENALOG APPLY 1 APPLICATION TOPICALLY THREE TIMES DAILY AS NEEDED.        History (reviewed): Past Medical History:  Diagnosis Date   Aortic calcification ([place]) 2013   Atherosclerotic calcifications of the abdominal aorta and branch noted on CT Abd/Pelvis   Arthritis    legs, lower back, hands   Cancer (HCC)    endometrial   Diabetes mellitus    Ectopic pregnancy    Eczema    Headache(784.0)    hx of   History of kidney stones    Hyperlipidemia    no meds   Hypertension    Hypothyroidism    Persistent atrial fibrillation ([place]) 09/28/2016   Relatively new Dx: Asymptomatic.  Rate controlled without medication.   SVD (spontaneous vaginal delivery)    x 3   Thyroid disease    Past Surgical History:  Procedure Laterality Date   ABDOMINAL HYSTERECTOMY Bilateral 06/26/2013   Procedure: TOTAL ABDOMINAL HYSTERECTOMY WITH BILATERAL SALPINGO OOPHERECTOMY WITH PELVIC LYMPHADNECTOMY;  Surgeon: Alvino Chapel, MD;  Location: [facility];  Service: Gynecology;  Laterality: Bilateral;   BACK  SURGERY  06/26/2013   ECTOPIC PREGNANCY SURGERY     HERNIA REPAIR     HYSTEROSCOPY WITH D & C N/A 04/26/2013   Procedure: DILATATION AND CURETTAGE /HYSTEROSCOPY;  Surgeon: Woodroe Mode, MD;  Location: [facility];  Service: Gynecology;  Laterality: N/A;   JOINT REPLACEMENT     LAPAROTOMY     for ectopic pregnancy   LAPAROTOMY N/A 06/26/2013   Procedure: EXPLORATORY LAPAROTOMY;  Surgeon: Alvino Chapel, MD;  Location: [facility];  Service: Gynecology;  Laterality: N/A;   lt knee arthroscopy     rt total hip replacement     TONSILLECTOMY     TOTAL KNEE ARTHROPLASTY Left 12/22/2016   Procedure: LEFT TOTAL KNEE ARTHROPLASTY;  Surgeon: Latanya Maudlin, MD;  Location: [facility];  Service: Orthopedics;  Laterality: Left;   TRANSTHORACIC ECHOCARDIOGRAM  09/2018   EF 60-65%. Mild AS.  Mod-Severe PAH 60 mmHg   WISDOM TOOTH EXTRACTION     Family History  Problem  Relation Age of Onset   Heart disease Father    Thyroid disease Father    Heart attack Father    Heart disease Sister        open heart surgery   Cancer Sister        breast   Neuropathy Sister        secondary to cancer treatment   Breast cancer Sister    Suicidality Brother 30   Arthritis Mother    Cancer Daughter    Breast cancer Daughter    Social History   Socioeconomic History   Marital status: Married    Spouse name: Not on file   Number of children: 3   Years of education: Not on file   Highest education level: High school graduate  Occupational History   Occupation: Retired    Comment: [employer]  Tobacco Use   Smoking status: Never   Smokeless tobacco: Never  Vaping Use   Vaping Use: Never used  Substance and Sexual Activity   Alcohol use: No   Drug use: No   Sexual activity: Not Currently    Birth control/protection: Post-menopausal  Other Topics Concern   Not on file  Social History Narrative   Not on file   Social Determinants of Health   Financial Resource Strain: Not on file  Food Insecurity: Not on  file  Transportation Needs: Not on file  Physical Activity: Not on file  Stress: Not on file  Social Connections: Not on file    Activities of Daily Living In your present state of health, do you have any difficulty performing the following activities: 11/27/2021  Hearing? N  Vision? N  Difficulty concentrating or making decisions? N  Walking or climbing stairs? Y  Dressing or bathing? N  Doing errands, shopping? Y  Preparing Food and eating ? N  Using the Toilet? N  In the past six months, have you accidently leaked urine? Y  Do you have problems with loss of bowel control? Y  Managing your Medications? N  Managing your Finances? N  Housekeeping or managing your Housekeeping? Y  Some recent data might be hidden    Patient Education/ Literacy How often do you need to have someone help you when you read instructions, pamphlets, or other written materials from your doctor or pharmacy?: 3 - Sometimes What is the last grade level you completed in school?: 12th  Exercise Current Exercise Habits: The patient does not participate in regular exercise at present  Diet Patient reports consuming 2 meals a day and 2 snack(s) a day Patient reports that her primary diet is: Regular Patient reports that she does have regular access to food.   Depression Screen PHQ 2/9 Scores 10/16/2021 07/16/2021 04/14/2021 01/15/2021 11/18/2020 10/14/2020 07/14/2020  PHQ - 2 Score 0 0 0 0 0 0 0  PHQ- 9 Score - - 0 - - - -     Fall Risk Fall Risk  11/27/2021 10/16/2021 07/16/2021 04/14/2021 01/15/2021  Falls in the past year? 0 0 0 0 0  Comment - - - - -  Number falls in past yr: - - - - -  Injury with Fall? - - - - -  Risk for fall due to : - - - - -  Follow up - - - - -     Objective:  Debra Campbell seemed alert and oriented and she participated appropriately during our telephone visit.  Blood Pressure Weight BMI  BP Readings from Last 3  Encounters:  10/16/21 (!) 144/78  07/16/21 139/84   04/14/21 132/70   Wt Readings from Last 3 Encounters:  10/16/21 204 lb (92.5 kg)  07/16/21 205 lb (93 kg)  04/14/21 197 lb (89.4 kg)   BMI Readings from Last 1 Encounters:  10/16/21 35.02 kg/m    *Unable to obtain current vital signs, weight, and BMI due to telephone visit type  Hearing/Vision  Debra Campbell did not seem to have difficulty with hearing/understanding during the telephone conversation Reports that she has not had a formal eye exam by an eye care professional within the past year Reports that she has not had a formal hearing evaluation within the past year *Unable to fully assess hearing and vision during telephone visit type  Cognitive Function: 6CIT Screen 11/27/2021 11/18/2020 11/14/2019  What Year? 0 points 0 points 0 points  What month? 0 points 0 points 0 points  What time? 0 points 0 points 0 points  Count back from 20 0 points 0 points 0 points  Months in reverse 0 points 2 points 0 points  Repeat phrase 0 points 2 points 0 points  Total Score 0 4 0   (Normal:0-7, Significant for Dysfunction: >8)  Normal Cognitive Function Screening: Yes   Immunization & Health Maintenance Record Immunization History  Administered Date(s) Administered   Fluad Quad(high Dose 65+) 08/29/2019, 10/14/2020, 10/16/2021   Influenza, High Dose Seasonal PF 09/28/2016, 10/06/2017, 10/17/2018   Influenza,inj,Quad PF,6+ Mos 09/26/2013, 09/09/2014, 10/13/2015   Moderna Sars-Covid-2 Vaccination 12/22/2019, 01/19/2020, 11/10/2020   Pneumococcal Conjugate-13 05/01/2015   Pneumococcal Polysaccharide-23 03/29/2002   Td 10/29/2008, 08/31/2011   Tdap 08/31/2011   Zoster Recombinat (Shingrix) 10/30/2018, 01/15/2021    Health Maintenance  Topic Date Due   MAMMOGRAM  12/06/2019   COVID-19 Vaccine (4 - Booster for Moderna series) 01/05/2021   OPHTHALMOLOGY EXAM  07/23/2021   URINE MICROALBUMIN  01/15/2022   TETANUS/TDAP  10/16/2022 (Originally 08/30/2021)   HEMOGLOBIN A1C  04/15/2022    FOOT EXAM  10/16/2022   Pneumonia Vaccine 55+ Years old  Completed   INFLUENZA VACCINE  Completed   DEXA SCAN  Completed   Zoster Vaccines- Shingrix  Completed   HPV VACCINES  Aged Out       Assessment  This is a routine wellness examination for Debra Campbell.  Health Maintenance: Due or Overdue Health Maintenance Due  Topic Date Due   MAMMOGRAM  12/06/2019   COVID-19 Vaccine (4 - Booster for Moderna series) 01/05/2021   OPHTHALMOLOGY EXAM  07/23/2021   URINE MICROALBUMIN  01/15/2022    Debra Campbell does not need a referral for Community Assistance: Care Management:   no Social Work:    no Prescription Assistance:  no Nutrition/Diabetes Education:  no   Plan:  Personalized Goals  Goals Addressed               This Visit's Progress     Patient Stated     I want to stop taking potassium (pt-stated)   On track     Current Barriers:  Knowledge Deficits related to need for potassium supplement  Nurse Case Manager Clinical Goal(s):  Over the next 30 days, patient will work with RN Care Manager/PCP to determine if potassium supplementation is necessary  Interventions:  Medication list reviewed and discussed potassium 39mq once weekly Talked with patient Patient advised that insurance will no longer cover potassium supplement with current directions of once weekly. Recommended OTC.  Chart reviewed including recent lab results with normal potassium  level Collaboration with PCP regarding need for supplement Advised patient that she can discontinue potassium Advised that we will rck potassium level at next visit on 12/03/2018  Patient Self Care Activities:  Performs ADL's independently Performs IADL's independently   Initial goal documentation       Other     AWV   On track     11/14/2019 AWV Goal: Fall Prevention  Over the next year, patient will decrease their risk for falls by: Using assistive devices, such as a cane or walker, as needed Identifying  fall risks within their home and correcting them by: Removing throw rugs Adding handrails to stairs or ramps Removing clutter and keeping a clear pathway throughout the home Increasing light, especially at night Adding shower handles/bars Raising toilet seat Identifying potential personal risk factors for falls: Medication side effects Incontinence/urgency Vestibular dysfunction Hearing loss Musculoskeletal disorders Neurological disorders Orthostatic hypotension        Have 3 meals a day   No change     Increase physical activity   Not on track     Try to do chair exercises daily - refer to handout given today       Personalized Health Maintenance & Screening Recommendations  Td vaccine Screening mammography Ophthalmology Exam  Lung Cancer Screening Recommended: no (Low Dose CT Chest recommended if Age 63-80 years, 30 pack-year currently smoking OR have quit w/in past 15 years) Hepatitis C Screening recommended: no HIV Screening recommended: no  Advanced Directives: Written information was not prepared per patient's request.  Referrals & Orders No orders of the defined types were placed in this encounter.   Follow-up Plan Follow-up with Chevis Pretty, FNP as planned Schedule 01/15/2022    I have personally reviewed and noted the following in the patients chart:   Medical and social history Use of alcohol, tobacco or illicit drugs  Current medications and supplements Functional ability and status Nutritional status Physical activity Advanced directives List of other physicians Hospitalizations, surgeries, and ER visits in previous 12 months Vitals Screenings to include cognitive, depression, and falls Referrals and appointments  In addition, I have reviewed and discussed with Debra Campbell certain preventive protocols, quality metrics, and best practice recommendations. A written personalized care plan for preventive services as well as general  preventive health recommendations is available and can be mailed to the patient at her request.      [facility]aud [facility]  24/07/7352

## 2021-11-27 NOTE — Patient Instructions (Signed)
[place] Maintenance Summary and Written Plan of Care  Debra Campbell ,  Thank you for allowing me to perform your Medicare Annual Wellness Visit and for your ongoing commitment to your health.   Health Maintenance & Immunization History Health Maintenance  Topic Date Due   MAMMOGRAM  12/06/2019   COVID-19 Vaccine (4 - Booster for Moderna series) 01/05/2021   OPHTHALMOLOGY EXAM  07/23/2021   URINE MICROALBUMIN  01/15/2022   TETANUS/TDAP  10/16/2022 (Originally 08/30/2021)   HEMOGLOBIN A1C  04/15/2022   FOOT EXAM  10/16/2022   Pneumonia Vaccine 28+ Years old  Completed   INFLUENZA VACCINE  Completed   DEXA SCAN  Completed   Zoster Vaccines- Shingrix  Completed   HPV VACCINES  Aged Out   Immunization History  Administered Date(s) Administered   Fluad Quad(high Dose 65+) 08/29/2019, 10/14/2020, 10/16/2021   Influenza, High Dose Seasonal PF 09/28/2016, 10/06/2017, 10/17/2018   Influenza,inj,Quad PF,6+ Mos 09/26/2013, 09/09/2014, 10/13/2015   Moderna Sars-Covid-2 Vaccination 12/22/2019, 01/19/2020, 11/10/2020   Pneumococcal Conjugate-13 05/01/2015   Pneumococcal Polysaccharide-23 03/29/2002   Td 10/29/2008, 08/31/2011   Tdap 08/31/2011   Zoster Recombinat (Shingrix) 10/30/2018, 01/15/2021    These are the patient goals that we discussed:  Goals Addressed               This Visit's Progress     Patient Stated     I want to stop taking potassium (pt-stated)   On track     Current Barriers:  Knowledge Deficits related to need for potassium supplement  Nurse Case Manager Clinical Goal(s):  Over the next 30 days, patient will work with RN Care Manager/PCP to determine if potassium supplementation is necessary  Interventions:  Medication list reviewed and discussed potassium 29meq once weekly Talked with patient Patient advised that insurance will no longer cover potassium supplement with current directions of once weekly. Recommended OTC.   Chart reviewed including recent lab results with normal potassium level Collaboration with PCP regarding need for supplement Advised patient that she can discontinue potassium Advised that we will rck potassium level at next visit on 12/03/2018  Patient Self Care Activities:  Performs ADL's independently Performs IADL's independently   Initial goal documentation       Other     AWV   On track     11/14/2019 AWV Goal: Fall Prevention  Over the next year, patient will decrease their risk for falls by: Using assistive devices, such as a cane or walker, as needed Identifying fall risks within their home and correcting them by: Removing throw rugs Adding handrails to stairs or ramps Removing clutter and keeping a clear pathway throughout the home Increasing light, especially at night Adding shower handles/bars Raising toilet seat Identifying potential personal risk factors for falls: Medication side effects Incontinence/urgency Vestibular dysfunction Hearing loss Musculoskeletal disorders Neurological disorders Orthostatic hypotension        Have 3 meals a day   No change     Increase physical activity   Not on track     Try to do chair exercises daily - refer to handout given today         This is a list of Health Maintenance Items that are overdue or due now: Health Maintenance Due  Topic Date Due   MAMMOGRAM  12/06/2019   COVID-19 Vaccine (4 - Booster for Moderna series) 01/05/2021   OPHTHALMOLOGY EXAM  07/23/2021   URINE MICROALBUMIN  01/15/2022     Orders/Referrals Placed Today:  No orders of the defined types were placed in this encounter.  (Contact our referral department at [phone] if you have not spoken with someone about your referral appointment within the next 5 days)    Follow-up Plan  Scheduled with Shelah Lewandowsky, FNP on 01/15/2022 at 10:30am

## 2021-11-28 ENCOUNTER — Emergency Department (HOSPITAL_COMMUNITY)
Admission: EM | Admit: 2021-11-28 | Discharge: 2021-11-29 | Disposition: A | Discharge status: Home / Self Care | Payer: Medicare Other | Attending: Emergency Medicine | Admitting: Emergency Medicine

## 2021-11-28 ENCOUNTER — Encounter (HOSPITAL_COMMUNITY): Payer: Self-pay

## 2021-11-28 ENCOUNTER — Other Ambulatory Visit: Payer: Self-pay

## 2021-11-28 ENCOUNTER — Emergency Department (HOSPITAL_COMMUNITY): Payer: Medicare Other

## 2021-11-28 DIAGNOSIS — Z7901 Long term (current) use of anticoagulants: Secondary | ICD-10-CM | POA: Insufficient documentation

## 2021-11-28 DIAGNOSIS — N200 Calculus of kidney: Secondary | ICD-10-CM | POA: Diagnosis not present

## 2021-11-28 DIAGNOSIS — M1612 Unilateral primary osteoarthritis, left hip: Secondary | ICD-10-CM | POA: Diagnosis not present

## 2021-11-28 DIAGNOSIS — N202 Calculus of kidney with calculus of ureter: Secondary | ICD-10-CM | POA: Insufficient documentation

## 2021-11-28 DIAGNOSIS — N132 Hydronephrosis with renal and ureteral calculous obstruction: Secondary | ICD-10-CM | POA: Diagnosis not present

## 2021-11-28 DIAGNOSIS — N201 Calculus of ureter: Secondary | ICD-10-CM

## 2021-11-28 DIAGNOSIS — Z87442 Personal history of urinary calculi: Secondary | ICD-10-CM | POA: Diagnosis not present

## 2021-11-28 DIAGNOSIS — R109 Unspecified abdominal pain: Secondary | ICD-10-CM | POA: Diagnosis present

## 2021-11-28 DIAGNOSIS — M545 Low back pain, unspecified: Secondary | ICD-10-CM | POA: Diagnosis not present

## 2021-11-28 DIAGNOSIS — K5730 Diverticulosis of large intestine without perforation or abscess without bleeding: Secondary | ICD-10-CM | POA: Diagnosis not present

## 2021-11-28 DIAGNOSIS — Z049 Encounter for examination and observation for unspecified reason: Secondary | ICD-10-CM | POA: Diagnosis not present

## 2021-11-28 DIAGNOSIS — M4186 Other forms of scoliosis, lumbar region: Secondary | ICD-10-CM | POA: Diagnosis not present

## 2021-11-28 DIAGNOSIS — R1032 Left lower quadrant pain: Secondary | ICD-10-CM | POA: Diagnosis not present

## 2021-11-28 LAB — CBC
HCT: 37.2 % (ref 36.0–46.0)
Hemoglobin: 12.3 g/dL (ref 12.0–15.0)
MCH: 32.4 pg (ref 26.0–34.0)
MCHC: 33.1 g/dL (ref 30.0–36.0)
MCV: 97.9 fL (ref 80.0–100.0)
Platelets: 148 10*3/uL — ABNORMAL LOW (ref 150–400)
RBC: 3.8 MIL/uL — ABNORMAL LOW (ref 3.87–5.11)
RDW: 13.6 % (ref 11.5–15.5)
WBC: 6.6 10*3/uL (ref 4.0–10.5)
nRBC: 0 % (ref 0.0–0.2)

## 2021-11-28 LAB — COMPREHENSIVE METABOLIC PANEL
ALT: 13 U/L (ref 0–44)
AST: 20 U/L (ref 15–41)
Albumin: 3.7 g/dL (ref 3.5–5.0)
Alkaline Phosphatase: 47 U/L (ref 38–126)
Anion gap: 9 (ref 5–15)
BUN: 14 mg/dL (ref 8–23)
CO2: 21 mmol/L — ABNORMAL LOW (ref 22–32)
Calcium: 8.6 mg/dL — ABNORMAL LOW (ref 8.9–10.3)
Chloride: 107 mmol/L (ref 98–111)
Creatinine, Ser: 0.8 mg/dL (ref 0.44–1.00)
GFR, Estimated: >60 mL/min (ref >60)
Glucose, Bld: 205 mg/dL — ABNORMAL HIGH (ref 70–99)
Potassium: 3.8 mmol/L (ref 3.5–5.1)
Sodium: 137 mmol/L (ref 135–145)
Total Bilirubin: 0.8 mg/dL (ref 0.3–1.2)
Total Protein: 6.9 g/dL (ref 6.5–8.1)

## 2021-11-28 LAB — LIPASE, BLOOD: Lipase: 50 U/L (ref 11–51)

## 2021-11-28 NOTE — ED Triage Notes (Signed)
Pt arrives EMS from home with sudden left flank pain radiating to her back. Says it feels like past kidney stones. EMS administered 4mg  zofran and 166mcg fentanyl prior to arrival.

## 2021-11-28 NOTE — ED Provider Notes (Signed)
Emergency Medicine Provider Triage Evaluation Note  Debra Campbell , a 87 y.o. female  was evaluated in triage.  Pt complains of left lower quadrant abdominal pain.  Goes into her back.  States she has history of kidney stones.  Noted darker urine as well as multiple episodes of diarrhea earlier today.  No history of diverticulitis.  Denies any melena right upper rectum. No recent abx, travel or sick contacts. Has had some nausea without vomiting.  Unsure if she is having fevers.  She is unsure if this feels similar to her prior kidney stones.  Denies any history of AAA or dissection.  She is chronically anticoagulated.  Review of Systems  Positive: Abd pain, back pain, diarrhea Negative: Fever, emesis  Physical Exam  BP (!) 170/79 (BP Location: Right Arm)    Pulse 96    Temp 98.3 F (36.8 C) (Oral)    Resp 18    SpO2 94%  Gen:   Awake, no distress   Resp:  Normal effort  ABD:  Diffusely tender to left abdomen, Neg CVA tap bl MSK:   Moves extremities without difficulty  Other:    Medical Decision Making  Medically screening exam initiated at 10:46 PM.  Appropriate orders placed.  Debra Campbell was informed that the remainder of the evaluation will be completed by another provider, this initial triage assessment does not replace that evaluation, and the importance of remaining in the ED until their evaluation is complete.  Abd pain, diarrhea, dark urine   Broly Hatfield A, PA-C 11/28/21 2248    Valarie Merino, MD 12/01/21 1044

## 2021-11-29 DIAGNOSIS — N202 Calculus of kidney with calculus of ureter: Secondary | ICD-10-CM | POA: Diagnosis not present

## 2021-11-29 DIAGNOSIS — R109 Unspecified abdominal pain: Secondary | ICD-10-CM | POA: Diagnosis present

## 2021-11-29 DIAGNOSIS — Z7901 Long term (current) use of anticoagulants: Secondary | ICD-10-CM | POA: Diagnosis not present

## 2021-11-29 LAB — URINALYSIS, ROUTINE W REFLEX MICROSCOPIC
Bacteria, UA: NONE SEEN
Bilirubin Urine: NEGATIVE
Glucose, UA: 150 mg/dL — AB
Ketones, ur: 5 mg/dL — AB
Leukocytes,Ua: NEGATIVE
Nitrite: NEGATIVE
Protein, ur: 30 mg/dL — AB
RBC / HPF: >50 RBC/hpf — ABNORMAL HIGH (ref 0–5)
Specific Gravity, Urine: 1.025 (ref 1.005–1.030)
pH: 5 (ref 5.0–8.0)

## 2021-11-29 MED ORDER — HYDROCODONE-ACETAMINOPHEN 5-325 MG PO TABS: 1.0000 | ORAL_TABLET | Freq: Four times a day (QID) | ORAL | 0 refills | Status: DC | PRN

## 2021-11-29 MED ORDER — ONDANSETRON 8 MG PO TBDP: 8.0000 mg | ORAL_TABLET | Freq: Three times a day (TID) | ORAL | 0 refills | Status: DC | PRN

## 2021-11-29 MED ORDER — KETOROLAC TROMETHAMINE 30 MG/ML IJ SOLN
15.0000 mg | Freq: Once | INTRAMUSCULAR | Status: AC
  Administered 2021-11-29: 15 mg via INTRAVENOUS
  Filled 2021-11-29: qty 1

## 2021-11-29 MED ORDER — ONDANSETRON HCL 4 MG/2ML IJ SOLN
4.0000 mg | Freq: Once | INTRAMUSCULAR | Status: AC
Start: 2021-11-29 — End: 2021-11-29
  Administered 2021-11-29: 4 mg via INTRAVENOUS
  Filled 2021-11-29: qty 2

## 2021-11-29 MED ORDER — TAMSULOSIN HCL 0.4 MG PO CAPS: 0.4000 mg | ORAL_CAPSULE | Freq: Every day | ORAL | 0 refills | Status: DC

## 2021-11-29 MED ORDER — FENTANYL CITRATE PF 50 MCG/ML IJ SOSY
50.0000 ug | PREFILLED_SYRINGE | Freq: Once | INTRAMUSCULAR | Status: AC
  Administered 2021-11-29: 50 ug via INTRAVENOUS
  Filled 2021-11-29: qty 1

## 2021-11-29 NOTE — ED Provider Notes (Signed)
[facility] DEPT Provider Note   CSN: [id number] Arrival date & time: 11/28/21  2231     History  Chief Complaint  Patient presents with   Flank Pain    JENNAE HAKEEM is a 88 y.o. female.  The history is provided by the patient and a relative.  Flank Pain This is a new problem. The current episode started 6 to 12 hours ago. The problem occurs constantly. The problem has been gradually worsening. Associated symptoms include abdominal pain. Pertinent negatives include no chest pain. Nothing aggravates the symptoms. Nothing relieves the symptoms. She has tried nothing for the symptoms.  Patient presents with left flank pain and abdominal pain.  This began after eating dinner.  She reports associated nausea without vomiting.  No fevers.  No urinary symptoms.     Home Medications Prior to Admission medications   Medication Sig Start Date End Date Taking? Authorizing Provider  Accu-Chek Softclix Lancets lancets CHECK BLOOD SUGAR ONCE DAILY DX E11.42 09/11/21   Hassell Done, [name], FNP  acetaminophen (TYLENOL) 500 MG tablet Take 1 tablet (500 mg total) by mouth every 8 (eight) hours as needed. 10/13/18   Florencia Reasons, MD  blood glucose meter kit and supplies Dispense based on patient and insurance preference. Use up to four times daily as directed. (FOR ICD-10 E10.9, E11.9). 03/04/20   Hassell Done [name], FNP  furosemide (LASIX) 40 MG tablet Take 2 tablets (80 mg total) by mouth 2 (two) times daily. MAY TAKE AN ADDITIONAL 40 MG IF WEIGHT GAIN OF 3 LBS OR MORE OVER NIGHT 04/14/21 07/13/21  Hassell Done, [name], FNP  gabapentin (NEURONTIN) 300 MG capsule Take 1 capsule (300 mg total) by mouth 2 (two) times daily. 07/16/21   Hassell Done [name], FNP  glucose blood (ACCU-CHEK GUIDE) test strip CHECK BLOOD SUGAR ONCE DAILY DX E11.42 06/08/21   Hassell Done [name], FNP  levothyroxine (SYNTHROID) 125 MCG tablet Take 1 tablet (125 mcg total) by mouth daily. 07/16/21    Hassell Done [name], FNP  metFORMIN (GLUCOPHAGE) 500 MG tablet Take 1 tablet (500 mg total) by mouth 2 (two) times daily with a meal. 07/16/21   Hassell Done, [name], FNP  metoprolol succinate (TOPROL-XL) 25 MG 24 hr tablet Take 1 tablet (25 mg total) by mouth daily. 07/16/21   Hassell Done, [name], FNP  nystatin cream (MYCOSTATIN) Apply 1 application topically 2 (two) times daily. 08/09/18   Terald Sleeper, PA-C  rivaroxaban (XARELTO) 20 MG TABS tablet TAKE 1 TABLET EVERY DAY WITH BREAKFAST 11/16/21   Hassell Done, [name], FNP  triamcinolone cream (KENALOG) 0.5 % APPLY 1 APPLICATION TOPICALLY THREE TIMES DAILY AS NEEDED. 02/27/19   Hassell Done, [name], FNP      Allergies    Ace inhibitors, Iohexol, Statins, Penicillins, and Sulfa antibiotics    Review of Systems   Review of Systems  Respiratory:  Negative for cough.   Cardiovascular:  Negative for chest pain.  Gastrointestinal:  Positive for abdominal pain.  Genitourinary:  Positive for flank pain.  All other systems reviewed and are negative.  Physical Exam Updated Vital Signs BP (!) 155/97 (BP Location: Left Arm)    Pulse 88    Temp 97.9 F (36.6 C)    Resp 16    SpO2 97%  Physical Exam CONSTITUTIONAL: Elderly, uncomfortable appearing HEAD: Normocephalic/atraumatic EYES: EOMI/PERRL ENMT: Mucous membranes moist NECK: supple no meningeal signs SPINE/BACK:entire spine nontender CV: S1/S2 noted, no murmurs/rubs/gallops noted LUNGS: Lungs are clear to auscultation bilaterally, no apparent distress ABDOMEN: soft, mild LLQ tenderness, no rebound or  guarding, bowel sounds noted throughout abdomen, obese GU: Left cva tenderness NEURO: Pt is awake/alert/appropriate, moves all extremitiesx4.  No facial droop.   EXTREMITIES: pulses normal/equal, full ROM SKIN: warm, color normal PSYCH: no abnormalities of mood noted, alert and oriented to situation  ED Results / Procedures / Treatments   Labs (all labs ordered are listed, but only  abnormal results are displayed) Labs Reviewed  URINALYSIS, ROUTINE W REFLEX MICROSCOPIC - Abnormal; Notable for the following components:      Result Value   APPearance CLOUDY (*)    Glucose, UA 150 (*)    Hgb urine dipstick LARGE (*)    Ketones, ur 5 (*)    Protein, ur 30 (*)    RBC / HPF >50 (*)    All other components within normal limits  CBC - Abnormal; Notable for the following components:   RBC 3.80 (*)    Platelets 148 (*)    All other components within normal limits  COMPREHENSIVE METABOLIC PANEL - Abnormal; Notable for the following components:   CO2 21 (*)    Glucose, Bld 205 (*)    Calcium 8.6 (*)    All other components within normal limits  LIPASE, BLOOD    EKG None  Radiology CT Abdomen Pelvis Wo Contrast  Result Date: 11/29/2021 CLINICAL DATA:  Left lower quadrant abdominal pain EXAM: CT ABDOMEN AND PELVIS WITHOUT CONTRAST TECHNIQUE: Multidetector CT imaging of the abdomen and pelvis was performed following the standard protocol without IV contrast. COMPARISON:  None. FINDINGS: Lower chest: Mild interstitial pulmonary edema noted within the visualized lung bases. Trace right pleural effusion. Moderate coronary artery calcification. Cardiac size is within normal limits. Hepatobiliary: No focal liver abnormality is seen. No gallstones, gallbladder wall thickening, or biliary dilatation. Pancreas: Unremarkable Spleen: Unremarkable Adrenals/Urinary Tract: The adrenal glands are unremarkable. The kidneys are normal in size and position. Mild asymmetric right lower polar renal cortical atrophy there is mild left hydronephrosis and perinephric stranding secondary to an obstructing 4 x 5 x 7 mm calculus within the distal left ureter roughly 3-4 cm proximal to the left ureterovesicular junction. There is superimposed bilateral nonobstructing nephrolithiasis measuring up to 8 mm within the interpolar region of the right kidney. No hydronephrosis on the right. No ureteral calculi on  the right. The bladder is partially obscured by streak artifact from right total hip arthroplasty, however, the visualized portion is unremarkable. Stomach/Bowel: Mild distal colonic diverticulosis. The stomach, small bowel, and large bowel are otherwise unremarkable. Appendix normal. No free intraperitoneal gas. Trace free fluid within the pelvis is nonspecific. Vascular/Lymphatic: Moderate aortoiliac atherosclerotic calcification. No aortic aneurysm. No pathologic adenopathy within the abdomen and pelvis. Reproductive: Status post hysterectomy. No adnexal masses. Other: No abdominal wall hernia or abnormality. No abdominopelvic ascites. Musculoskeletal: Right total hip arthroplasty has been performed. Severe left hip degenerative arthritis. Degenerative changes are seen within the lumbar spine with superimposed moderate lumbar dextroscoliosis. No acute bone abnormality. No lytic or blastic bone lesion. IMPRESSION: Obstructing 4 x 5 x 7 mm calculus within the distal left ureter resulting in mild left hydronephrosis. Superimposed moderate bilateral nonobstructing nephrolithiasis. Mild interstitial pulmonary edema. Moderate coronary artery calcification. Mild distal colonic diverticulosis. Aortic Atherosclerosis (ICD10-I70.0). Electronically Signed   By: Fidela Salisbury M.D.   On: 11/29/2021 00:12    Procedures Procedures    Medications Ordered in ED Medications  fentaNYL (SUBLIMAZE) injection 50 mcg (50 mcg Intravenous Given 11/29/21 0329)  ketorolac (TORADOL) 30 MG/ML injection 15 mg (15 mg Intravenous  Given 11/29/21 0330)  ondansetron (ZOFRAN) injection 4 mg (4 mg Intravenous Given 11/29/21 0343)    ED Course/ Medical Decision Making/ A&P                           Medical Decision Making  This patient presents to the ED for concern of flank pain, this involves an extensive number of treatment options, and is a complaint that carries with it a high risk of complications and morbidity.  The differential  diagnosis includes kidney stone, pyelonephritis, muscle strain, AAA, spinal pathology  Comorbidities that complicate the patient evaluation: Patients presentation is complicated by their history of diabetes and hypertension                      Additional history obtained: Additional history obtained from from daughter   Lab Tests: I Ordered, and personally interpreted labs.  The pertinent results include: Hematuria  Imaging Studies ordered: I ordered imaging studies including CT renal I independently visualized and interpreted imaging which showed left ureteral stone with hydronephrosis I agree with the radiologist interpretation    Medicines ordered and prescription drug management: I ordered medication including Toradol and fentanyl for pain Reevaluation of the patient after these medicines showed that the patient    improved    Reevaluation: After the interventions noted above, I reevaluated the patient and found that they have :improved  Complexity of problems addressed: Patients presentation is most consistent with acute uncomplicated illness  Disposition: After consideration of the diagnostic results and the patients response to treatment, I feel that the patent would benefit from discharge home and follow-up with urology.   Patient found to have ureteral stone, but no signs of infectious etiology.  Patient is improved in no acute distress. She feels comfortable for discharge home.  Due to size of stone, will prescribe medical expulsive therapy with Flomax, refer to urology.  We discussed strict  return precautions       Final Clinical Impression(s) / ED Diagnoses Final diagnoses:  Ureterolithiasis  Kidney stone    Rx / DC Orders ED Discharge Orders          Ordered    ondansetron (ZOFRAN-ODT) 8 MG disintegrating tablet  Every 8 hours PRN        11/29/21 0534    HYDROcodone-acetaminophen (NORCO/VICODIN) 5-325 MG tablet  Every 6 hours PRN         11/29/21 0534    tamsulosin (FLOMAX) 0.4 MG CAPS capsule  Daily after supper        11/29/21 0534              Ripley Fraise, MD 11/29/21 [phone]

## 2021-11-29 NOTE — ED Notes (Signed)
Called lab, they have pt urine

## 2021-12-04 ENCOUNTER — Telehealth: Payer: Self-pay | Admitting: Nurse Practitioner

## 2021-12-04 DIAGNOSIS — I4821 Permanent atrial fibrillation: Secondary | ICD-10-CM

## 2021-12-04 MED ORDER — RIVAROXABAN 20 MG PO TABS: ORAL_TABLET | ORAL | 0 refills | Status: DC

## 2021-12-04 NOTE — Telephone Encounter (Signed)
Pt aware refill sent to mail order pharmacy 

## 2021-12-04 NOTE — Telephone Encounter (Signed)
°  Prescription Request  12/04/2021  Is this a "Controlled Substance" medicine? no  Have you seen your PCP in the last 2 weeks? No   If YES, route message to pool  -  If NO, patient needs to be scheduled for appointment.  What is the name of the medication or equipment? Xeralto 10 mg- 3 month supply  Have you contacted your pharmacy to request a refill? NO   Which pharmacy would you like this sent to? [facility]   Patient notified that their request is being sent to the clinical staff for review and that they should receive a response within 2 business days.

## 2022-01-15 ENCOUNTER — Ambulatory Visit (INDEPENDENT_AMBULATORY_CARE_PROVIDER_SITE_OTHER): Payer: Medicare Other | Admitting: Nurse Practitioner

## 2022-01-15 ENCOUNTER — Encounter: Payer: Self-pay | Admitting: Nurse Practitioner

## 2022-01-15 ENCOUNTER — Other Ambulatory Visit: Payer: Self-pay | Admitting: *Deleted

## 2022-01-15 VITALS — BP 138/73 | HR 73 | Temp 98.2°F | Resp 20 | Ht 64.0 in | Wt 203.0 lb

## 2022-01-15 DIAGNOSIS — I152 Hypertension secondary to endocrine disorders: Secondary | ICD-10-CM

## 2022-01-15 DIAGNOSIS — I4821 Permanent atrial fibrillation: Secondary | ICD-10-CM | POA: Diagnosis not present

## 2022-01-15 DIAGNOSIS — E1142 Type 2 diabetes mellitus with diabetic polyneuropathy: Secondary | ICD-10-CM

## 2022-01-15 DIAGNOSIS — M8588 Other specified disorders of bone density and structure, other site: Secondary | ICD-10-CM | POA: Diagnosis not present

## 2022-01-15 DIAGNOSIS — I272 Pulmonary hypertension, unspecified: Secondary | ICD-10-CM

## 2022-01-15 DIAGNOSIS — R600 Localized edema: Secondary | ICD-10-CM

## 2022-01-15 DIAGNOSIS — E034 Atrophy of thyroid (acquired): Secondary | ICD-10-CM

## 2022-01-15 DIAGNOSIS — I700 Atherosclerosis of aorta: Secondary | ICD-10-CM

## 2022-01-15 DIAGNOSIS — R609 Edema, unspecified: Secondary | ICD-10-CM | POA: Diagnosis not present

## 2022-01-15 DIAGNOSIS — E1169 Type 2 diabetes mellitus with other specified complication: Secondary | ICD-10-CM | POA: Diagnosis not present

## 2022-01-15 DIAGNOSIS — E785 Hyperlipidemia, unspecified: Secondary | ICD-10-CM | POA: Diagnosis not present

## 2022-01-15 DIAGNOSIS — E1159 Type 2 diabetes mellitus with other circulatory complications: Secondary | ICD-10-CM | POA: Diagnosis not present

## 2022-01-15 LAB — BAYER DCA HB A1C WAIVED: HB A1C (BAYER DCA - WAIVED): 6.4 % — ABNORMAL HIGH (ref 4.8–5.6)

## 2022-01-15 MED ORDER — TAMSULOSIN HCL 0.4 MG PO CAPS: 0.4000 mg | ORAL_CAPSULE | Freq: Every day | ORAL | 0 refills | Status: DC

## 2022-01-15 MED ORDER — METOPROLOL SUCCINATE ER 25 MG PO TB24: 25.0000 mg | ORAL_TABLET | Freq: Every day | ORAL | 1 refills | Status: DC

## 2022-01-15 MED ORDER — METFORMIN HCL 500 MG PO TABS: 500.0000 mg | ORAL_TABLET | Freq: Two times a day (BID) | ORAL | 1 refills | Status: DC

## 2022-01-15 MED ORDER — GABAPENTIN 300 MG PO CAPS: 300.0000 mg | ORAL_CAPSULE | Freq: Two times a day (BID) | ORAL | 1 refills | Status: DC

## 2022-01-15 MED ORDER — LEVOTHYROXINE SODIUM 125 MCG PO TABS: 125.0000 ug | ORAL_TABLET | Freq: Every day | ORAL | 1 refills | Status: DC

## 2022-01-15 MED ORDER — FUROSEMIDE 40 MG PO TABS: 80.0000 mg | ORAL_TABLET | Freq: Two times a day (BID) | ORAL | 1 refills | Status: DC

## 2022-01-15 MED ORDER — RIVAROXABAN 20 MG PO TABS: ORAL_TABLET | ORAL | 1 refills | Status: DC

## 2022-01-15 NOTE — Patient Instructions (Signed)

## 2022-01-15 NOTE — Progress Notes (Addendum)
Subjective:    Patient ID: Debra Campbell, female    DOB: Apr 19, 1932, 88 y.o.   MRN: [id number]   Chief Complaint: medical management of chronic issues     HPI:  Debra Campbell is a 88 y.o. who identifies as a female who was assigned female at birth.   Social history: Lives with: husband Work history: retired   Scientist, forensic in today for follow up of the following chronic medical issues:  1. Hypertension associated with type 2 diabetes mellitus ([place]) No c/o chest pain, sob or headache. Does not check blood pressure at home. BP Readings from Last 3 Encounters:  11/29/21 (!) 153/91  10/16/21 (!) 144/78  07/16/21 139/84     2. Permanent atrial fibrillation ([place]) CHA2DS2Vasc = 5; Xarelto Denies palpitation or heart racing.  3. Aortic calcification ([place]) Last saw cardiology on 01/30/21. Review of office note showed no change in plan of care.  4. Pulmonary hypertension, unspecified ([place]  5. Hypothyroidism due to acquired atrophy of thyroid No problems that she is aware of Lab Results  Component Value Date   TSH 1.600 10/16/2021     6. Hyperlipidemia associated with type 2 diabetes mellitus ([place]) Does not watch diet and does no dedicated exercise. Lab Results  Component Value Date   CHOL 201 (H) 10/16/2021   HDL 68 10/16/2021   LDLCALC 112 (H) 10/16/2021   TRIG 118 10/16/2021   CHOLHDL 3.0 10/16/2021     7. Type 2 diabetes mellitus with diabetic polyneuropathy, without long-term current use of insulin (HCC) Fasting blood sugars are running from 100-10. She denies any low blood sugars Lab Results  Component Value Date   HGBA1C 5.8 (H) 10/16/2021     8. Diabetic polyneuropathy associated with type 2 diabetes mellitus (HCC) Has burning in bil feet daily. Is on neurontin and that seems to wok well.  9. Peripheral edema Has daily edema in lower ext.  10. Osteopenia of lumbar spine Last dexascan was done on 01/21/21. T score was -2.0  11. Morbid obesity ([place]) No  recent weight changes Wt Readings from Last 3 Encounters:  01/15/22 203 lb (92.1 kg)  10/16/21 204 lb (92.5 kg)  07/16/21 205 lb (93 kg)   BMI Readings from Last 3 Encounters:  01/15/22 34.84 kg/m  10/16/21 35.02 kg/m  07/16/21 35.19 kg/m     New complaints: None today  Allergies  Allergen Reactions   Ace Inhibitors Cough   Iohexol      Desc: HIVES 40 YEARS AGO    Statins     myalgia   Penicillins Swelling and Rash    Has patient had a PCN reaction causing immediate rash, facial/tongue/throat swelling, SOB or lightheadedness with hypotension: Yes Has patient had a PCN reaction causing severe rash involving mucus membranes or skin necrosis: No Has patient had a PCN reaction that required hospitalization Yes Has patient had a PCN reaction occurring within the last 10 years: No If all of the above answers are "NO", then may proceed with Cephalosporin use.    Sulfa Antibiotics Rash   Outpatient Encounter Medications as of 01/15/2022  Medication Sig   Accu-Chek Softclix Lancets lancets CHECK BLOOD SUGAR ONCE DAILY DX E11.42   acetaminophen (TYLENOL) 500 MG tablet Take 1 tablet (500 mg total) by mouth every 8 (eight) hours as needed.   blood glucose meter kit and supplies Dispense based on patient and insurance preference. Use up to four times daily as directed. (FOR ICD-10 E10.9, E11.9).   furosemide (  LASIX) 40 MG tablet Take 2 tablets (80 mg total) by mouth 2 (two) times daily. MAY TAKE AN ADDITIONAL 40 MG IF WEIGHT GAIN OF 3 LBS OR MORE OVER NIGHT   gabapentin (NEURONTIN) 300 MG capsule Take 1 capsule (300 mg total) by mouth 2 (two) times daily.   glucose blood (ACCU-CHEK GUIDE) test strip CHECK BLOOD SUGAR ONCE DAILY DX E11.42   HYDROcodone-acetaminophen (NORCO/VICODIN) 5-325 MG tablet Take 1 tablet by mouth every 6 (six) hours as needed.   levothyroxine (SYNTHROID) 125 MCG tablet Take 1 tablet (125 mcg total) by mouth daily.   metFORMIN (GLUCOPHAGE) 500 MG tablet Take 1  tablet (500 mg total) by mouth 2 (two) times daily with a meal.   metoprolol succinate (TOPROL-XL) 25 MG 24 hr tablet Take 1 tablet (25 mg total) by mouth daily.   nystatin cream (MYCOSTATIN) Apply 1 application topically 2 (two) times daily.   ondansetron (ZOFRAN-ODT) 8 MG disintegrating tablet Take 1 tablet (8 mg total) by mouth every 8 (eight) hours as needed.   rivaroxaban (XARELTO) 20 MG TABS tablet TAKE 1 TABLET EVERY DAY WITH BREAKFAST   tamsulosin (FLOMAX) 0.4 MG CAPS capsule Take 1 capsule (0.4 mg total) by mouth daily after supper.   triamcinolone cream (KENALOG) 0.5 % APPLY 1 APPLICATION TOPICALLY THREE TIMES DAILY AS NEEDED.   No facility-administered encounter medications on file as of 01/15/2022.    Past Surgical History:  Procedure Laterality Date   ABDOMINAL HYSTERECTOMY Bilateral 06/26/2013   Procedure: TOTAL ABDOMINAL HYSTERECTOMY WITH BILATERAL SALPINGO OOPHERECTOMY WITH PELVIC LYMPHADNECTOMY;  Surgeon: Alvino Chapel, MD;  Location: [facility];  Service: Gynecology;  Laterality: Bilateral;   BACK SURGERY  06/26/2013   ECTOPIC PREGNANCY SURGERY     HERNIA REPAIR     HYSTEROSCOPY WITH D & C N/A 04/26/2013   Procedure: DILATATION AND CURETTAGE /HYSTEROSCOPY;  Surgeon: Woodroe Mode, MD;  Location: [facility];  Service: Gynecology;  Laterality: N/A;   JOINT REPLACEMENT     LAPAROTOMY     for ectopic pregnancy   LAPAROTOMY N/A 06/26/2013   Procedure: EXPLORATORY LAPAROTOMY;  Surgeon: Alvino Chapel, MD;  Location: [facility];  Service: Gynecology;  Laterality: N/A;   lt knee arthroscopy     rt total hip replacement     TONSILLECTOMY     TOTAL KNEE ARTHROPLASTY Left 12/22/2016   Procedure: LEFT TOTAL KNEE ARTHROPLASTY;  Surgeon: Latanya Maudlin, MD;  Location: [facility];  Service: Orthopedics;  Laterality: Left;   TRANSTHORACIC ECHOCARDIOGRAM  09/2018   EF 60-65%. Mild AS.  Mod-Severe PAH 60 mmHg   WISDOM TOOTH EXTRACTION      Family History  Problem Relation Age of  Onset   Heart disease Father    Thyroid disease Father    Heart attack Father    Heart disease Sister        open heart surgery   Cancer Sister        breast   Neuropathy Sister        secondary to cancer treatment   Breast cancer Sister    Suicidality Brother 34   Arthritis Mother    Cancer Daughter    Breast cancer Daughter       Controlled substance contract: n/a     Review of Systems  Constitutional:  Negative for diaphoresis.  Eyes:  Negative for pain.  Respiratory:  Negative for shortness of breath.   Cardiovascular:  Negative for chest pain, palpitations and leg swelling.  Gastrointestinal:  Negative  for abdominal pain.  Endocrine: Negative for polydipsia.  Skin:  Negative for rash.  Neurological:  Negative for dizziness, weakness and headaches.  Hematological:  Does not bruise/bleed easily.  All other systems reviewed and are negative.     Objective:   Physical Exam Vitals and nursing note reviewed.  Constitutional:      General: She is not in acute distress.    Appearance: Normal appearance. She is well-developed.  HENT:     Head: Normocephalic.     Right Ear: Tympanic membrane normal.     Left Ear: Tympanic membrane normal.     Nose: Nose normal.     Mouth/Throat:     Mouth: Mucous membranes are moist.  Eyes:     Pupils: Pupils are equal, round, and reactive to light.  Neck:     Vascular: No carotid bruit or JVD.  Cardiovascular:     Rate and Rhythm: Normal rate. Rhythm irregular.     Heart sounds: Normal heart sounds.  Pulmonary:     Effort: Pulmonary effort is normal. No respiratory distress.     Breath sounds: Normal breath sounds. No wheezing or rales.  Chest:     Chest wall: No tenderness.  Abdominal:     General: Bowel sounds are normal. There is no distension or abdominal bruit.     Palpations: Abdomen is soft. There is no hepatomegaly, splenomegaly, mass or pulsatile mass.     Tenderness: There is no abdominal tenderness.   Musculoskeletal:        General: Normal range of motion.     Cervical back: Normal range of motion and neck supple.  Lymphadenopathy:     Cervical: No cervical adenopathy.  Skin:    General: Skin is warm and dry.  Neurological:     Mental Status: She is alert and oriented to person, place, and time.     Deep Tendon Reflexes: Reflexes are normal and symmetric.  Psychiatric:        Behavior: Behavior normal.        Thought Content: Thought content normal.        Judgment: Judgment normal.   BP 138/73    Pulse 73    Temp 98.2 F (36.8 C) (Oral)    Resp 20    Ht 5' 4"  (1.626 m)    Wt 203 lb (92.1 kg)    SpO2 99%    BMI 34.84 kg/m    Hgba1c 6.4%     Assessment & Plan:  Debra Campbell comes in today with chief complaint of Medical Management of Chronic Issues   Diagnosis and orders addressed:  1. Hypertension associated with type 2 diabetes mellitus (HCC) Low sodium diet - CBC with Differential/Platelet - CMP14+EGFR - metoprolol succinate (TOPROL-XL) 25 MG 24 hr tablet; Take 1 tablet (25 mg total) by mouth daily.  Dispense: 90 tablet; Refill: 1  2. Permanent atrial fibrillation ([place]) CHA2DS2Vasc = 5; Xarelto Report heart racing - rivaroxaban (XARELTO) 20 MG TABS tablet; TAKE 1 TABLET EVERY DAY WITH BREAKFAST  Dispense: 90 tablet; Refill: 1  3. Aortic calcification (HCC) 4. Pulmonary hypertension, unspecified ([place]) Keep yearly follow up with cardiology  5. Hypothyroidism due to acquired atrophy of thyroid Labs pending - Thyroid Panel With TSH - levothyroxine (SYNTHROID) 125 MCG tablet; Take 1 tablet (125 mcg total) by mouth daily.  Dispense: 90 tablet; Refill: 1  6. Hyperlipidemia associated with type 2 diabetes mellitus (HCC) Low fat diet - Lipid panel  7. Type 2 diabetes  mellitus with diabetic polyneuropathy, without long-term current use of insulin (HCC) Continue to watch carbs in diet - Bayer DCA Hb A1c Waived - metFORMIN (GLUCOPHAGE) 500 MG tablet; Take 1  tablet (500 mg total) by mouth 2 (two) times daily with a meal.  Dispense: 180 tablet; Refill: 1  8. Diabetic polyneuropathy associated with type 2 diabetes mellitus ([place]) Do not go barefooted - gabapentin (NEURONTIN) 300 MG capsule; Take 1 capsule (300 mg total) by mouth 2 (two) times daily.  Dispense: 180 capsule; Refill: 1  9. Peripheral edema Elevate legs when sitting - furosemide (LASIX) 40 MG tablet; Take 2 tablets (80 mg total) by mouth 2 (two) times daily. MAY TAKE AN ADDITIONAL 40 MG IF WEIGHT GAIN OF 3 LBS OR MORE OVER NIGHT  Dispense: 360 tablet; Refill: 1  10. Osteopenia of lumbar spine Weight bearing exercises encouraged  11. Morbid obesity ([place]) Discussed diet and exercise for person with BMI >25 Will recheck weight in 3-6 months    Labs pending Health Maintenance reviewed Diet and exercise encouraged  Follow up plan: 3 months   [name] Hassell Done, FNP

## 2022-01-16 LAB — CBC WITH DIFFERENTIAL/PLATELET
Basophils Absolute: 0 10*3/uL (ref 0.0–0.2)
Basos: 1 %
EOS (ABSOLUTE): 0.2 10*3/uL (ref 0.0–0.4)
Eos: 3 %
Hematocrit: 38 % (ref 34.0–46.6)
Hemoglobin: 12.9 g/dL (ref 11.1–15.9)
Immature Grans (Abs): 0 10*3/uL (ref 0.0–0.1)
Immature Granulocytes: 0 %
Lymphocytes Absolute: 1.7 10*3/uL (ref 0.7–3.1)
Lymphs: 38 %
MCH: 32 pg (ref 26.6–33.0)
MCHC: 33.9 g/dL (ref 31.5–35.7)
MCV: 94 fL (ref 79–97)
Monocytes Absolute: 0.5 10*3/uL (ref 0.1–0.9)
Monocytes: 10 %
Neutrophils Absolute: 2.2 10*3/uL (ref 1.4–7.0)
Neutrophils: 48 %
Platelets: 160 10*3/uL (ref 150–450)
RBC: 4.03 x10E6/uL (ref 3.77–5.28)
RDW: 13 % (ref 11.7–15.4)
WBC: 4.5 10*3/uL (ref 3.4–10.8)

## 2022-01-16 LAB — CMP14+EGFR
ALT: 12 IU/L (ref 0–32)
AST: 19 IU/L (ref 0–40)
Albumin/Globulin Ratio: 1.5 (ref 1.2–2.2)
Albumin: 4.2 g/dL (ref 3.6–4.6)
Alkaline Phosphatase: 56 IU/L (ref 44–121)
BUN/Creatinine Ratio: 12 (ref 12–28)
BUN: 13 mg/dL (ref 8–27)
Bilirubin Total: 0.9 mg/dL (ref 0.0–1.2)
CO2: 24 mmol/L (ref 20–29)
Calcium: 9.2 mg/dL (ref 8.7–10.3)
Chloride: 103 mmol/L (ref 96–106)
Creatinine, Ser: 1.06 mg/dL — ABNORMAL HIGH (ref 0.57–1.00)
Globulin, Total: 2.8 g/dL (ref 1.5–4.5)
Glucose: 149 mg/dL — ABNORMAL HIGH (ref 70–99)
Potassium: 4 mmol/L (ref 3.5–5.2)
Sodium: 142 mmol/L (ref 134–144)
Total Protein: 7 g/dL (ref 6.0–8.5)
eGFR: 51 mL/min/{1.73_m2} — ABNORMAL LOW (ref >59)

## 2022-01-16 LAB — LIPID PANEL
Chol/HDL Ratio: 3.2 ratio (ref 0.0–4.4)
Cholesterol, Total: 186 mg/dL (ref 100–199)
HDL: 59 mg/dL (ref >39)
LDL Chol Calc (NIH): 104 mg/dL — ABNORMAL HIGH (ref 0–99)
Triglycerides: 134 mg/dL (ref 0–149)
VLDL Cholesterol Cal: 23 mg/dL (ref 5–40)

## 2022-01-16 LAB — THYROID PANEL WITH TSH
Free Thyroxine Index: 2.6 (ref 1.2–4.9)
T3 Uptake Ratio: 31 % (ref 24–39)
T4, Total: 8.4 ug/dL (ref 4.5–12.0)
TSH: 1.15 u[IU]/mL (ref 0.450–4.500)

## 2022-01-25 ENCOUNTER — Other Ambulatory Visit: Payer: Self-pay | Admitting: Nurse Practitioner

## 2022-01-25 DIAGNOSIS — E1159 Type 2 diabetes mellitus with other circulatory complications: Secondary | ICD-10-CM

## 2022-01-25 DIAGNOSIS — I152 Hypertension secondary to endocrine disorders: Secondary | ICD-10-CM

## 2022-03-15 ENCOUNTER — Telehealth: Payer: Self-pay

## 2022-03-15 NOTE — Chronic Care Management (AMB) (Signed)
?  Care Management  ? ?Note ? ?03/15/2022 ?Name: JUNELLE HASHEMI MRN: [id number] DOB: 06-18-1932 ? ?SUSANNE BAUMGARNER is a 88 y.o. year old female who is a primary care patient of Chevis Pretty, [place]. I reached out to Gerre Pebbles by phone today offer care coordination services.  ? ?Ms. [place] was given information about care management services today including:  ?Care management services include personalized support from designated clinical staff supervised by her physician, including individualized plan of care and coordination with other care providers ?24/7 contact phone numbers for assistance for urgent and routine care needs. ?The patient may stop care management services at any time by phone call to the office staff. ? ?Patient did not agree to enrollment in care management services and does not wish to consider at this time. ? ?Follow up plan: ?Patient declines further follow up and engagement by the care management team. Appropriate care team members and provider have been notified via electronic communication.  ? ?Noreene Larsson, RMA ?Care Guide, Embedded Care Coordination ?[facility]  ?[place], [place] [address] ?Direct Dial: [phone] ?[email or web address] ?Website: [email or web address]  ? ?

## 2022-04-12 ENCOUNTER — Encounter: Payer: Self-pay | Admitting: Cardiology

## 2022-04-12 ENCOUNTER — Ambulatory Visit (INDEPENDENT_AMBULATORY_CARE_PROVIDER_SITE_OTHER): Payer: Medicare Other | Admitting: Cardiology

## 2022-04-12 VITALS — BP 140/78 | HR 83 | Ht 65.0 in | Wt 205.4 lb

## 2022-04-12 DIAGNOSIS — I4821 Permanent atrial fibrillation: Secondary | ICD-10-CM | POA: Diagnosis not present

## 2022-04-12 DIAGNOSIS — E785 Hyperlipidemia, unspecified: Secondary | ICD-10-CM

## 2022-04-12 DIAGNOSIS — E1169 Type 2 diabetes mellitus with other specified complication: Secondary | ICD-10-CM | POA: Diagnosis not present

## 2022-04-12 DIAGNOSIS — R609 Edema, unspecified: Secondary | ICD-10-CM | POA: Diagnosis not present

## 2022-04-12 DIAGNOSIS — I272 Pulmonary hypertension, unspecified: Secondary | ICD-10-CM

## 2022-04-12 DIAGNOSIS — E1159 Type 2 diabetes mellitus with other circulatory complications: Secondary | ICD-10-CM

## 2022-04-12 DIAGNOSIS — I350 Nonrheumatic aortic (valve) stenosis: Secondary | ICD-10-CM

## 2022-04-12 DIAGNOSIS — I700 Atherosclerosis of aorta: Secondary | ICD-10-CM

## 2022-04-12 DIAGNOSIS — I152 Hypertension secondary to endocrine disorders: Secondary | ICD-10-CM

## 2022-04-12 NOTE — Patient Instructions (Signed)

## 2022-04-12 NOTE — Progress Notes (Signed)
Primary Care Provider: Chevis Pretty, FNP Cardiologist: Debra Hew, MD Electrophysiologist: None  Clinic Note: Chief Complaint  Patient presents with   Follow-up    Annual   Atrial Fibrillation    Asymptomatic.  Rate controlled on current dose of Toprol 25 mg daily.  On Xarelto with no bleeding issues. => Has financial concerns about staying on Xarelto.-Recommend referral to Social Work./Pharm.D. team to determine cost assessment.   ===================================  ASSESSMENT/PLAN   Problem List Items Addressed This Visit       Cardiology Problems   Hypertension associated with type 2 diabetes mellitus (HCC) (Chronic)    Borderline elevated blood pressure today, but okay for her age.  Would not want to be more aggressive to avoid hypotension related issues.      Hyperlipidemia associated with type 2 diabetes mellitus (Debra Campbell) (Chronic)    Labs in February showed LDL of 104.  Acceptable given her advanced age and lack of desire to be overly aggressive.  She is not on a medication Preferred not to be on 1.      Permanent atrial fibrillation (HCC) CHA2DS2Vasc = 5; Xarelto - Primary (Chronic)    Stable, rate controlled.  Asymptomatic.  I suspect that this is contributing some to her elevated pressures and swelling, but would not want to be overly aggressive treating.  She is on Toprol for rate control and now on Xarelto monotherapy for anticoagulation. => Will refer to SW to discuss Cost options.      Relevant Orders   EKG 12-Lead (Completed)   Aortic calcification (HCC) (Chronic)    Cardiovascular risk modification.  Howeve she is intolerant to statins and does not want take something else.  As such, we will simply continue blood pressure management.      Pulmonary hypertension, unspecified (HCC) (Chronic)    Moderate to severe pulm hypertension noted on echo and she does have some exertional dyspnea but not very symptomatic.  Probably some diastolic  dysfunction component in the setting of A-fib and obesity hypoventilation syndrome.  We will continue furosemide as well as blood pressure control with Toprol.  Would be reluctant to consider invasive pulmonary pressure evaluation.      Mild aortic stenosis by prior echocardiogram (Chronic)    Mild AS on previous echo.  Murmur is Campbell stable.  Unless murmur worsens or symptoms worsen, would not recheck echo.        Other   Morbid obesity (Debra Campbell) (Chronic)    No longer morbidly obese.  Her weight is down to just over 200 pounds with a BMI of 34.  Needs continued stay active and exercise.  She seems to be more sedentary.      Peripheral edema (Chronic)    Probably related to venous insufficiency.  She is on furosemide but no associated PND or orthopnea to make it likely to be CHF related. Recommend support stockings, but she can put them on.  Discussed possibility of using zipper stockings.       ===================================  HPI:    Debra Campbell is a morbidly obese 88 y.o. female with a PMH notable for PAF (on Xarelto), HTN-HLD and DM-2 along with  with aortic plaque and mild aortic stenosis who presents today for annual follow-up at the request of. Debra Campbell, Debra Campbell, *.  She is accompanied by a gentleman who I believe is her niece.  Debra Campbell was last seen on January 30, 2021: Doing relatively well.  Stable.  Notes some heart fluttering if  she gets upset or exerts herself.  Not very active-uses a cane in house and walker from her long distance.  Limited by back and hip pain as well as knee pain.  Campbell sedentary.  Recent Hospitalizations:  11/28/2021: ER visit.  Nephrolithiasis/ureterolithiasis-presented with flank pain with hematuria.  Noted left ureteral stone with hydronephrosis--treated with analgesics and narcotics; discharged on Flomax and referred to urology.  Reviewed  CV studies:    The following studies were reviewed today: (if available, images/films  reviewed: From Epic Chart or Care Everywhere) None:  Interval History:   Debra Campbell returns here today for delayed annual follow-up stating that she is really doing Campbell well.  She says every once a while she will feel an unusual fluttering sensation in her chest but not necessarily really fast.  Just an irregularity.  She usually notes it when she is lying down to go to bed.  No associated symptoms such as dyspnea or chest tightness or pressure. She denies any PND, orthopnea or significant edema.  Just a little bit of right greater than left ankle/foot swelling.  She does not like to wear support stockings because they are not comfortable.  She has difficulty putting them on. Maybe gets a little dizzy if she bends over and stands up quickly, but denies any real orthostatic symptoms otherwise.  She denies any TIA or amaurosis fugax symptoms.  No syncope or near syncope.  Exertional dyspnea mostly because she is deconditioned.  No chest pain or pressure with rest or exertion.  No claudication   REVIEWED OF SYSTEMS   Pertinent positive symptoms: (Otherwise negative) Does not have a lot of energy.  Still deconditioned, does not really exercise much.  Has an unsteady gait along with back and joint (hip/knee) pain that combine  with to limit her walking.  She therefore has exertional dyspnea, but no chest pain or pressure.. Mild positional dizziness, legs feel weak, and mild memory loss.  I have reviewed and (if needed) personally updated the patient's problem list, medications, allergies, past medical and surgical history, social and family history.   PAST MEDICAL HISTORY   Past Medical History:  Diagnosis Date   Aortic calcification ([place]) 2013   Atherosclerotic calcifications of the abdominal aorta and branch noted on CT Abd/Pelvis   Arthritis    legs, lower back, hands   Cancer ([place])    endometrial   Diabetes mellitus    Ectopic pregnancy    Eczema    Headache(784.0)    hx of    History of kidney stones    Hyperlipidemia    no meds   Hypertension    Hypothyroidism    Persistent atrial fibrillation ([place]) 09/28/2016   Relatively new Dx: Asymptomatic.  Rate controlled without medication.   SVD (spontaneous vaginal delivery)    x 3   Thyroid disease     PAST SURGICAL HISTORY   Past Surgical History:  Procedure Laterality Date   ABDOMINAL HYSTERECTOMY Bilateral 06/26/2013   Procedure: TOTAL ABDOMINAL HYSTERECTOMY WITH BILATERAL SALPINGO OOPHERECTOMY WITH PELVIC LYMPHADNECTOMY;  Surgeon: Alvino Chapel, MD;  Location: [facility];  Service: Gynecology;  Laterality: Bilateral;   BACK SURGERY  06/26/2013   ECTOPIC PREGNANCY SURGERY     HERNIA REPAIR     HYSTEROSCOPY WITH D & C N/A 04/26/2013   Procedure: DILATATION AND CURETTAGE /HYSTEROSCOPY;  Surgeon: Woodroe Mode, MD;  Location: [facility];  Service: Gynecology;  Laterality: N/A;   JOINT REPLACEMENT     LAPAROTOMY  for ectopic pregnancy   LAPAROTOMY N/A 06/26/2013   Procedure: EXPLORATORY LAPAROTOMY;  Surgeon: Alvino Chapel, MD;  Location: [facility];  Service: Gynecology;  Laterality: N/A;   lt knee arthroscopy     rt total hip replacement     TONSILLECTOMY     TOTAL KNEE ARTHROPLASTY Left 12/22/2016   Procedure: LEFT TOTAL KNEE ARTHROPLASTY;  Surgeon: Latanya Maudlin, MD;  Location: [facility];  Service: Orthopedics;  Laterality: Left;   TRANSTHORACIC ECHOCARDIOGRAM  09/2018   EF 60-65%. Mild AS.  Mod-Severe PAH 60 mmHg   WISDOM TOOTH EXTRACTION      Immunization History  Administered Date(s) Administered   Fluad Quad(high Dose 65+) 08/29/2019, 10/14/2020, 10/16/2021   Influenza, High Dose Seasonal PF 09/28/2016, 10/06/2017, 10/17/2018   Influenza,inj,Quad PF,6+ Mos 09/26/2013, 09/09/2014, 10/13/2015   Moderna Sars-Covid-2 Vaccination 12/22/2019, 01/19/2020, 11/10/2020   Pneumococcal Conjugate-13 05/01/2015   Pneumococcal Polysaccharide-23 03/29/2002   Td 10/29/2008, 08/31/2011   Tdap  08/31/2011   Zoster Recombinat (Shingrix) 10/30/2018, 01/15/2021    MEDICATIONS/ALLERGIES   Current Meds  Medication Sig   Accu-Chek Softclix Lancets lancets CHECK BLOOD SUGAR ONCE DAILY DX E11.42   acetaminophen (TYLENOL) 500 MG tablet Take 1 tablet (500 mg total) by mouth every 8 (eight) hours as needed.   furosemide (LASIX) 40 MG tablet Take 2 tablets (80 mg total) by mouth 2 (two) times daily. MAY TAKE AN ADDITIONAL 40 MG IF WEIGHT GAIN OF 3 LBS OR MORE OVER NIGHT   gabapentin (NEURONTIN) 300 MG capsule Take 1 capsule (300 mg total) by mouth 2 (two) times daily.   glucose blood (ACCU-CHEK GUIDE) test strip CHECK BLOOD SUGAR ONCE DAILY DX E11.42   levothyroxine (SYNTHROID) 125 MCG tablet Take 1 tablet (125 mcg total) by mouth daily.   metFORMIN (GLUCOPHAGE) 500 MG tablet Take 1 tablet (500 mg total) by mouth 2 (two) times daily with a meal.   metoprolol succinate (TOPROL-XL) 25 MG 24 hr tablet Take 1 tablet (25 mg total) by mouth daily.   nystatin cream (MYCOSTATIN) Apply 1 application topically 2 (two) times daily.   ondansetron (ZOFRAN-ODT) 8 MG disintegrating tablet Take 1 tablet (8 mg total) by mouth every 8 (eight) hours as needed.   tamsulosin (FLOMAX) 0.4 MG CAPS capsule Take 1 capsule (0.4 mg total) by mouth daily after supper.   [DISCONTINUED] rivaroxaban (XARELTO) 20 MG TABS tablet TAKE 1 TABLET EVERY DAY WITH BREAKFAST => No longer taking    Allergies  Allergen Reactions   Ace Inhibitors Cough   Iohexol      Desc: HIVES 40 YEARS AGO    Statins     myalgia   Penicillins Swelling and Rash    Has patient had a PCN reaction causing immediate rash, facial/tongue/throat swelling, SOB or lightheadedness with hypotension: Yes Has patient had a PCN reaction causing severe rash involving mucus membranes or skin necrosis: No Has patient had a PCN reaction that required hospitalization Yes Has patient had a PCN reaction occurring within the last 10 years: No If all of the above  answers are "NO", then may proceed with Cephalosporin use.    Sulfa Antibiotics Rash    SOCIAL HISTORY/FAMILY HISTORY   Reviewed in Epic:  Pertinent findings:  Social History   Tobacco Use   Smoking status: Never   Smokeless tobacco: Never  Vaping Use   Vaping Use: Never used  Substance Use Topics   Alcohol use: No   Drug use: No   Social History   Social History  Narrative   Not on file    OBJCTIVE -PE, EKG, labs   Wt Readings from Last 3 Encounters:  04/15/22 204 lb (92.5 kg)  04/12/22 205 lb 6.4 oz (93.2 kg)  01/15/22 203 lb (92.1 kg)    Physical Exam: BP 140/78   Pulse 83   Ht '5\' 5"'$  (1.651 m)   Wt 205 lb 6.4 oz (93.2 kg)   SpO2 96%   BMI 34.18 kg/m  Physical Exam Constitutional:      General: She is not in acute distress.    Appearance: Normal appearance. She is obese. She is not ill-appearing or toxic-appearing.  HENT:     Head: Normocephalic and atraumatic.  Neck:     Vascular: No carotid bruit, hepatojugular reflux or JVD (JVP estimated 7 cm water.).  Cardiovascular:     Rate and Rhythm: Normal rate. Rhythm irregularly irregular. Occasional Extrasystoles (Distant) are present.    Chest Wall: PMI is displaced (Unable to palpate).     Pulses: Intact distal pulses.     Heart sounds: S1 normal and S2 normal. Heart sounds are distant. Murmur (1/6 SEM) heard.     No friction rub. No gallop.  Pulmonary:     Effort: Pulmonary effort is normal. No respiratory distress.     Breath sounds: Normal breath sounds. No wheezing, rhonchi or rales.  Chest:     Chest wall: No tenderness.  Musculoskeletal:        General: Swelling (Stable 1-2+ bilateral LE edema.) present.     Cervical back: Normal range of motion and neck supple.  Skin:    General: Skin is warm and dry.  Neurological:     General: No focal deficit present.     Mental Status: She is alert and oriented to person, place, and time. Mental status is at baseline.     Gait: Gait normal.  Psychiatric:         Mood and Affect: Mood normal.        Behavior: Behavior normal.        Thought Content: Thought content normal.        Judgment: Judgment normal.     Adult ECG Report  Rate: 83;  Rhythm: atrial fibrillation and controlled rate.  Otherwise normal axis, intervals and durations. ;   Narrative Interpretation: Stable  Recent Labs: Reviewed Lab Results  Component Value Date   CHOL 195 04/15/2022   HDL 56 04/15/2022   LDLCALC 113 (H) 04/15/2022   TRIG 146 04/15/2022   CHOLHDL 3.5 04/15/2022   Lab Results  Component Value Date   CREATININE 1.07 (H) 04/15/2022   BUN 19 04/15/2022   NA 143 04/15/2022   K 4.6 04/15/2022   CL 107 (H) 04/15/2022   CO2 24 04/15/2022      Latest Ref Rng & Units 04/15/2022   10:46 AM 01/15/2022   10:35 AM 11/28/2021   10:44 PM  CBC  WBC 3.4 - 10.8 x10E3/uL 4.7  4.5  6.6   Hemoglobin 11.1 - 15.9 g/dL 13.2  12.9  12.3   Hematocrit 34.0 - 46.6 % 38.6  38.0  37.2   Platelets 150 - 450 x10E3/uL 163  160  148     Lab Results  Component Value Date   HGBA1C 6.2 (H) 04/15/2022   Lab Results  Component Value Date   TSH 1.150 01/15/2022    ==================================================  COVID-19 Education: The signs and symptoms of COVID-19 were discussed with the patient and how to seek care  for testing (follow up with PCP or arrange E-visit).    I spent a total of 27 minutes with the patient spent in direct patient consultation.  Additional time spent with chart review  / charting (studies, outside notes, etc): 14 min Total Time: 41 min  Current medicines are reviewed at length with the patient today.  (+/- concerns) N/A  This visit occurred during the SARS-CoV-2 public health emergency.  Safety protocols were in place, including screening questions prior to the visit, additional usage of staff PPE, and extensive cleaning of exam room while observing appropriate contact time as indicated for disinfecting solutions.  Notice: This  dictation was prepared with Dragon dictation along with smart phrase technology. Any transcriptional errors that result from this process are unintentional and may not be corrected upon review.  Studies Ordered:   Orders Placed This Encounter  Procedures   EKG 12-Lead   No orders of the defined types were placed in this encounter.   Patient Instructions / Medication Changes & Studies & Tests Ordered   Patient Instructions  Medication Instructions:  No changes   *If you need a refill on your cardiac medications before your next appointment, please call your pharmacy*   Lab Work: Not needed    Testing/Procedures:  Not needed  Follow-Up: At [facility], you and your health needs are our priority.  As part of our continuing mission to provide you with exceptional heart care, we have created designated Provider Care Teams.  These Care Teams include your primary Cardiologist (physician) and Advanced Practice Providers (APPs -  Physician Assistants and Nurse Practitioners) who all work together to provide you with the care you need, when you need it.     Your next appointment:   12 month(s)  The format for your next appointment:   In Person  Provider:   Glenetta Hew, MD         Debra Campbell, M.D., M.S. Interventional Cardiologist   Pager # [phone] Phone # [phone] [address] [place], [place] [address]   Thank you for choosing Heartcare at [facility]!!

## 2022-04-15 ENCOUNTER — Encounter: Payer: Self-pay | Admitting: Nurse Practitioner

## 2022-04-15 ENCOUNTER — Ambulatory Visit (INDEPENDENT_AMBULATORY_CARE_PROVIDER_SITE_OTHER): Payer: Medicare Other | Admitting: Nurse Practitioner

## 2022-04-15 VITALS — BP 143/82 | HR 61 | Temp 98.3°F | Resp 20 | Ht 65.0 in | Wt 204.0 lb

## 2022-04-15 DIAGNOSIS — E1159 Type 2 diabetes mellitus with other circulatory complications: Secondary | ICD-10-CM | POA: Diagnosis not present

## 2022-04-15 DIAGNOSIS — I700 Atherosclerosis of aorta: Secondary | ICD-10-CM | POA: Diagnosis not present

## 2022-04-15 DIAGNOSIS — I272 Pulmonary hypertension, unspecified: Secondary | ICD-10-CM

## 2022-04-15 DIAGNOSIS — E1169 Type 2 diabetes mellitus with other specified complication: Secondary | ICD-10-CM

## 2022-04-15 DIAGNOSIS — E785 Hyperlipidemia, unspecified: Secondary | ICD-10-CM

## 2022-04-15 DIAGNOSIS — I350 Nonrheumatic aortic (valve) stenosis: Secondary | ICD-10-CM | POA: Diagnosis not present

## 2022-04-15 DIAGNOSIS — M8588 Other specified disorders of bone density and structure, other site: Secondary | ICD-10-CM | POA: Diagnosis not present

## 2022-04-15 DIAGNOSIS — R609 Edema, unspecified: Secondary | ICD-10-CM

## 2022-04-15 DIAGNOSIS — E1142 Type 2 diabetes mellitus with diabetic polyneuropathy: Secondary | ICD-10-CM | POA: Diagnosis not present

## 2022-04-15 DIAGNOSIS — E034 Atrophy of thyroid (acquired): Secondary | ICD-10-CM | POA: Diagnosis not present

## 2022-04-15 DIAGNOSIS — M159 Polyosteoarthritis, unspecified: Secondary | ICD-10-CM | POA: Diagnosis not present

## 2022-04-15 DIAGNOSIS — I152 Hypertension secondary to endocrine disorders: Secondary | ICD-10-CM

## 2022-04-15 LAB — CBC WITH DIFFERENTIAL/PLATELET
Basophils Absolute: 0.1 10*3/uL (ref 0.0–0.2)
Basos: 1 %
EOS (ABSOLUTE): 0.2 10*3/uL (ref 0.0–0.4)
Eos: 5 %
Hematocrit: 38.6 % (ref 34.0–46.6)
Hemoglobin: 13.2 g/dL (ref 11.1–15.9)
Immature Grans (Abs): 0 10*3/uL (ref 0.0–0.1)
Immature Granulocytes: 0 %
Lymphocytes Absolute: 1.9 10*3/uL (ref 0.7–3.1)
Lymphs: 39 %
MCH: 32.1 pg (ref 26.6–33.0)
MCHC: 34.2 g/dL (ref 31.5–35.7)
MCV: 94 fL (ref 79–97)
Monocytes Absolute: 0.4 10*3/uL (ref 0.1–0.9)
Monocytes: 9 %
Neutrophils Absolute: 2.1 10*3/uL (ref 1.4–7.0)
Neutrophils: 46 %
Platelets: 163 10*3/uL (ref 150–450)
RBC: 4.11 x10E6/uL (ref 3.77–5.28)
RDW: 12.8 % (ref 11.7–15.4)
WBC: 4.7 10*3/uL (ref 3.4–10.8)

## 2022-04-15 LAB — LIPID PANEL
Chol/HDL Ratio: 3.5 ratio (ref 0.0–4.4)
Cholesterol, Total: 195 mg/dL (ref 100–199)
HDL: 56 mg/dL (ref >39)
LDL Chol Calc (NIH): 113 mg/dL — ABNORMAL HIGH (ref 0–99)
Triglycerides: 146 mg/dL (ref 0–149)
VLDL Cholesterol Cal: 26 mg/dL (ref 5–40)

## 2022-04-15 LAB — CMP14+EGFR
ALT: 12 IU/L (ref 0–32)
AST: 18 IU/L (ref 0–40)
Albumin/Globulin Ratio: 1.5 (ref 1.2–2.2)
Albumin: 4.2 g/dL (ref 3.6–4.6)
Alkaline Phosphatase: 53 IU/L (ref 44–121)
BUN/Creatinine Ratio: 18 (ref 12–28)
BUN: 19 mg/dL (ref 8–27)
Bilirubin Total: 0.9 mg/dL (ref 0.0–1.2)
CO2: 24 mmol/L (ref 20–29)
Calcium: 9.5 mg/dL (ref 8.7–10.3)
Chloride: 107 mmol/L — ABNORMAL HIGH (ref 96–106)
Creatinine, Ser: 1.07 mg/dL — ABNORMAL HIGH (ref 0.57–1.00)
Globulin, Total: 2.8 g/dL (ref 1.5–4.5)
Glucose: 144 mg/dL — ABNORMAL HIGH (ref 70–99)
Potassium: 4.6 mmol/L (ref 3.5–5.2)
Sodium: 143 mmol/L (ref 134–144)
Total Protein: 7 g/dL (ref 6.0–8.5)
eGFR: 50 mL/min/{1.73_m2} — ABNORMAL LOW (ref >59)

## 2022-04-15 LAB — BAYER DCA HB A1C WAIVED: HB A1C (BAYER DCA - WAIVED): 6.2 % — ABNORMAL HIGH (ref 4.8–5.6)

## 2022-04-15 NOTE — Progress Notes (Signed)
Subjective:    Patient ID: Debra Campbell, female    DOB: 07/05/1932, 88 y.o.   MRN: [id number]   Chief Complaint: Medical Management of Chronic Issues    HPI:  MENDI Campbell is a 88 y.o. who identifies as a female who was assigned female at birth.   Social history: Lives with: husband Work history: retired   Scientist, forensic in today for follow up of the following chronic medical issues:  1. Type 2 diabetes mellitus with diabetic polyneuropathy, without long-term current use of insulin (HCC) Fasting blood running around 110-14. No low blood sugars. Lab Results  Component Value Date   HGBA1C 6.4 (H) 01/15/2022     2. Hypertension associated with type 2 diabetes mellitus (Debra Campbell) No c/o chest pain, sob or headache. Doe snot check blood pressure at home very often. BP Readings from Last 3 Encounters:  04/15/22 (!) 143/82  04/12/22 140/78  01/15/22 138/73     3. Hyperlipidemia associated with type 2 diabetes mellitus (Debra Campbell) Does try to wtatch diet but does no dedicated exercises. Lab Results  Component Value Date   CHOL 186 01/15/2022   HDL 59 01/15/2022   LDLCALC 104 (H) 01/15/2022   TRIG 134 01/15/2022   CHOLHDL 3.2 01/15/2022     4. Mild aortic stenosis by prior echocardiogram 5. Pulmonary hypertension, unspecified (Debra Campbell) 6. Aortic calcification (HCC) 7. Atrial fib Saw cardiology last week and she was in atrial fib. She is on eliquis and is doing well. No changes were made to plan of care.  7. Hypothyroidism due to acquired atrophy of thyroid Lab Results  Component Value Date   TSH 1.150 01/15/2022  '  8. Diabetic polyneuropathy associated with type 2 diabetes mellitus (HCC) Has constant burning in bil feet  9. Peripheral edema Has daily edema. She does elevate them at home when sitting  10. Osteopenia of lumbar spine Last dexascan was done 10/13/21. T score was -2.0. she does not do in weight bearing exercise.  11. Primary osteoarthritis involving multiple  joints  12. Morbid obesity (Debra Campbell) No recent weight changes. Wt Readings from Last 3 Encounters:  04/15/22 204 lb (92.5 kg)  04/12/22 205 lb 6.4 oz (93.2 kg)  01/15/22 203 lb (92.1 kg)   BMI Readings from Last 3 Encounters:  04/15/22 33.95 kg/m  04/12/22 34.18 kg/m  01/15/22 34.84 kg/m      New complaints: None today  Allergies  Allergen Reactions   Ace Inhibitors Cough   Iohexol      Desc: HIVES 40 YEARS AGO    Statins     myalgia   Penicillins Swelling and Rash    Has patient had a PCN reaction causing immediate rash, facial/tongue/throat swelling, SOB or lightheadedness with hypotension: Yes Has patient had a PCN reaction causing severe rash involving mucus membranes or skin necrosis: No Has patient had a PCN reaction that required hospitalization Yes Has patient had a PCN reaction occurring within the last 10 years: No If all of the above answers are "NO", then may proceed with Cephalosporin use.    Sulfa Antibiotics Rash   Outpatient Encounter Medications as of 04/15/2022  Medication Sig   Accu-Chek Softclix Lancets lancets CHECK BLOOD SUGAR ONCE DAILY DX E11.42   acetaminophen (TYLENOL) 500 MG tablet Take 1 tablet (500 mg total) by mouth every 8 (eight) hours as needed.   furosemide (LASIX) 40 MG tablet Take 2 tablets (80 mg total) by mouth 2 (two) times daily. MAY TAKE AN ADDITIONAL 40 MG  IF WEIGHT GAIN OF 3 LBS OR MORE OVER NIGHT   gabapentin (NEURONTIN) 300 MG capsule Take 1 capsule (300 mg total) by mouth 2 (two) times daily.   glucose blood (ACCU-CHEK GUIDE) test strip CHECK BLOOD SUGAR ONCE DAILY DX E11.42   levothyroxine (SYNTHROID) 125 MCG tablet Take 1 tablet (125 mcg total) by mouth daily.   metFORMIN (GLUCOPHAGE) 500 MG tablet Take 1 tablet (500 mg total) by mouth 2 (two) times daily with a meal.   metoprolol succinate (TOPROL-XL) 25 MG 24 hr tablet Take 1 tablet (25 mg total) by mouth daily.   nystatin cream (MYCOSTATIN) Apply 1 application topically 2  (two) times daily.   ondansetron (ZOFRAN-ODT) 8 MG disintegrating tablet Take 1 tablet (8 mg total) by mouth every 8 (eight) hours as needed.   rivaroxaban (XARELTO) 20 MG TABS tablet TAKE 1 TABLET EVERY DAY WITH BREAKFAST   tamsulosin (FLOMAX) 0.4 MG CAPS capsule Take 1 capsule (0.4 mg total) by mouth daily after supper.   [DISCONTINUED] blood glucose meter kit and supplies Dispense based on patient and insurance preference. Use up to four times daily as directed. (FOR ICD-10 E10.9, E11.9).   No facility-administered encounter medications on file as of 04/15/2022.    Past Surgical History:  Procedure Laterality Date   ABDOMINAL HYSTERECTOMY Bilateral 06/26/2013   Procedure: TOTAL ABDOMINAL HYSTERECTOMY WITH BILATERAL SALPINGO OOPHERECTOMY WITH PELVIC LYMPHADNECTOMY;  Surgeon: Alvino Chapel, MD;  Location: [facility];  Service: Gynecology;  Laterality: Bilateral;   BACK SURGERY  06/26/2013   ECTOPIC PREGNANCY SURGERY     HERNIA REPAIR     HYSTEROSCOPY WITH D & C N/A 04/26/2013   Procedure: DILATATION AND CURETTAGE /HYSTEROSCOPY;  Surgeon: Woodroe Mode, MD;  Location: [facility];  Service: Gynecology;  Laterality: N/A;   JOINT REPLACEMENT     LAPAROTOMY     for ectopic pregnancy   LAPAROTOMY N/A 06/26/2013   Procedure: EXPLORATORY LAPAROTOMY;  Surgeon: Alvino Chapel, MD;  [facility];  Service: Gynecology;  Laterality: N/A;   lt knee arthroscopy     rt total hip replacement     TONSILLECTOMY     TOTAL KNEE ARTHROPLASTY Left 12/22/2016   Procedure: LEFT TOTAL KNEE ARTHROPLASTY;  Surgeon: Latanya Maudlin, MD;  [facility];  Service: Orthopedics;  Laterality: Left;   TRANSTHORACIC ECHOCARDIOGRAM  09/2018   EF 60-65%. Mild AS.  Mod-Severe PAH 60 mmHg   WISDOM TOOTH EXTRACTION      Family History  Problem Relation Age of Onset   Heart disease Father    Thyroid disease Father    Heart attack Father    Heart disease Sister        open heart surgery   Cancer Sister         breast   Neuropathy Sister        secondary to cancer treatment   Breast cancer Sister    Suicidality Brother 47   Arthritis Mother    Cancer Daughter    Breast cancer Daughter       Controlled substance contract: n/a     Review of Systems     Objective:   Physical Exam Vitals and nursing note reviewed.  Constitutional:      General: She is not in acute distress.    Appearance: Normal appearance. She is well-developed.  HENT:     Head: Normocephalic.     Right Ear: Tympanic membrane normal.     Left Ear: Tympanic membrane normal.  Nose: Nose normal.     Mouth/Throat:     Mouth: Mucous membranes are moist.  Eyes:     Pupils: Pupils are equal, round, and reactive to light.  Neck:     Vascular: No carotid bruit or JVD.  Cardiovascular:     Rate and Rhythm: Normal rate and regular rhythm.     Heart sounds: Murmur (2/6) heard.  Pulmonary:     Effort: Pulmonary effort is normal. No respiratory distress.     Breath sounds: Normal breath sounds. No wheezing or rales.  Chest:     Chest wall: No tenderness.  Abdominal:     General: Bowel sounds are normal. There is no distension or abdominal bruit.     Palpations: Abdomen is soft. There is no hepatomegaly, splenomegaly, mass or pulsatile mass.     Tenderness: There is no abdominal tenderness.  Musculoskeletal:        General: Normal range of motion.     Cervical back: Normal range of motion and neck supple.     Right lower leg: Edema (2+) present.     Left lower leg: Edema (2+) present.  Lymphadenopathy:     Cervical: No cervical adenopathy.  Skin:    General: Skin is warm and dry.  Neurological:     Mental Status: She is alert and oriented to person, place, and time.     Deep Tendon Reflexes: Reflexes are normal and symmetric.  Psychiatric:        Behavior: Behavior normal.        Thought Content: Thought content normal.        Judgment: Judgment normal.    BP (!) 143/82   Pulse 61   Temp 98.3 F  (36.8 C) (Temporal)   Resp 20   Ht _0  (1.651 m)   Wt 204 lb (92.5 kg)   SpO2 95%   BMI 33.95 kg/m   HGBA1c 6.3%     Assessment & Plan:  Debra Campbell comes in today with chief complaint of Medical Management of Chronic Issues   Diagnosis and orders addressed:  1. Type 2 diabetes mellitus with diabetic polyneuropathy, without long-term current use of insulin (HCC) Continue to watch carbs in diet - Bayer DCA Hb A1c Waived  2. Hypertension associated with type 2 diabetes mellitus (HCC) Low sodium diet - CBC with Differential/Platelet - CMP14+EGFR  3. Hyperlipidemia associated with type 2 diabetes mellitus (HCC) Low fat - Lipid panel  4. Mild aortic stenosis by prior echocardiogram 5. Pulmonary hypertension, unspecified ([place]) 6. Aortic calcification (HCC) Keep follow  up with cardiology  7. Hypothyroidism due to acquired atrophy of thyroid Labs pending  8. Diabetic polyneuropathy associated with type 2 diabetes mellitus ([place]) Do not go barefooted  9. Peripheral edema Elevate legs when sitting  10. Osteopenia of lumbar spine Weight bearing exercises  11. Primary osteoarthritis involving multiple joints  12. Morbid obesity ([place]) Discussed diet and exercise for person with BMI >25 Will recheck weight in 3-6 months    Labs pending Health Maintenance reviewed Diet and exercise encouraged  Follow up plan: 3 months   [name] Hassell Done, FNP

## 2022-04-15 NOTE — Patient Instructions (Signed)

## 2022-04-21 ENCOUNTER — Telehealth: Payer: Self-pay | Admitting: Cardiology

## 2022-04-21 DIAGNOSIS — I4821 Permanent atrial fibrillation: Secondary | ICD-10-CM

## 2022-04-21 MED ORDER — RIVAROXABAN 20 MG PO TABS: ORAL_TABLET | ORAL | 0 refills | Status: DC

## 2022-04-21 NOTE — Telephone Encounter (Signed)
Prescription refill request for Xarelto received.  Indication: a fib Last office visit: 04/12/22 Weight: 204 Age: 88 Scr: 1.07 CrCl: 54 mL/min

## 2022-04-21 NOTE — Telephone Encounter (Addendum)
*  STAT* If patient is at the pharmacy, call can be transferred to refill team.   1. Which medications need to be refilled? (please list name of each medication and dose if known) rivaroxaban (XARELTO) 20 MG TABS tablet  2. Which pharmacy/location (including street and city if local pharmacy) is medication to be sent to? [facility] FAX # [phone]  3. Do they need a 30 day or 90 day supply? 90 day

## 2022-05-09 ENCOUNTER — Encounter: Payer: Self-pay | Admitting: Cardiology

## 2022-05-09 NOTE — Assessment & Plan Note (Signed)
No longer morbidly obese.  Her weight is down to just over 200 pounds with a BMI of 34.  Needs continued stay active and exercise.  She seems to be more sedentary.

## 2022-05-09 NOTE — Assessment & Plan Note (Signed)
Stable, rate controlled.  Asymptomatic.  I suspect that this is contributing some to her elevated pressures and swelling, but would not want to be overly aggressive treating.  She is on Toprol for rate control and now on Xarelto monotherapy for anticoagulation. => Will refer to SW to discuss Cost options.

## 2022-05-09 NOTE — Assessment & Plan Note (Signed)
Cardiovascular risk modification.  Howeve she is intolerant to statins and does not want take something else.  As such, we will simply continue blood pressure management.

## 2022-05-09 NOTE — Assessment & Plan Note (Signed)
Labs in February showed LDL of 104.  Acceptable given her advanced age and lack of desire to be overly aggressive.  She is not on a medication Preferred not to be on 1.

## 2022-05-09 NOTE — Assessment & Plan Note (Signed)
Borderline elevated blood pressure today, but okay for her age.  Would not want to be more aggressive to avoid hypotension related issues.

## 2022-05-09 NOTE — Assessment & Plan Note (Signed)
Probably related to venous insufficiency.  She is on furosemide but no associated PND or orthopnea to make it likely to be CHF related. Recommend support stockings, but she can put them on.  Discussed possibility of using zipper stockings.

## 2022-05-09 NOTE — Assessment & Plan Note (Signed)
Mild AS on previous echo.  Murmur is pretty stable.  Unless murmur worsens or symptoms worsen, would not recheck echo.

## 2022-05-09 NOTE — Assessment & Plan Note (Signed)
Moderate to severe pulm hypertension noted on echo and she does have some exertional dyspnea but not very symptomatic.  Probably some diastolic dysfunction component in the setting of A-fib and obesity hypoventilation syndrome.  We will continue furosemide as well as blood pressure control with Toprol.  Would be reluctant to consider invasive pulmonary pressure evaluation.

## 2022-05-28 ENCOUNTER — Ambulatory Visit (INDEPENDENT_AMBULATORY_CARE_PROVIDER_SITE_OTHER): Payer: Medicare Other | Admitting: Nurse Practitioner

## 2022-05-28 ENCOUNTER — Encounter: Payer: Self-pay | Admitting: Nurse Practitioner

## 2022-05-28 VITALS — BP 113/69 | HR 91 | Ht 65.0 in | Wt 200.0 lb

## 2022-05-28 DIAGNOSIS — L03116 Cellulitis of left lower limb: Secondary | ICD-10-CM

## 2022-05-28 MED ORDER — DOXYCYCLINE HYCLATE 100 MG PO TABS: 100.0000 mg | ORAL_TABLET | Freq: Two times a day (BID) | ORAL | 0 refills | Status: DC

## 2022-05-28 NOTE — Progress Notes (Signed)
Acute Office Visit  Subjective:     Patient ID: Debra Campbell, female    DOB: [birth year], 88 y.o.   MRN: [id number]  Chief Complaint  Patient presents with   Fever    Last night - 100 - 101.5   Cellulitis    Fever  This is a new problem. The current episode started yesterday. The problem occurs intermittently. The problem has been gradually improving. The maximum temperature noted was 101 to 101.9 F. Associated symptoms include a rash. Pertinent negatives include no congestion, coughing or headaches. She has tried acetaminophen for the symptoms. The treatment provided mild relief.  Risk factors: no contaminated food, no hx of cancer and no occupational exposure     Debra Campbell is a 88 y.o. female who presents with erythema, pain, and fever.  Location: left lower extremity  Onset: few days ago  Duration: few days and symptoms are worsening  Associated symptoms: edema  Recent treatment: none Functional status affected: no   Allergies: Ace inhibitors, Iohexol, Statins, Penicillins, and Sulfa antibiotics Patient Active Problem List   Diagnosis Date Noted   Mild aortic stenosis by prior echocardiogram 01/30/2020   Pulmonary hypertension, unspecified ([place]) 10/27/2018   Peripheral edema 07/25/2018   Status post total left knee replacement 12/22/2016   S/P TKR (total knee replacement) 12/22/2016   Permanent atrial fibrillation ([place]) CHA2DS2Vasc = 5; Xarelto 09/28/2016   Morbid obesity ([place]) 08/18/2015   Type 2 diabetes mellitus with diabetic polyneuropathy ([place]) 05/01/2015   Endometrial cancer ([place]) 05/08/2013   Aortic calcification ([place]) 11/30/2011   Hypertension associated with type 2 diabetes mellitus ([place]) 03/18/2011   Hypothyroidism 03/18/2011   Osteoarthritis 03/18/2011   Osteopenia 03/18/2011   Hyperlipidemia associated with type 2 diabetes mellitus ([employer]) 03/18/2011   Diabetic neuropathy ([place]) 03/18/2011     Review of Systems  Constitutional:  Positive for fever.  Negative for chills.  HENT: Negative.  Negative for congestion.   Respiratory: Negative.  Negative for cough.   Cardiovascular: Negative.   Genitourinary: Negative.   Musculoskeletal: Negative.   Skin:  Positive for itching and rash.  Neurological: Negative.  Negative for headaches.  All other systems reviewed and are negative.       Objective:    BP 113/69   Pulse 91   Ht '5\' 5"'$  (1.651 m)   Wt 200 lb (90.7 kg)   SpO2 93%   BMI 33.28 kg/m  BP Readings from Last 3 Encounters:  05/28/22 113/69  04/15/22 (!) 143/82  04/12/22 140/78   Wt Readings from Last 3 Encounters:  05/28/22 200 lb (90.7 kg)  04/15/22 204 lb (92.5 kg)  04/12/22 205 lb 6.4 oz (93.2 kg)      Physical Exam Vitals and nursing note reviewed. Exam conducted with a chaperone present (son).  Constitutional:      Appearance: Normal appearance. She is normal weight.  HENT:     Head: Normocephalic.     Right Ear: External ear normal.     Left Ear: External ear normal.     Nose: Nose normal.     Mouth/Throat:     Mouth: Mucous membranes are moist.     Pharynx: Oropharynx is clear.  Eyes:     Conjunctiva/sclera: Conjunctivae normal.  Cardiovascular:     Rate and Rhythm: Normal rate and regular rhythm.     Pulses: Normal pulses.     Heart sounds: Normal heart sounds.  Pulmonary:     Effort: Pulmonary effort is normal.  Breath sounds: Normal breath sounds.  Abdominal:     General: Bowel sounds are normal.  Musculoskeletal:        General: Normal range of motion.     Right lower leg: Edema present.     Left lower leg: Edema present.  Skin:    General: Skin is warm.     Findings: Erythema and rash present.  Neurological:     General: No focal deficit present.     Mental Status: She is oriented to person, place, and time.  Psychiatric:        Mood and Affect: Mood normal.        Behavior: Behavior normal.     No results found for any visits on 05/28/22.      Assessment & Plan:  Patient  presents with left lower extremity cellulitis.  Symptoms present in the past few days to 1 week.  Redness, tenderness, warmth to touch.  Start doxycycline 100 mg tablet by mouth twice daily for 7 days.  Elevate feet.  For pain use Tylenol.  Patient unable to get Rocephin shot in clinic today due to pain allergy.  Education provided with printed handouts given.  Patient should follow-up in 5 days.  If symptoms get worse with infection spreading family knows to take patient to emergency department. Problem List Items Addressed This Visit   None Visit Diagnoses     Cellulitis of left lower extremity    -  Primary   Relevant Medications   doxycycline (VIBRA-TABS) 100 MG tablet       Meds ordered this encounter  Medications   doxycycline (VIBRA-TABS) 100 MG tablet    Sig: Take 1 tablet (100 mg total) by mouth 2 (two) times daily.    Dispense:  14 tablet    Refill:  0    Order Specific Question:   Supervising Provider    Answer:   Claretta Fraise [[id number]]    Return in about 5 days (around 06/02/2022) for cellulities.  Ivy Lynn, NP

## 2022-05-28 NOTE — Patient Instructions (Signed)
Cellulitis, Adult  Cellulitis is a skin infection. The infected area is often warm, red, swollen, and sore. It occurs most often in the arms and lower legs. It is very important to get treated for this condition. What are the causes? This condition is caused by bacteria. The bacteria enter through a break in the skin, such as a cut, burn, insect bite, open sore, or crack. What increases the risk? This condition is more likely to occur in people who: Have a weak body defense system (immune system). Have open cuts, burns, bites, or scrapes on the skin. Are older than 86 years of age. Have a blood sugar problem (diabetes). Have a long-lasting (chronic) liver disease (cirrhosis) or kidney disease. Are very overweight (obese). Have a skin problem, such as: Itchy rash (eczema). Slow movement of blood in the veins (venous stasis). Fluid buildup below the skin (edema). Have been treated with high-energy rays (radiation). Use IV drugs. What are the signs or symptoms? Symptoms of this condition include: Skin that is: Red. Streaking. Spotting. Swollen. Sore or painful when you touch it. Warm. A fever. Chills. Blisters. How is this diagnosed? This condition is diagnosed based on: Medical history. Physical exam. Blood tests. Imaging tests. How is this treated? Treatment for this condition may include: Medicines to treat infections or allergies. Home care, such as: Rest. Placing cold or warm cloths (compresses) on the skin. Hospital care, if the condition is very bad. Follow these instructions at home: Medicines Take over-the-counter and prescription medicines only as told by your doctor. If you were prescribed an antibiotic medicine, take it as told by your doctor. Do not stop taking it even if you start to feel better. General instructions  Drink enough fluid to keep your pee (urine) pale yellow. Do not touch or rub the infected area. Raise (elevate) the infected area above  the level of your heart while you are sitting or lying down. Place cold or warm cloths on the area as told by your doctor. Keep all follow-up visits as told by your doctor. This is important. Contact a doctor if: You have a fever. You do not start to get better after 1-2 days of treatment. Your bone or joint under the infected area starts to hurt after the skin has healed. Your infection comes back. This can happen in the same area or another area. You have a swollen bump in the area. You have new symptoms. You feel ill and have muscle aches and pains. Get help right away if: Your symptoms get worse. You feel very sleepy. You throw up (vomit) or have watery poop (diarrhea) for a long time. You see red streaks coming from the area. Your red area gets larger. Your red area turns dark in color. These symptoms may represent a serious problem that is an emergency. Do not wait to see if the symptoms will go away. Get medical help right away. Call your local emergency services (911 in the [place] Do not drive yourself to the hospital. Summary Cellulitis is a skin infection. The area is often warm, red, swollen, and sore. This condition is treated with medicines, rest, and cold and warm cloths. Take all medicines only as told by your doctor. Tell your doctor if symptoms do not start to get better after 1-2 days of treatment. This information is not intended to replace advice given to you by your health care provider. Make sure you discuss any questions you have with your health care provider. Document Revised: 08/27/2021 Document   Reviewed: 08/27/2021 Elsevier Patient Education  2023 Elsevier Inc.  

## 2022-06-03 ENCOUNTER — Ambulatory Visit (INDEPENDENT_AMBULATORY_CARE_PROVIDER_SITE_OTHER): Payer: Medicare Other | Admitting: Family Medicine

## 2022-06-03 ENCOUNTER — Encounter: Payer: Self-pay | Admitting: Family Medicine

## 2022-06-03 VITALS — BP 103/64 | HR 84 | Temp 97.8°F | Ht 65.0 in | Wt 206.4 lb

## 2022-06-03 DIAGNOSIS — R600 Localized edema: Secondary | ICD-10-CM

## 2022-06-03 DIAGNOSIS — R609 Edema, unspecified: Secondary | ICD-10-CM | POA: Diagnosis not present

## 2022-06-03 DIAGNOSIS — I83029 Varicose veins of left lower extremity with ulcer of unspecified site: Secondary | ICD-10-CM | POA: Diagnosis not present

## 2022-06-03 DIAGNOSIS — L97929 Non-pressure chronic ulcer of unspecified part of left lower leg with unspecified severity: Secondary | ICD-10-CM | POA: Diagnosis not present

## 2022-06-03 DIAGNOSIS — L03116 Cellulitis of left lower limb: Secondary | ICD-10-CM

## 2022-06-03 DIAGNOSIS — I83892 Varicose veins of left lower extremities with other complications: Secondary | ICD-10-CM

## 2022-06-03 NOTE — Progress Notes (Signed)
Established Patient Office Visit  Subjective   Patient ID: QUENESHA DOUGLASS, female    DOB: January 09, 1932  Age: 88 y.o. MRN: [id number]  Chief Complaint  Patient presents with   Cellulitis    HPI Here with niece. Jacqlyn Larsen presents for cellulitis follow up. She was seen on 05/28/22 for a fever. She also had increased edema on her lower legs and a erythematous warm rash on her LLE. She was diagnosed with cellulitis and started on doxycyline. She reports improvement in warmth and erythema. Both of her lower legs remain swollen. They are both weeping now. She has a small shallow ulcer on her LLE. She has been taking lasix 40 mg daily. She reports that she was not weight at her last visit. She has been afebrile. Denies chest pain or increased shortness of breath. Reports a weight gain of 2 lbs.   Past Medical History:  Diagnosis Date   Aortic calcification ([place]) 2013   Atherosclerotic calcifications of the abdominal aorta and branch noted on CT Abd/Pelvis   Arthritis    legs, lower back, hands   Cancer ([place])    endometrial   Diabetes mellitus    Ectopic pregnancy    Eczema    Headache(784.0)    hx of   History of kidney stones    Hyperlipidemia    no meds   Hypertension    Hypothyroidism    Persistent atrial fibrillation ([place]) 09/28/2016   Relatively new Dx: Asymptomatic.  Rate controlled without medication.   SVD (spontaneous vaginal delivery)    x 3   Thyroid disease     ROS As per HPI.    Objective:     BP 103/64   Pulse 84   Temp 97.8 F (36.6 C) (Temporal)   Ht '5\' 5"'$  (1.651 m)   Wt 206 lb 6 oz (93.6 kg)   SpO2 96%   BMI 34.34 kg/m  Wt Readings from Last 3 Encounters:  06/03/22 206 lb 6 oz (93.6 kg)  05/28/22 200 lb (90.7 kg)  04/15/22 204 lb (92.5 kg)      Physical Exam Vitals and nursing note reviewed.  Constitutional:      General: She is not in acute distress.    Appearance: She is not ill-appearing, toxic-appearing or diaphoretic.  Cardiovascular:      Rate and Rhythm: Normal rate and regular rhythm.     Heart sounds: Murmur heard.     No friction rub. No gallop.  Pulmonary:     Effort: Pulmonary effort is normal. No respiratory distress.     Breath sounds: Normal breath sounds. No rhonchi or rales.  Musculoskeletal:     Right lower leg: 3+ Pitting Edema present.     Left lower leg: 3+ Pitting Edema present.  Skin:    Comments: BLEs are edematous and weeping clear fluid. She has a small shallow ulcer on the anterior LLE. No warmth. Mild erythema.   Neurological:     Mental Status: She is alert.      No results found for any visits on 06/03/22.    The ASCVD Risk score (Arnett DK, et al., 2019) failed to calculate for the following reasons:   The 2019 ASCVD risk score is only valid for ages 15 to 81    Assessment & Plan:   Tiaria was seen today for cellulitis.  Diagnoses and all orders for this visit:  Venous stasis ulcer of left lower leg with edema of left lower leg (HCC) Peripheral edema  Bilateral unna boots applied today. Elevate legs. Take 80 mg lasix daily until follow up appt.   Cellulitis of left lower extremity Improving. Complete doxycyline as prescribed.    Return in about 4 days (around 06/07/2022) for unna boot.   The patient indicates understanding of these issues and agrees with the plan.  Gwenlyn Perking, FNP

## 2022-06-03 NOTE — Patient Instructions (Signed)
[facility] An [employer] is a type of bandage (dressing) for the foot and leg. The dressing is a gauze wrap that is soaked with a type of medicine called zinc oxide. The gauze may also include other lotions and medicines that help in wound healing, such as calamine. An Unna boot may be used to treat: Open sores (ulcers) on the foot, heel, or leg. Swelling from disorders that affect the veins or lymphatic system (lymphedema). Skin conditions such as chronic inflammation caused by poor blood flow (stasis dermatitis). The dressing is applied by a health care provider. The gauze is wrapped around your lower extremity in several layers, usually starting at the toes and going upward to the knee. A dry outer wrap goes over the medicated wrap for support and compression.  Before applying the [employer], your health care provider will clean your leg and foot and may apply an antibiotic ointment. You may be asked to raise (elevate) your leg for a while to reduce swelling before the boot is applied. The boot will dry and harden after it is applied. The boot may need to be changed or replaced about twice a week. Follow these instructions at home: [place] as told by your health care provider. You may need to wear a slipper or shoe over the boot that is one or two sizes larger than normal. Check the skin around the boot every day. Tell your health care provider about any concerns. Do not stick anything inside the boot to scratch your skin. Doing that increases your risk of infection. Keep your [employer] clean and dry. Check every day for signs of infection. Check for: Redness, swelling, or pain in your foot or toes. Fluid or blood coming from the boot. Pus or a bad smell coming from the boot. Remove the boot and call your health care provider if you have signs of poor blood flow, such as: Your toes tingle or become numb. Your toes turn cold or turn blue or pale. Your toes are more  swollen or painful. You are unable to move your toes. Activity You may walk with the boot once it has dried. Ask your health care provider how much walking is safe for you. Avoid sitting for a long time without moving. Get up to take short walks as told by your health care provider. This is important to improve blood flow. Bathing Do not take baths, swim, or use a hot tub until your health care provider approves. Ask your health care provider if you may take showers. If your health care provider approves a bath or a shower, do not let the Unna boot get wet. If you take a shower, cover the boot with a watertight covering. If you take a bath, keep your leg with the boot out of the tub. General instructions Keep your leg elevated above the level of your heart while you are sitting or lying down. This will decrease swelling. Do not sit with your knee bent for long periods of time. Take over-the-counter and prescription medicines only as told by your health care provider. Do not use any products that contain nicotine or tobacco, such as cigarettes, e-cigarettes, and chewing tobacco. These can delay healing. If you need help quitting, ask your health care provider. Keep all follow-up visits as told by your health care provider. This is important. Contact a health care provider if: Your skin feels itchy inside the boot. You have a burning sensation, a  rash, or itchy, red, swollen areas of skin (hives) in the boot area. You have a fever or chills. You have any signs of infection, such as: New redness, swelling, or pain. More fluid or blood coming from the boot. Pus or a bad smell coming from the boot. You have increased numbness or pain in your foot or toes. You have any changes in skin color on your foot or toes, such as the skin turning blue or pale or developing patchy areas with spots. Your boot has been damaged or feels like it is no longer fitting properly. Summary An [place] boot is a type of  bandage (dressing) system for the foot and leg. The dressing is a gauze wrap that is soaked with a type of medicine (zinc oxide) to treat foot, heel, or leg ulcers, swelling from disorders that affect the veins or lymphatic system (lymphedema), and skin conditions caused by poor blood flow (stasis dermatitis). This dressing is applied by a health care provider. After it is applied, the boot will dry and harden. The boot may need to be changed or replaced about twice a week. Let your health care provider know if you have any signs of poor blood flow or infection. This information is not intended to replace advice given to you by your health care provider. Make sure you discuss any questions you have with your health care provider. Document Revised: 09/10/2021 Document Reviewed: 09/10/2021 Elsevier Patient Education  [place].

## 2022-06-07 ENCOUNTER — Encounter: Payer: Self-pay | Admitting: Family Medicine

## 2022-06-07 ENCOUNTER — Ambulatory Visit (INDEPENDENT_AMBULATORY_CARE_PROVIDER_SITE_OTHER): Payer: Medicare Other | Admitting: Family Medicine

## 2022-06-07 VITALS — BP 116/63 | HR 72 | Temp 97.7°F | Ht 65.0 in | Wt 203.4 lb

## 2022-06-07 DIAGNOSIS — R609 Edema, unspecified: Secondary | ICD-10-CM | POA: Diagnosis not present

## 2022-06-07 DIAGNOSIS — I83029 Varicose veins of left lower extremity with ulcer of unspecified site: Secondary | ICD-10-CM | POA: Diagnosis not present

## 2022-06-07 DIAGNOSIS — L97929 Non-pressure chronic ulcer of unspecified part of left lower leg with unspecified severity: Secondary | ICD-10-CM | POA: Diagnosis not present

## 2022-06-07 DIAGNOSIS — L03116 Cellulitis of left lower limb: Secondary | ICD-10-CM

## 2022-06-07 DIAGNOSIS — R600 Localized edema: Secondary | ICD-10-CM

## 2022-06-07 DIAGNOSIS — I83892 Varicose veins of left lower extremities with other complications: Secondary | ICD-10-CM | POA: Diagnosis not present

## 2022-06-07 MED ORDER — DOXYCYCLINE HYCLATE 100 MG PO TABS: 100.0000 mg | ORAL_TABLET | Freq: Two times a day (BID) | ORAL | 0 refills | Status: AC

## 2022-06-07 NOTE — Progress Notes (Signed)
Established Patient Office Visit  Subjective   Patient ID: Debra Campbell, female    DOB: 1932-08-28  Age: 88 y.o. MRN: [id number]  Chief Complaint  Patient presents with   Venous Stasis Ulcer    HPI Here with nephew. Debra Campbell presents for cellulitis follow up. She was seen on 05/28/22 for a fever. She also had increased edema on her lower legs and a erythematous warm rash on her LLE. She was diagnosed with cellulitis and started on doxycyline. She reports improvement in warmth and erythema although they remain tender. She had double unna boots applied on 06/03/22 for increase edema with bilateral weeping. She also had a shallow ulcer of her LLE. She has been taking 80 mg of lasix since her last visit. She has been afebrile. Denies chest pain or increased shortness of breath.   Past Medical History:  Diagnosis Date   Aortic calcification ([place]) 2013   Atherosclerotic calcifications of the abdominal aorta and branch noted on CT Abd/Pelvis   Arthritis    legs, lower back, hands   Cancer ([place])    endometrial   Diabetes mellitus    Ectopic pregnancy    Eczema    Headache(784.0)    hx of   History of kidney stones    Hyperlipidemia    no meds   Hypertension    Hypothyroidism    Persistent atrial fibrillation ([place]) 09/28/2016   Relatively new Dx: Asymptomatic.  Rate controlled without medication.   SVD (spontaneous vaginal delivery)    x 3   Thyroid disease     ROS As per HPI.    Objective:     BP 116/63   Pulse 72   Temp 97.7 F (36.5 C) (Temporal)   Ht '5\' 5"'$  (1.651 m)   Wt 203 lb 6 oz (92.3 kg)   SpO2 95%   BMI 33.84 kg/m  Wt Readings from Last 3 Encounters:  06/07/22 203 lb 6 oz (92.3 kg)  06/03/22 206 lb 6 oz (93.6 kg)  05/28/22 200 lb (90.7 kg)      Physical Exam Vitals and nursing note reviewed.  Constitutional:      General: She is not in acute distress.    Appearance: She is not ill-appearing, toxic-appearing or diaphoretic.  Cardiovascular:     Rate  and Rhythm: Normal rate and regular rhythm.     Heart sounds: Murmur heard.     No friction rub. No gallop.  Pulmonary:     Effort: Pulmonary effort is normal. No respiratory distress.     Breath sounds: Normal breath sounds. No rhonchi or rales.  Musculoskeletal:     Right lower leg: No edema.     Left lower leg: No edema.  Skin:    General: Skin is warm and dry.     Comments: BLEs with mild erythema and tenderness. No warmth. Healed ulcer to LLE. No exudate today.   Neurological:     Mental Status: She is alert and oriented to person, place, and time. Mental status is at baseline.     Gait: Gait abnormal (arrives in wheelchair).     No results found for any visits on 06/07/22.    The ASCVD Risk score (Arnett DK, et al., 2019) failed to calculate for the following reasons:   The 2019 ASCVD risk score is only valid for ages 57 to 14    Assessment & Plan:   Debra Campbell was seen today for venous stasis ulcer.  Diagnoses and all orders for  this visit:  Cellulitis of left lower extremity Will complete and additional 7 days of doxy as she continues to report pain and has some mild erythema.  -     doxycycline (VIBRA-TABS) 100 MG tablet; Take 1 tablet (100 mg total) by mouth 2 (two) times daily for 7 days. 1 po bid  Peripheral edema Venous stasis ulcer of left lower leg with edema of left lower leg (HCC) Resolved with unna boots. Continue lasix 40 mg daily, increase to 80 mg daily for increased swelling or weight gain. Elevate legs.   Return if symptoms worsen or fail to improve. Strict return precautions given.    The patient indicates understanding of these issues and agrees with the plan.  Gwenlyn Perking, FNP

## 2022-06-07 NOTE — Patient Instructions (Signed)

## 2022-07-12 ENCOUNTER — Other Ambulatory Visit: Payer: Self-pay | Admitting: Cardiology

## 2022-07-12 DIAGNOSIS — I4821 Permanent atrial fibrillation: Secondary | ICD-10-CM

## 2022-07-12 NOTE — Telephone Encounter (Signed)
Prescription refill request for Xarelto received.  Indication:Afib Last office visit:5/23 Weight:92.3 kg Age:88 Scr:1.0 CrCl:56.66 ml/min  Prescription refilled

## 2022-07-19 ENCOUNTER — Encounter: Payer: Self-pay | Admitting: Nurse Practitioner

## 2022-07-19 ENCOUNTER — Ambulatory Visit (INDEPENDENT_AMBULATORY_CARE_PROVIDER_SITE_OTHER): Payer: Medicare Other | Admitting: Nurse Practitioner

## 2022-07-19 VITALS — BP 128/71 | HR 79 | Temp 97.8°F | Ht 65.0 in | Wt 199.8 lb

## 2022-07-19 DIAGNOSIS — R600 Localized edema: Secondary | ICD-10-CM

## 2022-07-19 DIAGNOSIS — M8588 Other specified disorders of bone density and structure, other site: Secondary | ICD-10-CM

## 2022-07-19 DIAGNOSIS — E785 Hyperlipidemia, unspecified: Secondary | ICD-10-CM

## 2022-07-19 DIAGNOSIS — I700 Atherosclerosis of aorta: Secondary | ICD-10-CM | POA: Diagnosis not present

## 2022-07-19 DIAGNOSIS — E1159 Type 2 diabetes mellitus with other circulatory complications: Secondary | ICD-10-CM | POA: Diagnosis not present

## 2022-07-19 DIAGNOSIS — E1142 Type 2 diabetes mellitus with diabetic polyneuropathy: Secondary | ICD-10-CM | POA: Diagnosis not present

## 2022-07-19 DIAGNOSIS — I4821 Permanent atrial fibrillation: Secondary | ICD-10-CM | POA: Diagnosis not present

## 2022-07-19 DIAGNOSIS — I152 Hypertension secondary to endocrine disorders: Secondary | ICD-10-CM

## 2022-07-19 DIAGNOSIS — E034 Atrophy of thyroid (acquired): Secondary | ICD-10-CM | POA: Diagnosis not present

## 2022-07-19 DIAGNOSIS — E1169 Type 2 diabetes mellitus with other specified complication: Secondary | ICD-10-CM

## 2022-07-19 DIAGNOSIS — R609 Edema, unspecified: Secondary | ICD-10-CM | POA: Diagnosis not present

## 2022-07-19 DIAGNOSIS — I350 Nonrheumatic aortic (valve) stenosis: Secondary | ICD-10-CM | POA: Diagnosis not present

## 2022-07-19 LAB — BAYER DCA HB A1C WAIVED: HB A1C (BAYER DCA - WAIVED): 6.3 % — ABNORMAL HIGH (ref 4.8–5.6)

## 2022-07-19 MED ORDER — LEVOTHYROXINE SODIUM 125 MCG PO TABS: 125.0000 ug | ORAL_TABLET | Freq: Every day | ORAL | 1 refills | Status: DC

## 2022-07-19 MED ORDER — METOPROLOL SUCCINATE ER 25 MG PO TB24: 25.0000 mg | ORAL_TABLET | Freq: Every day | ORAL | 1 refills | Status: DC

## 2022-07-19 MED ORDER — RIVAROXABAN 20 MG PO TABS: ORAL_TABLET | ORAL | 1 refills | Status: DC

## 2022-07-19 MED ORDER — FUROSEMIDE 40 MG PO TABS: 80.0000 mg | ORAL_TABLET | Freq: Two times a day (BID) | ORAL | 1 refills | Status: DC

## 2022-07-19 MED ORDER — GABAPENTIN 300 MG PO CAPS: 300.0000 mg | ORAL_CAPSULE | Freq: Two times a day (BID) | ORAL | 1 refills | Status: DC

## 2022-07-19 MED ORDER — METFORMIN HCL 500 MG PO TABS: 500.0000 mg | ORAL_TABLET | Freq: Two times a day (BID) | ORAL | 1 refills | Status: DC

## 2022-07-19 NOTE — Progress Notes (Signed)
Subjective:    Patient ID: Debra Campbell, female    DOB: Nov 19, 1932, 88 y.o.   MRN: [id number]   Chief Complaint: Medical Management of Chronic Issues    HPI:  Debra Campbell is a 88 y.o. who identifies as a female who was assigned female at birth.   Social history: Lives with: her husband Work history: retired   Scientist, forensic in today for follow up of the following chronic medical issues:  1. Type 2 diabetes mellitus with diabetic polyneuropathy, without long-term current use of insulin ([place]) She doe snot check blood sugars daily. When she does check them they are around 120.   2. Hypertension associated with type 2 diabetes mellitus ([place]) No c/o chest pain, sob or headache. Does not check blood pressure at home. BP Readings from Last 3 Encounters:  07/19/22 128/71  06/07/22 116/63  06/03/22 103/64     3. Hyperlipidemia associated with type 2 diabetes mellitus ([place]) Does not watch diet and does no dedicated exercsie Lab Results  Component Value Date   CHOL 195 04/15/2022   HDL 56 04/15/2022   LDLCALC 113 (H) 04/15/2022   TRIG 146 04/15/2022   CHOLHDL 3.5 04/15/2022   The ASCVD Risk score (Arnett DK, et al., 2019) failed to calculate for the following reasons:   The 2019 ASCVD risk score is only valid for ages 12 to 69   4. Hypothyroidism due to acquired atrophy of thyroid No problems that she is aware of. Lab Results  Component Value Date   TSH 1.150 01/15/2022     5. Mild aortic stenosis by prior echocardiogram 6. Aortic calcification ([place]) Last saw cardiology on 04/12/22. No change was made to plan of care  7. Diabetic polyneuropathy associated with type 2 diabetes mellitus (HCC) Has numbness and tingling of bil feet.  8. Peripheral edema Has daly edema that resolves a lot at night  9. Osteopenia of lumbar spine Last dexascan was done on 01/15/22. Her t score was - 0.3  10. Morbid obesity ([place]) No recent weight changes Wt Readings from Last 3  Encounters:  07/19/22 199 lb 12.8 oz (90.6 kg)  06/07/22 203 lb 6 oz (92.3 kg)  06/03/22 206 lb 6 oz (93.6 kg)   BMI Readings from Last 3 Encounters:  07/19/22 33.25 kg/m  06/07/22 33.84 kg/m  06/03/22 34.34 kg/m      New complaints: None today  Allergies  Allergen Reactions   Ace Inhibitors Cough   Iohexol      Desc: HIVES 40 YEARS AGO    Statins     myalgia   Penicillins Swelling and Rash    Has patient had a PCN reaction causing immediate rash, facial/tongue/throat swelling, SOB or lightheadedness with hypotension: Yes Has patient had a PCN reaction causing severe rash involving mucus membranes or skin necrosis: No Has patient had a PCN reaction that required hospitalization Yes Has patient had a PCN reaction occurring within the last 10 years: No If all of the above answers are "NO", then may proceed with Cephalosporin use.    Sulfa Antibiotics Rash   Outpatient Encounter Medications as of 07/19/2022  Medication Sig   Accu-Chek Softclix Lancets lancets CHECK BLOOD SUGAR ONCE DAILY DX E11.42   acetaminophen (TYLENOL) 500 MG tablet Take 1 tablet (500 mg total) by mouth every 8 (eight) hours as needed.   furosemide (LASIX) 40 MG tablet Take 2 tablets (80 mg total) by mouth 2 (two) times daily. MAY TAKE AN ADDITIONAL 40 MG IF  WEIGHT GAIN OF 3 LBS OR MORE OVER NIGHT   gabapentin (NEURONTIN) 300 MG capsule Take 1 capsule (300 mg total) by mouth 2 (two) times daily.   glucose blood (ACCU-CHEK GUIDE) test strip CHECK BLOOD SUGAR ONCE DAILY DX E11.42   levothyroxine (SYNTHROID) 125 MCG tablet Take 1 tablet (125 mcg total) by mouth daily.   metFORMIN (GLUCOPHAGE) 500 MG tablet Take 1 tablet (500 mg total) by mouth 2 (two) times daily with a meal.   metoprolol succinate (TOPROL-XL) 25 MG 24 hr tablet Take 1 tablet (25 mg total) by mouth daily.   rivaroxaban (XARELTO) 20 MG TABS tablet TAKE 1 TABLET BY MOUTH EVERY DAY WITH BREAKFAST   [DISCONTINUED] nystatin cream (MYCOSTATIN)  Apply 1 application topically 2 (two) times daily. (Patient not taking: Reported on 07/19/2022)   [DISCONTINUED] ondansetron (ZOFRAN-ODT) 8 MG disintegrating tablet Take 1 tablet (8 mg total) by mouth every 8 (eight) hours as needed. (Patient not taking: Reported on 07/19/2022)   [DISCONTINUED] tamsulosin (FLOMAX) 0.4 MG CAPS capsule Take 1 capsule (0.4 mg total) by mouth daily after supper. (Patient not taking: Reported on 07/19/2022)   No facility-administered encounter medications on file as of 07/19/2022.    Past Surgical History:  Procedure Laterality Date   ABDOMINAL HYSTERECTOMY Bilateral 06/26/2013   Procedure: TOTAL ABDOMINAL HYSTERECTOMY WITH BILATERAL SALPINGO OOPHERECTOMY WITH PELVIC LYMPHADNECTOMY;  Surgeon: Alvino Chapel, MD;  Location: [facility];  Service: Gynecology;  Laterality: Bilateral;   BACK SURGERY  06/26/2013   ECTOPIC PREGNANCY SURGERY     HERNIA REPAIR     HYSTEROSCOPY WITH D & C N/A 04/26/2013   Procedure: DILATATION AND CURETTAGE /HYSTEROSCOPY;  Surgeon: Woodroe Mode, MD;  Location: [facility];  Service: Gynecology;  Laterality: N/A;   JOINT REPLACEMENT     LAPAROTOMY     for ectopic pregnancy   LAPAROTOMY N/A 06/26/2013   Procedure: EXPLORATORY LAPAROTOMY;  Surgeon: Alvino Chapel, MD;  Location: [facility];  Service: Gynecology;  Laterality: N/A;   lt knee arthroscopy     rt total hip replacement     TONSILLECTOMY     TOTAL KNEE ARTHROPLASTY Left 12/22/2016   Procedure: LEFT TOTAL KNEE ARTHROPLASTY;  Surgeon: Latanya Maudlin, MD;  Location: [facility];  Service: Orthopedics;  Laterality: Left;   TRANSTHORACIC ECHOCARDIOGRAM  09/2018   EF 60-65%. Mild AS.  Mod-Severe PAH 60 mmHg   WISDOM TOOTH EXTRACTION      Family History  Problem Relation Age of Onset   Heart disease Father    Thyroid disease Father    Heart attack Father    Heart disease Sister        open heart surgery   Cancer Sister        breast   Neuropathy Sister        secondary to cancer  treatment   Breast cancer Sister    Suicidality Brother 54   Arthritis Mother    Cancer Daughter    Breast cancer Daughter       Controlled substance contract: n/a     Review of Systems  Constitutional:  Negative for diaphoresis.  Eyes:  Negative for pain.  Respiratory:  Negative for shortness of breath.   Cardiovascular:  Negative for chest pain, palpitations and leg swelling.  Gastrointestinal:  Negative for abdominal pain.  Endocrine: Negative for polydipsia.  Skin:  Negative for rash.  Neurological:  Negative for dizziness, weakness and headaches.  Hematological:  Does not bruise/bleed easily.  All other systems  reviewed and are negative.      Objective:   Physical Exam Vitals and nursing note reviewed.  Constitutional:      General: She is not in acute distress.    Appearance: Normal appearance. She is well-developed.  HENT:     Head: Normocephalic.     Right Ear: Tympanic membrane normal.     Left Ear: Tympanic membrane normal.     Nose: Nose normal.     Mouth/Throat:     Mouth: Mucous membranes are moist.  Eyes:     Pupils: Pupils are equal, round, and reactive to light.  Neck:     Vascular: No carotid bruit or JVD.  Cardiovascular:     Rate and Rhythm: Normal rate and regular rhythm.     Heart sounds: Normal heart sounds.  Pulmonary:     Effort: Pulmonary effort is normal. No respiratory distress.     Breath sounds: Normal breath sounds. No wheezing or rales.  Chest:     Chest wall: No tenderness.  Abdominal:     General: Bowel sounds are normal. There is no distension or abdominal bruit.     Palpations: Abdomen is soft. There is no hepatomegaly, splenomegaly, mass or pulsatile mass.     Tenderness: There is no abdominal tenderness.  Musculoskeletal:        General: Normal range of motion.     Cervical back: Normal range of motion and neck supple.  Lymphadenopathy:     Cervical: No cervical adenopathy.  Skin:    General: Skin is warm and dry.   Neurological:     Mental Status: She is alert and oriented to person, place, and time.     Deep Tendon Reflexes: Reflexes are normal and symmetric.  Psychiatric:        Behavior: Behavior normal.        Thought Content: Thought content normal.        Judgment: Judgment normal.     BP 128/71   Pulse 79   Temp 97.8 F (36.6 C) (Temporal)   Ht 5' 5"  (1.651 m)   Wt 199 lb 12.8 oz (90.6 kg)   BMI 33.25 kg/m      Hgba1c 6.3%  Assessment & Plan:  Debra Campbell comes in today with chief complaint of Medical Management of Chronic Issues   Diagnosis and orders addressed:  1. Type 2 diabetes mellitus with diabetic polyneuropathy, without long-term current use of insulin (HCC) Continue to wathc carbs in diet - Bayer DCA Hb A1c Waived - metFORMIN (GLUCOPHAGE) 500 MG tablet; Take 1 tablet (500 mg total) by mouth 2 (two) times daily with a meal.  Dispense: 180 tablet; Refill: 1  2. Hypertension associated with type 2 diabetes mellitus (HCC) Low sodium diet - CBC with Differential/Platelet - CMP14+EGFR - metoprolol succinate (TOPROL-XL) 25 MG 24 hr tablet; Take 1 tablet (25 mg total) by mouth daily.  Dispense: 90 tablet; Refill: 1  3. Hyperlipidemia associated with type 2 diabetes mellitus ([facility]) Low fat diet - Lipid panel  4. Hypothyroidism due to acquired atrophy of thyroid Labs pending - Thyroid Panel With TSH - levothyroxine (SYNTHROID) 125 MCG tablet; Take 1 tablet (125 mcg total) by mouth daily.  Dispense: 90 tablet; Refill: 1  5. Mild aortic stenosis by prior echocardiogram Keep follow up with cardiology  6. Aortic calcification (HCC)  7. Diabetic polyneuropathy associated with type 2 diabetes mellitus ([place]) Do not go barefooted Check feet daily - gabapentin (NEURONTIN) 300 MG capsule; Take  1 capsule (300 mg total) by mouth 2 (two) times daily.  Dispense: 180 capsule; Refill: 1  8. Peripheral edema Elevate legs when sitting Compression socks daily - furosemide  (LASIX) 40 MG tablet; Take 2 tablets (80 mg total) by mouth 2 (two) times daily. MAY TAKE AN ADDITIONAL 40 MG IF WEIGHT GAIN OF 3 LBS OR MORE OVER NIGHT  Dispense: 360 tablet; Refill: 1  9. Osteopenia of lumbar spine Weight bearing exercise  10. Morbid obesity ([place]) Discussed diet and exercise for person with BMI >25 Will recheck weight in 3-6 months   11. Permanent atrial fibrillation (HCC) CHA2DS2Vasc = 5; Xarelto Report any bleeding issue - rivaroxaban (XARELTO) 20 MG TABS tablet; TAKE 1 TABLET BY MOUTH EVERY DAY WITH BREAKFAST  Dispense: 90 tablet; Refill: 1   Labs pending Health Maintenance reviewed Diet and exercise encouraged  Follow up plan: 3  months   [name] Hassell Done, FNP

## 2022-07-19 NOTE — Patient Instructions (Signed)
Peripheral Edema  Peripheral edema is swelling that is caused by a buildup of fluid. Peripheral edema most often affects the lower legs, ankles, and feet. It can also develop in the arms, hands, and face. The area of the body that has peripheral edema will look swollen. It may also feel heavy or warm. Your clothes may start to feel tight. Pressing on the area may make a temporary dent in your skin (pitting edema). You may not be able to move your swollen arm or leg as much as usual. There are many causes of peripheral edema. It can happen because of a complication of other conditions such as heart failure, kidney disease, or a problem with your circulation. It also can be a side effect of certain medicines or happen because of an infection. It often happens to women during pregnancy. Sometimes, the cause is not known. Follow these instructions at home: Managing pain, stiffness, and swelling  Raise (elevate) your legs while you are sitting or lying down. Move around often to prevent stiffness and to reduce swelling. Do not sit or stand for long periods of time. Do not wear tight clothing. Do not wear garters on your upper legs. Exercise your legs to get your circulation going. This helps to move the fluid back into your blood vessels, and it may help the swelling go down. Wear compression stockings as told by your health care provider. These stockings help to prevent blood clots and reduce swelling in your legs. It is important that these are the correct size. These stockings should be prescribed by your doctor to prevent possible injuries. If elastic bandages or wraps are recommended, use them as told by your health care provider. Medicines Take over-the-counter and prescription medicines only as told by your health care provider. Your health care provider may prescribe medicine to help your body get rid of excess water (diuretic). Take this medicine if you are told to take it. General  instructions Eat a low-salt (low-sodium) diet as told by your health care provider. Sometimes, eating less salt may reduce swelling. Pay attention to any changes in your symptoms. Moisturize your skin daily to help prevent skin from cracking and draining. Keep all follow-up visits. This is important. Contact a health care provider if: You have a fever. You have swelling in only one leg. You have increased swelling, redness, or pain in one or both of your legs. You have drainage or sores at the area where you have edema. Get help right away if: You have edema that starts suddenly or is getting worse, especially if you are pregnant or have a medical condition. You develop shortness of breath, especially when you are lying down. You have pain in your chest or abdomen. You feel weak. You feel like you will faint. These symptoms may be an emergency. Get help right away. Call 911. Do not wait to see if the symptoms will go away. Do not drive yourself to the hospital. Summary Peripheral edema is swelling that is caused by a buildup of fluid. Peripheral edema most often affects the lower legs, ankles, and feet. Move around often to prevent stiffness and to reduce swelling. Do not sit or stand for long periods of time. Pay attention to any changes in your symptoms. Contact a health care provider if you have edema that starts suddenly or is getting worse, especially if you are pregnant or have a medical condition. Get help right away if you develop shortness of breath, especially when lying down.   This information is not intended to replace advice given to you by your health care provider. Make sure you discuss any questions you have with your health care provider. Document Revised: 07/20/2021 Document Reviewed: 07/20/2021 Elsevier Patient Education  2023 [employer]  

## 2022-07-20 LAB — CMP14+EGFR
ALT: 9 IU/L (ref 0–32)
AST: 19 IU/L (ref 0–40)
Albumin/Globulin Ratio: 1.4 (ref 1.2–2.2)
Albumin: 4 g/dL (ref 3.7–4.7)
Alkaline Phosphatase: 55 IU/L (ref 44–121)
BUN/Creatinine Ratio: 15 (ref 12–28)
BUN: 15 mg/dL (ref 8–27)
Bilirubin Total: 0.8 mg/dL (ref 0.0–1.2)
CO2: 22 mmol/L (ref 20–29)
Calcium: 9.5 mg/dL (ref 8.7–10.3)
Chloride: 103 mmol/L (ref 96–106)
Creatinine, Ser: 1.02 mg/dL — ABNORMAL HIGH (ref 0.57–1.00)
Globulin, Total: 2.9 g/dL (ref 1.5–4.5)
Glucose: 163 mg/dL — ABNORMAL HIGH (ref 70–99)
Potassium: 4 mmol/L (ref 3.5–5.2)
Sodium: 140 mmol/L (ref 134–144)
Total Protein: 6.9 g/dL (ref 6.0–8.5)
eGFR: 53 mL/min/{1.73_m2} — ABNORMAL LOW (ref >59)

## 2022-07-20 LAB — THYROID PANEL WITH TSH
Free Thyroxine Index: 2.6 (ref 1.2–4.9)
T3 Uptake Ratio: 30 % (ref 24–39)
T4, Total: 8.5 ug/dL (ref 4.5–12.0)
TSH: 0.946 u[IU]/mL (ref 0.450–4.500)

## 2022-07-20 LAB — CBC WITH DIFFERENTIAL/PLATELET
Basophils Absolute: 0.1 10*3/uL (ref 0.0–0.2)
Basos: 1 %
EOS (ABSOLUTE): 0.2 10*3/uL (ref 0.0–0.4)
Eos: 5 %
Hematocrit: 39.1 % (ref 34.0–46.6)
Hemoglobin: 13 g/dL (ref 11.1–15.9)
Immature Grans (Abs): 0 10*3/uL (ref 0.0–0.1)
Immature Granulocytes: 0 %
Lymphocytes Absolute: 1.4 10*3/uL (ref 0.7–3.1)
Lymphs: 30 %
MCH: 31.7 pg (ref 26.6–33.0)
MCHC: 33.2 g/dL (ref 31.5–35.7)
MCV: 95 fL (ref 79–97)
Monocytes Absolute: 0.4 10*3/uL (ref 0.1–0.9)
Monocytes: 9 %
Neutrophils Absolute: 2.6 10*3/uL (ref 1.4–7.0)
Neutrophils: 55 %
Platelets: 171 10*3/uL (ref 150–450)
RBC: 4.1 x10E6/uL (ref 3.77–5.28)
RDW: 13.1 % (ref 11.7–15.4)
WBC: 4.7 10*3/uL (ref 3.4–10.8)

## 2022-07-20 LAB — LIPID PANEL
Chol/HDL Ratio: 4 ratio (ref 0.0–4.4)
Cholesterol, Total: 194 mg/dL (ref 100–199)
HDL: 48 mg/dL (ref >39)
LDL Chol Calc (NIH): 115 mg/dL — ABNORMAL HIGH (ref 0–99)
Triglycerides: 178 mg/dL — ABNORMAL HIGH (ref 0–149)
VLDL Cholesterol Cal: 31 mg/dL (ref 5–40)

## 2022-08-01 ENCOUNTER — Other Ambulatory Visit: Payer: Self-pay | Admitting: Nurse Practitioner

## 2022-08-01 DIAGNOSIS — E1159 Type 2 diabetes mellitus with other circulatory complications: Secondary | ICD-10-CM

## 2022-09-02 ENCOUNTER — Ambulatory Visit (INDEPENDENT_AMBULATORY_CARE_PROVIDER_SITE_OTHER): Payer: Medicare Other | Admitting: Family Medicine

## 2022-09-02 ENCOUNTER — Encounter: Payer: Self-pay | Admitting: Family Medicine

## 2022-09-02 ENCOUNTER — Ambulatory Visit (HOSPITAL_COMMUNITY)
Admission: RE | Admit: 2022-09-02 | Discharge: 2022-09-02 | Disposition: A | Discharge status: Home / Self Care | Payer: Medicare Other | Source: Ambulatory Visit | Attending: Family Medicine | Admitting: Family Medicine

## 2022-09-02 VITALS — BP 122/70 | HR 79 | Temp 98.8°F | Ht 65.0 in | Wt 199.0 lb

## 2022-09-02 DIAGNOSIS — M7989 Other specified soft tissue disorders: Secondary | ICD-10-CM

## 2022-09-02 DIAGNOSIS — L539 Erythematous condition, unspecified: Secondary | ICD-10-CM | POA: Diagnosis not present

## 2022-09-02 DIAGNOSIS — M79662 Pain in left lower leg: Secondary | ICD-10-CM | POA: Insufficient documentation

## 2022-09-02 DIAGNOSIS — L03116 Cellulitis of left lower limb: Secondary | ICD-10-CM | POA: Diagnosis not present

## 2022-09-02 MED ORDER — DOXYCYCLINE HYCLATE 100 MG PO TABS: 100.0000 mg | ORAL_TABLET | Freq: Two times a day (BID) | ORAL | 0 refills | Status: DC

## 2022-09-02 NOTE — Progress Notes (Signed)
Subjective:  Patient ID: Debra Campbell, female    DOB: Mar 29, 1932, 88 y.o.   MRN: [id number]  Patient Care Team: Chevis Pretty, [place] as PCP - General (Nurse Practitioner) Leonie Man, MD as PCP - Cardiology (Cardiology) Marti Sleigh, MD as Attending Physician (Gynecology) Latanya Maudlin, MD as Consulting Physician (Orthopedic Surgery) Leonie Man, MD as Consulting Physician (Cardiology)   Chief Complaint:  left leg hurts (Fever/cellulitis)   HPI: Debra Campbell is a 88 y.o. female presenting on 09/02/2022 for left leg hurts (Fever/cellulitis)   Pt presents today with left lower leg pain, swelling, and redness, and bruising. States this started suddenly yesterday. She states her calf is very sore and tender to touch. She denies recent long travel or procedures. She is sedentary most of the time. She is on Xarelto for A-Fib. No shortness of breath, chest pain, fever, chills, weakness, confusion, palpitations, dizziness, or syncope. She denies injury. States she was scratching her leg yesterday due to significant pruritis.       Relevant past medical, surgical, family, and social history reviewed and updated as indicated.  Allergies and medications reviewed and updated. Data reviewed: Chart in Epic.   Past Medical History:  Diagnosis Date   Aortic calcification ([place]) 2013   Atherosclerotic calcifications of the abdominal aorta and branch noted on CT Abd/Pelvis   Arthritis    legs, lower back, hands   Cancer ([place])    endometrial   Diabetes mellitus    Ectopic pregnancy    Eczema    Headache(784.0)    hx of   History of kidney stones    Hyperlipidemia    no meds   Hypertension    Hypothyroidism    Persistent atrial fibrillation ([place]) 09/28/2016   Relatively new Dx: Asymptomatic.  Rate controlled without medication.   SVD (spontaneous vaginal delivery)    x 3   Thyroid disease     Past Surgical History:  Procedure Laterality Date    ABDOMINAL HYSTERECTOMY Bilateral 06/26/2013   Procedure: TOTAL ABDOMINAL HYSTERECTOMY WITH BILATERAL SALPINGO OOPHERECTOMY WITH PELVIC LYMPHADNECTOMY;  Surgeon: Alvino Chapel, MD;  Location: [facility];  Service: Gynecology;  Laterality: Bilateral;   BACK SURGERY  06/26/2013   ECTOPIC PREGNANCY SURGERY     HERNIA REPAIR     HYSTEROSCOPY WITH D & C N/A 04/26/2013   Procedure: DILATATION AND CURETTAGE /HYSTEROSCOPY;  Surgeon: Woodroe Mode, MD;  Location: [facility];  Service: Gynecology;  Laterality: N/A;   JOINT REPLACEMENT     LAPAROTOMY     for ectopic pregnancy   LAPAROTOMY N/A 06/26/2013   Procedure: EXPLORATORY LAPAROTOMY;  Surgeon: Alvino Chapel, MD;  Location: [facility];  Service: Gynecology;  Laterality: N/A;   lt knee arthroscopy     rt total hip replacement     TONSILLECTOMY     TOTAL KNEE ARTHROPLASTY Left 12/22/2016   Procedure: LEFT TOTAL KNEE ARTHROPLASTY;  Surgeon: Latanya Maudlin, MD;  Location: [facility];  Service: Orthopedics;  Laterality: Left;   TRANSTHORACIC ECHOCARDIOGRAM  09/2018   EF 60-65%. Mild AS.  Mod-Severe PAH 60 mmHg   WISDOM TOOTH EXTRACTION      Social History   Socioeconomic History   Marital status: Married    Spouse name: Not on file   Number of children: 3   Years of education: Not on file   Highest education level: High school graduate  Occupational History   Occupation: Retired    Comment: [employer]  Tobacco  Use   Smoking status: Never   Smokeless tobacco: Never  Vaping Use   Vaping Use: Never used  Substance and Sexual Activity   Alcohol use: No   Drug use: No   Sexual activity: Not Currently    Birth control/protection: Post-menopausal  Other Topics Concern   Not on file  Social History Narrative   Not on file   Social Determinants of Health   Financial Resource Strain: Low Risk  (11/18/2020)   Overall Financial Resource Strain (CARDIA)    Difficulty of Paying Living Expenses: Not hard at all  Food Insecurity: No Food  Insecurity (11/18/2020)   Hunger Vital Sign    Worried About Running Out of Food in the Last Year: Never true    Ran Out of Food in the Last Year: Never true  Transportation Needs: No Transportation Needs (11/18/2020)   PRAPARE - Hydrologist (Medical): No    Lack of Transportation (Non-Medical): No  Physical Activity: Inactive (11/18/2020)   Exercise Vital Sign    Days of Exercise per Week: 0 days    Minutes of Exercise per Session: 0 min  Stress: No Stress Concern Present (11/18/2020)   [place]    Feeling of Stress : Only a little  Social Connections: Moderately Integrated (11/18/2020)   Social Connection and Isolation Panel [NHANES]    Frequency of Communication with Friends and Family: More than three times a week    Frequency of Social Gatherings with Friends and Family: Three times a week    Attends Religious Services: More than 4 times per year    Active Member of Clubs or Organizations: No    Attends Archivist Meetings: Never    Marital Status: Married  Human resources officer Violence: Not At Risk (11/18/2020)   Humiliation, Afraid, Rape, and Kick questionnaire    Fear of Current or Ex-Partner: No    Emotionally Abused: No    Physically Abused: No    Sexually Abused: No    Outpatient Encounter Medications as of 09/02/2022  Medication Sig   Accu-Chek Softclix Lancets lancets CHECK BLOOD SUGAR ONCE DAILY DX E11.42   acetaminophen (TYLENOL) 500 MG tablet Take 1 tablet (500 mg total) by mouth every 8 (eight) hours as needed.   doxycycline (VIBRA-TABS) 100 MG tablet Take 1 tablet (100 mg total) by mouth 2 (two) times daily for 10 days. 1 po bid   furosemide (LASIX) 40 MG tablet Take 2 tablets (80 mg total) by mouth 2 (two) times daily. MAY TAKE AN ADDITIONAL 40 MG IF WEIGHT GAIN OF 3 LBS OR MORE OVER NIGHT   gabapentin (NEURONTIN) 300 MG capsule Take 1 capsule (300 mg total) by  mouth 2 (two) times daily.   glucose blood (ACCU-CHEK GUIDE) test strip CHECK BLOOD SUGAR ONCE DAILY DX E11.42   levothyroxine (SYNTHROID) 125 MCG tablet Take 1 tablet (125 mcg total) by mouth daily.   metFORMIN (GLUCOPHAGE) 500 MG tablet Take 1 tablet (500 mg total) by mouth 2 (two) times daily with a meal.   metoprolol succinate (TOPROL-XL) 25 MG 24 hr tablet Take 1 tablet (25 mg total) by mouth daily.   rivaroxaban (XARELTO) 20 MG TABS tablet TAKE 1 TABLET BY MOUTH EVERY DAY WITH BREAKFAST   No facility-administered encounter medications on file as of 09/02/2022.    Allergies  Allergen Reactions   Ace Inhibitors Cough   Iohexol      Desc: HIVES 40  YEARS AGO    Statins     myalgia   Penicillins Swelling and Rash    Has patient had a PCN reaction causing immediate rash, facial/tongue/throat swelling, SOB or lightheadedness with hypotension: Yes Has patient had a PCN reaction causing severe rash involving mucus membranes or skin necrosis: No Has patient had a PCN reaction that required hospitalization Yes Has patient had a PCN reaction occurring within the last 10 years: No If all of the above answers are "NO", then may proceed with Cephalosporin use.    Sulfa Antibiotics Rash    Review of Systems  Constitutional:  Positive for chills and fever. Negative for activity change, appetite change, diaphoresis, fatigue and unexpected weight change.  HENT: Negative.    Eyes: Negative.  Negative for photophobia and visual disturbance.  Respiratory:  Negative for cough, chest tightness and shortness of breath.   Cardiovascular:  Positive for leg swelling. Negative for chest pain and palpitations.  Gastrointestinal:  Negative for abdominal pain, blood in stool, constipation, diarrhea, nausea and vomiting.  Endocrine: Negative.   Genitourinary:  Negative for decreased urine volume, difficulty urinating, dysuria, frequency and urgency.  Musculoskeletal:  Positive for gait problem and myalgias.  Negative for arthralgias.  Skin:  Positive for color change and rash.  Allergic/Immunologic: Negative.   Neurological:  Negative for dizziness, tremors, seizures, syncope, facial asymmetry, speech difficulty, weakness, light-headedness, numbness and headaches.  Hematological: Negative.   Psychiatric/Behavioral:  Negative for confusion, hallucinations, sleep disturbance and suicidal ideas.   All other systems reviewed and are negative.       Objective:  BP 122/70   Pulse 79   Temp 98.8 F (37.1 C)   Ht 5' 5"  (1.651 m)   Wt 199 lb (90.3 kg)   SpO2 95%   BMI 33.12 kg/m    Wt Readings from Last 3 Encounters:  09/02/22 199 lb (90.3 kg)  07/19/22 199 lb 12.8 oz (90.6 kg)  06/07/22 203 lb 6 oz (92.3 kg)    Physical Exam Vitals and nursing note reviewed.  Constitutional:      General: She is not in acute distress.    Appearance: She is obese. She is not ill-appearing, toxic-appearing or diaphoretic.  HENT:     Head: Normocephalic and atraumatic.     Mouth/Throat:     Mouth: Mucous membranes are moist.  Eyes:     Pupils: Pupils are equal, round, and reactive to light.  Cardiovascular:     Rate and Rhythm: Normal rate. Rhythm irregular.     Heart sounds: Murmur heard.     Systolic murmur is present with a grade of 3/6.  Pulmonary:     Effort: Pulmonary effort is normal.     Breath sounds: Normal breath sounds.  Abdominal:     Palpations: Abdomen is soft.  Musculoskeletal:     Left lower leg: Edema (with calf tenderness and positive Homan's sign) present.  Skin:    General: Skin is warm and dry.     Capillary Refill: Capillary refill takes less than 2 seconds.     Findings: Abrasion, bruising, ecchymosis, erythema, rash and wound present.          Comments: Left calf larger than right  Neurological:     General: No focal deficit present.     Mental Status: She is alert and oriented to person, place, and time.  Psychiatric:        Mood and Affect: Mood normal.  Behavior: Behavior normal.        Thought Content: Thought content normal.        Judgment: Judgment normal.     Results for orders placed or performed in visit on 07/19/22  CBC with Differential/Platelet  Result Value Ref Range   WBC 4.7 3.4 - 10.8 x10E3/uL   RBC 4.10 3.77 - 5.28 x10E6/uL   Hemoglobin 13.0 11.1 - 15.9 g/dL   Hematocrit 39.1 34.0 - 46.6 %   MCV 95 79 - 97 fL   MCH 31.7 26.6 - 33.0 pg   MCHC 33.2 31.5 - 35.7 g/dL   RDW 13.1 11.7 - 15.4 %   Platelets 171 150 - 450 x10E3/uL   Neutrophils 55 Not Estab. %   Lymphs 30 Not Estab. %   Monocytes 9 Not Estab. %   Eos 5 Not Estab. %   Basos 1 Not Estab. %   Neutrophils Absolute 2.6 1.4 - 7.0 x10E3/uL   Lymphocytes Absolute 1.4 0.7 - 3.1 x10E3/uL   Monocytes Absolute 0.4 0.1 - 0.9 x10E3/uL   EOS (ABSOLUTE) 0.2 0.0 - 0.4 x10E3/uL   Basophils Absolute 0.1 0.0 - 0.2 x10E3/uL   Immature Granulocytes 0 Not Estab. %   Immature Grans (Abs) 0.0 0.0 - 0.1 x10E3/uL  CMP14+EGFR  Result Value Ref Range   Glucose 163 (H) 70 - 99 mg/dL   BUN 15 8 - 27 mg/dL   Creatinine, Ser 1.02 (H) 0.57 - 1.00 mg/dL   eGFR 53 (L) >59 mL/min/1.73   BUN/Creatinine Ratio 15 12 - 28   Sodium 140 134 - 144 mmol/L   Potassium 4.0 3.5 - 5.2 mmol/L   Chloride 103 96 - 106 mmol/L   CO2 22 20 - 29 mmol/L   Calcium 9.5 8.7 - 10.3 mg/dL   Total Protein 6.9 6.0 - 8.5 g/dL   Albumin 4.0 3.7 - 4.7 g/dL   Globulin, Total 2.9 1.5 - 4.5 g/dL   Albumin/Globulin Ratio 1.4 1.2 - 2.2   Bilirubin Total 0.8 0.0 - 1.2 mg/dL   Alkaline Phosphatase 55 44 - 121 IU/L   AST 19 0 - 40 IU/L   ALT 9 0 - 32 IU/L  Lipid panel  Result Value Ref Range   Cholesterol, Total 194 100 - 199 mg/dL   Triglycerides 178 (H) 0 - 149 mg/dL   HDL 48 >39 mg/dL   VLDL Cholesterol Cal 31 5 - 40 mg/dL   LDL Chol Calc (NIH) 115 (H) 0 - 99 mg/dL   Chol/HDL Ratio 4.0 0.0 - 4.4 ratio  Thyroid Panel With TSH  Result Value Ref Range   TSH 0.946 0.450 - 4.500 uIU/mL   T4, Total 8.5 4.5 -  12.0 ug/dL   T3 Uptake Ratio 30 24 - 39 %   Free Thyroxine Index 2.6 1.2 - 4.9  Bayer DCA Hb A1c Waived  Result Value Ref Range   HB A1C (BAYER DCA - WAIVED) 6.3 (H) 4.8 - 5.6 %       Pertinent labs & imaging results that were available during my care of the patient were reviewed by me and considered in my medical decision making.  Assessment & Plan:  Carleigh was seen today for left leg hurts.  Diagnoses and all orders for this visit:  Left leg swelling Pain of left calf Concerning for DVT. On Xarelto, if DVT present, will possibly require admission for anticoagulation as she is already on Xarelto.  -     US Venous Img Lower  Unilateral Left; Future  Cellulitis of left leg Medications as prescribed. Follow up for reevaluation in 7-10 days, sooner if worsening.  -     doxycycline (VIBRA-TABS) 100 MG tablet; Take 1 tablet (100 mg total) by mouth 2 (two) times daily for 10 days. 1 po bid     Continue all other maintenance medications.  Follow up plan: Return in about 8 days (around 09/10/2022), or if symptoms worsen or fail to improve, for cellulitis.   Continue healthy lifestyle choices, including diet (rich in fruits, vegetables, and lean proteins, and low in salt and simple carbohydrates) and exercise (at least 30 minutes of moderate physical activity daily).  Educational handout given for cellulitis   The above assessment and management plan was discussed with the patient. The patient verbalized understanding of and has agreed to the management plan. Patient is aware to call the clinic if they develop any new symptoms or if symptoms persist or worsen. Patient is aware when to return to the clinic for a follow-up visit. Patient educated on when it is appropriate to go to the emergency department.   Monia Pouch, FNP-C [facility] [phone]

## 2022-09-10 ENCOUNTER — Encounter: Payer: Self-pay | Admitting: Family Medicine

## 2022-09-10 ENCOUNTER — Ambulatory Visit (INDEPENDENT_AMBULATORY_CARE_PROVIDER_SITE_OTHER): Payer: Medicare Other | Admitting: Family Medicine

## 2022-09-10 VITALS — BP 117/71 | HR 72 | Temp 97.4°F

## 2022-09-10 DIAGNOSIS — L03116 Cellulitis of left lower limb: Secondary | ICD-10-CM | POA: Diagnosis not present

## 2022-09-10 MED ORDER — DOXYCYCLINE HYCLATE 100 MG PO TABS: 100.0000 mg | ORAL_TABLET | Freq: Two times a day (BID) | ORAL | 0 refills | Status: AC

## 2022-09-10 NOTE — Progress Notes (Signed)
Subjective:  Patient ID: Debra Campbell, female    DOB: August 25, 1932, 88 y.o.   MRN: [id number]  Patient Care Team: Chevis Pretty, FNP as PCP - General (Nurse Practitioner) Leonie Man, MD as PCP - Cardiology (Cardiology) Marti Sleigh, MD as Attending Physician (Gynecology) Latanya Maudlin, MD as Consulting Physician (Orthopedic Surgery) Leonie Man, MD as Consulting Physician (Cardiology)   Chief Complaint:  Cellulitis (Rck left leg )   HPI: Debra Campbell is a 88 y.o. female presenting on 09/10/2022 for Cellulitis (Rck left leg )   Pt presents today for LLE cellulitis follow up. She was initially seen 09/02/2022 for complaint. Leg was significantly swollen, red, and hot to touch. She also had significant calf swelling and a positive Homans sign, [name] was negative for DVT and she was placed on Doxycycline for 10 days. She returns today with great improvement in symptoms. Denies signs of systemic infection. No new or worsening symptoms.    Relevant past medical, surgical, family, and social history reviewed and updated as indicated.  Allergies and medications reviewed and updated. Data reviewed: Chart in Epic.   Past Medical History:  Diagnosis Date   Aortic calcification ([place]) 2013   Atherosclerotic calcifications of the abdominal aorta and branch noted on CT Abd/Pelvis   Arthritis    legs, lower back, hands   Cancer ([place])    endometrial   Diabetes mellitus    Ectopic pregnancy    Eczema    Headache(784.0)    hx of   History of kidney stones    Hyperlipidemia    no meds   Hypertension    Hypothyroidism    Persistent atrial fibrillation ([place]) 09/28/2016   Relatively new Dx: Asymptomatic.  Rate controlled without medication.   SVD (spontaneous vaginal delivery)    x 3   Thyroid disease     Past Surgical History:  Procedure Laterality Date   ABDOMINAL HYSTERECTOMY Bilateral 06/26/2013   Procedure: TOTAL ABDOMINAL HYSTERECTOMY WITH  BILATERAL SALPINGO OOPHERECTOMY WITH PELVIC LYMPHADNECTOMY;  Surgeon: Alvino Chapel, MD;  Location: [facility];  Service: Gynecology;  Laterality: Bilateral;   BACK SURGERY  06/26/2013   ECTOPIC PREGNANCY SURGERY     HERNIA REPAIR     HYSTEROSCOPY WITH D & C N/A 04/26/2013   Procedure: DILATATION AND CURETTAGE /HYSTEROSCOPY;  Surgeon: Woodroe Mode, MD;  Location: [facility];  Service: Gynecology;  Laterality: N/A;   JOINT REPLACEMENT     LAPAROTOMY     for ectopic pregnancy   LAPAROTOMY N/A 06/26/2013   Procedure: EXPLORATORY LAPAROTOMY;  Surgeon: Alvino Chapel, MD;  Location: [facility];  Service: Gynecology;  Laterality: N/A;   lt knee arthroscopy     rt total hip replacement     TONSILLECTOMY     TOTAL KNEE ARTHROPLASTY Left 12/22/2016   Procedure: LEFT TOTAL KNEE ARTHROPLASTY;  Surgeon: Latanya Maudlin, MD;  Location: [facility];  Service: Orthopedics;  Laterality: Left;   TRANSTHORACIC ECHOCARDIOGRAM  09/2018   EF 60-65%. Mild AS.  Mod-Severe PAH 60 mmHg   WISDOM TOOTH EXTRACTION      Social History   Socioeconomic History   Marital status: Married    Spouse name: Not on file   Number of children: 3   Years of education: Not on file   Highest education level: High school graduate  Occupational History   Occupation: Retired    Comment: [employer]  Tobacco Use   Smoking status: Never   Smokeless tobacco: Never  Vaping Use   Vaping Use: Never used  Substance and Sexual Activity   Alcohol use: No   Drug use: No   Sexual activity: Not Currently    Birth control/protection: Post-menopausal  Other Topics Concern   Not on file  Social History Narrative   Not on file   Social Determinants of Health   Financial Resource Strain: Low Risk  (11/18/2020)   Overall Financial Resource Strain (CARDIA)    Difficulty of Paying Living Expenses: Not hard at all  Food Insecurity: No Food Insecurity (11/18/2020)   Hunger Vital Sign    Worried About Running Out of Food in the Last  Year: Never true    Ran Out of Food in the Last Year: Never true  Transportation Needs: No Transportation Needs (11/18/2020)   PRAPARE - Hydrologist (Medical): No    Lack of Transportation (Non-Medical): No  Physical Activity: Inactive (11/18/2020)   Exercise Vital Sign    Days of Exercise per Week: 0 days    Minutes of Exercise per Session: 0 min  Stress: No Stress Concern Present (11/18/2020)   Cricket    Feeling of Stress : Only a little  Social Connections: Moderately Integrated (11/18/2020)   Social Connection and Isolation Panel [NHANES]    Frequency of Communication with Friends and Family: More than three times a week    Frequency of Social Gatherings with Friends and Family: Three times a week    Attends Religious Services: More than 4 times per year    Active Member of Clubs or Organizations: No    Attends Archivist Meetings: Never    Marital Status: Married  Human resources officer Violence: Not At Risk (11/18/2020)   Humiliation, Afraid, Rape, and Kick questionnaire    Fear of Current or Ex-Partner: No    Emotionally Abused: No    Physically Abused: No    Sexually Abused: No    Outpatient Encounter Medications as of 09/10/2022  Medication Sig   Accu-Chek Softclix Lancets lancets CHECK BLOOD SUGAR ONCE DAILY DX E11.42   acetaminophen (TYLENOL) 500 MG tablet Take 1 tablet (500 mg total) by mouth every 8 (eight) hours as needed.   doxycycline (VIBRA-TABS) 100 MG tablet Take 1 tablet (100 mg total) by mouth 2 (two) times daily for 5 days. 1 po bid   furosemide (LASIX) 40 MG tablet Take 2 tablets (80 mg total) by mouth 2 (two) times daily. MAY TAKE AN ADDITIONAL 40 MG IF WEIGHT GAIN OF 3 LBS OR MORE OVER NIGHT   gabapentin (NEURONTIN) 300 MG capsule Take 1 capsule (300 mg total) by mouth 2 (two) times daily.   glucose blood (ACCU-CHEK GUIDE) test strip CHECK BLOOD SUGAR  ONCE DAILY DX E11.42   levothyroxine (SYNTHROID) 125 MCG tablet Take 1 tablet (125 mcg total) by mouth daily.   metFORMIN (GLUCOPHAGE) 500 MG tablet Take 1 tablet (500 mg total) by mouth 2 (two) times daily with a meal.   metoprolol succinate (TOPROL-XL) 25 MG 24 hr tablet Take 1 tablet (25 mg total) by mouth daily.   rivaroxaban (XARELTO) 20 MG TABS tablet TAKE 1 TABLET BY MOUTH EVERY DAY WITH BREAKFAST   [DISCONTINUED] doxycycline (VIBRA-TABS) 100 MG tablet Take 1 tablet (100 mg total) by mouth 2 (two) times daily for 10 days. 1 po bid   No facility-administered encounter medications on file as of 09/10/2022.    Allergies  Allergen Reactions  Ace Inhibitors Cough   Iohexol      Desc: HIVES 40 YEARS AGO    Statins     myalgia   Penicillins Swelling and Rash    Has patient had a PCN reaction causing immediate rash, facial/tongue/throat swelling, SOB or lightheadedness with hypotension: Yes Has patient had a PCN reaction causing severe rash involving mucus membranes or skin necrosis: No Has patient had a PCN reaction that required hospitalization Yes Has patient had a PCN reaction occurring within the last 10 years: No If all of the above answers are "NO", then may proceed with Cephalosporin use.    Sulfa Antibiotics Rash    Review of Systems  Constitutional:  Negative for activity change, appetite change, chills, diaphoresis, fatigue, fever and unexpected weight change.  HENT: Negative.    Eyes: Negative.   Respiratory:  Negative for cough, chest tightness and shortness of breath.   Cardiovascular:  Positive for leg swelling. Negative for chest pain and palpitations.  Gastrointestinal:  Negative for blood in stool, constipation, diarrhea, nausea and vomiting.  Endocrine: Negative.   Genitourinary:  Negative for decreased urine volume, difficulty urinating, dysuria, frequency and urgency.  Musculoskeletal:  Positive for gait problem. Negative for arthralgias and myalgias.  Skin:   Positive for color change and wound. Negative for pallor and rash.  Allergic/Immunologic: Negative.   Neurological:  Negative for dizziness, weakness and headaches.  Hematological: Negative.   Psychiatric/Behavioral:  Negative for agitation, confusion, hallucinations, sleep disturbance and suicidal ideas.   All other systems reviewed and are negative.       Objective:  BP 117/71   Pulse 72   Temp (!) 97.4 F (36.3 C) (Temporal)   SpO2 95%    Wt Readings from Last 3 Encounters:  09/02/22 199 lb (90.3 kg)  07/19/22 199 lb 12.8 oz (90.6 kg)  06/07/22 203 lb 6 oz (92.3 kg)    Physical Exam Vitals and nursing note reviewed.  Constitutional:      General: She is not in acute distress.    Appearance: Normal appearance. She is obese. She is not ill-appearing, toxic-appearing or diaphoretic.  HENT:     Head: Normocephalic and atraumatic.  Eyes:     Pupils: Pupils are equal, round, and reactive to light.  Cardiovascular:     Rate and Rhythm: Normal rate. Rhythm irregularly irregular.     Heart sounds: Murmur heard.  Pulmonary:     Effort: Pulmonary effort is normal.     Breath sounds: Normal breath sounds.  Musculoskeletal:     Right lower leg: Edema present.     Left lower leg: Edema present.  Skin:    General: Skin is warm and dry.     Capillary Refill: Capillary refill takes less than 2 seconds.       Neurological:     General: No focal deficit present.     Mental Status: She is alert and oriented to person, place, and time.     Gait: Gait abnormal (in wheelchair).  Psychiatric:        Mood and Affect: Mood normal.        Behavior: Behavior normal.        Thought Content: Thought content normal.        Judgment: Judgment normal.     Results for orders placed or performed in visit on 07/19/22  CBC with Differential/Platelet  Result Value Ref Range   WBC 4.7 3.4 - 10.8 x10E3/uL   RBC 4.10 3.77 - 5.28 x10E6/uL  Hemoglobin 13.0 11.1 - 15.9 g/dL   Hematocrit 39.1  34.0 - 46.6 %   MCV 95 79 - 97 fL   MCH 31.7 26.6 - 33.0 pg   MCHC 33.2 31.5 - 35.7 g/dL   RDW 13.1 11.7 - 15.4 %   Platelets 171 150 - 450 x10E3/uL   Neutrophils 55 Not Estab. %   Lymphs 30 Not Estab. %   Monocytes 9 Not Estab. %   Eos 5 Not Estab. %   Basos 1 Not Estab. %   Neutrophils Absolute 2.6 1.4 - 7.0 x10E3/uL   Lymphocytes Absolute 1.4 0.7 - 3.1 x10E3/uL   Monocytes Absolute 0.4 0.1 - 0.9 x10E3/uL   EOS (ABSOLUTE) 0.2 0.0 - 0.4 x10E3/uL   Basophils Absolute 0.1 0.0 - 0.2 x10E3/uL   Immature Granulocytes 0 Not Estab. %   Immature Grans (Abs) 0.0 0.0 - 0.1 x10E3/uL  CMP14+EGFR  Result Value Ref Range   Glucose 163 (H) 70 - 99 mg/dL   BUN 15 8 - 27 mg/dL   Creatinine, Ser 1.02 (H) 0.57 - 1.00 mg/dL   eGFR 53 (L) >59 mL/min/1.73   BUN/Creatinine Ratio 15 12 - 28   Sodium 140 134 - 144 mmol/L   Potassium 4.0 3.5 - 5.2 mmol/L   Chloride 103 96 - 106 mmol/L   CO2 22 20 - 29 mmol/L   Calcium 9.5 8.7 - 10.3 mg/dL   Total Protein 6.9 6.0 - 8.5 g/dL   Albumin 4.0 3.7 - 4.7 g/dL   Globulin, Total 2.9 1.5 - 4.5 g/dL   Albumin/Globulin Ratio 1.4 1.2 - 2.2   Bilirubin Total 0.8 0.0 - 1.2 mg/dL   Alkaline Phosphatase 55 44 - 121 IU/L   AST 19 0 - 40 IU/L   ALT 9 0 - 32 IU/L  Lipid panel  Result Value Ref Range   Cholesterol, Total 194 100 - 199 mg/dL   Triglycerides 178 (H) 0 - 149 mg/dL   HDL 48 >39 mg/dL   VLDL Cholesterol Cal 31 5 - 40 mg/dL   LDL Chol Calc (NIH) 115 (H) 0 - 99 mg/dL   Chol/HDL Ratio 4.0 0.0 - 4.4 ratio  Thyroid Panel With TSH  Result Value Ref Range   TSH 0.946 0.450 - 4.500 uIU/mL   T4, Total 8.5 4.5 - 12.0 ug/dL   T3 Uptake Ratio 30 24 - 39 %   Free Thyroxine Index 2.6 1.2 - 4.9  Bayer DCA Hb A1c Waived  Result Value Ref Range   HB A1C (BAYER DCA - WAIVED) 6.3 (H) 4.8 - 5.6 %       Pertinent labs & imaging results that were available during my care of the patient were reviewed by me and considered in my medical decision making.  Assessment  & Plan:  Debra Campbell was seen today for cellulitis.  Diagnoses and all orders for this visit:  Cellulitis of left leg Improved greatly but still not resolved. Will treat with 5 more days of antibiotics. Pt aware to report new, worsening, or persistent symptoms. -     doxycycline (VIBRA-TABS) 100 MG tablet; Take 1 tablet (100 mg total) by mouth 2 (two) times daily for 5 days. 1 po bid     Continue all other maintenance medications.  Follow up plan: Return if symptoms worsen or fail to improve.   Continue healthy lifestyle choices, including diet (rich in fruits, vegetables, and lean proteins, and low in salt and simple carbohydrates) and exercise (at least 30 minutes  of moderate physical activity daily).  Educational handout given for cellulitis  The above assessment and management plan was discussed with the patient. The patient verbalized understanding of and has agreed to the management plan. Patient is aware to call the clinic if they develop any new symptoms or if symptoms persist or worsen. Patient is aware when to return to the clinic for a follow-up visit. Patient educated on when it is appropriate to go to the emergency department.   Monia Pouch, FNP-C [facility] [phone]

## 2022-10-19 ENCOUNTER — Encounter: Payer: Self-pay | Admitting: Nurse Practitioner

## 2022-10-19 ENCOUNTER — Ambulatory Visit (INDEPENDENT_AMBULATORY_CARE_PROVIDER_SITE_OTHER): Payer: Medicare Other | Admitting: Nurse Practitioner

## 2022-10-19 VITALS — BP 136/87 | HR 96 | Temp 97.4°F | Resp 20 | Ht 65.0 in | Wt 196.0 lb

## 2022-10-19 DIAGNOSIS — M8588 Other specified disorders of bone density and structure, other site: Secondary | ICD-10-CM | POA: Diagnosis not present

## 2022-10-19 DIAGNOSIS — I4821 Permanent atrial fibrillation: Secondary | ICD-10-CM | POA: Diagnosis not present

## 2022-10-19 DIAGNOSIS — E785 Hyperlipidemia, unspecified: Secondary | ICD-10-CM

## 2022-10-19 DIAGNOSIS — I152 Hypertension secondary to endocrine disorders: Secondary | ICD-10-CM

## 2022-10-19 DIAGNOSIS — E1142 Type 2 diabetes mellitus with diabetic polyneuropathy: Secondary | ICD-10-CM

## 2022-10-19 DIAGNOSIS — B372 Candidiasis of skin and nail: Secondary | ICD-10-CM

## 2022-10-19 DIAGNOSIS — E1169 Type 2 diabetes mellitus with other specified complication: Secondary | ICD-10-CM

## 2022-10-19 DIAGNOSIS — Z23 Encounter for immunization: Secondary | ICD-10-CM

## 2022-10-19 DIAGNOSIS — E1159 Type 2 diabetes mellitus with other circulatory complications: Secondary | ICD-10-CM | POA: Diagnosis not present

## 2022-10-19 DIAGNOSIS — R609 Edema, unspecified: Secondary | ICD-10-CM

## 2022-10-19 DIAGNOSIS — E034 Atrophy of thyroid (acquired): Secondary | ICD-10-CM

## 2022-10-19 LAB — BAYER DCA HB A1C WAIVED: HB A1C (BAYER DCA - WAIVED): 6.5 % — ABNORMAL HIGH (ref 4.8–5.6)

## 2022-10-19 MED ORDER — NYSTATIN 100000 UNIT/GM EX CREA: 1.0000 | TOPICAL_CREAM | Freq: Two times a day (BID) | CUTANEOUS | 1 refills | Status: DC

## 2022-10-19 NOTE — Progress Notes (Signed)
Subjective:    Patient ID: NIKIE CID, female    DOB: 1931-12-21, 88 y.o.   MRN: [id number]   Chief Complaint: medical management of chronic issues     HPI:  Debra Campbell is a 88 y.o. who identifies as a female who was assigned female at birth.   Social history: Lives with: husband Work history: retired   Scientist, forensic in today for follow up of the following chronic medical issues:  1. Type 2 diabetes mellitus with diabetic polyneuropathy, without long-term current use of insulin (HCC) Very seldom checks her blood sugars at home. Lab Results  Component Value Date   HGBA1C 6.3 (H) 07/19/2022     2. Hypertension associated with type 2 diabetes mellitus ([place]) No c/o chest pain, sob or headache. Does not check blood pressure at home. BP Readings from Last 3 Encounters:  09/10/22 117/71  09/02/22 122/70  07/19/22 128/71     3. Hyperlipidemia associated with type 2 diabetes mellitus ([place]) Does not watch diet and does no dedicated exercise. Lab Results  Component Value Date   CHOL 194 07/19/2022   HDL 48 07/19/2022   LDLCALC 115 (H) 07/19/2022   TRIG 178 (H) 07/19/2022   CHOLHDL 4.0 07/19/2022     4. Permanent atrial fibrillation ([place]) CHA2DS2Vasc = 5; Xarelto Denies palpitations heart racing. No bleeding issues on xeralto  5. Diabetic polyneuropathy associated with type 2 diabetes mellitus (HCC) Has occasional numbness in bil feet. Is on neurontin which seems to help.  6. Hypothyroidism due to acquired atrophy of thyroid No issues that she  is aware of Lab Results  Component Value Date   TSH 0.946 07/19/2022     7. Osteopenia of lumbar spine Last dexascan was dine n 01/15/21. T score was -2.0. does no weight bearing exercises   8. Peripheral edema Has edema daily. Elevating legs helps  9. Morbid obesity ([place]) No recent weight changes Wt Readings from Last 3 Encounters:  09/02/22 199 lb (90.3 kg)  07/19/22 199 lb 12.8 oz (90.6 kg)  06/07/22 203 lb 6  oz (92.3 kg)   BMI Readings from Last 3 Encounters:  09/02/22 33.12 kg/m  07/19/22 33.25 kg/m  06/07/22 33.84 kg/m      New complaints: Erythematous rash under bil breasts- has been there for several months. Has been using gold bond.  Allergies  Allergen Reactions   Ace Inhibitors Cough   Iohexol      Desc: HIVES 40 YEARS AGO    Statins     myalgia   Penicillins Swelling and Rash    Has patient had a PCN reaction causing immediate rash, facial/tongue/throat swelling, SOB or lightheadedness with hypotension: Yes Has patient had a PCN reaction causing severe rash involving mucus membranes or skin necrosis: No Has patient had a PCN reaction that required hospitalization Yes Has patient had a PCN reaction occurring within the last 10 years: No If all of the above answers are "NO", then may proceed with Cephalosporin use.    Sulfa Antibiotics Rash   Outpatient Encounter Medications as of 10/19/2022  Medication Sig   Accu-Chek Softclix Lancets lancets CHECK BLOOD SUGAR ONCE DAILY DX E11.42   acetaminophen (TYLENOL) 500 MG tablet Take 1 tablet (500 mg total) by mouth every 8 (eight) hours as needed.   furosemide (LASIX) 40 MG tablet Take 2 tablets (80 mg total) by mouth 2 (two) times daily. MAY TAKE AN ADDITIONAL 40 MG IF WEIGHT GAIN OF 3 LBS OR MORE OVER NIGHT  gabapentin (NEURONTIN) 300 MG capsule Take 1 capsule (300 mg total) by mouth 2 (two) times daily.   glucose blood (ACCU-CHEK GUIDE) test strip CHECK BLOOD SUGAR ONCE DAILY DX E11.42   levothyroxine (SYNTHROID) 125 MCG tablet Take 1 tablet (125 mcg total) by mouth daily.   metFORMIN (GLUCOPHAGE) 500 MG tablet Take 1 tablet (500 mg total) by mouth 2 (two) times daily with a meal.   metoprolol succinate (TOPROL-XL) 25 MG 24 hr tablet Take 1 tablet (25 mg total) by mouth daily.   rivaroxaban (XARELTO) 20 MG TABS tablet TAKE 1 TABLET BY MOUTH EVERY DAY WITH BREAKFAST   No facility-administered encounter medications on file  as of 10/19/2022.    Past Surgical History:  Procedure Laterality Date   ABDOMINAL HYSTERECTOMY Bilateral 06/26/2013   Procedure: TOTAL ABDOMINAL HYSTERECTOMY WITH BILATERAL SALPINGO OOPHERECTOMY WITH PELVIC LYMPHADNECTOMY;  Surgeon: Alvino Chapel, MD;  Location: [facility];  Service: Gynecology;  Laterality: Bilateral;   BACK SURGERY  06/26/2013   ECTOPIC PREGNANCY SURGERY     HERNIA REPAIR     HYSTEROSCOPY WITH D & C N/A 04/26/2013   Procedure: DILATATION AND CURETTAGE /HYSTEROSCOPY;  Surgeon: Woodroe Mode, MD;  Location: [facility];  Service: Gynecology;  Laterality: N/A;   JOINT REPLACEMENT     LAPAROTOMY     for ectopic pregnancy   LAPAROTOMY N/A 06/26/2013   Procedure: EXPLORATORY LAPAROTOMY;  Surgeon: Alvino Chapel, MD;  Location: [facility];  Service: Gynecology;  Laterality: N/A;   lt knee arthroscopy     rt total hip replacement     TONSILLECTOMY     TOTAL KNEE ARTHROPLASTY Left 12/22/2016   Procedure: LEFT TOTAL KNEE ARTHROPLASTY;  Surgeon: Latanya Maudlin, MD;  Location: [facility];  Service: Orthopedics;  Laterality: Left;   TRANSTHORACIC ECHOCARDIOGRAM  09/2018   EF 60-65%. Mild AS.  Mod-Severe PAH 60 mmHg   WISDOM TOOTH EXTRACTION      Family History  Problem Relation Age of Onset   Heart disease Father    Thyroid disease Father    Heart attack Father    Heart disease Sister        open heart surgery   Cancer Sister        breast   Neuropathy Sister        secondary to cancer treatment   Breast cancer Sister    Suicidality Brother 25   Arthritis Mother    Cancer Daughter    Breast cancer Daughter       Controlled substance contract: n/a     Review of Systems  Constitutional:  Negative for diaphoresis.  Eyes:  Negative for pain.  Respiratory:  Negative for shortness of breath.   Cardiovascular:  Negative for chest pain, palpitations and leg swelling.  Gastrointestinal:  Negative for abdominal pain.  Endocrine: Negative for polydipsia.  Skin:   Negative for rash.  Neurological:  Negative for dizziness, weakness and headaches.  Hematological:  Does not bruise/bleed easily.  All other systems reviewed and are negative.      Objective:   Physical Exam Vitals and nursing note reviewed.  Constitutional:      General: She is not in acute distress.    Appearance: Normal appearance. She is well-developed.  HENT:     Head: Normocephalic.     Right Ear: Tympanic membrane normal.     Left Ear: Tympanic membrane normal.     Nose: Nose normal.     Mouth/Throat:     Mouth: Mucous  membranes are moist.  Eyes:     Pupils: Pupils are equal, round, and reactive to light.  Neck:     Vascular: No carotid bruit or JVD.  Cardiovascular:     Rate and Rhythm: Normal rate.     Heart sounds: Normal heart sounds.  Pulmonary:     Effort: Pulmonary effort is normal. No respiratory distress.     Breath sounds: Normal breath sounds. No wheezing or rales.  Chest:     Chest wall: No tenderness.  Abdominal:     General: Bowel sounds are normal. There is no distension or abdominal bruit.     Palpations: Abdomen is soft. There is no hepatomegaly, splenomegaly, mass or pulsatile mass.     Tenderness: There is no abdominal tenderness.  Musculoskeletal:        General: Normal range of motion.     Cervical back: Normal range of motion and neck supple.     Right lower leg: Edema (1+) present.     Left lower leg: Edema (1+) present.  Lymphadenopathy:     Cervical: No cervical adenopathy.  Skin:    General: Skin is warm and dry.  Neurological:     Mental Status: She is alert and oriented to person, place, and time.     Deep Tendon Reflexes: Reflexes are normal and symmetric.  Psychiatric:        Behavior: Behavior normal.        Thought Content: Thought content normal.        Judgment: Judgment normal.      BP 136/87   Pulse 96   Temp (!) 97.4 F (36.3 C) (Temporal)   Resp 20   Ht _0  (1.651 m)   Wt 196 lb (88.9 kg)   SpO2 98%   BMI  32.62 kg/m   HGBA1c 6.5%     Assessment & Plan:   TISHINA LOWN comes in today with chief complaint of Medical Management of Chronic Issues   Diagnosis and orders addressed:  1. Type 2 diabetes mellitus with diabetic polyneuropathy, without long-term current use of insulin (HCC) Continue to watch carbs in diet - Bayer DCA Hb A1c Waived - Microalbumin / creatinine urine ratio  2. Hypertension associated with type 2 diabetes mellitus (HCC) Low sodium diet - CBC with Differential/Platelet - CMP14+EGFR  3. Hyperlipidemia associated with type 2 diabetes mellitus (HCC) Low fat diet - Lipid panel  4. Permanent atrial fibrillation ([place]) CHA2DS2Vasc = 5; Xarelto Report if heart is racing  5. Diabetic polyneuropathy associated with type 2 diabetes mellitus ([place]) Do not go bare footed  6. Hypothyroidism due to acquired atrophy of thyroid Labs pending  7. Osteopenia of lumbar spine Weight bearing exercises when can tolerate  8. Peripheral edema Elevate legs when sitting  9. Morbid obesity ([place]) Discussed diet and exercise for person with BMI >25 Will recheck weight in 3-6 months   10. Cutaneous candidiasis Keep area as dry as possible - nystatin cream (MYCOSTATIN); Apply 1 Application topically 2 (two) times daily.  Dispense: 30 g; Refill: 1   Labs pending Health Maintenance reviewed Diet and exercise encouraged  Follow up plan: 3 months   [name] Hassell Done, FNP

## 2022-10-19 NOTE — Patient Instructions (Signed)
Skin Yeast Infection  A skin yeast infection is a condition in which there is an overgrowth of yeast (Candida) that normally lives on the skin. This condition usually occurs in areas of the skin that are constantly warm and moist, such as the skin under the breasts or armpits, or in the groin and other body folds. What are the causes? This condition is caused by a change in the normal balance of the yeast that live on the skin. What increases the risk? You are more likely to develop this condition if you: Are obese. Are pregnant. Are 86 years of age or older. Wear tight clothing. Have any of the following conditions: Diabetes. Malnutrition. A weak body defense system (immune system). Take medicines such as: Birth control pills. Antibiotics. Steroid medicines. What are the signs or symptoms? The most common symptom of this condition is itchiness in the affected area. Other symptoms include: A red, swollen area of the skin. Bumps on the skin. How is this diagnosed? This condition is diagnosed with a medical history and physical exam. Your health care provider may check for yeast by taking scrapings of the skin to be viewed under a microscope. How is this treated? This condition is treated with medicine. Medicines may be prescribed or available over the counter. The medicines may be: Taken by mouth (orally). Applied as a cream or powder to your skin. Follow these instructions at home:  Take or apply over-the-counter and prescription medicines only as told by your health care provider. Maintain a healthy weight. If you need help losing weight, talk with your health care provider. Keep your skin clean and dry. Wear loose-fitting clothing. If you have diabetes, keep your blood sugar under control. Keep all follow-up visits. This is important. Contact a health care provider if: Your symptoms go away and then come back. Your symptoms do not get better with treatment. Your symptoms get  worse. Your rash spreads. You have a fever or chills. You have new symptoms. You have new warmth or redness of your skin. Your rash is painful or bleeding. Summary A skin yeast infection is a condition in which there is an overgrowth of yeast (Candida) that normally lives on the skin. Take or apply over-the-counter and prescription medicines only as told by your health care provider. Keep your skin clean and dry. Contact a health care provider if your symptoms do not get better with treatment. This information is not intended to replace advice given to you by your health care provider. Make sure you discuss any questions you have with your health care provider. Document Revised: 02/03/2021 Document Reviewed: 02/03/2021 Elsevier Patient Education  2023 Elsevier Inc.  

## 2022-10-20 LAB — CBC WITH DIFFERENTIAL/PLATELET
Basophils Absolute: 0.1 10*3/uL (ref 0.0–0.2)
Basos: 1 %
EOS (ABSOLUTE): 0.3 10*3/uL (ref 0.0–0.4)
Eos: 5 %
Hematocrit: 39.9 % (ref 34.0–46.6)
Hemoglobin: 13.8 g/dL (ref 11.1–15.9)
Immature Grans (Abs): 0 10*3/uL (ref 0.0–0.1)
Immature Granulocytes: 0 %
Lymphocytes Absolute: 1.6 10*3/uL (ref 0.7–3.1)
Lymphs: 29 %
MCH: 32.5 pg (ref 26.6–33.0)
MCHC: 34.6 g/dL (ref 31.5–35.7)
MCV: 94 fL (ref 79–97)
Monocytes Absolute: 0.5 10*3/uL (ref 0.1–0.9)
Monocytes: 10 %
Neutrophils Absolute: 2.9 10*3/uL (ref 1.4–7.0)
Neutrophils: 55 %
Platelets: 180 10*3/uL (ref 150–450)
RBC: 4.24 x10E6/uL (ref 3.77–5.28)
RDW: 13.1 % (ref 11.7–15.4)
WBC: 5.4 10*3/uL (ref 3.4–10.8)

## 2022-10-20 LAB — CMP14+EGFR
ALT: 20 IU/L (ref 0–32)
AST: 29 IU/L (ref 0–40)
Albumin/Globulin Ratio: 1.4 (ref 1.2–2.2)
Albumin: 4.2 g/dL (ref 3.7–4.7)
Alkaline Phosphatase: 55 IU/L (ref 44–121)
BUN/Creatinine Ratio: 20 (ref 12–28)
BUN: 20 mg/dL (ref 8–27)
Bilirubin Total: 0.8 mg/dL (ref 0.0–1.2)
CO2: 24 mmol/L (ref 20–29)
Calcium: 9.6 mg/dL (ref 8.7–10.3)
Chloride: 103 mmol/L (ref 96–106)
Creatinine, Ser: 1 mg/dL (ref 0.57–1.00)
Globulin, Total: 3.1 g/dL (ref 1.5–4.5)
Glucose: 154 mg/dL — ABNORMAL HIGH (ref 70–99)
Potassium: 4.1 mmol/L (ref 3.5–5.2)
Sodium: 142 mmol/L (ref 134–144)
Total Protein: 7.3 g/dL (ref 6.0–8.5)
eGFR: 54 mL/min/{1.73_m2} — ABNORMAL LOW (ref >59)

## 2022-10-20 LAB — LIPID PANEL
Chol/HDL Ratio: 3.3 ratio (ref 0.0–4.4)
Cholesterol, Total: 202 mg/dL — ABNORMAL HIGH (ref 100–199)
HDL: 62 mg/dL (ref >39)
LDL Chol Calc (NIH): 117 mg/dL — ABNORMAL HIGH (ref 0–99)
Triglycerides: 130 mg/dL (ref 0–149)
VLDL Cholesterol Cal: 23 mg/dL (ref 5–40)

## 2022-10-20 LAB — MICROALBUMIN / CREATININE URINE RATIO
Creatinine, Urine: 170.6 mg/dL
Microalb/Creat Ratio: 11 mg/g creat (ref 0–29)
Microalbumin, Urine: 18.1 ug/mL

## 2022-10-25 ENCOUNTER — Telehealth: Payer: Self-pay | Admitting: Nurse Practitioner

## 2022-10-25 DIAGNOSIS — E1142 Type 2 diabetes mellitus with diabetic polyneuropathy: Secondary | ICD-10-CM

## 2022-10-25 MED ORDER — METFORMIN HCL 500 MG PO TABS: 500.0000 mg | ORAL_TABLET | Freq: Two times a day (BID) | ORAL | 0 refills | Status: DC

## 2022-10-25 NOTE — Telephone Encounter (Signed)
Refill sent to pharmacy, patient aware ?

## 2022-10-25 NOTE — Telephone Encounter (Signed)
  Prescription Request  10/25/2022  Is this a "Controlled Substance" medicine?   Have you seen your PCP in the last 2 weeks? Has appt 11/21  If YES, route message to pool  -  If NO, patient needs to be scheduled for appointment.  What is the name of the medication or equipment? metFORMIN (GLUCOPHAGE) 500 MG tablet   Have you contacted your pharmacy to request a refill? yes   Which pharmacy would you like this sent to?  [facility] [place], [place] to Registered [facility] Sites       Patient notified that their request is being sent to the clinical staff for review and that they should receive a response within 2 business days.

## 2022-11-30 ENCOUNTER — Ambulatory Visit (INDEPENDENT_AMBULATORY_CARE_PROVIDER_SITE_OTHER): Payer: Medicare Other

## 2022-11-30 VITALS — Ht 65.0 in | Wt 196.0 lb

## 2022-11-30 DIAGNOSIS — Z0000 Encounter for general adult medical examination without abnormal findings: Secondary | ICD-10-CM | POA: Diagnosis not present

## 2022-11-30 NOTE — Progress Notes (Signed)
Subjective:   Debra Campbell is a 89 y.o. female who presents for Medicare Annual (Subsequent) preventive examination. I connected with  LAYNA ROEPER on 11/30/22 by a audio enabled telemedicine application and verified that I am speaking with the correct person using two identifiers.  Patient Location: Home  Provider Location: Home Office  I discussed the limitations of evaluation and management by telemedicine. The patient expressed understanding and agreed to proceed.  Review of Systems     Cardiac Risk Factors include: advanced age (>20mn, >>89women);diabetes mellitus;hypertension;dyslipidemia     Objective:    Today's Vitals   11/30/22 0947  Weight: 196 lb (88.9 kg)  Height: '5\' 5"'$  (1.651 m)   Body mass index is 32.62 kg/m.     11/30/2022    9:52 AM 11/28/2021   10:43 PM 11/27/2021   10:08 AM 11/18/2020   10:53 AM 11/14/2019   10:45 AM 10/30/2018   10:42 AM 10/09/2018    2:53 PM  Advanced Directives  Does Patient Have a Medical Advance Directive? Yes Yes Yes No No Yes No  Type of AParamedicof AHelena Valley West CentralLiving will H[place]arpsburgLiving will H[place]Living will   [place] will   Does patient want to make changes to medical advance directive?  No - Patient declined No - Patient declined   No - Patient declined   Copy of [place] Chart? No - copy requested     No - copy requested   Would patient like information on creating a medical advance directive?    No - Patient declined No - Patient declined  No - Patient declined    Current Medications (verified) Outpatient Encounter Medications as of 11/30/2022  Medication Sig   Accu-Chek Softclix Lancets lancets CHECK BLOOD SUGAR ONCE DAILY DX E11.42   acetaminophen (TYLENOL) 500 MG tablet Take 1 tablet (500 mg total) by mouth every 8 (eight) hours as needed.   furosemide (LASIX) 40 MG tablet Take 2 tablets (80 mg  total) by mouth 2 (two) times daily. MAY TAKE AN ADDITIONAL 40 MG IF WEIGHT GAIN OF 3 LBS OR MORE OVER NIGHT   gabapentin (NEURONTIN) 300 MG capsule Take 1 capsule (300 mg total) by mouth 2 (two) times daily.   glucose blood (ACCU-CHEK GUIDE) test strip CHECK BLOOD SUGAR ONCE DAILY DX E11.42   levothyroxine (SYNTHROID) 125 MCG tablet Take 1 tablet (125 mcg total) by mouth daily.   metFORMIN (GLUCOPHAGE) 500 MG tablet Take 1 tablet (500 mg total) by mouth 2 (two) times daily with a meal.   metoprolol succinate (TOPROL-XL) 25 MG 24 hr tablet Take 1 tablet (25 mg total) by mouth daily.   nystatin cream (MYCOSTATIN) Apply 1 Application topically 2 (two) times daily.   rivaroxaban (XARELTO) 20 MG TABS tablet TAKE 1 TABLET BY MOUTH EVERY DAY WITH BREAKFAST   No facility-administered encounter medications on file as of 11/30/2022.    Allergies (verified) Ace inhibitors, Iohexol, Statins, Penicillins, and Sulfa antibiotics   History: Past Medical History:  Diagnosis Date   [place] calc[place]   Atherosclerotic calcifications of the abdominal aorta and branch noted on CT Abd/Pelvis   Arthritis    legs, lower back, hands   Cancer (HCC)    endometrial   Diabetes mellitus    Ectopic pregnancy    Eczema    Headache(784.0)    hx of   History of kidney stones    Hyperlipidemia  no meds   Hypertension    Hypothyroidism    Persistent atrial fibrillation ([place]) 09/28/2016   Relatively new Dx: Asymptomatic.  Rate controlled without medication.   SVD (spontaneous vaginal delivery)    x 3   Thyroid disease    Past Surgical History:  Procedure Laterality Date   ABDOMINAL HYSTERECTOMY Bilateral 06/26/2013   Procedure: TOTAL ABDOMINAL HYSTERECTOMY WITH BILATERAL SALPINGO OOPHERECTOMY WITH PELVIC LYMPHADNECTOMY;  Surgeon: Alvino Chapel, MD;  Location: [facility];  Service: Gynecology;  Laterality: Bilateral;   BACK SURGERY  06/26/2013   ECTOPIC PREGNANCY SURGERY     HERNIA REPAIR      HYSTEROSCOPY WITH D & C N/A 04/26/2013   Procedure: DILATATION AND CURETTAGE /HYSTEROSCOPY;  Surgeon: Woodroe Mode, MD;  Location: [facility];  Service: Gynecology;  Laterality: N/A;   JOINT REPLACEMENT     LAPAROTOMY     for ectopic pregnancy   LAPAROTOMY N/A 06/26/2013   Procedure: EXPLORATORY LAPAROTOMY;  Surgeon: Alvino Chapel, MD;  Location: [facility];  Service: Gynecology;  Laterality: N/A;   lt knee arthroscopy     rt total hip replacement     TONSILLECTOMY     TOTAL KNEE ARTHROPLASTY Left 12/22/2016   Procedure: LEFT TOTAL KNEE ARTHROPLASTY;  Surgeon: Latanya Maudlin, MD;  Location: [facility];  Service: Orthopedics;  Laterality: Left;   TRANSTHORACIC ECHOCARDIOGRAM  09/2018   EF 60-65%. Mild AS.  Mod-Severe PAH 60 mmHg   WISDOM TOOTH EXTRACTION     Family History  Problem Relation Age of Onset   Heart disease Father    Thyroid disease Father    Heart attack Father    Heart disease Sister        open heart surgery   Cancer Sister        breast   Neuropathy Sister        secondary to cancer treatment   Breast cancer Sister    Suicidality Brother 67   Arthritis Mother    Cancer Daughter    Breast cancer Daughter    Social History   Socioeconomic History   Marital status: Married    Spouse name: Not on file   Number of children: 3   Years of education: Not on file   Highest education level: High school graduate  Occupational History   Occupation: Retired    Comment: [employer]  Tobacco Use   Smoking status: Never   Smokeless tobacco: Never  Vaping Use   Vaping Use: Never used  Substance and Sexual Activity   Alcohol use: No   Drug use: No   Sexual activity: Not Currently    Birth control/protection: Post-menopausal  Other Topics Concern   Not on file  Social History Narrative   Not on file   Social Determinants of Health   Financial Resource Strain: Low Risk  (11/30/2022)   Overall Financial Resource Strain (CARDIA)    Difficulty of Paying Living  Expenses: Not hard at all  Food Insecurity: No Food Insecurity (11/30/2022)   Hunger Vital Sign    Worried About Running Out of Food in the Last Year: Never true    [place] in the Last Year: Never true  Transportation Needs: No Transportation Needs (11/30/2022)   PRAPARE - Hydrologist (Medical): No    Lack of Transportation (Non-Medical): No  Physical Activity: Insufficiently Active (11/30/2022)   Exercise Vital Sign    Days of Exercise per Week: 3 days  Minutes of Exercise per Session: 30 min  Stress: No Stress Concern Present (11/30/2022)   [place]    Feeling of Stress : Not at all  Social Connections: Moderately Isolated (11/30/2022)   Social Connection and Isolation Panel [NHANES]    Frequency of Communication with Friends and Family: More than three times a week    Frequency of Social Gatherings with Friends and Family: More than three times a week    Attends Religious Services: Never    Marine scientist or Organizations: No    Attends Music therapist: Never    Marital Status: Married    Tobacco Counseling Counseling given: Not Answered   Clinical Intake:  Pre-visit preparation completed: Yes  Pain : No/denies pain     Nutritional Risks: None Diabetes: Yes CBG done?: No Did pt. bring in CBG monitor from home?: No  How often do you need to have someone help you when you read instructions, pamphlets, or other written materials from your doctor or pharmacy?: 1 - Never  Diabetic?yes Nutrition Risk Assessment:  Has the patient had any N/V/D within the last 2 months?  No  Does the patient have any non-healing wounds?  No  Has the patient had any unintentional weight loss or weight gain?  No   Diabetes:  Is the patient diabetic?  Yes  If diabetic, was a CBG obtained today?  No  Did the patient bring in their glucometer from home?  No  How often  do you monitor your CBG's? weekly.   Financial Strains and Diabetes Management:  Are you having any financial strains with the device, your supplies or your medication? No .  Does the patient want to be seen by Chronic Care Management for management of their diabetes?  No  Would the patient like to be referred to a Nutritionist or for Diabetic Management?  No   Diabetic Exams:  Diabetic Eye Exam: Overdue for diabetic eye exam. Pt has been advised about the importance in completing this exam. Patient advised to call and schedule an eye exam. Diabetic Foot Exam: Overdue, Pt has been advised about the importance in completing this exam. Pt is scheduled for diabetic foot exam on next office visit .     Information entered by :: Jadene Pierini, LPN   Activities of Daily Living    11/30/2022    9:52 AM  In your present state of health, do you have any difficulty performing the following activities:  Hearing? 0  Vision? 0  Difficulty concentrating or making decisions? 0  Walking or climbing stairs? 1  Dressing or bathing? 0  Doing errands, shopping? 1  Preparing Food and eating ? N  Using the Toilet? N  In the past six months, have you accidently leaked urine? N  Do you have problems with loss of bowel control? N  Managing your Medications? N  Managing your Finances? N  Housekeeping or managing your Housekeeping? Y    Patient Care Team: Chevis Pretty, FNP as PCP - General (Nurse Practitioner) Leonie Man, MD as PCP - Cardiology (Cardiology) Marti Sleigh, MD as Attending Physician (Gynecology) Latanya Maudlin, MD as Consulting Physician (Orthopedic Surgery) Leonie Man, MD as Consulting Physician (Cardiology)  Indicate any recent Medical Services you may have received from other than [facility] providers in the past year (date may be approximate).     Assessment:   This is a routine wellness examination for [place].  Hearing/Vision screen Vision Screening  - Comments:: Patient to schedule with daughter for transportation   Dietary issues and exercise activities discussed: Current Exercise Habits: The patient does not participate in regular exercise at present, Exercise limited by: orthopedic condition(s)   Goals Addressed             This Visit's Progress    AWV   On track    11/14/2019 AWV Goal: Fall Prevention  Over the next year, patient will decrease their risk for falls by: Using assistive devices, such as a cane or walker, as needed Identifying fall risks within their home and correcting them by: Removing throw rugs Adding handrails to stairs or ramps Removing clutter and keeping a clear pathway throughout the home Increasing light, especially at night Adding shower handles/bars Raising toilet seat Identifying potential personal risk factors for falls: Medication side effects Incontinence/urgency Vestibular dysfunction Hearing loss Musculoskeletal disorders Neurological disorders Orthostatic hypotension         Depression Screen    11/30/2022    9:51 AM 10/19/2022   11:13 AM 09/10/2022    2:53 PM 09/02/2022   10:23 AM 07/19/2022   11:09 AM 06/07/2022    1:19 PM 05/28/2022    3:56 PM  PHQ 2/9 Scores  PHQ - 2 Score 0 0 0 0 0 0 0  PHQ- 9 Score 0 0 0 1 0 0 0    Fall Risk    11/30/2022    9:49 AM 10/19/2022   11:13 AM 07/19/2022   11:09 AM 06/07/2022    1:19 PM 05/28/2022    3:56 PM  Fall Risk   Falls in the past year? 0 0 1 1 0  Number falls in past yr: 0  0 0   Injury with Fall? 0  0 0   Risk for fall due to : No Fall Risks  History of fall(s) History of fall(s)   Follow up Falls prevention discussed  Falls prevention discussed Falls evaluation completed     [place]:  Any stairs in or around the home? Yes  If so, are there any without handrails? No  Home free of loose throw rugs in walkways, pet beds, electrical cords, etc? Yes  Adequate lighting in your home to reduce  risk of falls? Yes   ASSISTIVE DEVICES UTILIZED TO PREVENT FALLS:  Life alert? No  Use of a cane, walker or w/c? Yes  Grab bars in the bathroom? Yes  Shower chair or bench in shower? Yes  Elevated toilet seat or a handicapped toilet? Yes       10/30/2018    2:21 PM 10/26/2017   10:37 AM 06/06/2015   10:43 AM  MMSE - Mini Mental State Exam  Orientation to time '5 5 5  '$ Orientation to Place '5 5 5  '$ Registration '3 3 3  '$ Attention/ Calculation '5 5 5  '$ Recall '3 3 3  '$ Language- name 2 objects '2 2 2  '$ Language- repeat 0 1 1  Language- follow 3 step command '3 3 3  '$ Language- read & follow direction '1 1 1  '$ Write a sentence '1 1 1  '$ Copy design '1 1 1  '$ Total score '29 30 30        '$ 11/30/2022    9:53 AM 11/27/2021   10:09 AM 11/18/2020   10:43 AM 11/14/2019   10:50 AM  6CIT Screen  What Year? 0 points 0 points 0 points 0 points  What month? 0 points  0 points 0 points 0 points  What time? 0 points 0 points 0 points 0 points  Count back from 20 0 points 0 points 0 points 0 points  Months in reverse 0 points 0 points 2 points 0 points  Repeat phrase 0 points 0 points 2 points 0 points  Total Score 0 points 0 points 4 points 0 points    Immunizations Immunization History  Administered Date(s) Administered   Fluad Quad(high Dose 65+) 08/29/2019, 10/14/2020, 10/16/2021, 10/19/2022   Influenza, High Dose Seasonal PF 09/28/2016, 10/06/2017, 10/17/2018   Influenza,inj,Quad PF,6+ Mos 09/26/2013, 09/09/2014, 10/13/2015   Moderna Sars-Covid-2 Vaccination 12/22/2019, 01/19/2020, 11/10/2020   Pneumococcal Conjugate-13 05/01/2015   Pneumococcal Polysaccharide-23 03/29/2002   Td 10/29/2008, 08/31/2011   Tdap 08/31/2011   Zoster Recombinat (Shingrix) 10/30/2018, 01/15/2021    TDAP status: Due, Education has been provided regarding the importance of this vaccine. Advised may receive this vaccine at local pharmacy or Health Dept. Aware to provide a copy of the vaccination record if obtained from local  pharmacy or Health Dept. Verbalized acceptance and understanding.  Flu Vaccine status: Up to date  Pneumococcal vaccine status: Up to date  Covid-19 vaccine status: Completed vaccines  Qualifies for Shingles Vaccine? Yes   Zostavax completed Yes   Shingrix Completed?: Yes  Screening Tests Health Maintenance  Topic Date Due   OPHTHALMOLOGY EXAM  07/23/2021   DTaP/Tdap/Td (4 - Td or Tdap) 08/30/2021   COVID-19 Vaccine (4 - 2023-24 season) 07/30/2022   MAMMOGRAM  07/20/2023 (Originally 12/06/2019)   FOOT EXAM  01/15/2023   HEMOGLOBIN A1C  04/19/2023   Medicare Annual Wellness (AWV)  12/01/2023   Pneumonia Vaccine 51+ Years old  Completed   INFLUENZA VACCINE  Completed   DEXA SCAN  Completed   Zoster Vaccines- Shingrix  Completed   HPV VACCINES  Aged Out    Health Maintenance  Health Maintenance Due  Topic Date Due   OPHTHALMOLOGY EXAM  07/23/2021   DTaP/Tdap/Td (4 - Td or Tdap) 08/30/2021   COVID-19 Vaccine (4 - 2023-24 season) 07/30/2022    Colorectal cancer screening: No longer required.   Mammogram status: No longer required due to age.  Bone Density status: Completed 01/21/2021. Results reflect: Bone density results: OSTEOPENIA. Repeat every 5 years.  Lung Cancer Screening: (Low Dose CT Chest recommended if Age 5-80 years, 30 pack-year currently smoking OR have quit w/in 15years.) does not qualify.   Lung Cancer Screening Referral: n/a  Additional Screening:  Hepatitis C Screening: does not qualify;  Vision Screening: Recommended annual ophthalmology exams for early detection of glaucoma and other disorders of the eye. Is the patient up to date with their annual eye exam?  No  Who is the provider or what is the name of the office in which the patient attends annual eye exams? [facility]  If pt is not established with a provider, would they like to be referred to a provider to establish care? No .   Dental Screening: Recommended annual dental exams for  proper oral hygiene  Community Resource Referral / Chronic Care Management: CRR required this visit?  No   CCM required this visit?  No      Plan:     I have personally reviewed and noted the following in the patient's chart:   Medical and social history Use of alcohol, tobacco or illicit drugs  Current medications and supplements including opioid prescriptions. Patient is not currently taking opioid prescriptions. Functional ability and status Nutritional status  Physical activity Advanced directives List of other physicians Hospitalizations, surgeries, and ER visits in previous 12 months Vitals Screenings to include cognitive, depression, and falls Referrals and appointments  In addition, I have reviewed and discussed with patient certain preventive protocols, quality metrics, and best practice recommendations. A written personalized care plan for preventive services as well as general preventive health recommendations were provided to patient.     Daphane Shepherd, LPN   01/30/5455   Nurse Notes: Due TDAP vaccine , Due Eye exam

## 2022-11-30 NOTE — Patient Instructions (Signed)
Ms. Debra Campbell , Thank you for taking time to come for your Medicare Wellness Visit. I appreciate your ongoing commitment to your health goals. Please review the following plan we discussed and let me know if I can assist you in the future.   These are the goals we discussed:  Goals       AWV      11/14/2019 AWV Goal: Fall Prevention  Over the next year, patient will decrease their risk for falls by: Using assistive devices, such as a cane or walker, as needed Identifying fall risks within their home and correcting them by: Removing throw rugs Adding handrails to stairs or ramps Removing clutter and keeping a clear pathway throughout the home Increasing light, especially at night Adding shower handles/bars Raising toilet seat Identifying potential personal risk factors for falls: Medication side effects Incontinence/urgency Vestibular dysfunction Hearing loss Musculoskeletal disorders Neurological disorders Orthostatic hypotension        Have 3 meals a day      HEMOGLOBIN A1C < 7.0       Increase non-starchy vegetables - carrots, green bean, squash, zucchini, tomatoes, onions, peppers, spinach and other green leafy vegetables, cabbage, lettuce, cucumbers, asparagus, okra (not fried), eggplant limit sugar and processed foods (cakes, cookies, ice cream, crackers and chips) Increase fresh fruit but limit serving sizes 1/2 cup or about the size of tennis or baseball limit red meat to no more than 1-2 times per week (serving size about the size of your palm) Choose whole grains / lean proteins - whole wheat bread, quinoa, whole grain rice (1/2 cup), fish, chicken, [place]       I want to stop taking potassium (pt-stated)      Current Barriers:  Knowledge Deficits related to need for potassium supplement  Nurse Case Manager Clinical Goal(s):  Over the next 30 days, patient will work with RN Care Manager/PCP to determine if potassium supplementation is necessary  Interventions:   Medication list reviewed and discussed potassium 31mq once weekly Talked with patient Patient advised that insurance will no longer cover potassium supplement with current directions of once weekly. Recommended OTC.  Chart reviewed including recent lab results with normal potassium level Collaboration with PCP regarding need for supplement Advised patient that she can discontinue potassium Advised that we will rck potassium level at next visit on 12/03/2018  Patient Self Care Activities:  Performs ADL's independently Performs IADL's independently   Initial goal documentation       Increase physical activity      Try to do chair exercises daily - refer to handout given today      RN CCM Goal       General Goal for Embedded Chronic Care Management Program  Current Barriers:  Chronic Disease Management support and education needs related to HTN, Afib, DM, Neuropathy, Arthritis, PVD, peripheral edema  Nurse Case Manager Clinical Goal(s):  Over the next 30 days, Ms. JHarbwill meet with the RN Care Manager to set goals related to the self-management of her chronic medical conditions.   Interventions:  A review of relevant office notes, correspondence notes, imaging studies, lab reports, history and health care maintenance items was performed and pertinent items were discussed with the patient Collaboration with PCP and appropriate care team members performed as necessary Medications reviewed with the patient and reconciled Intake Assessment, General Assessments, and Social Determinants of Health were completed (any needs identified are addressed in a separate goal) CCM resources reviewed and team member roles explained. All questions  were answered. Patient provided with CCM contact information and recommended that she reach out with any chronic care management needs Will mail printed materials with CCM consent information, CCM contact information, current goals, and follow-up  plan  Patient Self Care Activities:  Self administers medications as prescribed Calls pharmacy for medication refills Calls provider office for new concerns or questions  Initial goal documentation         This is a list of the screening recommended for you and due dates:  Health Maintenance  Topic Date Due   Eye exam for diabetics  07/23/2021   DTaP/Tdap/Td vaccine (4 - Td or Tdap) 08/30/2021   COVID-19 Vaccine (4 - 2023-24 season) 07/30/2022   Mammogram  07/20/2023*   Complete foot exam   01/15/2023   Hemoglobin A1C  04/19/2023   Medicare Annual Wellness Visit  12/01/2023   Pneumonia Vaccine  Completed   Flu Shot  Completed   DEXA scan (bone density measurement)  Completed   Zoster (Shingles) Vaccine  Completed   HPV Vaccine  Aged Out  *Topic was postponed. The date shown is not the original due date.    Advanced directives: Please bring a copy of your health care power of attorney and living will to the office to be added to your chart at your convenience.   Conditions/risks identified: Aim for 30 minutes of exercise or brisk walking, 6-8 glasses of water, and 5 servings of fruits and vegetables each day.   Next appointment: Follow up in one year for your annual wellness visit    Preventive Care 65 Years and Older, Female Preventive care refers to lifestyle choices and visits with your health care provider that can promote health and wellness. What does preventive care include? A yearly physical exam. This is also called an annual well check. Dental exams once or twice a year. Routine eye exams. Ask your health care provider how often you should have your eyes checked. Personal lifestyle choices, including: Daily care of your teeth and gums. Regular physical activity. Eating a healthy diet. Avoiding tobacco and drug use. Limiting alcohol use. Practicing safe sex. Taking low-dose aspirin every day. Taking vitamin and mineral supplements as recommended by your  health care provider. What happens during an annual well check? The services and screenings done by your health care provider during your annual well check will depend on your age, overall health, lifestyle risk factors, and family history of disease. Counseling  Your health care provider may ask you questions about your: Alcohol use. Tobacco use. Drug use. Emotional well-being. Home and relationship well-being. Sexual activity. Eating habits. History of falls. Memory and ability to understand (cognition). Work and work Statistician. Reproductive health. Screening  You may have the following tests or measurements: Height, weight, and BMI. Blood pressure. Lipid and cholesterol levels. These may be checked every 5 years, or more frequently if you are over 12 years old. Skin check. Lung cancer screening. You may have this screening every year starting at age 23 if you have a 30-pack-year history of smoking and currently smoke or have quit within the past 15 years. Fecal occult blood test (FOBT) of the stool. You may have this test every year starting at age 62. Flexible sigmoidoscopy or colonoscopy. You may have a sigmoidoscopy every 5 years or a colonoscopy every 10 years starting at age 76. Hepatitis C blood test. Hepatitis B blood test. Sexually transmitted disease (STD) testing. Diabetes screening. This is done by checking your blood sugar (glucose) after  you have not eaten for a while (fasting). You may have this done every 1-3 years. Bone density scan. This is done to screen for osteoporosis. You may have this done starting at age 11. Mammogram. This may be done every 1-2 years. Talk to your health care provider about how often you should have regular mammograms. Talk with your health care provider about your test results, treatment options, and if necessary, the need for more tests. Vaccines  Your health care provider may recommend certain vaccines, such as: Influenza vaccine.  This is recommended every year. Tetanus, diphtheria, and acellular pertussis (Tdap, Td) vaccine. You may need a Td booster every 10 years. Zoster vaccine. You may need this after age 83. Pneumococcal 13-valent conjugate (PCV13) vaccine. One dose is recommended after age 53. Pneumococcal polysaccharide (PPSV23) vaccine. One dose is recommended after age 27. Talk to your health care provider about which screenings and vaccines you need and how often you need them. This information is not intended to replace advice given to you by your health care provider. Make sure you discuss any questions you have with your health care provider. Document Released: 12/12/2015 Document Revised: 08/04/2016 Document Reviewed: 09/16/2015 Elsevier Interactive Patient Education  2017 [place] Prevention in the Home Falls can cause injuries. They can happen to people of all ages. There are many things you can do to make your home safe and to help prevent falls. What can I do on the outside of my home? Regularly fix the edges of walkways and driveways and fix any cracks. Remove anything that might make you trip as you walk through a door, such as a raised step or threshold. Trim any bushes or trees on the path to your home. Use bright outdoor lighting. Clear any walking paths of anything that might make someone trip, such as rocks or tools. Regularly check to see if handrails are loose or broken. Make sure that both sides of any steps have handrails. Any raised decks and porches should have guardrails on the edges. Have any leaves, snow, or ice cleared regularly. Use sand or salt on walking paths during winter. Clean up any spills in your garage right away. This includes oil or grease spills. What can I do in the bathroom? Use night lights. Install grab bars by the toilet and in the tub and shower. Do not use towel bars as grab bars. Use non-skid mats or decals in the tub or shower. If you need to sit  down in the shower, use a plastic, non-slip stool. Keep the floor dry. Clean up any water that spills on the floor as soon as it happens. Remove soap buildup in the tub or shower regularly. Attach bath mats securely with double-sided non-slip rug tape. Do not have throw rugs and other things on the floor that can make you trip. What can I do in the bedroom? Use night lights. Make sure that you have a light by your bed that is easy to reach. Do not use any sheets or blankets that are too big for your bed. They should not hang down onto the floor. Have a firm chair that has side arms. You can use this for support while you get dressed. Do not have throw rugs and other things on the floor that can make you trip. What can I do in the kitchen? Clean up any spills right away. Avoid walking on wet floors. Keep items that you use a lot in easy-to-reach places. If you  need to reach something above you, use a strong step stool that has a grab bar. Keep electrical cords out of the way. Do not use floor polish or wax that makes floors slippery. If you must use wax, use non-skid floor wax. Do not have throw rugs and other things on the floor that can make you trip. What can I do with my stairs? Do not leave any items on the stairs. Make sure that there are handrails on both sides of the stairs and use them. Fix handrails that are broken or loose. Make sure that handrails are as long as the stairways. Check any carpeting to make sure that it is firmly attached to the stairs. Fix any carpet that is loose or worn. Avoid having throw rugs at the top or bottom of the stairs. If you do have throw rugs, attach them to the floor with carpet tape. Make sure that you have a light switch at the top of the stairs and the bottom of the stairs. If you do not have them, ask someone to add them for you. What else can I do to help prevent falls? Wear shoes that: Do not have high heels. Have rubber bottoms. Are  comfortable and fit you well. Are closed at the toe. Do not wear sandals. If you use a stepladder: Make sure that it is fully opened. Do not climb a closed stepladder. Make sure that both sides of the stepladder are locked into place. Ask someone to hold it for you, if possible. Clearly mark and make sure that you can see: Any grab bars or handrails. First and last steps. Where the edge of each step is. Use tools that help you move around (mobility aids) if they are needed. These include: Canes. Walkers. Scooters. Crutches. Turn on the lights when you go into a dark area. Replace any light bulbs as soon as they burn out. Set up your furniture so you have a clear path. Avoid moving your furniture around. If any of your floors are uneven, fix them. If there are any pets around you, be aware of where they are. Review your medicines with your doctor. Some medicines can make you feel dizzy. This can increase your chance of falling. Ask your doctor what other things that you can do to help prevent falls. This information is not intended to replace advice given to you by your health care provider. Make sure you discuss any questions you have with your health care provider. Document Released: 09/11/2009 Document Revised: 04/22/2016 Document Reviewed: 12/20/2014 Elsevier Interactive Patient Education  2017 [employer].

## 2022-12-09 ENCOUNTER — Telehealth: Payer: Self-pay | Admitting: Cardiology

## 2022-12-09 NOTE — Telephone Encounter (Signed)
*  STAT* If patient is at the pharmacy, call can be transferred to refill team.   1. Which medications need to be refilled? (please list name of each medication and dose if known) rivaroxaban (XARELTO) 20 MG TABS tablet  2. Which pharmacy/location (including street and city if local pharmacy) is medication to be sent to? [facility], [place] to [facility]istered [facility]   3. Do they need a 30 day or 90 day supply? [place]

## 2022-12-10 ENCOUNTER — Other Ambulatory Visit: Payer: Self-pay

## 2022-12-10 DIAGNOSIS — I4821 Permanent atrial fibrillation: Secondary | ICD-10-CM

## 2022-12-10 MED ORDER — RIVAROXABAN 20 MG PO TABS: ORAL_TABLET | ORAL | 2 refills | Status: DC

## 2022-12-10 NOTE — Telephone Encounter (Signed)
Prescription refill request for Xarelto received.  Indication:afib Last office visit:5/23 Weight:88.9 kg Age:89 Scr:1.0 CrCl:54.57  ml/min  Prescription refilled

## 2023-01-04 NOTE — Progress Notes (Signed)
Cardiology Clinic Note   Patient Name: Debra Campbell Date of Encounter: 01/06/2023  Primary Care Provider:  Chevis Pretty, [place] Primary Cardiologist:  Glenetta Hew, MD  Patient Profile    Debra Campbell 89 year old female presents the clinic today for follow-up evaluation of her hypertension and atrial fibrillation.  Past Medical History    Past Medical History:  Diagnosis Date   Aortic calcification ([place]) 2013   Atherosclerotic calcifications of the abdominal aorta and branch noted on CT Abd/Pelvis   Arthritis    legs, lower back, hands   Cancer (HCC)    endometrial   Diabetes mellitus    Ectopic pregnancy    Eczema    Headache(784.0)    hx of   History of kidney stones    Hyperlipidemia    no meds   Hypertension    Hypothyroidism    Persistent atrial fibrillation ([place]) 09/28/2016   Relatively new Dx: Asymptomatic.  Rate controlled without medication.   SVD (spontaneous vaginal delivery)    x 3   Thyroid disease    Past Surgical History:  Procedure Laterality Date   ABDOMINAL HYSTERECTOMY Bilateral 06/26/2013   Procedure: TOTAL ABDOMINAL HYSTERECTOMY WITH BILATERAL SALPINGO OOPHERECTOMY WITH PELVIC LYMPHADNECTOMY;  Surgeon: Alvino Chapel, MD;  Location: [facility];  Service: Gynecology;  Laterality: Bilateral;   BACK SURGERY  06/26/2013   ECTOPIC PREGNANCY SURGERY     HERNIA REPAIR     HYSTEROSCOPY WITH D & C N/A 04/26/2013   Procedure: DILATATION AND CURETTAGE /HYSTEROSCOPY;  Surgeon: Woodroe Mode, MD;  Location: [facility];  Service: Gynecology;  Laterality: N/A;   JOINT REPLACEMENT     LAPAROTOMY     for ectopic pregnancy   LAPAROTOMY N/A 06/26/2013   Procedure: EXPLORATORY LAPAROTOMY;  Surgeon: Alvino Chapel, MD;  Location: [facility];  Service: Gynecology;  Laterality: N/A;   lt knee arthroscopy     rt total hip replacement     TONSILLECTOMY     TOTAL KNEE ARTHROPLASTY Left 12/22/2016   Procedure: LEFT TOTAL KNEE ARTHROPLASTY;   Surgeon: Latanya Maudlin, MD;  Location: [facility];  Service: Orthopedics;  Laterality: Left;   TRANSTHORACIC ECHOCARDIOGRAM  09/2018   EF 60-65%. Mild AS.  Mod-Severe PAH 60 mmHg   WISDOM TOOTH EXTRACTION      Allergies  Allergies  Allergen Reactions   Ace Inhibitors Cough   Iohexol      Desc: HIVES 40 YEARS AGO    Statins     myalgia   Penicillins Swelling and Rash    Has patient had a PCN reaction causing immediate rash, facial/tongue/throat swelling, SOB or lightheadedness with hypotension: Yes Has patient had a PCN reaction causing severe rash involving mucus membranes or skin necrosis: No Has patient had a PCN reaction that required hospitalization Yes Has patient had a PCN reaction occurring within the last 10 years: No If all of the above answers are "NO", then may proceed with Cephalosporin use.    Sulfa Antibiotics Rash    History of Present Illness    Debra Campbell has a PMH of HTN, atrial fibrillation CHA2DS2-VASc score 5, aortic calcification, mild aortic stenosis, HLD, hypothyroidism, diabetic neuropathy, osteoarthritis, obesity, peripheral edema, and is status post left total knee replacement.  She was seen in the emergency department 11/28/2021 with nephrolithiasis/ureterolithiasis.  She reported flank pain with hematuria.  She was noted to have a left ureteral stone with hydronephrosis.  She was treated with analgesia and narcotics.  She  was discharged on Flomax and referred to urology.  She was seen in follow-up by Dr. Ellyn Hack on 04/12/2022.  During that time she continued to be stable from a cardiac standpoint.  She noted some heart fluttering with getting upset and exertion.  She was not very active and was using a cane in the house and a walker for longer distances.  She was limited in her mobility due to back and hip pain as well as knee pain.    She presents to the clinic today for follow-up evaluation and states she continues to use her walker with ambulation.   She is no longer using her cane and is now using her walker in her house due to her back and left lower extremity pain.  This also limits her physical activity.  Her blood pressure is well-controlled at 128/78.  Her EKG today shows atrial fibrillation 74 bpm.  Her biggest complaint today is with her refills of her medications.  She has trouble with [facility].  She requests 90-day supply and 3 refills.  I have asked her to increase the fiber in her diet, we will continue her current medication regimen and plan follow-up in 9 months.  Today she denies chest pain, shortness of breath, lower extremity edema, fatigue, palpitations, melena, hematuria, hemoptysis, diaphoresis, weakness, presyncope, syncope, orthopnea, and PND.       Home Medications    Prior to Admission medications   Medication Sig Start Date End Date Taking? Authorizing Provider  Accu-Chek Softclix Lancets lancets CHECK BLOOD SUGAR ONCE DAILY DX E11.42 09/11/21   Hassell Done, [name], FNP  acetaminophen (TYLENOL) 500 MG tablet Take 1 tablet (500 mg total) by mouth every 8 (eight) hours as needed. 10/13/18   Florencia Reasons, MD  furosemide (LASIX) 40 MG tablet Take 2 tablets (80 mg total) by mouth 2 (two) times daily. MAY TAKE AN ADDITIONAL 40 MG IF WEIGHT GAIN OF 3 LBS OR MORE OVER NIGHT 07/19/22 01/15/23  Hassell Done, [name], FNP  gabapentin (NEURONTIN) 300 MG capsule Take 1 capsule (300 mg total) by mouth 2 (two) times daily. 07/19/22   Hassell Done [name], FNP  glucose blood (ACCU-CHEK GUIDE) test strip CHECK BLOOD SUGAR ONCE DAILY DX E11.42 06/08/21   Hassell Done [name], FNP  levothyroxine (SYNTHROID) 125 MCG tablet Take 1 tablet (125 mcg total) by mouth daily. 07/19/22   Hassell Done [name], FNP  metFORMIN (GLUCOPHAGE) 500 MG tablet Take 1 tablet (500 mg total) by mouth 2 (two) times daily with a meal. 10/25/22   Hassell Done, [name], FNP  metoprolol succinate (TOPROL-XL) 25 MG 24 hr tablet Take 1 tablet (25 mg total) by mouth daily.  07/19/22   Hassell Done, [name], FNP  nystatin cream (MYCOSTATIN) Apply 1 Application topically 2 (two) times daily. 10/19/22   Hassell Done [name], FNP  rivaroxaban (XARELTO) 20 MG TABS tablet TAKE 1 TABLET BY MOUTH EVERY DAY WITH BREAKFAST 12/10/22   Leonie Man, MD    Family History    Family History  Problem Relation Age of Onset   Heart disease Father    Thyroid disease Father    Heart attack Father    Heart disease Sister        open heart surgery   Cancer Sister        breast   Neuropathy Sister        secondary to cancer treatment   Breast cancer Sister    Suicidality Brother 37   Arthritis Mother    Cancer Daughter    Breast cancer Daughter  She indicated that her mother is deceased. She indicated that her father is deceased. She indicated that her sister is alive. She indicated that her brother is deceased. She indicated that only one of her two daughters is alive. She indicated that her son is alive.  Social History    Social History   Socioeconomic History   Marital status: Married    Spouse name: Not on file   Number of children: 3   Years of education: Not on file   Highest education level: High school graduate  Occupational History   Occupation: Retired    Comment: [employer]  Tobacco Use   Smoking status: Never   Smokeless tobacco: Never  Vaping Use   Vaping Use: Never used  Substance and Sexual Activity   Alcohol use: No   Drug use: No   Sexual activity: Not Currently    Birth control/protection: Post-menopausal  Other Topics Concern   Not on file  Social History Narrative   Not on file   Social Determinants of Health   Financial Resource Strain: Low Risk  (11/30/2022)   Overall Financial Resource Strain (CARDIA)    Difficulty of Paying Living Expenses: Not hard at all  Food Insecurity: No Food Insecurity (11/30/2022)   Hunger Vital Sign    Worried About Running Out of Food in the Last Year: Never true    [place] in the Last Year:  Never true  Transportation Needs: No Transportation Needs (11/30/2022)   PRAPARE - Hydrologist (Medical): No    Lack of Transportation (Non-Medical): No  Physical Activity: Insufficiently Active (11/30/2022)   Exercise Vital Sign    Days of Exercise per Week: 3 days    Minutes of Exercise per Session: 30 min  Stress: No Stress Concern Present (11/30/2022)   [place]    Feeling of Stress : Not at all  Social Connections: Moderately Isolated (11/30/2022)   Social Connection and Isolation Panel [NHANES]    Frequency of Communication with Friends and Family: More than three times a week    Frequency of Social Gatherings with Friends and Family: More than three times a week    Attends Religious Services: Never    Marine scientist or Organizations: No    Attends Archivist Meetings: Never    Marital Status: Married  Human resources officer Violence: Not At Risk (11/30/2022)   Humiliation, Afraid, Rape, and Kick questionnaire    Fear of Current or Ex-Partner: No    Emotionally Abused: No    Physically Abused: No    Sexually Abused: No     Review of Systems    General:  No chills, fever, night sweats or weight changes.  Cardiovascular:  No chest pain, dyspnea on exertion, edema, orthopnea, palpitations, paroxysmal nocturnal dyspnea. Dermatological: No rash, lesions/masses Respiratory: No cough, dyspnea Urologic: No hematuria, dysuria Abdominal:   No nausea, vomiting, diarrhea, bright red blood per rectum, melena, or hematemesis Neurologic:  No visual changes, wkns, changes in mental status. All other systems reviewed and are otherwise negative except as noted above.  Physical Exam    VS:  BP 128/78   Pulse 74   Ht '5\' 5"'$  (1.651 m)   Wt 200 lb 9.6 oz (91 kg)   BMI 33.38 kg/m  , BMI Body mass index is 33.38 kg/m. GEN: Well nourished, well developed, in no acute distress. HEENT:  normal. Neck: Supple, no JVD,  carotid bruits, or masses. Cardiac: Irregularly irregular, no murmurs, rubs, or gallops. No clubbing, cyanosis, edema.  Radials/DP/PT 2+ and equal bilaterally.  Respiratory:  Respirations regular and unlabored, clear to auscultation bilaterally. GI: Soft, nontender, nondistended, BS + x 4. MS: no deformity or atrophy. Skin: warm and dry, no rash. Neuro:  Strength and sensation are intact. Psych: Normal affect.  Accessory Clinical Findings    Recent Labs: 07/19/2022: TSH 0.946 10/19/2022: ALT 20; BUN 20; Creatinine, Ser 1.00; Hemoglobin 13.8; Platelets 180; Potassium 4.1; Sodium 142   Recent Lipid Panel    Component Value Date/Time   CHOL 202 (H) 10/19/2022 1059   CHOL 230 (H) 03/19/2013 1034   TRIG 130 10/19/2022 1059   TRIG 253 (H) 05/01/2015 0913   TRIG 149 03/19/2013 1034   HDL 62 10/19/2022 1059   HDL 42 05/01/2015 0913   HDL 52 03/19/2013 1034   CHOLHDL 3.3 10/19/2022 1059   LDLCALC 117 (H) 10/19/2022 1059   LDLCALC 122 (H) 07/11/2014 0922   LDLCALC 148 (H) 03/19/2013 1034         ECG personally reviewed by me today-atrial fibrillation 74 bpm- No acute changes  Echocardiogram 10/12/2018  Study Conclusions   - Left ventricle: The cavity size was normal. Wall thickness was    normal. Systolic function was normal. The estimated ejection    fraction was in the range of 60% to 65%. Wall motion was normal;    there were no regional wall motion abnormalities.  - Aortic valve: Mildly to moderately calcified annulus. Moderately    thickened, moderately calcified leaflets. There was mild    stenosis. Valve area (VTI): 2.34 cm^2. Valve area (Vmax): 2.52    cm^2. Valve area (Vmean): 2.71 cm^2.  - Mitral valve: There was mild regurgitation.  - Left atrium: The atrium was mildly dilated.  - Right ventricle: Systolic function was mildly reduced.  - Right atrium: The atrium was mildly dilated.  - Tricuspid valve: There was moderate  regurgitation.  - Pulmonary arteries: Systolic pressure was moderately to severely    increased. PA peak pressure: 60 mm Hg (S).   -------------------------------------------------------------------  Study data:  Comparison was made to the study of 10/22/2016.  Study  status:  Routine.  Procedure:  The patient reported no pain pre or  post test. Transthoracic echocardiography. Image quality was  adequate.  Study completion:  There were no complications.  Transthoracic echocardiography.  M-mode, complete 2D, spectral  Doppler, and color Doppler.  Birthdate:  Patient birthdate:  28-Aug-1932.  Age:  Patient is 89 yr old.  Sex:  Gender: female.  BMI: 39.4 kg/m^2.  Blood pressure:     115/61  Patient status:  Inpatient.  Study date:  Study date: 10/12/2018. Study time: 02:45  PM.  Location:  Echo laboratory.   -------------------------------------------------------------------   -------------------------------------------------------------------  Left ventricle:  The cavity size was normal. Wall thickness was  normal. Systolic function was normal. The estimated ejection  fraction was in the range of 60% to 65%. Wall motion was normal;  there were no regional wall motion abnormalities. The study was not  technically sufficient to allow evaluation of LV diastolic  dysfunction due to atrial fibrillation.   -------------------------------------------------------------------  Aortic valve:   Mildly to moderately calcified annulus. Moderately  thickened, moderately calcified leaflets.  Doppler:   There was  mild stenosis.      VTI ratio of LVOT to aortic valve: 0.68. Valve  area (VTI): 2.34 cm^2. Indexed valve area (VTI): 1.05 cm^2/m^2.  Peak velocity ratio of LVOT to aortic valve: 0.73. Valve area  (Vmax): 2.52 cm^2. Indexed valve area (Vmax): 1.13 cm^2/m^2. Mean  velocity ratio of LVOT to aortic valve: 0.78. Valve area (Vmean):  2.71 cm^2. Indexed valve area (Vmean): 1.22 cm^2/m^2.    Mean   gradient (S): 9 mm Hg. Peak gradient (S): 16 mm Hg.   -------------------------------------------------------------------  Aorta: Aortic root: The aortic root was normal in size.  Ascending aorta: The ascending aorta was normal in size.   -------------------------------------------------------------------  Mitral valve:   Structurally normal valve.   Leaflet separation was  normal.  Doppler:  Transvalvular velocity was within the normal  range. There was no evidence for stenosis. There was mild  regurgitation.    Valve area by pressure half-time: 3.44 cm^2.  Indexed valve area by pressure half-time: 1.55 cm^2/m^2.    Peak  gradient (D): 9 mm Hg.   -------------------------------------------------------------------  Left atrium:  The atrium was mildly dilated.   -------------------------------------------------------------------  Right ventricle:  The cavity size was normal. Systolic function was  mildly reduced.   -------------------------------------------------------------------  Pulmonic valve:    The valve appears to be grossly normal.  Doppler:  There was mild to moderate regurgitation.   -------------------------------------------------------------------  Tricuspid valve:   The valve appears to be grossly normal.  Doppler:  There was moderate regurgitation.   -------------------------------------------------------------------  Pulmonary artery:   Systolic pressure was moderately to severely  increased.   -------------------------------------------------------------------  Right atrium:  The atrium was mildly dilated.   -------------------------------------------------------------------  Pericardium: There was no pericardial effusion.    Assessment & Plan   1.  Atrial fibrillation-EKG today shows atrial fibrillation 74 bpm.  Denies episodes of accelerated or irregular heartbeat.  Cardiac unaware.  Reports compliance with Xarelto and denies bleeding issues. Continue  Xarelto, metoprolol Avoid triggers caffeine, chocolate, EtOH, dehydration etc.  Essential hypertension-BP today 128/78 Continue metoprolol Heart healthy low-sodium diet-salty 6 given Increase physical activity as tolerated Maintain blood pressure log  Hyperlipidemia-LDL 117 on 10/19/22.  Previously noted to wish to defer cholesterol reducing medication Heart healthy low-sodium high-fiber diet Increase physical activity as tolerated Follows with PCP  Pulmonary hypertension-no increased DOE.  Fairly sedentary.  Previously felt to have some diastolic dysfunction in the setting of atrial fibrillation and OHS. Increase physical activity as tolerated No plans for repeat echocardiogram  Aortic calcification, mild aortic stenosis-no increased DOE or activity intolerance.  Previously wished to defer lipid lowering therapy and is statin intolerant.  Continue good blood pressure control Previously wished to defer repeat echocardiogram  Disposition: Follow-up with Dr. Ellyn Hack or me in 9 months.   Jossie Ng. Charlis Harner NP-C     01/06/2023, 11:42 AM [place] [address] Office [phone] Fax [phone]    I spent 14 minutes examining this patient, reviewing medications, and using patient centered shared decision making involving her cardiac care.  Prior to her visit I spent greater than 20 minutes reviewing her past medical history,  medications, and prior cardiac tests.

## 2023-01-06 ENCOUNTER — Encounter: Payer: Self-pay | Admitting: General Practice

## 2023-01-06 ENCOUNTER — Ambulatory Visit: Payer: Medicare Other | Attending: General Practice | Admitting: General Practice

## 2023-01-06 VITALS — BP 128/78 | HR 74 | Ht 65.0 in | Wt 200.6 lb

## 2023-01-06 DIAGNOSIS — I152 Hypertension secondary to endocrine disorders: Secondary | ICD-10-CM | POA: Diagnosis not present

## 2023-01-06 DIAGNOSIS — E1159 Type 2 diabetes mellitus with other circulatory complications: Secondary | ICD-10-CM | POA: Diagnosis not present

## 2023-01-06 DIAGNOSIS — I4821 Permanent atrial fibrillation: Secondary | ICD-10-CM | POA: Insufficient documentation

## 2023-01-06 DIAGNOSIS — E1169 Type 2 diabetes mellitus with other specified complication: Secondary | ICD-10-CM | POA: Insufficient documentation

## 2023-01-06 DIAGNOSIS — E785 Hyperlipidemia, unspecified: Secondary | ICD-10-CM | POA: Diagnosis not present

## 2023-01-06 DIAGNOSIS — I272 Pulmonary hypertension, unspecified: Secondary | ICD-10-CM | POA: Diagnosis not present

## 2023-01-06 DIAGNOSIS — I350 Nonrheumatic aortic (valve) stenosis: Secondary | ICD-10-CM | POA: Insufficient documentation

## 2023-01-06 MED ORDER — RIVAROXABAN 20 MG PO TABS: ORAL_TABLET | ORAL | 3 refills | Status: DC

## 2023-01-06 NOTE — Patient Instructions (Signed)
Medication Instructions:  Your physician recommends that you continue on your current medications as directed. Please refer to the Current Medication list given to you today.  *If you need a refill on your cardiac medications before your next appointment, please call your pharmacy*   Lab Work: NONE If you have labs (blood work) drawn today and your tests are completely normal, you will receive your results only by: [place] (if you have MyChart) OR A paper copy in the mail If you have any lab test that is abnormal or we need to change your treatment, we will call you to review the results.   Testing/Procedures: NONE   Follow-Up: At [facility], you and your health needs are our priority.  As part of our continuing mission to provide you with exceptional heart care, we have created designated Provider Care Teams.  These Care Teams include your primary Cardiologist (physician) and Advanced Practice Providers (APPs -  Physician Assistants and Nurse Practitioners) who all work together to provide you with the care you need, when you need it.  We recommend signing up for the patient portal called "MyChart".  Sign up information is provided on this After Visit Summary.  MyChart is used to connect with patients for Virtual Visits (Telemedicine).  Patients are able to view lab/test results, encounter notes, upcoming appointments, etc.  Non-urgent messages can be sent to your provider as well.   To learn more about what you can do with MyChart, go to [email or web address].    Your next appointment:   9 month(s)  Provider:   Glenetta Hew, MD  OR with Colet[facility] Memos  (Patient to call our office in July)  Other Instructions - Increase FIBER in your diet

## 2023-01-17 ENCOUNTER — Ambulatory Visit (INDEPENDENT_AMBULATORY_CARE_PROVIDER_SITE_OTHER): Payer: Medicare Other | Admitting: Nurse Practitioner

## 2023-01-17 ENCOUNTER — Encounter: Payer: Self-pay | Admitting: Nurse Practitioner

## 2023-01-17 VITALS — BP 140/81 | HR 75 | Temp 97.5°F | Resp 20

## 2023-01-17 DIAGNOSIS — E1142 Type 2 diabetes mellitus with diabetic polyneuropathy: Secondary | ICD-10-CM | POA: Diagnosis not present

## 2023-01-17 DIAGNOSIS — E1159 Type 2 diabetes mellitus with other circulatory complications: Secondary | ICD-10-CM | POA: Diagnosis not present

## 2023-01-17 DIAGNOSIS — I272 Pulmonary hypertension, unspecified: Secondary | ICD-10-CM

## 2023-01-17 DIAGNOSIS — E785 Hyperlipidemia, unspecified: Secondary | ICD-10-CM | POA: Diagnosis not present

## 2023-01-17 DIAGNOSIS — Z6833 Body mass index (BMI) 33.0-33.9, adult: Secondary | ICD-10-CM

## 2023-01-17 DIAGNOSIS — I4821 Permanent atrial fibrillation: Secondary | ICD-10-CM | POA: Diagnosis not present

## 2023-01-17 DIAGNOSIS — E1169 Type 2 diabetes mellitus with other specified complication: Secondary | ICD-10-CM | POA: Diagnosis not present

## 2023-01-17 DIAGNOSIS — R609 Edema, unspecified: Secondary | ICD-10-CM | POA: Diagnosis not present

## 2023-01-17 DIAGNOSIS — I700 Atherosclerosis of aorta: Secondary | ICD-10-CM | POA: Diagnosis not present

## 2023-01-17 DIAGNOSIS — E034 Atrophy of thyroid (acquired): Secondary | ICD-10-CM

## 2023-01-17 DIAGNOSIS — I152 Hypertension secondary to endocrine disorders: Secondary | ICD-10-CM

## 2023-01-17 DIAGNOSIS — M8588 Other specified disorders of bone density and structure, other site: Secondary | ICD-10-CM

## 2023-01-17 LAB — BAYER DCA HB A1C WAIVED: HB A1C (BAYER DCA - WAIVED): 7.5 % — ABNORMAL HIGH (ref 4.8–5.6)

## 2023-01-17 MED ORDER — FUROSEMIDE 40 MG PO TABS: 80.0000 mg | ORAL_TABLET | Freq: Two times a day (BID) | ORAL | 1 refills | Status: DC

## 2023-01-17 MED ORDER — GABAPENTIN 300 MG PO CAPS: 300.0000 mg | ORAL_CAPSULE | Freq: Two times a day (BID) | ORAL | 1 refills | Status: DC

## 2023-01-17 MED ORDER — LEVOTHYROXINE SODIUM 125 MCG PO TABS: 125.0000 ug | ORAL_TABLET | Freq: Every day | ORAL | 1 refills | Status: DC

## 2023-01-17 MED ORDER — METOPROLOL SUCCINATE ER 25 MG PO TB24: 25.0000 mg | ORAL_TABLET | Freq: Every day | ORAL | 1 refills | Status: DC

## 2023-01-17 NOTE — Progress Notes (Signed)
Subjective:    Patient ID: Debra Campbell, female    DOB: [birth year], 89 y.o.   MRN: [id number]   Chief Complaint: medical management of chronic issues     HPI:  Debra Campbell is a 89 y.o. who identifies as a female who was assigned female at birth.   Social history: Lives with: husband- family checks on them daily Work history: retired   Scientist, forensic in today for follow up of the following chronic medical issues:  1. Hypertension associated with type 2 diabetes mellitus (Debra Campbell) No c/o chest pain, sob or headache. Does not check blood pressure at home. BP Readings from Last 3 Encounters:  01/06/23 128/78  10/19/22 136/87  09/10/22 117/71     2. Aortic calcification (HCC) 3. Permanent atrial fibrillation (Debra Campbell) CHA2DS2Vasc = 5; Xarelto 4. Pulmonary hypertension, unspecified (Debra Campbell) Last saw cardiology on 01/06/23. Review of office note showed no change in plan of care. He strongly encouraged a statin but patient refused.  5. Hyperlipidemia associated with type 2 diabetes mellitus (Debra Campbell) Does not watch diet and does no dedicated  exercise. Lab Results  Component Value Date   CHOL 202 (H) 10/19/2022   HDL 62 10/19/2022   LDLCALC 117 (H) 10/19/2022   TRIG 130 10/19/2022   CHOLHDL 3.3 10/19/2022     6. Type 2 diabetes mellitus with diabetic polyneuropathy, without long-term current use of insulin (Debra Campbell) Patient does not check blood sugars at home.  Lab Results  Component Value Date   HGBA1C 6.5 (H) 10/19/2022     7. Hypothyroidism due to acquired atrophy of thyroid No issues that she is aware of. Lab Results  Component Value Date   TSH 0.946 07/19/2022     8. Diabetic polyneuropathy associated with type 2 diabetes mellitus (Debra Campbell) Does not check feet daily. Does have some numbness and tingling  9. Peripheral edema Has daily edema  10. Osteopenia of lumbar spine Refuses to repeat dexascan  11. Morbid obesity (Debra Campbell) No recent weight changes Wt Readings from Last 3  Encounters:  01/06/23 200 lb 9.6 oz (91 kg)  11/30/22 196 lb (88.9 kg)  10/19/22 196 lb (88.9 kg)   BMI Readings from Last 3 Encounters:  01/06/23 33.38 kg/m  11/30/22 32.62 kg/m  10/19/22 32.62 kg/m      New complaints: None today  Allergies  Allergen Reactions   Ace Inhibitors Cough   Iohexol      Desc: HIVES 40 YEARS AGO    Statins     myalgia   Penicillins Swelling and Rash    Has patient had a PCN reaction causing immediate rash, facial/tongue/throat swelling, SOB or lightheadedness with hypotension: Yes Has patient had a PCN reaction causing severe rash involving mucus membranes or skin necrosis: No Has patient had a PCN reaction that required hospitalization Yes Has patient had a PCN reaction occurring within the last 10 years: No If all of the above answers are "NO", then may proceed with Cephalosporin use.    Sulfa Antibiotics Rash   Outpatient Encounter Medications as of 01/17/2023  Medication Sig   Accu-Chek Softclix Lancets lancets CHECK BLOOD SUGAR ONCE DAILY DX E11.42   acetaminophen (TYLENOL) 500 MG tablet Take 1 tablet (500 mg total) by mouth every 8 (eight) hours as needed.   furosemide (LASIX) 40 MG tablet Take 2 tablets (80 mg total) by mouth 2 (two) times daily. MAY TAKE AN ADDITIONAL 40 MG IF WEIGHT GAIN OF 3 LBS OR MORE OVER NIGHT   gabapentin (NEURONTIN)  300 MG capsule Take 1 capsule (300 mg total) by mouth 2 (two) times daily.   glucose blood (ACCU-CHEK GUIDE) test strip CHECK BLOOD SUGAR ONCE DAILY DX E11.42   levothyroxine (SYNTHROID) 125 MCG tablet Take 1 tablet (125 mcg total) by mouth daily.   metFORMIN (GLUCOPHAGE) 500 MG tablet Take 1 tablet (500 mg total) by mouth 2 (two) times daily with a meal.   metoprolol succinate (TOPROL-XL) 25 MG 24 hr tablet Take 1 tablet (25 mg total) by mouth daily.   nystatin cream (MYCOSTATIN) Apply 1 Application topically 2 (two) times daily.   rivaroxaban (XARELTO) 20 MG TABS tablet TAKE 1 TABLET BY MOUTH  EVERY DAY WITH BREAKFAST   No facility-administered encounter medications on file as of 01/17/2023.    Past Surgical History:  Procedure Laterality Date   ABDOMINAL HYSTERECTOMY Bilateral 06/26/2013   Procedure: TOTAL ABDOMINAL HYSTERECTOMY WITH BILATERAL SALPINGO OOPHERECTOMY WITH PELVIC LYMPHADNECTOMY;  Surgeon: Alvino Chapel, MD;  Location: [facility];  Service: Gynecology;  Laterality: Bilateral;   BACK SURGERY  06/26/2013   ECTOPIC PREGNANCY SURGERY     HERNIA REPAIR     HYSTEROSCOPY WITH D & C N/A 04/26/2013   Procedure: DILATATION AND CURETTAGE /HYSTEROSCOPY;  Surgeon: Woodroe Mode, MD;  Location: [facility];  Service: Gynecology;  Laterality: N/A;   JOINT REPLACEMENT     LAPAROTOMY     for ectopic pregnancy   LAPAROTOMY N/A 06/26/2013   Procedure: EXPLORATORY LAPAROTOMY;  Surgeon: Alvino Chapel, MD;  Location: [facility];  Service: Gynecology;  Laterality: N/A;   lt knee arthroscopy     rt total hip replacement     TONSILLECTOMY     TOTAL KNEE ARTHROPLASTY Left 12/22/2016   Procedure: LEFT TOTAL KNEE ARTHROPLASTY;  Surgeon: Latanya Maudlin, MD;  Location: [facility];  Service: Orthopedics;  Laterality: Left;   TRANSTHORACIC ECHOCARDIOGRAM  09/2018   EF 60-65%. Mild AS.  Mod-Severe PAH 60 mmHg   WISDOM TOOTH EXTRACTION      Family History  Problem Relation Age of Onset   Heart disease Father    Thyroid disease Father    Heart attack Father    Heart disease Sister        open heart surgery   Cancer Sister        breast   Neuropathy Sister        secondary to cancer treatment   Breast cancer Sister    Suicidality Brother 66   Arthritis Mother    Cancer Daughter    Breast cancer Daughter       Controlled substance contract: n/a     Review of Systems  Constitutional:  Negative for diaphoresis.  Eyes:  Negative for pain.  Respiratory:  Negative for shortness of breath.   Cardiovascular:  Negative for chest pain, palpitations and leg swelling.   Gastrointestinal:  Negative for abdominal pain.  Endocrine: Negative for polydipsia.  Skin:  Negative for rash.  Neurological:  Negative for dizziness, weakness and headaches.  Hematological:  Does not bruise/bleed easily.  All other systems reviewed and are negative.      Objective:   Physical Exam Vitals and nursing note reviewed.  Constitutional:      General: She is not in acute distress.    Appearance: Normal appearance. She is well-developed.  HENT:     Head: Normocephalic.     Right Ear: Tympanic membrane normal.     Left Ear: Tympanic membrane normal.     Nose: Nose normal.  Mouth/Throat:     Mouth: Mucous membranes are moist.  Eyes:     Pupils: Pupils are equal, round, and reactive to light.  Neck:     Vascular: No carotid bruit or JVD.  Cardiovascular:     Rate and Rhythm: Normal rate. Rhythm irregular.     Heart sounds: Murmur (2/6) heard.  Pulmonary:     Effort: Pulmonary effort is normal. No respiratory distress.     Breath sounds: Normal breath sounds. No wheezing or rales.  Chest:     Chest wall: No tenderness.  Abdominal:     General: Bowel sounds are normal. There is no distension or abdominal bruit.     Palpations: Abdomen is soft. There is no hepatomegaly, splenomegaly, mass or pulsatile mass.     Tenderness: There is no abdominal tenderness.  Musculoskeletal:        General: Normal range of motion.     Cervical back: Normal range of motion and neck supple.     Right lower leg: Edema (2+) present.     Left lower leg: Edema (2+) present.  Lymphadenopathy:     Cervical: No cervical adenopathy.  Skin:    General: Skin is warm and dry.  Neurological:     Mental Status: She is alert and oriented to person, place, and time.     Deep Tendon Reflexes: Reflexes are normal and symmetric.  Psychiatric:        Behavior: Behavior normal.        Thought Content: Thought content normal.        Judgment: Judgment normal.    BP (!) 140/81   Pulse 75    Temp (!) 97.5 F (36.4 C) (Temporal)   Resp 20   SpO2 94%   HGBA1c 7.5%      Assessment & Plan:   CHELENE SFERRAZZA comes in today with chief complaint of Medical Management of Chronic Issues   Diagnosis and orders addressed:  1. Hypertension associated with type 2 diabetes mellitus (HCC) Low sodium diet - CBC with Differential/Platelet - CMP14+EGFR - metoprolol succinate (TOPROL-XL) 25 MG 24 hr tablet; Take 1 tablet (25 mg total) by mouth daily.  Dispense: 90 tablet; Refill: 1  2. Aortic calcification (HCC) 3. Permanent atrial fibrillation ([place]) CHA2DS2Vasc = 5; Xarelto 4. Pulmonary hypertension, unspecified ([place]) keep followup with cardiology  5. Hyperlipidemia associated with type 2 diabetes mellitus (HCC) Low ftatdiet - Lipid panel  6. Type 2 diabetes mellitus with diabetic polyneuropathy, without long-term current use of insulin (HCC) Stricter carb counting - Bayer DCA Hb A1c Waived  7. Hypothyroidism due to acquired atrophy of thyroid Labs pending - levothyroxine (SYNTHROID) 125 MCG tablet; Take 1 tablet (125 mcg total) by mouth daily.  Dispense: 90 tablet; Refill: 1  8. Diabetic polyneuropathy associated with type 2 diabetes mellitus ([place]) Keep check of feet - gabapentin (NEURONTIN) 300 MG capsule; Take 1 capsule (300 mg total) by mouth 2 (two) times daily.  Dispense: 180 capsule; Refill: 1  9. Peripheral edema Elevate legs when sitting - furosemide (LASIX) 40 MG tablet; Take 2 tablets (80 mg total) by mouth 2 (two) times daily. MAY TAKE AN ADDITIONAL 40 MG IF WEIGHT GAIN OF 3 LBS OR MORE OVER NIGHT  Dispense: 360 tablet; Refill: 1  10. Osteopenia of lumbar spine Weight bearing exercise  11. Morbid obesity ([place]) Discussed diet and exercise for person with BMI >25 Will recheck weight in 3-6 months    Labs pending Health Maintenance reviewed Diet and  exercise encouraged  Follow up plan: 3 months   [name] Hassell Done, FNP

## 2023-01-17 NOTE — Patient Instructions (Signed)

## 2023-01-18 LAB — CBC WITH DIFFERENTIAL/PLATELET
Basophils Absolute: 0 10*3/uL (ref 0.0–0.2)
Basos: 1 %
EOS (ABSOLUTE): 0.2 10*3/uL (ref 0.0–0.4)
Eos: 4 %
Hematocrit: 39.1 % (ref 34.0–46.6)
Hemoglobin: 12.9 g/dL (ref 11.1–15.9)
Immature Grans (Abs): 0 10*3/uL (ref 0.0–0.1)
Immature Granulocytes: 0 %
Lymphocytes Absolute: 1.6 10*3/uL (ref 0.7–3.1)
Lymphs: 38 %
MCH: 31.6 pg (ref 26.6–33.0)
MCHC: 33 g/dL (ref 31.5–35.7)
MCV: 96 fL (ref 79–97)
Monocytes Absolute: 0.4 10*3/uL (ref 0.1–0.9)
Monocytes: 10 %
Neutrophils Absolute: 1.9 10*3/uL (ref 1.4–7.0)
Neutrophils: 47 %
Platelets: 163 10*3/uL (ref 150–450)
RBC: 4.08 x10E6/uL (ref 3.77–5.28)
RDW: 12.9 % (ref 11.7–15.4)
WBC: 4.1 10*3/uL (ref 3.4–10.8)

## 2023-01-18 LAB — CMP14+EGFR
ALT: 12 IU/L (ref 0–32)
AST: 22 IU/L (ref 0–40)
Albumin/Globulin Ratio: 1.3 (ref 1.2–2.2)
Albumin: 4 g/dL (ref 3.7–4.7)
Alkaline Phosphatase: 60 IU/L (ref 44–121)
BUN/Creatinine Ratio: 14 (ref 12–28)
BUN: 14 mg/dL (ref 8–27)
Bilirubin Total: 0.6 mg/dL (ref 0.0–1.2)
CO2: 23 mmol/L (ref 20–29)
Calcium: 9 mg/dL (ref 8.7–10.3)
Chloride: 103 mmol/L (ref 96–106)
Creatinine, Ser: 0.98 mg/dL (ref 0.57–1.00)
Globulin, Total: 3 g/dL (ref 1.5–4.5)
Glucose: 146 mg/dL — ABNORMAL HIGH (ref 70–99)
Potassium: 4.1 mmol/L (ref 3.5–5.2)
Sodium: 142 mmol/L (ref 134–144)
Total Protein: 7 g/dL (ref 6.0–8.5)
eGFR: 56 mL/min/{1.73_m2} — ABNORMAL LOW (ref >59)

## 2023-01-18 LAB — LIPID PANEL
Chol/HDL Ratio: 3.2 ratio (ref 0.0–4.4)
Cholesterol, Total: 189 mg/dL (ref 100–199)
HDL: 59 mg/dL (ref >39)
LDL Chol Calc (NIH): 107 mg/dL — ABNORMAL HIGH (ref 0–99)
Triglycerides: 131 mg/dL (ref 0–149)
VLDL Cholesterol Cal: 23 mg/dL (ref 5–40)

## 2023-01-26 ENCOUNTER — Telehealth: Payer: Self-pay | Admitting: Nurse Practitioner

## 2023-01-26 DIAGNOSIS — E1142 Type 2 diabetes mellitus with diabetic polyneuropathy: Secondary | ICD-10-CM

## 2023-01-26 MED ORDER — METFORMIN HCL 500 MG PO TABS: 500.0000 mg | ORAL_TABLET | Freq: Two times a day (BID) | ORAL | 0 refills | Status: DC

## 2023-01-26 NOTE — Telephone Encounter (Signed)
  Prescription Request  01/26/2023  What is the name of the medication or equipment? METFORMIN  Have you contacted your pharmacy to request a refill? YES  Which pharmacy would you like this sent to? [facility] [place] because she is completely out and send all refills to [facility] for auto refills.

## 2023-01-26 NOTE — Telephone Encounter (Signed)
Pt aware refill for Metformin 30-d to [facility] & 90-d to [facility] All other meds were sent at her visit on 01/17/23 to [facility]

## 2023-02-17 ENCOUNTER — Other Ambulatory Visit: Payer: Self-pay | Admitting: Nurse Practitioner

## 2023-02-17 DIAGNOSIS — E1142 Type 2 diabetes mellitus with diabetic polyneuropathy: Secondary | ICD-10-CM

## 2023-03-11 ENCOUNTER — Telehealth: Payer: Self-pay | Admitting: Nurse Practitioner

## 2023-03-11 NOTE — Telephone Encounter (Signed)
Patient said she has not tried Immodium AD but she really does not want to take the Metformin any longer.  She said she has had several episodes of uncontrollable diarrhea at home and out in public, she does not eat much and gets weak because she is afraid of having diarrhea out in public.  She wants to discontinue the Metformin and wants to take something else instead.

## 2023-03-11 NOTE — Telephone Encounter (Signed)
Has she tried imodium AD

## 2023-03-11 NOTE — Telephone Encounter (Signed)
Needs video visit to discuss.

## 2023-03-11 NOTE — Telephone Encounter (Signed)
Pt called stating she has been having a lot of diarrhea and has tried all kinds of things to take to help with the diarrhea but says nothing is working. Pt says the only thing she can think of that could be causing the diarrhea is the Metformin that she is taking. Needs advise on what to do or what's best to take to stop the diarrhea.

## 2023-03-11 NOTE — Telephone Encounter (Signed)
Patient notified and appt made for video visit for Monday with PCP

## 2023-03-14 ENCOUNTER — Telehealth (INDEPENDENT_AMBULATORY_CARE_PROVIDER_SITE_OTHER): Payer: Medicare Other | Admitting: Nurse Practitioner

## 2023-03-14 ENCOUNTER — Other Ambulatory Visit: Payer: Self-pay | Admitting: Nurse Practitioner

## 2023-03-14 ENCOUNTER — Encounter: Payer: Self-pay | Admitting: Nurse Practitioner

## 2023-03-14 DIAGNOSIS — I152 Hypertension secondary to endocrine disorders: Secondary | ICD-10-CM

## 2023-03-14 DIAGNOSIS — I350 Nonrheumatic aortic (valve) stenosis: Secondary | ICD-10-CM

## 2023-03-14 DIAGNOSIS — E1159 Type 2 diabetes mellitus with other circulatory complications: Secondary | ICD-10-CM

## 2023-03-14 DIAGNOSIS — I272 Pulmonary hypertension, unspecified: Secondary | ICD-10-CM

## 2023-03-14 DIAGNOSIS — E785 Hyperlipidemia, unspecified: Secondary | ICD-10-CM

## 2023-03-14 DIAGNOSIS — I4821 Permanent atrial fibrillation: Secondary | ICD-10-CM

## 2023-03-14 DIAGNOSIS — E1169 Type 2 diabetes mellitus with other specified complication: Secondary | ICD-10-CM

## 2023-03-14 MED ORDER — SITAGLIPTIN PHOSPHATE 100 MG PO TABS: 100.0000 mg | ORAL_TABLET | Freq: Every day | ORAL | 1 refills | Status: DC

## 2023-03-14 MED ORDER — LANCET DEVICE MISC: 1.0000 | Freq: Three times a day (TID) | 0 refills | Status: AC

## 2023-03-14 MED ORDER — BLOOD GLUCOSE MONITORING SUPPL DEVI: 1.0000 | Freq: Three times a day (TID) | 0 refills | Status: DC

## 2023-03-14 MED ORDER — LANCETS MISC. MISC: 1.0000 | Freq: Three times a day (TID) | 0 refills | Status: DC

## 2023-03-14 MED ORDER — BLOOD GLUCOSE TEST VI STRP: 1.0000 | ORAL_STRIP | Freq: Three times a day (TID) | 0 refills | Status: DC

## 2023-03-14 MED ORDER — RIVAROXABAN 20 MG PO TABS: ORAL_TABLET | ORAL | 3 refills | Status: DC

## 2023-03-14 NOTE — Progress Notes (Signed)
Patient is not able to do a video visit. She is having diarrhea and wants to stop metformin. She also needs refill on xeralto.   Meds ordered this encounter  Medications   sitaGLIPtin (JANUVIA) 100 MG tablet    Sig: Take 1 tablet (100 mg total) by mouth daily.    Dispense:  30 tablet    Refill:  1    Order Specific Question:   Supervising Provider    Answer:   Arville Care A [[id number]]   rivaroxaban (XARELTO) 20 MG TABS tablet    Sig: TAKE 1 TABLET BY MOUTH EVERY DAY WITH BREAKFAST    Dispense:  90 tablet    Refill:  3    Order Specific Question:   Supervising Provider    Answer:   Nils Pyle [[id number]]   [name], FNP

## 2023-03-14 NOTE — Addendum Note (Signed)
Addended by: Cleda Daub on: 03/14/2023 09:54 AM   Modules accepted: Orders

## 2023-03-15 ENCOUNTER — Other Ambulatory Visit: Payer: Self-pay | Admitting: Nurse Practitioner

## 2023-04-04 ENCOUNTER — Encounter: Payer: Self-pay | Admitting: Nurse Practitioner

## 2023-04-04 ENCOUNTER — Ambulatory Visit (INDEPENDENT_AMBULATORY_CARE_PROVIDER_SITE_OTHER): Payer: Medicare Other | Admitting: Nurse Practitioner

## 2023-04-04 VITALS — BP 141/64 | HR 66 | Temp 97.5°F | Resp 20 | Ht 65.0 in | Wt 193.0 lb

## 2023-04-04 DIAGNOSIS — E785 Hyperlipidemia, unspecified: Secondary | ICD-10-CM | POA: Diagnosis not present

## 2023-04-04 DIAGNOSIS — I350 Nonrheumatic aortic (valve) stenosis: Secondary | ICD-10-CM

## 2023-04-04 DIAGNOSIS — I4821 Permanent atrial fibrillation: Secondary | ICD-10-CM

## 2023-04-04 DIAGNOSIS — I272 Pulmonary hypertension, unspecified: Secondary | ICD-10-CM | POA: Diagnosis not present

## 2023-04-04 DIAGNOSIS — I700 Atherosclerosis of aorta: Secondary | ICD-10-CM

## 2023-04-04 DIAGNOSIS — E1159 Type 2 diabetes mellitus with other circulatory complications: Secondary | ICD-10-CM | POA: Diagnosis not present

## 2023-04-04 DIAGNOSIS — B372 Candidiasis of skin and nail: Secondary | ICD-10-CM

## 2023-04-04 DIAGNOSIS — I152 Hypertension secondary to endocrine disorders: Secondary | ICD-10-CM

## 2023-04-04 DIAGNOSIS — E1142 Type 2 diabetes mellitus with diabetic polyneuropathy: Secondary | ICD-10-CM | POA: Diagnosis not present

## 2023-04-04 DIAGNOSIS — R600 Localized edema: Secondary | ICD-10-CM

## 2023-04-04 DIAGNOSIS — M8588 Other specified disorders of bone density and structure, other site: Secondary | ICD-10-CM

## 2023-04-04 DIAGNOSIS — E034 Atrophy of thyroid (acquired): Secondary | ICD-10-CM

## 2023-04-04 DIAGNOSIS — Z7984 Long term (current) use of oral hypoglycemic drugs: Secondary | ICD-10-CM | POA: Diagnosis not present

## 2023-04-04 DIAGNOSIS — E1169 Type 2 diabetes mellitus with other specified complication: Secondary | ICD-10-CM

## 2023-04-04 LAB — CMP14+EGFR

## 2023-04-04 LAB — LIPID PANEL

## 2023-04-04 LAB — CBC WITH DIFFERENTIAL/PLATELET
Basos: 1 %
Immature Grans (Abs): 0 10*3/uL (ref 0.0–0.1)
MCH: 31.6 pg (ref 26.6–33.0)
Monocytes: 9 %
Platelets: 179 10*3/uL (ref 150–450)

## 2023-04-04 LAB — BAYER DCA HB A1C WAIVED: HB A1C (BAYER DCA - WAIVED): 6.5 % — ABNORMAL HIGH (ref 4.8–5.6)

## 2023-04-04 MED ORDER — NYSTATIN 100000 UNIT/GM EX CREA
1.0000 | TOPICAL_CREAM | Freq: Two times a day (BID) | CUTANEOUS | 1 refills | Status: AC
Start: 2023-04-04 — End: ?

## 2023-04-04 MED ORDER — GABAPENTIN 300 MG PO CAPS
300.0000 mg | ORAL_CAPSULE | Freq: Two times a day (BID) | ORAL | 1 refills | Status: DC
Start: 2023-04-04 — End: 2023-10-10

## 2023-04-04 MED ORDER — RIVAROXABAN 20 MG PO TABS
ORAL_TABLET | ORAL | 1 refills | Status: DC
Start: 2023-04-04 — End: 2023-10-10

## 2023-04-04 MED ORDER — FUROSEMIDE 40 MG PO TABS
80.0000 mg | ORAL_TABLET | Freq: Two times a day (BID) | ORAL | 1 refills | Status: DC
Start: 2023-04-04 — End: 2023-05-30

## 2023-04-04 MED ORDER — METOPROLOL SUCCINATE ER 25 MG PO TB24: 25.0000 mg | ORAL_TABLET | Freq: Every day | ORAL | 1 refills | Status: DC

## 2023-04-04 MED ORDER — METFORMIN HCL 500 MG PO TABS: 500.0000 mg | ORAL_TABLET | Freq: Two times a day (BID) | ORAL | 1 refills | Status: DC

## 2023-04-04 MED ORDER — LEVOTHYROXINE SODIUM 125 MCG PO TABS: 125.0000 ug | ORAL_TABLET | Freq: Every day | ORAL | 1 refills | Status: DC

## 2023-04-04 NOTE — Progress Notes (Signed)
Subjective:    Patient ID: Debra Campbell, female    DOB: 1932-04-19, 89 y.o.   MRN: [id number]   Chief Complaint: medical management of chronic issues     HPI:  Debra Campbell is a 89 y.o. who identifies as a female who was assigned female at birth.   Social history: Lives with: husband Work history: retired   Water engineer in today for follow up of the following chronic medical issues:  1. Aortic calcification (HCC) 2. Mild aortic stenosis by prior echocardiogram 3. Pulmonary hypertension, unspecified (HCC) 4. Permanent atrial fibrillation (HCC) CHA2DS2Vasc = 5; Xarelto Patient last saw cardiology on 01/16/23. I personally reviewed office note and there was no change to plan of care. Patient is to follow  up in 9 months.  5. Hypertension associated with type 2 diabetes mellitus (HCC) No c/o chest pain, sob or headache. Does not check blood pressure at home. BP Readings from Last 3 Encounters:  01/17/23 (!) 140/81  01/06/23 128/78  10/19/22 136/87     6. Hyperlipidemia associated with type 2 diabetes mellitus (HCC) Eats whatever family and friends bring her to eat. Lab Results  Component Value Date   CHOL 189 01/17/2023   HDL 59 01/17/2023   LDLCALC 107 (H) 01/17/2023   TRIG 131 01/17/2023   CHOLHDL 3.2 01/17/2023     7. Type 2 diabetes mellitus with diabetic polyneuropathy, without long-term current use of insulin (HCC) Does not check blood sugars very often at home. She was recently put on januvia and she says that causes body aches and constipation. She stopped taking it and started back on her metformin. She only took [place] for a couple of days. Lab Results  Component Value Date   HGBA1C 7.5 (H) 01/17/2023     8. Diabetic polyneuropathy associated with type 2 diabetes mellitus (HCC) Has some burning in bil feet but no numbness.  9. Hypothyroidism due to acquired atrophy of thyroid No problems that she is aware of. Lab Results  Component Value Date   TSH  0.946 07/19/2022     10. Osteopenia of lumbar spine Last dexascan was done on 01/21/21. Her t scare was -2.0. will do no more.  11. Peripheral edema Has edema by the end of the day because she sits all day long.  12. Morbid obesity (HCC) No recent weight changes.   New complaints: None today   Allergies  Allergen Reactions   Ace Inhibitors Cough   Iohexol      Desc: HIVES 40 YEARS AGO    Statins     myalgia   Penicillins Swelling and Rash    Has patient had a PCN reaction causing immediate rash, facial/tongue/throat swelling, SOB or lightheadedness with hypotension: Yes Has patient had a PCN reaction causing severe rash involving mucus membranes or skin necrosis: No Has patient had a PCN reaction that required hospitalization Yes Has patient had a PCN reaction occurring within the last 10 years: No If all of the above answers are "NO", then may proceed with Cephalosporin use.    Sulfa Antibiotics Rash   Outpatient Encounter Medications as of 04/04/2023  Medication Sig   Accu-Chek Softclix Lancets lancets Check BS daily Dx E11.42   acetaminophen (TYLENOL) 500 MG tablet Take 1 tablet (500 mg total) by mouth every 8 (eight) hours as needed.   Blood Glucose Monitoring Suppl (ACCU-CHEK GUIDE ME) w/Device KIT TEST BS TID DX E11.42   furosemide (LASIX) 40 MG tablet Take 2 tablets (80 mg total)  by mouth 2 (two) times daily. MAY TAKE AN ADDITIONAL 40 MG IF WEIGHT GAIN OF 3 LBS OR MORE OVER NIGHT   gabapentin (NEURONTIN) 300 MG capsule Take 1 capsule (300 mg total) by mouth 2 (two) times daily.   glucose blood (ACCU-CHEK GUIDE) test strip TEST BS TID DX E11.42   Lancet Device MISC 1 each by Does not apply route in the morning, at noon, and at bedtime. May substitute to any manufacturer covered by patient's insurance.   levothyroxine (SYNTHROID) 125 MCG tablet Take 1 tablet (125 mcg total) by mouth daily.   metoprolol succinate (TOPROL-XL) 25 MG 24 hr tablet Take 1 tablet (25 mg total)  by mouth daily.   nystatin cream (MYCOSTATIN) Apply 1 Application topically 2 (two) times daily.   rivaroxaban (XARELTO) 20 MG TABS tablet TAKE 1 TABLET BY MOUTH EVERY DAY WITH BREAKFAST   sitaGLIPtin (JANUVIA) 100 MG tablet Take 1 tablet (100 mg total) by mouth daily.   No facility-administered encounter medications on file as of 04/04/2023.    Past Surgical History:  Procedure Laterality Date   ABDOMINAL HYSTERECTOMY Bilateral 06/26/2013   Procedure: TOTAL ABDOMINAL HYSTERECTOMY WITH BILATERAL SALPINGO OOPHERECTOMY WITH PELVIC LYMPHADNECTOMY;  Surgeon: Jeannette Corpus, MD;  Location: [facility];  Service: Gynecology;  Laterality: Bilateral;   BACK SURGERY  06/26/2013   ECTOPIC PREGNANCY SURGERY     HERNIA REPAIR     HYSTEROSCOPY WITH D & C N/A 04/26/2013   Procedure: DILATATION AND CURETTAGE /HYSTEROSCOPY;  Surgeon: Adam Phenix, MD;  Location: [facility];  Service: Gynecology;  Laterality: N/A;   JOINT REPLACEMENT     LAPAROTOMY     for ectopic pregnancy   LAPAROTOMY N/A 06/26/2013   Procedure: EXPLORATORY LAPAROTOMY;  Surgeon: Jeannette Corpus, MD;  Location: [facility];  Service: Gynecology;  Laterality: N/A;   lt knee arthroscopy     rt total hip replacement     TONSILLECTOMY     TOTAL KNEE ARTHROPLASTY Left 12/22/2016   Procedure: LEFT TOTAL KNEE ARTHROPLASTY;  Surgeon: Ranee Gosselin, MD;  Location: [facility];  Service: Orthopedics;  Laterality: Left;   TRANSTHORACIC ECHOCARDIOGRAM  09/2018   EF 60-65%. Mild AS.  Mod-Severe PAH 60 mmHg   WISDOM TOOTH EXTRACTION      Family History  Problem Relation Age of Onset   Heart disease Father    Thyroid disease Father    Heart attack Father    Heart disease Sister        open heart surgery   Cancer Sister        breast   Neuropathy Sister        secondary to cancer treatment   Breast cancer Sister    Suicidality Brother 87   Arthritis Mother    Cancer Daughter    Breast cancer Daughter       Controlled substance  contract: n/a     Review of Systems  Constitutional:  Negative for diaphoresis.  Eyes:  Negative for pain.  Respiratory:  Negative for shortness of breath.   Cardiovascular:  Negative for chest pain, palpitations and leg swelling.  Gastrointestinal:  Negative for abdominal pain.  Endocrine: Negative for polydipsia.  Skin:  Negative for rash.  Neurological:  Negative for dizziness, weakness and headaches.  Hematological:  Does not bruise/bleed easily.  All other systems reviewed and are negative.      Objective:   Physical Exam Vitals and nursing note reviewed.  Constitutional:      General: She  is not in acute distress.    Appearance: Normal appearance. She is well-developed.  HENT:     Head: Normocephalic.     Right Ear: Tympanic membrane normal.     Left Ear: Tympanic membrane normal.     Nose: Nose normal.     Mouth/Throat:     Mouth: Mucous membranes are moist.  Eyes:     Pupils: Pupils are equal, round, and reactive to light.  Neck:     Vascular: No carotid bruit or JVD.  Cardiovascular:     Rate and Rhythm: Normal rate and regular rhythm.     Heart sounds: Normal heart sounds. Murmur: 3/6.  Pulmonary:     Effort: Pulmonary effort is normal. No respiratory distress.     Breath sounds: Normal breath sounds. No wheezing or rales.  Chest:     Chest wall: No tenderness.  Abdominal:     General: Bowel sounds are normal. There is no distension or abdominal bruit.     Palpations: Abdomen is soft. There is no hepatomegaly, splenomegaly, mass or pulsatile mass.     Tenderness: There is no abdominal tenderness.  Musculoskeletal:        General: Normal range of motion.     Cervical back: Normal range of motion and neck supple.     Right lower leg: Edema (1+) present.     Left lower leg: Edema (1+) present.  Lymphadenopathy:     Cervical: No cervical adenopathy.  Skin:    General: Skin is warm and dry.  Neurological:     Mental Status: She is alert and oriented to  person, place, and time.     Deep Tendon Reflexes: Reflexes are normal and symmetric.  Psychiatric:        Behavior: Behavior normal.        Thought Content: Thought content normal.        Judgment: Judgment normal.     BP (!) 141/64   Pulse 66   Temp (!) 97.5 F (36.4 C) (Temporal)   Resp 20   Ht 5\' 5"  (1.651 m)   Wt 193 lb (87.5 kg)   SpO2 97%   BMI 32.12 kg/m   HGBA1c 6.5%      Assessment & Plan:   Debra Campbell comes in today with chief complaint of Medical Management of Chronic Issues   Diagnosis and orders addressed:  1. Aortic calcification (HCC) 2. Mild aortic stenosis by prior echocardiogram - rivaroxaban (XARELTO) 20 MG TABS tablet; TAKE 1 TABLET BY MOUTH EVERY DAY WITH BREAKFAST  Dispense: 90 tablet; Refill: 1  3. Pulmonary hypertension, unspecified (HCC) - rivaroxaban (XARELTO) 20 MG TABS tablet; TAKE 1 TABLET BY MOUTH EVERY DAY WITH BREAKFAST  Dispense: 90 tablet; Refill: 1  4. Permanent atrial fibrillation (HCC) CHA2DS2Vasc = 5; Xarelto - rivaroxaban (XARELTO) 20 MG TABS tablet; TAKE 1 TABLET BY MOUTH EVERY DAY WITH BREAKFAST  Dispense: 90 tablet; Refill: 1  5. Hypertension associated with type 2 diabetes mellitus (HCC) Low sodium diet - CBC with Differential/Platelet - CMP14+EGFR - rivaroxaban (XARELTO) 20 MG TABS tablet; TAKE 1 TABLET BY MOUTH EVERY DAY WITH BREAKFAST  Dispense: 90 tablet; Refill: 1 - metoprolol succinate (TOPROL-XL) 25 MG 24 hr tablet; Take 1 tablet (25 mg total) by mouth daily.  Dispense: 90 tablet; Refill: 1  6. Hyperlipidemia associated with type 2 diabetes mellitus (HCC) Low fat diet - Lipid panel - rivaroxaban (XARELTO) 20 MG TABS tablet; TAKE 1 TABLET BY MOUTH EVERY DAY WITH  BREAKFAST  Dispense: 90 tablet; Refill: 1  7. Type 2 diabetes mellitus with diabetic polyneuropathy, without long-term current use of insulin (HCC) Continue to watch carbs in diet - Bayer DCA Hb A1c Waived - metFORMIN (GLUCOPHAGE) 500 MG tablet;  Take 1 tablet (500 mg total) by mouth 2 (two) times daily with a meal.  Dispense: 180 tablet; Refill: 1  8. Diabetic polyneuropathy associated with type 2 diabetes mellitus (HCC) Do not go barefooted - gabapentin (NEURONTIN) 300 MG capsule; Take 1 capsule (300 mg total) by mouth 2 (two) times daily.  Dispense: 180 capsule; Refill: 1  9. Hypothyroidism due to acquired atrophy of thyroid Labs pending - levothyroxine (SYNTHROID) 125 MCG tablet; Take 1 tablet (125 mcg total) by mouth daily.  Dispense: 90 tablet; Refill: 1  10. Osteopenia of lumbar spine Weight bearing exercises  11. Peripheral edema Elevate legs when sitting - furosemide (LASIX) 40 MG tablet; Take 2 tablets (80 mg total) by mouth 2 (two) times daily. MAY TAKE AN ADDITIONAL 40 MG IF WEIGHT GAIN OF 3 LBS OR MORE OVER NIGHT  Dispense: 360 tablet; Refill: 1  12. Morbid obesity (HCC) Discussed diet and exercise for person with BMI >25 Will recheck weight in 3-6 months   13. Cutaneous candidiasis - nystatin cream (MYCOSTATIN); Apply 1 Application topically 2 (two) times daily.  Dispense: 30 g; Refill: 1   Labs pending Health Maintenance reviewed Diet and exercise encouraged  Follow up plan: 6 months   [name], FNP

## 2023-04-04 NOTE — Patient Instructions (Signed)
Fall Prevention in the Home, Adult Falls can cause injuries and can happen to people of all ages. There are many things you can do to make your home safer and to help prevent falls. What actions can I take to prevent falls? General information Use good lighting in all rooms. Make sure to: Replace any light bulbs that burn out. Turn on the lights in dark areas and use night-lights. Keep items that you use often in easy-to-reach places. Lower the shelves around your home if needed. Move furniture so that there are clear paths around it. Do not use throw rugs or other things on the floor that can make you trip. If any of your floors are uneven, fix them. Add color or contrast paint or tape to clearly mark and help you see: Grab bars or handrails. First and last steps of staircases. Where the edge of each step is. If you use a ladder or stepladder: Make sure that it is fully opened. Do not climb a closed ladder. Make sure the sides of the ladder are locked in place. Have someone hold the ladder while you use it. Know where your pets are as you move through your home. What can I do in the bathroom?     Keep the floor dry. Clean up any water on the floor right away. Remove soap buildup in the bathtub or shower. Buildup makes bathtubs and showers slippery. Use non-skid mats or decals on the floor of the bathtub or shower. Attach bath mats securely with double-sided, non-slip rug tape. If you need to sit down in the shower, use a non-slip stool. Install grab bars by the toilet and in the bathtub and shower. Do not use towel bars as grab bars. What can I do in the bedroom? Make sure that you have a light by your bed that is easy to reach. Do not use any sheets or blankets on your bed that hang to the floor. Have a firm chair or bench with side arms that you can use for support when you get dressed. What can I do in the kitchen? Clean up any spills right away. If you need to reach something  above you, use a step stool with a grab bar. Keep electrical cords out of the way. Do not use floor polish or wax that makes floors slippery. What can I do with my stairs? Do not leave anything on the stairs. Make sure that you have a light switch at the top and the bottom of the stairs. Make sure that there are handrails on both sides of the stairs. Fix handrails that are broken or loose. Install non-slip stair treads on all your stairs if they do not have carpet. Avoid having throw rugs at the top or bottom of the stairs. Choose a carpet that does not hide the edge of the steps on the stairs. Make sure that the carpet is firmly attached to the stairs. Fix carpet that is loose or worn. What can I do on the outside of my home? Use bright outdoor lighting. Fix the edges of walkways and driveways and fix any cracks. Clear paths of anything that can make you trip, such as tools or rocks. Add color or contrast paint or tape to clearly mark and help you see anything that might make you trip as you walk through a door, such as a raised step or threshold. Trim any bushes or trees on paths to your home. Check to see if handrails are loose   or broken and that both sides of all steps have handrails. Install guardrails along the edges of any raised decks and porches. Have leaves, snow, or ice cleared regularly. Use sand, salt, or ice melter on paths if you live where there is ice and snow during the winter. Clean up any spills in your garage right away. This includes grease or oil spills. What other actions can I take? Review your medicines with your doctor. Some medicines can cause dizziness or changes in blood pressure, which increase your risk of falling. Wear shoes that: Have a low heel. Do not wear high heels. Have rubber bottoms and are closed at the toe. Feel good on your feet and fit well. Use tools that help you move around if needed. These include: Canes. Walkers. Scooters. Crutches. Ask  your doctor what else you can do to help prevent falls. This may include seeing a physical therapist to learn to do exercises to move better and get stronger. Where to find more information Centers for Disease Control and Prevention, [employer]: cdc.gov National Institute on Aging: nia.nih.gov National Institute on Aging: nia.nih.gov Contact a doctor if: You are afraid of falling at home. You feel weak, drowsy, or dizzy at home. You fall at home. Get help right away if you: Lose consciousness or have trouble moving after a fall. Have a fall that causes a head injury. These symptoms may be an emergency. Get help right away. Call 911. Do not wait to see if the symptoms will go away. Do not drive yourself to the hospital. This information is not intended to replace advice given to you by your health care provider. Make sure you discuss any questions you have with your health care provider. Document Revised: 07/19/2022 Document Reviewed: 07/19/2022 Elsevier Patient Education  2023 Elsevier Inc.  

## 2023-04-05 LAB — CMP14+EGFR
ALT: 12 IU/L (ref 0–32)
Albumin/Globulin Ratio: 1.4 (ref 1.2–2.2)
CO2: 22 mmol/L (ref 20–29)
Calcium: 9.3 mg/dL (ref 8.7–10.3)
Chloride: 102 mmol/L (ref 96–106)
Creatinine, Ser: 1.09 mg/dL — ABNORMAL HIGH (ref 0.57–1.00)
Glucose: 147 mg/dL — ABNORMAL HIGH (ref 70–99)
Potassium: 4.3 mmol/L (ref 3.5–5.2)
Sodium: 141 mmol/L (ref 134–144)
Total Protein: 6.8 g/dL (ref 6.0–8.5)
eGFR: 49 mL/min/{1.73_m2} — ABNORMAL LOW (ref >59)

## 2023-04-05 LAB — CBC WITH DIFFERENTIAL/PLATELET
Basophils Absolute: 0.1 10*3/uL (ref 0.0–0.2)
EOS (ABSOLUTE): 0.3 10*3/uL (ref 0.0–0.4)
Eos: 6 %
Hematocrit: 38.2 % (ref 34.0–46.6)
Hemoglobin: 12.7 g/dL (ref 11.1–15.9)
Immature Granulocytes: 0 %
Lymphocytes Absolute: 1.4 10*3/uL (ref 0.7–3.1)
Lymphs: 30 %
MCHC: 33.2 g/dL (ref 31.5–35.7)
MCV: 95 fL (ref 79–97)
Monocytes Absolute: 0.4 10*3/uL (ref 0.1–0.9)
Neutrophils Absolute: 2.4 10*3/uL (ref 1.4–7.0)
Neutrophils: 54 %
RBC: 4.02 x10E6/uL (ref 3.77–5.28)
RDW: 13.5 % (ref 11.7–15.4)
WBC: 4.5 10*3/uL (ref 3.4–10.8)

## 2023-04-05 LAB — LIPID PANEL
Chol/HDL Ratio: 3.2 ratio (ref 0.0–4.4)
LDL Chol Calc (NIH): 112 mg/dL — ABNORMAL HIGH (ref 0–99)
Triglycerides: 117 mg/dL (ref 0–149)

## 2023-04-15 ENCOUNTER — Ambulatory Visit: Payer: Medicare Other | Admitting: Nurse Practitioner

## 2023-05-28 ENCOUNTER — Other Ambulatory Visit: Payer: Self-pay | Admitting: Nurse Practitioner

## 2023-05-28 DIAGNOSIS — R600 Localized edema: Secondary | ICD-10-CM

## 2023-10-04 ENCOUNTER — Ambulatory Visit: Payer: Medicare Other | Admitting: Nurse Practitioner

## 2023-10-10 ENCOUNTER — Encounter: Payer: Self-pay | Admitting: Nurse Practitioner

## 2023-10-10 ENCOUNTER — Ambulatory Visit: Payer: Medicare Other | Admitting: Nurse Practitioner

## 2023-10-10 VITALS — BP 148/73 | HR 83 | Temp 97.3°F | Resp 20 | Ht 65.0 in | Wt 198.0 lb

## 2023-10-10 DIAGNOSIS — E785 Hyperlipidemia, unspecified: Secondary | ICD-10-CM

## 2023-10-10 DIAGNOSIS — I4821 Permanent atrial fibrillation: Secondary | ICD-10-CM | POA: Diagnosis not present

## 2023-10-10 DIAGNOSIS — Z23 Encounter for immunization: Secondary | ICD-10-CM | POA: Diagnosis not present

## 2023-10-10 DIAGNOSIS — I152 Hypertension secondary to endocrine disorders: Secondary | ICD-10-CM | POA: Diagnosis not present

## 2023-10-10 DIAGNOSIS — E119 Type 2 diabetes mellitus without complications: Secondary | ICD-10-CM

## 2023-10-10 DIAGNOSIS — E1169 Type 2 diabetes mellitus with other specified complication: Secondary | ICD-10-CM

## 2023-10-10 DIAGNOSIS — E1159 Type 2 diabetes mellitus with other circulatory complications: Secondary | ICD-10-CM

## 2023-10-10 DIAGNOSIS — C541 Malignant neoplasm of endometrium: Secondary | ICD-10-CM

## 2023-10-10 DIAGNOSIS — E034 Atrophy of thyroid (acquired): Secondary | ICD-10-CM | POA: Diagnosis not present

## 2023-10-10 DIAGNOSIS — E1142 Type 2 diabetes mellitus with diabetic polyneuropathy: Secondary | ICD-10-CM

## 2023-10-10 DIAGNOSIS — Z7984 Long term (current) use of oral hypoglycemic drugs: Secondary | ICD-10-CM

## 2023-10-10 DIAGNOSIS — R600 Localized edema: Secondary | ICD-10-CM

## 2023-10-10 DIAGNOSIS — M8588 Other specified disorders of bone density and structure, other site: Secondary | ICD-10-CM

## 2023-10-10 LAB — BAYER DCA HB A1C WAIVED: HB A1C (BAYER DCA - WAIVED): 6.2 % — ABNORMAL HIGH (ref 4.8–5.6)

## 2023-10-10 MED ORDER — FUROSEMIDE 40 MG PO TABS: 80.0000 mg | ORAL_TABLET | Freq: Two times a day (BID) | ORAL | 1 refills | Status: DC

## 2023-10-10 MED ORDER — METOLAZONE 2.5 MG PO TABS: ORAL_TABLET | ORAL | 0 refills | Status: DC

## 2023-10-10 MED ORDER — DOXYCYCLINE HYCLATE 100 MG PO TABS: 100.0000 mg | ORAL_TABLET | Freq: Two times a day (BID) | ORAL | 0 refills | Status: DC

## 2023-10-10 MED ORDER — METFORMIN HCL 500 MG PO TABS: 500.0000 mg | ORAL_TABLET | Freq: Two times a day (BID) | ORAL | 1 refills | Status: DC

## 2023-10-10 MED ORDER — METOPROLOL SUCCINATE ER 25 MG PO TB24: 25.0000 mg | ORAL_TABLET | Freq: Every day | ORAL | 1 refills | Status: DC

## 2023-10-10 MED ORDER — RIVAROXABAN 20 MG PO TABS: ORAL_TABLET | ORAL | 1 refills | Status: DC

## 2023-10-10 MED ORDER — LEVOTHYROXINE SODIUM 125 MCG PO TABS: 125.0000 ug | ORAL_TABLET | Freq: Every day | ORAL | 1 refills | Status: DC

## 2023-10-10 MED ORDER — GABAPENTIN 300 MG PO CAPS: 300.0000 mg | ORAL_CAPSULE | Freq: Two times a day (BID) | ORAL | 1 refills | Status: DC

## 2023-10-10 NOTE — Progress Notes (Signed)
Subjective:    Patient ID: Debra Campbell, female    DOB: 1932/10/18, 89 y.o.   MRN: [id number]   Chief Complaint: medical management of chronic issues     HPI:  Debra Campbell is a 89 y.o. who identifies as a female who was assigned female at birth.   Social history: Lives with: husband Work history: retired   Water engineer in today for follow up of the following chronic medical issues:  1. Hypertension associated with type 2 diabetes mellitus (HCC) No c/o chest pain, sob or headache. Does not check blood presure at home. BP Readings from Last 3 Encounters:  04/04/23 (!) 141/64  01/17/23 (!) 140/81  01/06/23 128/78     2. Permanent atrial fibrillation (HCC) CHA2DS2Vasc = 5; Xarelto No palpitations or heart racing  3. Diabetes mellitus treated with oral medication (HCC) Does not check blood sugars very often at home. Lab Results  Component Value Date   HGBA1C 6.5 (H) 04/04/2023     4. Diabetic polyneuropathy associated with type 2 diabetes mellitus (HCC) Has some numbness in toes  5. Hyperlipidemia associated with type 2 diabetes mellitus (HCC) Does not really watch diet and does no dedicated exercise Lab Results  Component Value Date   CHOL 194 04/04/2023   HDL 61 04/04/2023   LDLCALC 112 (H) 04/04/2023   TRIG 117 04/04/2023   CHOLHDL 3.2 04/04/2023     6. Hypothyroidism due to acquired atrophy of thyroid No issues that she is aware Lab Results  Component Value Date   TSH 0.946 07/19/2022     7. Peripheral edema Has edema daiLy  8. Endometrial cancer (HCC) No reoccurrence. No longer sees oncology  9. Osteopenia of lumbar spine Last dexascan was done on 01/21/21. T score was -2.0  10. Morbid obesity (HCC) No recent weight changes Wt Readings from Last 3 Encounters:  10/10/23 198 lb (89.8 kg)  04/04/23 193 lb (87.5 kg)  01/06/23 200 lb 9.6 oz (91 kg)   BMI Readings from Last 3 Encounters:  10/10/23 32.95 kg/m  04/04/23 32.12 kg/m  01/06/23  33.38 kg/m      New complaints: None today  Allergies  Allergen Reactions   Ace Inhibitors Cough   Iohexol      Desc: HIVES 40 YEARS AGO    Januvia [Sitagliptin] Other (See Comments)    Constipation and body aches   Statins     myalgia   Penicillins Swelling and Rash    Has patient had a PCN reaction causing immediate rash, facial/tongue/throat swelling, SOB or lightheadedness with hypotension: Yes Has patient had a PCN reaction causing severe rash involving mucus membranes or skin necrosis: No Has patient had a PCN reaction that required hospitalization Yes Has patient had a PCN reaction occurring within the last 10 years: No If all of the above answers are "NO", then may proceed with Cephalosporin use.    Sulfa Antibiotics Rash   Outpatient Encounter Medications as of 10/10/2023  Medication Sig   Accu-Chek Softclix Lancets lancets Check BS daily Dx E11.42   acetaminophen (TYLENOL) 500 MG tablet Take 1 tablet (500 mg total) by mouth every 8 (eight) hours as needed.   Blood Glucose Monitoring Suppl (ACCU-CHEK GUIDE ME) w/Device KIT TEST BS TID DX E11.42   furosemide (LASIX) 40 MG tablet TAKE 2 TABLETS TWICE A DAY.MAY TAKE AN ADDITIONAL     1 TABLET IF WEIGHT GAIN OF 3LBS OR MORE OVERNIGHT.   gabapentin (NEURONTIN) 300 MG capsule Take 1 capsule (  300 mg total) by mouth 2 (two) times daily.   glucose blood (ACCU-CHEK GUIDE) test strip TEST BS TID DX E11.42   levothyroxine (SYNTHROID) 125 MCG tablet Take 1 tablet (125 mcg total) by mouth daily.   metFORMIN (GLUCOPHAGE) 500 MG tablet Take 1 tablet (500 mg total) by mouth 2 (two) times daily with a meal.   metoprolol succinate (TOPROL-XL) 25 MG 24 hr tablet Take 1 tablet (25 mg total) by mouth daily.   nystatin cream (MYCOSTATIN) Apply 1 Application topically 2 (two) times daily.   rivaroxaban (XARELTO) 20 MG TABS tablet TAKE 1 TABLET BY MOUTH EVERY DAY WITH BREAKFAST   No facility-administered encounter medications on file as of  10/10/2023.    Past Surgical History:  Procedure Laterality Date   ABDOMINAL HYSTERECTOMY Bilateral 06/26/2013   Procedure: TOTAL ABDOMINAL HYSTERECTOMY WITH BILATERAL SALPINGO OOPHERECTOMY WITH PELVIC LYMPHADNECTOMY;  Surgeon: Jeannette Corpus, MD;  Location: [facility];  Service: Gynecology;  Laterality: Bilateral;   BACK SURGERY  06/26/2013   ECTOPIC PREGNANCY SURGERY     HERNIA REPAIR     HYSTEROSCOPY WITH D & C N/A 04/26/2013   Procedure: DILATATION AND CURETTAGE /HYSTEROSCOPY;  Surgeon: Adam Phenix, MD;  Location: [facility];  Service: Gynecology;  Laterality: N/A;   JOINT REPLACEMENT     LAPAROTOMY     for ectopic pregnancy   LAPAROTOMY N/A 06/26/2013   Procedure: EXPLORATORY LAPAROTOMY;  Surgeon: Jeannette Corpus, MD;  Location: [facility];  Service: Gynecology;  Laterality: N/A;   lt knee arthroscopy     rt total hip replacement     TONSILLECTOMY     TOTAL KNEE ARTHROPLASTY Left 12/22/2016   Procedure: LEFT TOTAL KNEE ARTHROPLASTY;  Surgeon: Ranee Gosselin, MD;  Location: [facility];  Service: Orthopedics;  Laterality: Left;   TRANSTHORACIC ECHOCARDIOGRAM  09/2018   EF 60-65%. Mild AS.  Mod-Severe PAH 60 mmHg   WISDOM TOOTH EXTRACTION      Family History  Problem Relation Age of Onset   Heart disease Father    Thyroid disease Father    Heart attack Father    Heart disease Sister        open heart surgery   Cancer Sister        breast   Neuropathy Sister        secondary to cancer treatment   Breast cancer Sister    Suicidality Brother 98   Arthritis Mother    Cancer Daughter    Breast cancer Daughter       Controlled substance contract: n/a     Review of Systems     Objective:   Physical Exam Vitals and nursing note reviewed.  Constitutional:      General: She is not in acute distress.    Appearance: Normal appearance. She is well-developed.  HENT:     Head: Normocephalic.     Right Ear: Tympanic membrane normal.     Left Ear: Tympanic membrane  normal.     Nose: Nose normal.     Mouth/Throat:     Mouth: Mucous membranes are moist.  Eyes:     Pupils: Pupils are equal, round, and reactive to light.  Neck:     Vascular: No carotid bruit or JVD.  Cardiovascular:     Rate and Rhythm: Normal rate. Rhythm irregular.     Heart sounds: Murmur (3/6) heard.  Pulmonary:     Effort: Pulmonary effort is normal. No respiratory distress.     Breath sounds:  Normal breath sounds. No wheezing or rales.  Chest:     Chest wall: No tenderness.  Abdominal:     General: Bowel sounds are normal. There is no distension or abdominal bruit.     Palpations: Abdomen is soft. There is no hepatomegaly, splenomegaly, mass or pulsatile mass.     Tenderness: There is no abdominal tenderness.  Musculoskeletal:        General: Normal range of motion.     Cervical back: Normal range of motion and neck supple.     Right lower leg: Edema (3+) present.     Left lower leg: Edema (3+) present.  Lymphadenopathy:     Cervical: No cervical adenopathy.  Skin:    General: Skin is warm and dry.     Coloration: Skin is pale.  Neurological:     Mental Status: She is alert and oriented to person, place, and time.     Deep Tendon Reflexes: Reflexes are normal and symmetric.  Psychiatric:        Behavior: Behavior normal.        Thought Content: Thought content normal.        Judgment: Judgment normal.     BP (!) 148/73   Pulse 83   Temp (!) 97.3 F (36.3 C) (Temporal)   Resp 20   Ht 5\' 5"  (1.651 m)   Wt 198 lb (89.8 kg)   SpO2 96%   BMI 32.95 kg/m        Assessment & Plan:  Debra Campbell comes in today with chief complaint of Medical Management of Chronic Issues   Diagnosis and orders addressed:  1. Hypertension associated with type 2 diabetes mellitus (HCC) Low sodium diet - CBC with Differential/Platelet - CMP14+EGFR - rivaroxaban (XARELTO) 20 MG TABS tablet; TAKE 1 TABLET BY MOUTH EVERY DAY WITH BREAKFAST  Dispense: 90 tablet; Refill: 1 -  metoprolol succinate (TOPROL-XL) 25 MG 24 hr tablet; Take 1 tablet (25 mg total) by mouth daily.  Dispense: 90 tablet; Refill: 1  2. Permanent atrial fibrillation (HCC) CHA2DS2Vasc = 5; Xarelto Avoid caffeine - rivaroxaban (XARELTO) 20 MG TABS tablet; TAKE 1 TABLET BY MOUTH EVERY DAY WITH BREAKFAST  Dispense: 90 tablet; Refill: 1  3. Diabetes mellitus treated with oral medication (HCC) Continue to watch carbs in diet - Bayer DCA Hb A1c Waived - metFORMIN (GLUCOPHAGE) 500 MG tablet; Take 1 tablet (500 mg total) by mouth 2 (two) times daily with a meal.  Dispense: 180 tablet; Refill: 1  4. Diabetic polyneuropathy associated with type 2 diabetes mellitus (HCC) Do not go barefooted - gabapentin (NEURONTIN) 300 MG capsule; Take 1 capsule (300 mg total) by mouth 2 (two) times daily.  Dispense: 180 capsule; Refill: 1  5. Hyperlipidemia associated with type 2 diabetes mellitus (HCC) Low fat diet - Lipid panel - rivaroxaban (XARELTO) 20 MG TABS tablet; TAKE 1 TABLET BY MOUTH EVERY DAY WITH BREAKFAST  Dispense: 90 tablet; Refill: 1  6. Hypothyroidism due to acquired atrophy of thyroid Labs pending - levothyroxine (SYNTHROID) 125 MCG tablet; Take 1 tablet (125 mcg total) by mouth daily.  Dispense: 90 tablet; Refill: 1  7. Peripheral edema Take zaroxlyn for 2 days - metolazone (ZAROXOLYN) 2.5 MG tablet; 1 po for 2 days then prn  Dispense: 20 tablet; Refill: 0 - Apply unna boot - furosemide (LASIX) 40 MG tablet; Take 2 tablets (80 mg total) by mouth 2 (two) times daily. TAKE 2 TABLETS TWICE A DAY.MAY TAKE AN ADDITIONAL  1 TABLET IF WEIGHT GAIN OF 3LBS OR MORE OVERNIGHT.  Dispense: 360 tablet; Refill: 1  8. Endometrial cancer (HCC)  9. Osteopenia of lumbar spine Weight bearing exercise  10. Morbid obesity (HCC) Discussed diet and exercise for person with BMI >25 Will recheck weight in 3-6 months    Labs pending Health Maintenance reviewed Diet and exercise encouraged  Follow up  plan: 3 days   [name], FNP

## 2023-10-10 NOTE — Addendum Note (Signed)
Addended by: Bennie Pierini on: 10/10/2023 11:59 AM   Modules accepted: Level of Service

## 2023-10-10 NOTE — Patient Instructions (Signed)
Peripheral Edema  Peripheral edema is swelling that is caused by a buildup of fluid. Peripheral edema most often affects the lower legs, ankles, and feet. It can also develop in the arms, hands, and face. The area of the body that has peripheral edema will look swollen. It may also feel heavy or warm. Your clothes may start to feel tight. Pressing on the area may make a temporary dent in your skin (pitting edema). You may not be able to move your swollen arm or leg as much as usual. There are many causes of peripheral edema. It can happen because of a complication of other conditions such as heart failure, kidney disease, or a problem with your circulation. It also can be a side effect of certain medicines or happen because of an infection. It often happens to women during pregnancy. Sometimes, the cause is not known. Follow these instructions at home: Managing pain, stiffness, and swelling  Raise (elevate) your legs while you are sitting or lying down. Move around often to prevent stiffness and to reduce swelling. Do not sit or stand for long periods of time. Do not wear tight clothing. Do not wear garters on your upper legs. Exercise your legs to get your circulation going. This helps to move the fluid back into your blood vessels, and it may help the swelling go down. Wear compression stockings as told by your health care provider. These stockings help to prevent blood clots and reduce swelling in your legs. It is important that these are the correct size. These stockings should be prescribed by your doctor to prevent possible injuries. If elastic bandages or wraps are recommended, use them as told by your health care provider. Medicines Take over-the-counter and prescription medicines only as told by your health care provider. Your health care provider may prescribe medicine to help your body get rid of excess water (diuretic). Take this medicine if you are told to take it. General  instructions Eat a low-salt (low-sodium) diet as told by your health care provider. Sometimes, eating less salt may reduce swelling. Pay attention to any changes in your symptoms. Moisturize your skin daily to help prevent skin from cracking and draining. Keep all follow-up visits. This is important. Contact a health care provider if: You have a fever. You have swelling in only one leg. You have increased swelling, redness, or pain in one or both of your legs. You have drainage or sores at the area where you have edema. Get help right away if: You have edema that starts suddenly or is getting worse, especially if you are pregnant or have a medical condition. You develop shortness of breath, especially when you are lying down. You have pain in your chest or abdomen. You feel weak. You feel like you will faint. These symptoms may be an emergency. Get help right away. Call 911. Do not wait to see if the symptoms will go away. Do not drive yourself to the hospital. Summary Peripheral edema is swelling that is caused by a buildup of fluid. Peripheral edema most often affects the lower legs, ankles, and feet. Move around often to prevent stiffness and to reduce swelling. Do not sit or stand for long periods of time. Pay attention to any changes in your symptoms. Contact a health care provider if you have edema that starts suddenly or is getting worse, especially if you are pregnant or have a medical condition. Get help right away if you develop shortness of breath, especially when lying down.   This information is not intended to replace advice given to you by your health care provider. Make sure you discuss any questions you have with your health care provider. Document Revised: 07/20/2021 Document Reviewed: 07/20/2021 Elsevier Patient Education  2024 [employer] Inc.  

## 2023-10-11 LAB — CBC WITH DIFFERENTIAL/PLATELET
Basophils Absolute: 0.1 10*3/uL (ref 0.0–0.2)
Basos: 1 %
EOS (ABSOLUTE): 0.3 10*3/uL (ref 0.0–0.4)
Eos: 6 %
Hematocrit: 36.9 % (ref 34.0–46.6)
Hemoglobin: 12 g/dL (ref 11.1–15.9)
Immature Grans (Abs): 0 10*3/uL (ref 0.0–0.1)
Immature Granulocytes: 0 %
Lymphocytes Absolute: 1.4 10*3/uL (ref 0.7–3.1)
Lymphs: 29 %
MCH: 31.6 pg (ref 26.6–33.0)
MCHC: 32.5 g/dL (ref 31.5–35.7)
MCV: 97 fL (ref 79–97)
Monocytes Absolute: 0.5 10*3/uL (ref 0.1–0.9)
Monocytes: 11 %
Neutrophils Absolute: 2.6 10*3/uL (ref 1.4–7.0)
Neutrophils: 53 %
Platelets: 179 10*3/uL (ref 150–450)
RBC: 3.8 x10E6/uL (ref 3.77–5.28)
RDW: 12.9 % (ref 11.7–15.4)
WBC: 4.9 10*3/uL (ref 3.4–10.8)

## 2023-10-11 LAB — CMP14+EGFR
ALT: 16 IU/L (ref 0–32)
AST: 28 [IU]/L (ref 0–40)
Albumin: 3.9 g/dL (ref 3.7–4.7)
Alkaline Phosphatase: 64 [IU]/L (ref 44–121)
BUN/Creatinine Ratio: 10 — ABNORMAL LOW (ref 12–28)
BUN: 10 mg/dL (ref 8–27)
Bilirubin Total: 0.6 mg/dL (ref 0.0–1.2)
CO2: 22 mmol/L (ref 20–29)
Calcium: 9.2 mg/dL (ref 8.7–10.3)
Chloride: 103 mmol/L (ref 96–106)
Creatinine, Ser: 1 mg/dL (ref 0.57–1.00)
Globulin, Total: 2.7 g/dL (ref 1.5–4.5)
Glucose: 124 mg/dL — ABNORMAL HIGH (ref 70–99)
Potassium: 4.2 mmol/L (ref 3.5–5.2)
Sodium: 140 mmol/L (ref 134–144)
Total Protein: 6.6 g/dL (ref 6.0–8.5)
eGFR: 54 mL/min/{1.73_m2} — ABNORMAL LOW (ref >59)

## 2023-10-11 LAB — LIPID PANEL
Chol/HDL Ratio: 2.9 ratio (ref 0.0–4.4)
Cholesterol, Total: 171 mg/dL (ref 100–199)
HDL: 60 mg/dL (ref >39)
LDL Chol Calc (NIH): 90 mg/dL (ref 0–99)
Triglycerides: 116 mg/dL (ref 0–149)
VLDL Cholesterol Cal: 21 mg/dL (ref 5–40)

## 2023-10-11 NOTE — Progress Notes (Addendum)
Cardiology Clinic Note   Patient Name: Debra Campbell Date of Encounter: 10/14/2023  Primary Care Provider:  Bennie Pierini, FNP Primary Cardiologist:  Bryan Lemma, MD  Patient Profile    Debra Campbell 89 year old female presents the clinic today for follow-up evaluation of her hypertension and atrial fibrillation.  Past Medical History    Past Medical History:  Diagnosis Date   Aortic calcification (HCC) 2013   Atherosclerotic calcifications of the abdominal aorta and branch noted on CT Abd/Pelvis   Arthritis    legs, lower back, hands   Cancer (HCC)    endometrial   Diabetes mellitus    Ectopic pregnancy    Eczema    Headache(784.0)    hx of   History of kidney stones    Hyperlipidemia    no meds   Hypertension    Hypothyroidism    Persistent atrial fibrillation (HCC) 09/28/2016   Relatively new Dx: Asymptomatic.  Rate controlled without medication.   SVD (spontaneous vaginal delivery)    x 3   Thyroid disease    Past Surgical History:  Procedure Laterality Date   ABDOMINAL HYSTERECTOMY Bilateral 06/26/2013   Procedure: TOTAL ABDOMINAL HYSTERECTOMY WITH BILATERAL SALPINGO OOPHERECTOMY WITH PELVIC LYMPHADNECTOMY;  Surgeon: Jeannette Corpus, MD;  Location: [facility];  Service: Gynecology;  Laterality: Bilateral;   BACK SURGERY  06/26/2013   ECTOPIC PREGNANCY SURGERY     HERNIA REPAIR     HYSTEROSCOPY WITH D & C N/A 04/26/2013   Procedure: DILATATION AND CURETTAGE /HYSTEROSCOPY;  Surgeon: Adam Phenix, MD;  Location: [facility];  Service: Gynecology;  Laterality: N/A;   JOINT REPLACEMENT     LAPAROTOMY     for ectopic pregnancy   LAPAROTOMY N/A 06/26/2013   Procedure: EXPLORATORY LAPAROTOMY;  Surgeon: Jeannette Corpus, MD;  Location: [facility];  Service: Gynecology;  Laterality: N/A;   lt knee arthroscopy     rt total hip replacement     TONSILLECTOMY     TOTAL KNEE ARTHROPLASTY Left 12/22/2016   Procedure: LEFT TOTAL KNEE ARTHROPLASTY;   Surgeon: Ranee Gosselin, MD;  Location: [facility];  Service: Orthopedics;  Laterality: Left;   TRANSTHORACIC ECHOCARDIOGRAM  09/2018   EF 60-65%. Mild AS.  Mod-Severe PAH 60 mmHg   WISDOM TOOTH EXTRACTION      Allergies  Allergies  Allergen Reactions   Ace Inhibitors Cough   Iohexol      Desc: HIVES 40 YEARS AGO    Januvia [Sitagliptin] Other (See Comments)    Constipation and body aches   Statins     myalgia   Penicillins Swelling and Rash    Has patient had a PCN reaction causing immediate rash, facial/tongue/throat swelling, SOB or lightheadedness with hypotension: Yes Has patient had a PCN reaction causing severe rash involving mucus membranes or skin necrosis: No Has patient had a PCN reaction that required hospitalization Yes Has patient had a PCN reaction occurring within the last 10 years: No If all of the above answers are "NO", then may proceed with Cephalosporin use.    Sulfa Antibiotics Rash    History of Present Illness    Debra Campbell has a PMH of HTN, atrial fibrillation CHA2DS2-VASc score 5, aortic calcification, mild aortic stenosis, HLD, hypothyroidism, diabetic neuropathy, osteoarthritis, obesity, peripheral edema, and is status post left total knee replacement.  She was seen in the emergency department 11/28/2021 with nephrolithiasis/ureterolithiasis.  She reported flank pain with hematuria.  She was noted to have a left  ureteral stone with hydronephrosis.  She was treated with analgesia and narcotics.  She was discharged on Flomax and referred to urology.  She was seen in follow-up by Dr. Herbie Baltimore on 04/12/2022.  During that time she continued to be stable from a cardiac standpoint.  She noted some heart fluttering with getting upset and exertion.  She was not very active and was using a cane in the house and a walker for longer distances.  She was limited in her mobility due to back and hip pain as well as knee pain.    She presented to the clinic 01/06/23 for  follow-up evaluation and stated she continued to use her walker with ambulation.  She was no longer using her cane and was using her walker in her house due to her back and left lower extremity pain.  This  limited her physical activity.  Her blood pressure was well-controlled at 128/78.  Her EKG showed atrial fibrillation 74 bpm.  Her biggest complaint was with her refills of her medications.  She had trouble with [facility].  She requested 90-day supply and 3 refills.  I asked her to increase the fiber in her diet, we continued her  medication regimen and planned follow-up in 9 months.  She presents to the clinic today for follow-up evaluation and states she feels like she is doing fairly well.  She recently had a pair of Unna boots on her bilateral lower extremities.  She believes that she had an allergic reaction to the boots.  She has multiple areas of 0.5 cm x 0.5 cm round erythema.  She was prescribed doxycycline for these.  I have instructed her to keep the area clean and dry.  We reviewed the importance of lower extremity support stockings and daily weights.  She expressed understanding.  Her blood pressure is not well-controlled today.  Her heart rate is rate controlled.  She reports compliance with her Xarelto and denies bleeding issues.  I will give her the [place] support stocking sheet, have her maintain her physical activity and plan follow-up in 6 months.  Today she denies chest pain, shortness of breath, lower extremity edema, fatigue, palpitations, melena, hematuria, hemoptysis, diaphoresis, weakness, presyncope, syncope, orthopnea, and PND.       Home Medications    Prior to Admission medications   Medication Sig Start Date End Date Taking? Authorizing Provider  Accu-Chek Softclix Lancets lancets CHECK BLOOD SUGAR ONCE DAILY DX E11.42 09/11/21   Daphine Deutscher, [name], FNP  acetaminophen (TYLENOL) 500 MG tablet Take 1 tablet (500 mg total) by mouth every 8 (eight) hours as needed.  10/13/18   Albertine Grates, MD  furosemide (LASIX) 40 MG tablet Take 2 tablets (80 mg total) by mouth 2 (two) times daily. MAY TAKE AN ADDITIONAL 40 MG IF WEIGHT GAIN OF 3 LBS OR MORE OVER NIGHT 07/19/22 01/15/23  Daphine Deutscher, [name], FNP  gabapentin (NEURONTIN) 300 MG capsule Take 1 capsule (300 mg total) by mouth 2 (two) times daily. 07/19/22   Daphine Deutscher [name], FNP  glucose blood (ACCU-CHEK GUIDE) test strip CHECK BLOOD SUGAR ONCE DAILY DX E11.42 06/08/21   Daphine Deutscher [name], FNP  levothyroxine (SYNTHROID) 125 MCG tablet Take 1 tablet (125 mcg total) by mouth daily. 07/19/22   Daphine Deutscher [name], FNP  metFORMIN (GLUCOPHAGE) 500 MG tablet Take 1 tablet (500 mg total) by mouth 2 (two) times daily with a meal. 10/25/22   Daphine Deutscher, [name], FNP  metoprolol succinate (TOPROL-XL) 25 MG 24 hr tablet Take 1 tablet (25 mg total) by  mouth daily. 07/19/22   Daphine Deutscher, [name], FNP  nystatin cream (MYCOSTATIN) Apply 1 Application topically 2 (two) times daily. 10/19/22   Daphine Deutscher [name], FNP  rivaroxaban (XARELTO) 20 MG TABS tablet TAKE 1 TABLET BY MOUTH EVERY DAY WITH BREAKFAST 12/10/22   Marykay Lex, MD    Family History    Family History  Problem Relation Age of Onset   Heart disease Father    Thyroid disease Father    Heart attack Father    Heart disease Sister        open heart surgery   Cancer Sister        breast   Neuropathy Sister        secondary to cancer treatment   Breast cancer Sister    Suicidality Brother 45   Arthritis Mother    Cancer Daughter    Breast cancer Daughter    She indicated that her mother is deceased. She indicated that her father is deceased. She indicated that her sister is alive. She indicated that her brother is deceased. She indicated that only one of her two daughters is alive. She indicated that her son is alive.  Social History    Social History   Socioeconomic History   Marital status: Married    Spouse name: Not on file   Number  of children: 3   Years of education: Not on file   Highest education level: High school graduate  Occupational History   Occupation: Retired    Comment: [employer]  Tobacco Use   Smoking status: Never   Smokeless tobacco: Never  Vaping Use   Vaping status: Never Used  Substance and Sexual Activity   Alcohol use: No   Drug use: No   Sexual activity: Not Currently    Birth control/protection: Post-menopausal  Other Topics Concern   Not on file  Social History Narrative   Not on file   Social Determinants of Health   Financial Resource Strain: Low Risk  (11/30/2022)   Overall Financial Resource Strain (CARDIA)    Difficulty of Paying Living Expenses: Not hard at all  Food Insecurity: No Food Insecurity (11/30/2022)   Hunger Vital Sign    Worried About Running Out of Food in the Last Year: Never true    Ran Out of Food in the Last Year: Never true  Transportation Needs: No Transportation Needs (11/30/2022)   PRAPARE - Administrator, Civil Service (Medical): No    Lack of Transportation (Non-Medical): No  Physical Activity: Insufficiently Active (11/30/2022)   Exercise Vital Sign    Days of Exercise per Week: 3 days    Minutes of Exercise per Session: 30 min  Stress: No Stress Concern Present (11/30/2022)   [employer] of Occupational Health - Occupational Stress Questionnaire    Feeling of Stress : Not at all  Social Connections: Moderately Isolated (11/30/2022)   Social Connection and Isolation Panel [NHANES]    Frequency of Communication with Friends and Family: More than three times a week    Frequency of Social Gatherings with Friends and Family: More than three times a week    Attends Religious Services: Never    Database administrator or Organizations: No    Attends Banker Meetings: Never    Marital Status: Married  Catering manager Violence: Not At Risk (11/30/2022)   Humiliation, Afraid, Rape, and Kick questionnaire    Fear of Current or  Ex-Partner: No    Emotionally Abused: No  Physically Abused: No    Sexually Abused: No     Review of Systems    General:  No chills, fever, night sweats or weight changes.  Cardiovascular:  No chest pain, dyspnea on exertion, edema, orthopnea, palpitations, paroxysmal nocturnal dyspnea. Dermatological: No rash, lesions/masses Respiratory: No cough, dyspnea Urologic: No hematuria, dysuria Abdominal:   No nausea, vomiting, diarrhea, bright red blood per rectum, melena, or hematemesis Neurologic:  No visual changes, wkns, changes in mental status. All other systems reviewed and are otherwise negative except as noted above.  Physical Exam    VS:  BP 128/68 (BP Location: Right Arm, Patient Position: Sitting, Cuff Size: Large)   Pulse 75   Ht 5\' 4"  (1.626 m)   Wt 193 lb (87.5 kg)   BMI 33.13 kg/m  , BMI Body mass index is 33.13 kg/m. GEN: Well nourished, well developed, in no acute distress. HEENT: normal. Neck: Supple, no JVD, carotid bruits, or masses. Cardiac: Irregularly irregular, no murmurs, rubs, or gallops. No clubbing, cyanosis, right greater than left lower extremity nonpitting edema.  Radials/DP/PT 2+ and equal bilaterally.  Respiratory:  Respirations regular and unlabored, clear to auscultation bilaterally. GI: Soft, nontender, nondistended, BS + x 4. MS: no deformity or atrophy. Skin: warm and dry, no rash.  Bilateral lower extremity 0.5 cm x 0.5 cm rash Neuro:  Strength and sensation are intact. Psych: Normal affect.  Accessory Clinical Findings    Recent Labs: 10/10/2023: ALT 16; BUN 10; Creatinine, Ser 1.00; Hemoglobin 12.0; Platelets 179; Potassium 4.2; Sodium 140   Recent Lipid Panel    Component Value Date/Time   CHOL 171 10/10/2023 1131   CHOL 230 (H) 03/19/2013 1034   TRIG 116 10/10/2023 1131   TRIG 253 (H) 05/01/2015 0913   TRIG 149 03/19/2013 1034   HDL 60 10/10/2023 1131   HDL 42 05/01/2015 0913   HDL 52 03/19/2013 1034   CHOLHDL 2.9 10/10/2023  1131   LDLCALC 90 10/10/2023 1131   LDLCALC 122 (H) 07/11/2014 0922   LDLCALC 148 (H) 03/19/2013 1034         ECG personally reviewed by me today- EKG Interpretation Date/Time:  Friday October 14 2023 13:22:41 EST Ventricular Rate:  75 PR Interval:    QRS Duration:  78 QT Interval:  312 QTC Calculation: 348 R Axis:   5  Text Interpretation: Atrial fibrillation Low voltage QRS Septal infarct , age undetermined When compared with ECG of 09-Oct-2018 15:11, PREVIOUS ECG IS PRESENT Confirmed by Edd Fabian [phone]) on 10/14/2023 1:48:43 PM   EKG 01/06/2023 atrial fibrillation 74 bpm- No acute changes  Echocardiogram 10/12/2018  Study Conclusions   - Left ventricle: The cavity size was normal. Wall thickness was    normal. Systolic function was normal. The estimated ejection    fraction was in the range of 60% to 65%. Wall motion was normal;    there were no regional wall motion abnormalities.  - Aortic valve: Mildly to moderately calcified annulus. Moderately    thickened, moderately calcified leaflets. There was mild    stenosis. Valve area (VTI): 2.34 cm^2. Valve area (Vmax): 2.52    cm^2. Valve area (Vmean): 2.71 cm^2.  - Mitral valve: There was mild regurgitation.  - Left atrium: The atrium was mildly dilated.  - Right ventricle: Systolic function was mildly reduced.  - Right atrium: The atrium was mildly dilated.  - Tricuspid valve: There was moderate regurgitation.  - Pulmonary arteries: Systolic pressure was moderately to severely    increased.  PA peak pressure: 60 mm Hg (S).   -------------------------------------------------------------------  Study data:  Comparison was made to the study of 10/22/2016.  Study  status:  Routine.  Procedure:  The patient reported no pain pre or  post test. Transthoracic echocardiography. Image quality was  adequate.  Study completion:  There were no complications.  Transthoracic echocardiography.  M-mode, complete 2D, spectral   Doppler, and color Doppler.  Birthdate:  Patient birthdate:  [birth year].  Age:  Patient is 89 yr old.  Sex:  Gender: female.  BMI: 39.4 kg/m^2.  Blood pressure:     115/61  Patient status:  Inpatient.  Study date:  Study date: 10/12/2018. Study time: 02:45  PM.  Location:  Echo laboratory.   -------------------------------------------------------------------   -------------------------------------------------------------------  Left ventricle:  The cavity size was normal. Wall thickness was  normal. Systolic function was normal. The estimated ejection  fraction was in the range of 60% to 65%. Wall motion was normal;  there were no regional wall motion abnormalities. The study was not  technically sufficient to allow evaluation of LV diastolic  dysfunction due to atrial fibrillation.   -------------------------------------------------------------------  Aortic valve:   Mildly to moderately calcified annulus. Moderately  thickened, moderately calcified leaflets.  Doppler:   There was  mild stenosis.      VTI ratio of LVOT to aortic valve: 0.68. Valve  area (VTI): 2.34 cm^2. Indexed valve area (VTI): 1.05 cm^2/m^2.  Peak velocity ratio of LVOT to aortic valve: 0.73. Valve area  (Vmax): 2.52 cm^2. Indexed valve area (Vmax): 1.13 cm^2/m^2. Mean  velocity ratio of LVOT to aortic valve: 0.78. Valve area (Vmean):  2.71 cm^2. Indexed valve area (Vmean): 1.22 cm^2/m^2.    Mean  gradient (S): 9 mm Hg. Peak gradient (S): 16 mm Hg.   -------------------------------------------------------------------  Aorta: Aortic root: The aortic root was normal in size.  Ascending aorta: The ascending aorta was normal in size.   -------------------------------------------------------------------  Mitral valve:   Structurally normal valve.   Leaflet separation was  normal.  Doppler:  Transvalvular velocity was within the normal  range. There was no evidence for stenosis. There was mild  regurgitation.     Valve area by pressure half-time: 3.44 cm^2.  Indexed valve area by pressure half-time: 1.55 cm^2/m^2.    Peak  gradient (D): 9 mm Hg.   -------------------------------------------------------------------  Left atrium:  The atrium was mildly dilated.   -------------------------------------------------------------------  Right ventricle:  The cavity size was normal. Systolic function was  mildly reduced.   -------------------------------------------------------------------  Pulmonic valve:    The valve appears to be grossly normal.  Doppler:  There was mild to moderate regurgitation.   -------------------------------------------------------------------  Tricuspid valve:   The valve appears to be grossly normal.  Doppler:  There was moderate regurgitation.   -------------------------------------------------------------------  Pulmonary artery:   Systolic pressure was moderately to severely  increased.   -------------------------------------------------------------------  Right atrium:  The atrium was mildly dilated.   -------------------------------------------------------------------  Pericardium: There was no pericardial effusion.    Assessment & Plan   1.  Atrial fibrillation-Heart rate today 75 bpm.  Denies accelerated or irregular heartbeat.  Continues to be cardiac unaware.  Compliant with Xarelto and denies bleeding issues. Continue Xarelto, metoprolol Avoid triggers caffeine, chocolate, EtOH, dehydration etc.  Essential hypertension-BP today 128/68 Continue metoprolol Heart healthy low-sodium diet Increase physical activity as tolerated Maintain blood pressure log  Hyperlipidemia-LDL 117 on 10/19/22.  Previously noted to wish to defer cholesterol reducing medication Heart healthy low-sodium high-fiber diet  Increase physical activity as tolerated Request labs from PCP  Aortic calcification, mild aortic stenosis-performing baseline physical activity. Repeat  echocardiogram when clinically indicated  Pulmonary hypertension-able to perform activities of daily living independently.  Continues to be sedentary.  Previously felt to have some diastolic dysfunction in the setting of atrial fibrillation and OHS. Increase physical activity as tolerated Continue to monitor  Rash-bilateral lower extremity.  Appeared after having bilateral Unna boots.  She was prescribed doxycycline for lower extremity cellulitis.  She has not yet started taking the medication.  I outlined to her areas of erythema.  She presents with her son.  I have instructed her to continue to keep the areas clean and dry with daily washing and lower extremity support stockings as well.  I instructed her to start doxycycline if she develops any signs or symptoms of cellulitis. Follow-up with PCP  Disposition: Follow-up with Dr. Herbie Baltimore or me in 6 months.   Thomasene Ripple. Shataya Winkles NP-C     10/14/2023, 1:46 PM [facility] [address] Office [phone] Fax [phone]    I spent 14 minutes examining this patient, reviewing medications, and using patient centered shared decision making involving her cardiac care.  Prior to her visit I spent greater than 20 minutes reviewing her past medical history,  medications, and prior cardiac tests.

## 2023-10-13 ENCOUNTER — Ambulatory Visit: Payer: Medicare Other | Admitting: Nurse Practitioner

## 2023-10-13 ENCOUNTER — Encounter: Payer: Self-pay | Admitting: Nurse Practitioner

## 2023-10-13 VITALS — BP 125/76 | HR 68 | Temp 97.1°F

## 2023-10-13 DIAGNOSIS — R600 Localized edema: Secondary | ICD-10-CM | POA: Diagnosis not present

## 2023-10-13 NOTE — Progress Notes (Signed)
   Subjective:    Patient ID: Debra Campbell, female    DOB: 1932/06/23, 89 y.o.   MRN: [id number]   Chief Complaint: Recheck legs   HPI  Patient was seen Monday with cellulitis of bil lower ext. She was placed in unna boots and was given zaroxlyn for 2 days. Have improved.  Patient Active Problem List   Diagnosis Date Noted   Mild aortic stenosis by prior echocardiogram 01/30/2020   Pulmonary hypertension, unspecified (HCC) 10/27/2018   Peripheral edema 07/25/2018   Status post total left knee replacement 12/22/2016   S/P TKR (total knee replacement) 12/22/2016   Permanent atrial fibrillation (HCC) CHA2DS2Vasc = 5; Xarelto 09/28/2016   Morbid obesity (HCC) 08/18/2015   Diabetes mellitus treated with oral medication (HCC) 05/01/2015   Endometrial cancer (HCC) 05/08/2013   Aortic calcification (HCC) 11/30/2011   Hypertension associated with type 2 diabetes mellitus (HCC) 03/18/2011   Hypothyroidism 03/18/2011   Osteoarthritis 03/18/2011   Osteopenia 03/18/2011   Hyperlipidemia associated with type 2 diabetes mellitus (HCC) 03/18/2011   Diabetic neuropathy (HCC) 03/18/2011       Review of Systems  Constitutional:  Negative for diaphoresis.  Eyes:  Negative for pain.  Respiratory:  Negative for shortness of breath.   Cardiovascular:  Negative for chest pain, palpitations and leg swelling.  Gastrointestinal:  Negative for abdominal pain.  Endocrine: Negative for polydipsia.  Skin:  Negative for rash.  Neurological:  Negative for dizziness, weakness and headaches.  Hematological:  Does not bruise/bleed easily.  All other systems reviewed and are negative.      Objective:   Physical Exam Constitutional:      Appearance: Normal appearance. She is obese.  Cardiovascular:     Rate and Rhythm: Normal rate and regular rhythm.     Heart sounds: Normal heart sounds.  Pulmonary:     Effort: Pulmonary effort is normal.  Musculoskeletal:     Right lower leg: Edema (2+)  present.     Left lower leg: Edema (1+) present.  Neurological:     General: No focal deficit present.     Mental Status: She is alert and oriented to person, place, and time.  Psychiatric:        Mood and Affect: Mood normal.        Behavior: Behavior normal.     BP 125/76   Pulse 68   Temp (!) 97.1 F (36.2 C) (Temporal)        Assessment & Plan:   NIMRAT KNOBLAUCH in today with chief complaint of Recheck legs   1. Peripheral edema Elevate legs when sitting Compression socks Continue fluid pill Do not take zaroxlyn unless told to RTO prn    The above assessment and management plan was discussed with the patient. The patient verbalized understanding of and has agreed to the management plan. Patient is aware to call the clinic if symptoms persist or worsen. Patient is aware when to return to the clinic for a follow-up visit. Patient educated on when it is appropriate to go to the emergency department.   Debra Daphine Deutscher, FNP

## 2023-10-13 NOTE — Patient Instructions (Signed)
Peripheral Edema  Peripheral edema is swelling that is caused by a buildup of fluid. Peripheral edema most often affects the lower legs, ankles, and feet. It can also develop in the arms, hands, and face. The area of the body that has peripheral edema will look swollen. It may also feel heavy or warm. Your clothes may start to feel tight. Pressing on the area may make a temporary dent in your skin (pitting edema). You may not be able to move your swollen arm or leg as much as usual. There are many causes of peripheral edema. It can happen because of a complication of other conditions such as heart failure, kidney disease, or a problem with your circulation. It also can be a side effect of certain medicines or happen because of an infection. It often happens to women during pregnancy. Sometimes, the cause is not known. Follow these instructions at home: Managing pain, stiffness, and swelling  Raise (elevate) your legs while you are sitting or lying down. Move around often to prevent stiffness and to reduce swelling. Do not sit or stand for long periods of time. Do not wear tight clothing. Do not wear garters on your upper legs. Exercise your legs to get your circulation going. This helps to move the fluid back into your blood vessels, and it may help the swelling go down. Wear compression stockings as told by your health care provider. These stockings help to prevent blood clots and reduce swelling in your legs. It is important that these are the correct size. These stockings should be prescribed by your doctor to prevent possible injuries. If elastic bandages or wraps are recommended, use them as told by your health care provider. Medicines Take over-the-counter and prescription medicines only as told by your health care provider. Your health care provider may prescribe medicine to help your body get rid of excess water (diuretic). Take this medicine if you are told to take it. General  instructions Eat a low-salt (low-sodium) diet as told by your health care provider. Sometimes, eating less salt may reduce swelling. Pay attention to any changes in your symptoms. Moisturize your skin daily to help prevent skin from cracking and draining. Keep all follow-up visits. This is important. Contact a health care provider if: You have a fever. You have swelling in only one leg. You have increased swelling, redness, or pain in one or both of your legs. You have drainage or sores at the area where you have edema. Get help right away if: You have edema that starts suddenly or is getting worse, especially if you are pregnant or have a medical condition. You develop shortness of breath, especially when you are lying down. You have pain in your chest or abdomen. You feel weak. You feel like you will faint. These symptoms may be an emergency. Get help right away. Call 911. Do not wait to see if the symptoms will go away. Do not drive yourself to the hospital. Summary Peripheral edema is swelling that is caused by a buildup of fluid. Peripheral edema most often affects the lower legs, ankles, and feet. Move around often to prevent stiffness and to reduce swelling. Do not sit or stand for long periods of time. Pay attention to any changes in your symptoms. Contact a health care provider if you have edema that starts suddenly or is getting worse, especially if you are pregnant or have a medical condition. Get help right away if you develop shortness of breath, especially when lying down.   This information is not intended to replace advice given to you by your health care provider. Make sure you discuss any questions you have with your health care provider. Document Revised: 07/20/2021 Document Reviewed: 07/20/2021 Elsevier Patient Education  2024 [employer] Inc.  

## 2023-10-14 ENCOUNTER — Encounter: Payer: Self-pay | Admitting: General Practice

## 2023-10-14 ENCOUNTER — Ambulatory Visit: Payer: Medicare Other | Attending: General Practice | Admitting: General Practice

## 2023-10-14 VITALS — BP 128/68 | HR 75 | Ht 64.0 in | Wt 193.0 lb

## 2023-10-14 DIAGNOSIS — I272 Pulmonary hypertension, unspecified: Secondary | ICD-10-CM

## 2023-10-14 DIAGNOSIS — E785 Hyperlipidemia, unspecified: Secondary | ICD-10-CM

## 2023-10-14 DIAGNOSIS — I152 Hypertension secondary to endocrine disorders: Secondary | ICD-10-CM

## 2023-10-14 DIAGNOSIS — E1169 Type 2 diabetes mellitus with other specified complication: Secondary | ICD-10-CM | POA: Diagnosis not present

## 2023-10-14 DIAGNOSIS — I4821 Permanent atrial fibrillation: Secondary | ICD-10-CM | POA: Diagnosis not present

## 2023-10-14 DIAGNOSIS — R21 Rash and other nonspecific skin eruption: Secondary | ICD-10-CM | POA: Diagnosis not present

## 2023-10-14 DIAGNOSIS — I700 Atherosclerosis of aorta: Secondary | ICD-10-CM | POA: Diagnosis not present

## 2023-10-14 DIAGNOSIS — E1159 Type 2 diabetes mellitus with other circulatory complications: Secondary | ICD-10-CM

## 2023-10-14 NOTE — Patient Instructions (Signed)
   Follow-Up: At [facility], you and your health needs are our priority.  As part of our continuing mission to provide you with exceptional heart care, we have created designated Provider Care Teams.  These Care Teams include your primary Cardiologist (physician) and Advanced Practice Providers (APPs -  Physician Assistants and Nurse Practitioners) who all work together to provide you with the care you need, when you need it.  We recommend signing up for the patient portal called "MyChart".  Sign up information is provided on this After Visit Summary.  MyChart is used to connect with patients for Virtual Visits (Telemedicine).  Patients are able to view lab/test results, encounter notes, upcoming appointments, etc.  Non-urgent messages can be sent to your provider as well.   To learn more about what you can do with MyChart, go to [email or web address].    Your next appointment:   6 month(s)  Provider:   Edd Fabian, FNP      Other Instructions  Wash both lower leg allergic reaction areas with soap and water daily. Apply OTC benadryl spray to all sites afterwards. Allergic spots have been marked on lower legs.  If these areas increase in size to the outside of marked area, become reddened, hot, or you develop fever with chills please start the antibiotic prescription that you have already been given by your PCP.  Compression Hosiery hand out given.

## 2023-12-02 ENCOUNTER — Ambulatory Visit: Payer: Medicare Other

## 2023-12-02 VITALS — Ht 64.0 in | Wt 193.0 lb

## 2023-12-02 DIAGNOSIS — Z0000 Encounter for general adult medical examination without abnormal findings: Secondary | ICD-10-CM | POA: Diagnosis not present

## 2023-12-02 DIAGNOSIS — Z780 Asymptomatic menopausal state: Secondary | ICD-10-CM | POA: Diagnosis not present

## 2023-12-02 NOTE — Patient Instructions (Signed)
 Ms. Radford , Thank you for taking time to come for your Medicare Wellness Visit. I appreciate your ongoing commitment to your health goals. Please review the following plan we discussed and let me know if I can assist you in the future.   Referrals/Orders/Follow-Ups/Clinician Recommendations: Aim for 30 minutes of exercise or brisk walking, 6-8 glasses of water , and 5 servings of fruits and vegetables each day.  This is a list of the screening recommended for you and due dates:  Health Maintenance  Topic Date Due   Eye exam for diabetics  07/23/2021   COVID-19 Vaccine (4 - 2024-25 season) 07/31/2023   DTaP/Tdap/Td vaccine (4 - Td or Tdap) 10/09/2024*   Complete foot exam   01/18/2024   Hemoglobin A1C  04/08/2024   Medicare Annual Wellness Visit  12/01/2024   Pneumonia Vaccine  Completed   Flu Shot  Completed   DEXA scan (bone density measurement)  Completed   Zoster (Shingles) Vaccine  Completed   HPV Vaccine  Aged Out  *Topic was postponed. The date shown is not the original due date.    Advanced directives: (ACP Link)Information on Advanced Care Planning can be found at [place]  Secretary of [facility] Advance Health Care Directives Advance Health Care Directives ([email or web address]   Next Medicare Annual Wellness Visit scheduled for next year: Yes

## 2023-12-02 NOTE — Progress Notes (Signed)
 Subjective:   Debra Campbell is a 89 y.o. female who presents for Medicare Annual (Subsequent) preventive examination.  Visit Complete: Virtual I connected with  Debra Campbell on 12/02/23 by a audio enabled telemedicine application and verified that I am speaking with the correct person using two identifiers.  Patient Location: Home  Provider Location: Home Office  Interactive audio and video telecommunications were attempted between this provider and patient, however failed, due to patient having technical difficulties OR patient did not have access to video capability.  We continued and completed visit with audio only.  I discussed the limitations of evaluation and management by telemedicine. The patient expressed understanding and agreed to proceed.  Vital Signs: Because this visit was a virtual/telehealth visit, some criteria may be missing or patient reported. Any vitals not documented were not able to be obtained and vitals that have been documented are patient reported.  Cardiac Risk Factors include: advanced age (>92men, >39 women);diabetes mellitus;dyslipidemia;hypertension     Objective:    Today's Vitals   12/02/23 0854  Weight: 193 lb (87.5 kg)  Height: 5' 4 (1.626 m)   Body mass index is 33.13 kg/m.     12/02/2023    9:01 AM 11/30/2022    9:52 AM 11/28/2021   10:43 PM 11/27/2021   10:08 AM 11/18/2020   10:53 AM 11/14/2019   10:45 AM 10/30/2018   10:42 AM  Advanced Directives  Does Patient Have a Medical Advance Directive? No Yes Yes Yes No No Yes  Type of Special Educational Needs Teacher of [place];Living will Healthcare Power of [place];Living will Healthcare Power of [place];Living will   Healthcare Power of [place];Living will  Does patient want to make changes to medical advance directive?   No - Patient declined No - Patient declined   No - Patient declined  Copy of Healthcare Power of Attorney in Chart?  No - copy requested     No - copy requested   Would patient like information on creating a medical advance directive? Yes (MAU/Ambulatory/Procedural Areas - Information given)    No - Patient declined No - Patient declined     Current Medications (verified) Outpatient Encounter Medications as of 12/02/2023  Medication Sig   Accu-Chek Softclix Lancets lancets Check BS daily Dx E11.42   acetaminophen  (TYLENOL ) 500 MG tablet Take 1 tablet (500 mg total) by mouth every 8 (eight) hours as needed.   Blood Glucose Monitoring Suppl (ACCU-CHEK GUIDE ME) w/Device KIT TEST BS TID DX E11.42   furosemide  (LASIX ) 40 MG tablet Take 2 tablets (80 mg total) by mouth 2 (two) times daily. TAKE 2 TABLETS TWICE A DAY.MAY TAKE AN ADDITIONAL     1 TABLET IF WEIGHT GAIN OF 3LBS OR MORE OVERNIGHT.   gabapentin  (NEURONTIN ) 300 MG capsule Take 1 capsule (300 mg total) by mouth 2 (two) times daily.   glucose blood (ACCU-CHEK GUIDE) test strip TEST BS TID DX E11.42   levothyroxine  (SYNTHROID ) 125 MCG tablet Take 1 tablet (125 mcg total) by mouth daily.   metFORMIN  (GLUCOPHAGE ) 500 MG tablet Take 1 tablet (500 mg total) by mouth 2 (two) times daily with a meal.   metolazone  (ZAROXOLYN ) 2.5 MG tablet 1 po for 2 days then prn   metoprolol  succinate (TOPROL -XL) 25 MG 24 hr tablet Take 1 tablet (25 mg total) by mouth daily.   nystatin  cream (MYCOSTATIN ) Apply 1 Application topically 2 (two) times daily.   rivaroxaban  (XARELTO ) 20 MG TABS tablet TAKE 1 TABLET BY  MOUTH EVERY DAY WITH BREAKFAST   [DISCONTINUED] doxycycline  (VIBRA -TABS) 100 MG tablet Take 1 tablet (100 mg total) by mouth 2 (two) times daily. 1 po bid   No facility-administered encounter medications on file as of 12/02/2023.    Allergies (verified) Ace inhibitors, Iohexol, Januvia  [sitagliptin ], Statins, Penicillins, and Sulfa antibiotics   History: Past Medical History:  Diagnosis Date   Aortic calcification (HCC) 2013   Atherosclerotic calcifications of the abdominal aorta and branch noted on CT  Abd/Pelvis   Arthritis    legs, lower back, hands   Cancer (HCC)    endometrial   Diabetes mellitus    Ectopic pregnancy    Eczema    Headache(784.0)    hx of   History of kidney stones    Hyperlipidemia    no meds   Hypertension    Hypothyroidism    Persistent atrial fibrillation (HCC) 09/28/2016   Relatively new Dx: Asymptomatic.  Rate controlled without medication.   SVD (spontaneous vaginal delivery)    x 3   Thyroid  disease    Past Surgical History:  Procedure Laterality Date   ABDOMINAL HYSTERECTOMY Bilateral 06/26/2013   Procedure: TOTAL ABDOMINAL HYSTERECTOMY WITH BILATERAL SALPINGO OOPHERECTOMY WITH PELVIC LYMPHADNECTOMY;  Surgeon: Toribio LITTIE Percy, MD;  Location: [facility];  Service: Gynecology;  Laterality: Bilateral;   BACK SURGERY  06/26/2013   ECTOPIC PREGNANCY SURGERY     HERNIA REPAIR     HYSTEROSCOPY WITH D & C N/A 04/26/2013   Procedure: DILATATION AND CURETTAGE /HYSTEROSCOPY;  Surgeon: Lynwood KANDICE Solomons, MD;  Location: [facility];  Service: Gynecology;  Laterality: N/A;   JOINT REPLACEMENT     LAPAROTOMY     for ectopic pregnancy   LAPAROTOMY N/A 06/26/2013   Procedure: EXPLORATORY LAPAROTOMY;  Surgeon: Toribio LITTIE Percy, MD;  Location: [facility];  Service: Gynecology;  Laterality: N/A;   lt knee arthroscopy     rt total hip replacement     TONSILLECTOMY     TOTAL KNEE ARTHROPLASTY Left 12/22/2016   Procedure: LEFT TOTAL KNEE ARTHROPLASTY;  Surgeon: Tanda Heading, MD;  Location: [facility];  Service: Orthopedics;  Laterality: Left;   TRANSTHORACIC ECHOCARDIOGRAM  09/2018   EF 60-65%. Mild AS.  Mod-Severe PAH 60 mmHg   WISDOM TOOTH EXTRACTION     Family History  Problem Relation Age of Onset   Heart disease Father    Thyroid  disease Father    Heart attack Father    Heart disease Sister        open heart surgery   Cancer Sister        breast   Neuropathy Sister        secondary to cancer treatment   Breast cancer Sister    Suicidality Brother 35    Arthritis Mother    Cancer Daughter    Breast cancer Daughter    Social History   Socioeconomic History   Marital status: Married    Spouse name: Not on file   Number of children: 3   Years of education: Not on file   Highest education level: High school graduate  Occupational History   Occupation: Retired    Comment: [employer]  Tobacco Use   Smoking status: Never   Smokeless tobacco: Never  Vaping Use   Vaping status: Never Used  Substance and Sexual Activity   Alcohol use: No   Drug use: No   Sexual activity: Not Currently    Birth control/protection: Post-menopausal  Other Topics Concern   Not  on file  Social History Narrative   Not on file   Social Drivers of Health   Financial Resource Strain: Low Risk  (12/02/2023)   Overall Financial Resource Strain (CARDIA)    Difficulty of Paying Living Expenses: Not hard at all  Food Insecurity: No Food Insecurity (12/02/2023)   Hunger Vital Sign    Worried About Running Out of Food in the Last Year: Never true    Ran Out of Food in the Last Year: Never true  Transportation Needs: No Transportation Needs (12/02/2023)   PRAPARE - Administrator, Civil Service (Medical): No    Lack of Transportation (Non-Medical): No  Physical Activity: Inactive (12/02/2023)   Exercise Vital Sign    Days of Exercise per Week: 0 days    Minutes of Exercise per Session: 0 min  Stress: No Stress Concern Present (12/02/2023)   [employer] of Occupational Health - Occupational Stress Questionnaire    Feeling of Stress : Not at all  Social Connections: Moderately Integrated (12/02/2023)   Social Connection and Isolation Panel [NHANES]    Frequency of Communication with Friends and Family: More than three times a week    Frequency of Social Gatherings with Friends and Family: Three times a week    Attends Religious Services: More than 4 times per year    Active Member of Clubs or Organizations: No    Attends Banker Meetings:  Never    Marital Status: Married    Tobacco Counseling Counseling given: Not Answered   Clinical Intake:  Pre-visit preparation completed: Yes  Pain : No/denies pain     Diabetes: Yes CBG done?: No Did pt. bring in CBG monitor from home?: No  How often do you need to have someone help you when you read instructions, pamphlets, or other written materials from your doctor or pharmacy?: 1 - Never  Interpreter Needed?: No  Information entered by :: Charmaine Bloodgood LPN   Activities of Daily Living    12/02/2023    9:01 AM  In your present state of health, do you have any difficulty performing the following activities:  Hearing? 0  Vision? 0  Difficulty concentrating or making decisions? 0  Walking or climbing stairs? 1  Dressing or bathing? 0  Doing errands, shopping? 1  Preparing Food and eating ? N  Using the Toilet? N  In the past six months, have you accidently leaked urine? N  Do you have problems with loss of bowel control? N  Managing your Medications? N  Managing your Finances? N  Housekeeping or managing your Housekeeping? N    Patient Care Team: Gladis Mustard, FNP as PCP - General (Nurse Practitioner) Anner Alm ORN, MD as PCP - Cardiology (Cardiology) Devonna Sieving, MD as Attending Physician (Gynecology) Heide Ingle, MD as Consulting Physician (Orthopedic Surgery) Anner Alm ORN, MD as Consulting Physician (Cardiology)  Indicate any recent Medical Services you may have received from other than [facility] providers in the past year (date may be approximate).     Assessment:   This is a routine wellness examination for Keva.  Hearing/Vision screen Hearing Screening - Comments:: Denies hearing difficulties     Goals Addressed   None   Depression Screen    12/02/2023    9:03 AM 10/10/2023   11:10 AM 04/04/2023    8:40 AM 01/17/2023    2:35 PM 11/30/2022    9:51 AM 10/19/2022   11:13 AM 09/10/2022    2:53 PM  PHQ  2/9 Scores   PHQ - 2 Score 0 0 0 0 0 0 0  PHQ- 9 Score 0 0 0 0 0 0 0    Fall Risk    12/02/2023    9:00 AM 10/10/2023   11:10 AM 04/04/2023    8:40 AM 01/17/2023    2:35 PM 11/30/2022    9:49 AM  Fall Risk   Falls in the past year? 0 0 0 0 0  Number falls in past yr: 0    0  Injury with Fall? 0    0  Risk for fall due to : Impaired mobility;Impaired balance/gait    No Fall Risks  Follow up Falls prevention discussed;Education provided;Falls evaluation completed    Falls prevention discussed    MEDICARE RISK AT HOME: Medicare Risk at Home Any stairs in or around the home?: No If so, are there any without handrails?: No Home free of loose throw rugs in walkways, pet beds, electrical cords, etc?: Yes Adequate lighting in your home to reduce risk of falls?: Yes Life alert?: No Use of a cane, walker or w/c?: Yes Grab bars in the bathroom?: Yes Shower chair or bench in shower?: Yes Elevated toilet seat or a handicapped toilet?: Yes  TIMED UP AND GO:  Was the test performed?  No    Cognitive Function:    10/30/2018    2:21 PM 10/26/2017   10:37 AM 06/06/2015   10:43 AM  MMSE - Mini Mental State Exam  Orientation to time 5 5 5   Orientation to Place 5 5 5   Registration 3 3 3   Attention/ Calculation 5 5 5   Recall 3 3 3   Language- name 2 objects 2 2 2   Language- repeat 0 1 1  Language- follow 3 step command 3 3 3   Language- read & follow direction 1 1 1   Write a sentence 1 1 1   Copy design 1 1 1   Total score 29 30 30         12/02/2023    9:01 AM 11/30/2022    9:53 AM 11/27/2021   10:09 AM 11/18/2020   10:43 AM 11/14/2019   10:50 AM  6CIT Screen  What Year? 0 points 0 points 0 points 0 points 0 points  What month? 0 points 0 points 0 points 0 points 0 points  What time? 0 points 0 points 0 points 0 points 0 points  Count back from 20 0 points 0 points 0 points 0 points 0 points  Months in reverse 0 points 0 points 0 points 2 points 0 points  Repeat phrase 0 points 0 points 0 points 2  points 0 points  Total Score 0 points 0 points 0 points 4 points 0 points    Immunizations Immunization History  Administered Date(s) Administered   Fluad Quad(high Dose 65+) 08/29/2019, 10/14/2020, 10/16/2021, 10/19/2022   Fluad Trivalent(High Dose 65+) 10/10/2023   Influenza, High Dose Seasonal PF 09/28/2016, 10/06/2017, 10/17/2018   Influenza,inj,Quad PF,6+ Mos 09/26/2013, 09/09/2014, 10/13/2015   Moderna Sars-Covid-2 Vaccination 12/22/2019, 01/19/2020, 11/10/2020   Pneumococcal Conjugate-13 05/01/2015   Pneumococcal Polysaccharide-23 03/29/2002   Td 10/29/2008, 08/31/2011   Tdap 08/31/2011   Zoster Recombinant(Shingrix ) 10/30/2018, 01/15/2021    TDAP status: Due, Education has been provided regarding the importance of this vaccine. Advised may receive this vaccine at local pharmacy or Health Dept. Aware to provide a copy of the vaccination record if obtained from local pharmacy or Health Dept. Verbalized acceptance and understanding.  Flu Vaccine status: Up  to date  Pneumococcal vaccine status: Up to date  Covid-19 vaccine status: Information provided on how to obtain vaccines.   Qualifies for Shingles Vaccine? Yes   Zostavax completed No   Shingrix  Completed?: Yes  Screening Tests Health Maintenance  Topic Date Due   OPHTHALMOLOGY EXAM  07/23/2021   COVID-19 Vaccine (4 - 2024-25 season) 07/31/2023   DTaP/Tdap/Td (4 - Td or Tdap) 10/09/2024 (Originally 08/30/2021)   FOOT EXAM  01/18/2024   HEMOGLOBIN A1C  04/08/2024   Medicare Annual Wellness (AWV)  12/01/2024   Pneumonia Vaccine 61+ Years old  Completed   INFLUENZA VACCINE  Completed   DEXA SCAN  Completed   Zoster Vaccines- Shingrix   Completed   HPV VACCINES  Aged Out    Health Maintenance  Health Maintenance Due  Topic Date Due   OPHTHALMOLOGY EXAM  07/23/2021   COVID-19 Vaccine (4 - 2024-25 season) 07/31/2023    Colorectal cancer screening: No longer required.   Mammogram status: No longer required due  to age and preference.  Bone Density status: Ordered today. Pt provided with contact info and advised to call to schedule appt.  Lung Cancer Screening: (Low Dose CT Chest recommended if Age 10-80 years, 20 pack-year currently smoking OR have quit w/in 15years.) does not qualify.   Lung Cancer Screening Referral: n/a  Additional Screening:  Hepatitis C Screening: does not qualify  Vision Screening: Recommended annual ophthalmology exams for early detection of glaucoma and other disorders of the eye. Is the patient up to date with their annual eye exam?  No  Who is the provider or what is the name of the office in which the patient attends annual eye exams? none If pt is not established with a provider, would they like to be referred to a provider to establish care? No .   Dental Screening: Recommended annual dental exams for proper oral hygiene  Diabetic Foot Exam: Diabetic Foot Exam: Completed 01/17/23  Community Resource Referral / Chronic Care Management: CRR required this visit?  No   CCM required this visit?  No     Plan:     I have personally reviewed and noted the following in the patient's chart:   Medical and social history Use of alcohol, tobacco or illicit drugs  Current medications and supplements including opioid prescriptions. Patient is not currently taking opioid prescriptions. Functional ability and status Nutritional status Physical activity Advanced directives List of other physicians Hospitalizations, surgeries, and ER visits in previous 12 months Vitals Screenings to include cognitive, depression, and falls Referrals and appointments  In addition, I have reviewed and discussed with patient certain preventive protocols, quality metrics, and best practice recommendations. A written personalized care plan for preventive services as well as general preventive health recommendations were provided to patient.     Lavelle Pfeiffer [place], [place]   07/04/7973    After Visit Summary: (Mail) Due to this being a telephonic visit, the after visit summary with patients personalized plan was offered to patient via mail   Nurse Notes: No concerns at this time

## 2023-12-12 ENCOUNTER — Other Ambulatory Visit: Payer: Self-pay | Admitting: Nurse Practitioner

## 2023-12-12 DIAGNOSIS — I4821 Permanent atrial fibrillation: Secondary | ICD-10-CM

## 2023-12-12 DIAGNOSIS — I152 Hypertension secondary to endocrine disorders: Secondary | ICD-10-CM

## 2023-12-12 DIAGNOSIS — E1169 Type 2 diabetes mellitus with other specified complication: Secondary | ICD-10-CM

## 2023-12-12 MED ORDER — RIVAROXABAN 20 MG PO TABS: ORAL_TABLET | ORAL | 0 refills | Status: DC

## 2023-12-12 NOTE — Telephone Encounter (Signed)
 Copied from CRM [id number]. Topic: Clinical - Medication Refill >> Dec 12, 2023 11:06 AM Bascom RAMAN wrote: Most Recent Primary Care Visit:  Provider: LAVELLE CHARMAINE NOVAK  Department: [facility]  Visit Type: MEDICARE AWV, SEQUENTIAL  Date: 12/02/2023  Medication: rivaroxaban  (XARELTO ) 20 MG TABS tablet  Has the patient contacted their pharmacy? Yes (Agent: If no, request that the patient contact the pharmacy for the refill. If patient does not wish to contact the pharmacy document the reason why and proceed with request.) (Agent: If yes, when and what did the pharmacy advise?)  Is this the correct pharmacy for this prescription? Yes If no, delete pharmacy and type the correct one.  This is the patient's preferred pharmacy:  [facility] - [place], [place] - [address] Portal to Registered [employer] Sites [address] [place] [address] Phone: [phone] Fax: [phone]    Has the prescription been filled recently? No  Is the patient out of the medication? No  Has the patient been seen for an appointment in the last year OR does the patient have an upcoming appointment? Yes  Can we respond through MyChart? No  Agent: Please be advised that Rx refills may take up to 3 business days. We ask that you follow-up with your pharmacy.

## 2023-12-15 ENCOUNTER — Other Ambulatory Visit: Payer: Self-pay | Admitting: *Deleted

## 2023-12-15 DIAGNOSIS — I4821 Permanent atrial fibrillation: Secondary | ICD-10-CM

## 2023-12-15 DIAGNOSIS — E1159 Type 2 diabetes mellitus with other circulatory complications: Secondary | ICD-10-CM

## 2023-12-15 DIAGNOSIS — E1169 Type 2 diabetes mellitus with other specified complication: Secondary | ICD-10-CM

## 2023-12-15 MED ORDER — RIVAROXABAN 20 MG PO TABS: ORAL_TABLET | ORAL | 0 refills | Status: DC

## 2024-01-04 ENCOUNTER — Other Ambulatory Visit: Payer: Self-pay | Admitting: Nurse Practitioner

## 2024-01-04 DIAGNOSIS — E1142 Type 2 diabetes mellitus with diabetic polyneuropathy: Secondary | ICD-10-CM

## 2024-01-04 MED ORDER — GABAPENTIN 300 MG PO CAPS: 300.0000 mg | ORAL_CAPSULE | Freq: Two times a day (BID) | ORAL | 0 refills | Status: DC

## 2024-01-04 NOTE — Telephone Encounter (Signed)
Last Fill: 10/10/23  Last OV: 10/13/23 Next OV: 01/09/24  Routing to provider for review/authorization.

## 2024-01-09 ENCOUNTER — Ambulatory Visit (INDEPENDENT_AMBULATORY_CARE_PROVIDER_SITE_OTHER): Payer: Medicare Other

## 2024-01-09 ENCOUNTER — Encounter: Payer: Self-pay | Admitting: Nurse Practitioner

## 2024-01-09 ENCOUNTER — Ambulatory Visit (INDEPENDENT_AMBULATORY_CARE_PROVIDER_SITE_OTHER): Payer: Medicare Other | Admitting: Nurse Practitioner

## 2024-01-09 VITALS — BP 142/74 | HR 90 | Temp 98.2°F | Ht 64.0 in | Wt 195.0 lb

## 2024-01-09 DIAGNOSIS — C541 Malignant neoplasm of endometrium: Secondary | ICD-10-CM | POA: Diagnosis not present

## 2024-01-09 DIAGNOSIS — E119 Type 2 diabetes mellitus without complications: Secondary | ICD-10-CM

## 2024-01-09 DIAGNOSIS — E034 Atrophy of thyroid (acquired): Secondary | ICD-10-CM | POA: Diagnosis not present

## 2024-01-09 DIAGNOSIS — E1142 Type 2 diabetes mellitus with diabetic polyneuropathy: Secondary | ICD-10-CM | POA: Diagnosis not present

## 2024-01-09 DIAGNOSIS — L02419 Cutaneous abscess of limb, unspecified: Secondary | ICD-10-CM

## 2024-01-09 DIAGNOSIS — I4821 Permanent atrial fibrillation: Secondary | ICD-10-CM | POA: Diagnosis not present

## 2024-01-09 DIAGNOSIS — M810 Age-related osteoporosis without current pathological fracture: Secondary | ICD-10-CM | POA: Diagnosis not present

## 2024-01-09 DIAGNOSIS — E1159 Type 2 diabetes mellitus with other circulatory complications: Secondary | ICD-10-CM

## 2024-01-09 DIAGNOSIS — Z780 Asymptomatic menopausal state: Secondary | ICD-10-CM | POA: Diagnosis not present

## 2024-01-09 DIAGNOSIS — Z7984 Long term (current) use of oral hypoglycemic drugs: Secondary | ICD-10-CM

## 2024-01-09 DIAGNOSIS — E785 Hyperlipidemia, unspecified: Secondary | ICD-10-CM | POA: Diagnosis not present

## 2024-01-09 DIAGNOSIS — I152 Hypertension secondary to endocrine disorders: Secondary | ICD-10-CM | POA: Diagnosis not present

## 2024-01-09 DIAGNOSIS — R600 Localized edema: Secondary | ICD-10-CM

## 2024-01-09 DIAGNOSIS — E1169 Type 2 diabetes mellitus with other specified complication: Secondary | ICD-10-CM | POA: Diagnosis not present

## 2024-01-09 DIAGNOSIS — M8588 Other specified disorders of bone density and structure, other site: Secondary | ICD-10-CM

## 2024-01-09 DIAGNOSIS — L03119 Cellulitis of unspecified part of limb: Secondary | ICD-10-CM

## 2024-01-09 LAB — BAYER DCA HB A1C WAIVED: HB A1C (BAYER DCA - WAIVED): 6.4 % — ABNORMAL HIGH (ref 4.8–5.6)

## 2024-01-09 LAB — LIPID PANEL

## 2024-01-09 MED ORDER — LEVOTHYROXINE SODIUM 125 MCG PO TABS: 125.0000 ug | ORAL_TABLET | Freq: Every day | ORAL | 1 refills | Status: DC

## 2024-01-09 MED ORDER — FUROSEMIDE 40 MG PO TABS: 80.0000 mg | ORAL_TABLET | Freq: Two times a day (BID) | ORAL | 1 refills | Status: DC

## 2024-01-09 MED ORDER — METOPROLOL SUCCINATE ER 25 MG PO TB24: 25.0000 mg | ORAL_TABLET | Freq: Every day | ORAL | 1 refills | Status: DC

## 2024-01-09 MED ORDER — METFORMIN HCL 500 MG PO TABS: 500.0000 mg | ORAL_TABLET | Freq: Two times a day (BID) | ORAL | 1 refills | Status: DC

## 2024-01-09 MED ORDER — DOXYCYCLINE HYCLATE 100 MG PO TABS: 100.0000 mg | ORAL_TABLET | Freq: Two times a day (BID) | ORAL | 0 refills | Status: DC

## 2024-01-09 MED ORDER — GABAPENTIN 300 MG PO CAPS: 300.0000 mg | ORAL_CAPSULE | Freq: Two times a day (BID) | ORAL | 1 refills | Status: DC

## 2024-01-09 MED ORDER — RIVAROXABAN 20 MG PO TABS
ORAL_TABLET | ORAL | 1 refills | Status: DC
Start: 2024-01-09 — End: 2024-07-10

## 2024-01-09 NOTE — Progress Notes (Addendum)
 Subjective:    Patient ID: Debra Campbell, female    DOB: 1932/11/03, 89 y.o.   MRN: [id number]   Chief Complaint: medical management of chronic issues     HPI:  Debra Campbell is a 89 y.o. who identifies as a female who was assigned female at birth.   Social history: Lives with: husband Work history: retired   Water engineer in today for follow up of the following chronic medical issues:  1. Hypertension associated with type 2 diabetes mellitus (HCC) No c/o chest pain, sob or headache. Does not check blood presure at home. BP Readings from Last 3 Encounters:  10/14/23 128/68  10/13/23 125/76  10/10/23 (!) 148/73     2. Permanent atrial fibrillation (HCC) CHA2DS2Vasc = 5; Xarelto  No palpitations or heart racing  3. Diabetes mellitus treated with oral medication (HCC) Does not check blood sugars very often at home. Lab Results  Component Value Date   HGBA1C 6.2 (H) 10/10/2023     4. Diabetic polyneuropathy associated with type 2 diabetes mellitus (HCC) Has some numbness in toes  5. Hyperlipidemia associated with type 2 diabetes mellitus (HCC) Does not really watch diet and does no dedicated exercise Lab Results  Component Value Date   CHOL 171 10/10/2023   HDL 60 10/10/2023   LDLCALC 90 10/10/2023   TRIG 116 10/10/2023   CHOLHDL 2.9 10/10/2023     6. Hypothyroidism due to acquired atrophy of thyroid  No issues that she is aware Lab Results  Component Value Date   TSH 0.946 07/19/2022     7. Peripheral edema Has edema daiLy  8. Endometrial cancer (HCC) No reoccurrence. No longer sees oncology  9. Osteopenia of lumbar spine Last dexascan was done on 01/21/21. T score was -2.0  10. Morbid obesity (HCC) No recent weight changes  Wt Readings from Last 3 Encounters:  01/09/24 195 lb (88.5 kg)  12/02/23 193 lb (87.5 kg)  10/14/23 193 lb (87.5 kg)   BMI Readings from Last 3 Encounters:  01/09/24 33.47 kg/m  12/02/23 33.13 kg/m  10/14/23 33.13 kg/m        New complaints: Sores all over forearms and legs- she has been picking and scratching at areas.   Allergies  Allergen Reactions   Ace Inhibitors Cough   Iohexol      Desc: HIVES 40 YEARS AGO    Januvia  [Sitagliptin ] Other (See Comments)    Constipation and body aches   Statins     myalgia   Penicillins Swelling and Rash    Has patient had a PCN reaction causing immediate rash, facial/tongue/throat swelling, SOB or lightheadedness with hypotension: Yes Has patient had a PCN reaction causing severe rash involving mucus membranes or skin necrosis: No Has patient had a PCN reaction that required hospitalization Yes Has patient had a PCN reaction occurring within the last 10 years: No If all of the above answers are "NO", then may proceed with Cephalosporin use.    Sulfa Antibiotics Rash   Outpatient Encounter Medications as of 01/09/2024  Medication Sig   Accu-Chek Softclix Lancets lancets Check BS daily Dx E11.42   acetaminophen  (TYLENOL ) 500 MG tablet Take 1 tablet (500 mg total) by mouth every 8 (eight) hours as needed.   Blood Glucose Monitoring Suppl (ACCU-CHEK GUIDE ME) w/Device KIT TEST BS TID DX E11.42   furosemide  (LASIX ) 40 MG tablet Take 2 tablets (80 mg total) by mouth 2 (two) times daily. TAKE 2 TABLETS TWICE A DAY.MAY TAKE AN ADDITIONAL  1 TABLET IF WEIGHT GAIN OF 3LBS OR MORE OVERNIGHT.   gabapentin  (NEURONTIN ) 300 MG capsule Take 1 capsule (300 mg total) by mouth 2 (two) times daily.   glucose blood (ACCU-CHEK GUIDE) test strip TEST BS TID DX E11.42   levothyroxine  (SYNTHROID ) 125 MCG tablet Take 1 tablet (125 mcg total) by mouth daily.   metFORMIN  (GLUCOPHAGE ) 500 MG tablet Take 1 tablet (500 mg total) by mouth 2 (two) times daily with a meal.   metolazone  (ZAROXOLYN ) 2.5 MG tablet 1 po for 2 days then prn   metoprolol  succinate (TOPROL -XL) 25 MG 24 hr tablet Take 1 tablet (25 mg total) by mouth daily.   nystatin  cream (MYCOSTATIN ) Apply 1 Application  topically 2 (two) times daily.   rivaroxaban  (XARELTO ) 20 MG TABS tablet TAKE 1 TABLET BY MOUTH EVERY DAY WITH BREAKFAST   No facility-administered encounter medications on file as of 01/09/2024.    Past Surgical History:  Procedure Laterality Date   ABDOMINAL HYSTERECTOMY Bilateral 06/26/2013   Procedure: TOTAL ABDOMINAL HYSTERECTOMY WITH BILATERAL SALPINGO OOPHERECTOMY WITH PELVIC LYMPHADNECTOMY;  Surgeon: Verdia Glad, MD;  Location: [facility];  Service: Gynecology;  Laterality: Bilateral;   BACK SURGERY  06/26/2013   ECTOPIC PREGNANCY SURGERY     HERNIA REPAIR     HYSTEROSCOPY WITH D & C N/A 04/26/2013   Procedure: DILATATION AND CURETTAGE /HYSTEROSCOPY;  Surgeon: Tresia Fruit, MD;  Location: [facility];  Service: Gynecology;  Laterality: N/A;   JOINT REPLACEMENT     LAPAROTOMY     for ectopic pregnancy   LAPAROTOMY N/A 06/26/2013   Procedure: EXPLORATORY LAPAROTOMY;  Surgeon: Verdia Glad, MD;  Location: [facility];  Service: Gynecology;  Laterality: N/A;   lt knee arthroscopy     rt total hip replacement     TONSILLECTOMY     TOTAL KNEE ARTHROPLASTY Left 12/22/2016   Procedure: LEFT TOTAL KNEE ARTHROPLASTY;  Surgeon: Hazle Lites, MD;  Location: [facility];  Service: Orthopedics;  Laterality: Left;   TRANSTHORACIC ECHOCARDIOGRAM  09/2018   EF 60-65%. Mild AS.  Mod-Severe PAH 60 mmHg   WISDOM TOOTH EXTRACTION      Family History  Problem Relation Age of Onset   Heart disease Father    Thyroid  disease Father    Heart attack Father    Heart disease Sister        open heart surgery   Cancer Sister        breast   Neuropathy Sister        secondary to cancer treatment   Breast cancer Sister    Suicidality Brother 9   Arthritis Mother    Cancer Daughter    Breast cancer Daughter       Controlled substance contract: n/a     Review of Systems     Objective:   Physical Exam Vitals and nursing note reviewed.  Constitutional:      General: She is not in  acute distress.    Appearance: Normal appearance. She is well-developed.  HENT:     Head: Normocephalic.     Right Ear: Tympanic membrane normal.     Left Ear: Tympanic membrane normal.     Nose: Nose normal.     Mouth/Throat:     Mouth: Mucous membranes are moist.  Eyes:     Pupils: Pupils are equal, round, and reactive to light.  Neck:     Vascular: No carotid bruit or JVD.  Cardiovascular:     Rate and  Rhythm: Normal rate. Rhythm irregular.     Heart sounds: Murmur (3/6) heard.  Pulmonary:     Effort: Pulmonary effort is normal. No respiratory distress.     Breath sounds: Normal breath sounds. No wheezing or rales.  Chest:     Chest wall: No tenderness.  Abdominal:     General: Bowel sounds are normal. There is no distension or abdominal bruit.     Palpations: Abdomen is soft. There is no hepatomegaly, splenomegaly, mass or pulsatile mass.     Tenderness: There is no abdominal tenderness.  Musculoskeletal:        General: Normal range of motion.     Cervical back: Normal range of motion and neck supple.     Right lower leg: Edema (3+) present.     Left lower leg: Edema (3+) present.  Lymphadenopathy:     Cervical: No cervical adenopathy.  Skin:    General: Skin is warm and dry.     Coloration: Skin is pale.  Neurological:     Mental Status: She is alert and oriented to person, place, and time.     Deep Tendon Reflexes: Reflexes are normal and symmetric.  Psychiatric:        Behavior: Behavior normal.        Thought Content: Thought content normal.        Judgment: Judgment normal.     BP (!) 142/74   Pulse 90   Temp 98.2 F (36.8 C) (Temporal)   Ht 5\' 4"  (1.626 m)   Wt 195 lb (88.5 kg)   SpO2 98%   BMI 33.47 kg/m   HGBa1c 6.4%       Assessment & Plan:  Debra Campbell comes in today with chief complaint of medical management of chronic issues    Diagnosis and orders addressed:  1. Hypertension associated with type 2 diabetes mellitus (HCC) Low  sodium diet - CBC with Differential/Platelet - CMP14+EGFR - rivaroxaban  (XARELTO ) 20 MG TABS tablet; TAKE 1 TABLET BY MOUTH EVERY DAY WITH BREAKFAST  Dispense: 90 tablet; Refill: 1 - metoprolol  succinate (TOPROL -XL) 25 MG 24 hr tablet; Take 1 tablet (25 mg total) by mouth daily.  Dispense: 90 tablet; Refill: 1  2. Permanent atrial fibrillation (HCC) CHA2DS2Vasc = 5; Xarelto  Avoid caffeine - rivaroxaban  (XARELTO ) 20 MG TABS tablet; TAKE 1 TABLET BY MOUTH EVERY DAY WITH BREAKFAST  Dispense: 90 tablet; Refill: 1  3. Diabetes mellitus treated with oral medication (HCC) Continue to watch carbs in diet - Bayer DCA Hb A1c Waived - metFORMIN  (GLUCOPHAGE ) 500 MG tablet; Take 1 tablet (500 mg total) by mouth 2 (two) times daily with a meal.  Dispense: 180 tablet; Refill: 1  4. Diabetic polyneuropathy associated with type 2 diabetes mellitus (HCC) Do not go barefooted - gabapentin  (NEURONTIN ) 300 MG capsule; Take 1 capsule (300 mg total) by mouth 2 (two) times daily.  Dispense: 180 capsule; Refill: 1  5. Hyperlipidemia associated with type 2 diabetes mellitus (HCC) Low fat diet - Lipid panel - rivaroxaban  (XARELTO ) 20 MG TABS tablet; TAKE 1 TABLET BY MOUTH EVERY DAY WITH BREAKFAST  Dispense: 90 tablet; Refill: 1  6. Hypothyroidism due to acquired atrophy of thyroid  Labs pending - levothyroxine  (SYNTHROID ) 125 MCG tablet; Take 1 tablet (125 mcg total) by mouth daily.  Dispense: 90 tablet; Refill: 1  7. Peripheral edema Take zaroxlyn for 2 days - metolazone  (ZAROXOLYN ) 2.5 MG tablet; 1 po for 2 days then prn  Dispense: 20 tablet; Refill: 0 -  Apply unna boot - furosemide  (LASIX ) 40 MG tablet; Take 2 tablets (80 mg total) by mouth 2 (two) times daily. TAKE 2 TABLETS TWICE A DAY.MAY TAKE AN ADDITIONAL     1 TABLET IF WEIGHT GAIN OF 3LBS OR MORE OVERNIGHT.  Dispense: 360 tablet; Refill: 1  8. Endometrial cancer (HCC)  9. Osteopenia of lumbar spine Weight bearing exercise  10. Morbid obesity  (HCC) Discussed diet and exercise for person with BMI >25 Will recheck weight in 3-6 months  11. Cellulitis bil lower ext Bactrim DS 1 p o BID for 10 days Avoid scratching Moisturizer  Labs pending Health Maintenance reviewed Diet and exercise encouraged  Follow up plan: 3 days   [name] Gaylyn Keas, FNP

## 2024-01-09 NOTE — Patient Instructions (Signed)

## 2024-01-10 LAB — CBC WITH DIFFERENTIAL/PLATELET
Basophils Absolute: 0.1 10*3/uL (ref 0.0–0.2)
Basos: 2 %
EOS (ABSOLUTE): 0.2 10*3/uL (ref 0.0–0.4)
Eos: 5 %
Hematocrit: 37.3 % (ref 34.0–46.6)
Hemoglobin: 12.7 g/dL (ref 11.1–15.9)
Immature Grans (Abs): 0 10*3/uL (ref 0.0–0.1)
Immature Granulocytes: 0 %
Lymphocytes Absolute: 1.4 10*3/uL (ref 0.7–3.1)
Lymphs: 30 %
MCH: 32.3 pg (ref 26.6–33.0)
MCHC: 34 g/dL (ref 31.5–35.7)
MCV: 95 fL (ref 79–97)
Monocytes Absolute: 0.5 10*3/uL (ref 0.1–0.9)
Monocytes: 10 %
Neutrophils Absolute: 2.5 10*3/uL (ref 1.4–7.0)
Neutrophils: 53 %
Platelets: 199 10*3/uL (ref 150–450)
RBC: 3.93 x10E6/uL (ref 3.77–5.28)
RDW: 12.6 % (ref 11.7–15.4)
WBC: 4.7 10*3/uL (ref 3.4–10.8)

## 2024-01-10 LAB — LIPID PANEL
Cholesterol, Total: 188 mg/dL (ref 100–199)
HDL: 63 mg/dL (ref >39)
LDL CALC COMMENT:: 3 ratio (ref 0.0–4.4)
LDL Chol Calc (NIH): 100 mg/dL — ABNORMAL HIGH (ref 0–99)
Triglycerides: 144 mg/dL (ref 0–149)
VLDL Cholesterol Cal: 25 mg/dL (ref 5–40)

## 2024-01-10 LAB — CMP14+EGFR
ALT: 12 [IU]/L (ref 0–32)
AST: 24 [IU]/L (ref 0–40)
Albumin: 4 g/dL (ref 3.7–4.7)
Alkaline Phosphatase: 67 [IU]/L (ref 44–121)
BUN/Creatinine Ratio: 11 — ABNORMAL LOW (ref 12–28)
BUN: 13 mg/dL (ref 8–27)
Bilirubin Total: 0.7 mg/dL (ref 0.0–1.2)
CO2: 21 mmol/L (ref 20–29)
Calcium: 9.3 mg/dL (ref 8.7–10.3)
Chloride: 106 mmol/L (ref 96–106)
Creatinine, Ser: 1.2 mg/dL — ABNORMAL HIGH (ref 0.57–1.00)
Globulin, Total: 3 g/dL (ref 1.5–4.5)
Glucose: 153 mg/dL — ABNORMAL HIGH (ref 70–99)
Potassium: 4.2 mmol/L (ref 3.5–5.2)
Sodium: 146 mmol/L — ABNORMAL HIGH (ref 134–144)
Total Protein: 7 g/dL (ref 6.0–8.5)
eGFR: 43 mL/min/{1.73_m2} — ABNORMAL LOW (ref >59)

## 2024-03-20 ENCOUNTER — Other Ambulatory Visit: Payer: Self-pay | Admitting: Nurse Practitioner

## 2024-03-20 DIAGNOSIS — R600 Localized edema: Secondary | ICD-10-CM

## 2024-04-16 NOTE — Progress Notes (Deleted)
 Cardiology Clinic Note   Patient Name: Debra Campbell Date of Encounter: 04/16/2024  Primary Care Provider:  Delfina Feller, FNP Primary Cardiologist:  Randene Bustard, MD  Patient Profile    Debra Campbell 89 year old female presents the clinic today for follow-up evaluation of her hypertension and atrial fibrillation.  Past Medical History    Past Medical History:  Diagnosis Date   Aortic calcification (HCC) 2013   Atherosclerotic calcifications of the abdominal aorta and branch noted on CT Abd/Pelvis   Arthritis    legs, lower back, hands   Cancer (HCC)    endometrial   Diabetes mellitus    Ectopic pregnancy    Eczema    Headache(784.0)    hx of   History of kidney stones    Hyperlipidemia    no meds   Hypertension    Hypothyroidism    Persistent atrial fibrillation (HCC) 09/28/2016   Relatively new Dx: Asymptomatic.  Rate controlled without medication.   SVD (spontaneous vaginal delivery)    x 3   Thyroid  disease    Past Surgical History:  Procedure Laterality Date   ABDOMINAL HYSTERECTOMY Bilateral 06/26/2013   Procedure: TOTAL ABDOMINAL HYSTERECTOMY WITH BILATERAL SALPINGO OOPHERECTOMY WITH PELVIC LYMPHADNECTOMY;  Surgeon: Verdia Glad, MD;  Location: [facility];  Service: Gynecology;  Laterality: Bilateral;   BACK SURGERY  06/26/2013   ECTOPIC PREGNANCY SURGERY     HERNIA REPAIR     HYSTEROSCOPY WITH D & C N/A 04/26/2013   Procedure: DILATATION AND CURETTAGE /HYSTEROSCOPY;  Surgeon: Tresia Fruit, MD;  Location: [facility];  Service: Gynecology;  Laterality: N/A;   JOINT REPLACEMENT     LAPAROTOMY     for ectopic pregnancy   LAPAROTOMY N/A 06/26/2013   Procedure: EXPLORATORY LAPAROTOMY;  Surgeon: Verdia Glad, MD;  Location: [facility];  Service: Gynecology;  Laterality: N/A;   lt knee arthroscopy     rt total hip replacement     TONSILLECTOMY     TOTAL KNEE ARTHROPLASTY Left 12/22/2016   Procedure: LEFT TOTAL KNEE ARTHROPLASTY;   Surgeon: Hazle Lites, MD;  Location: [facility];  Service: Orthopedics;  Laterality: Left;   TRANSTHORACIC ECHOCARDIOGRAM  09/2018   EF 60-65%. Mild AS.  Mod-Severe PAH 60 mmHg   WISDOM TOOTH EXTRACTION      Allergies  Allergies  Allergen Reactions   Ace Inhibitors Cough   Iohexol      Desc: HIVES 40 YEARS AGO    Januvia  [Sitagliptin ] Other (See Comments)    Constipation and body aches   Statins     myalgia   Penicillins Swelling and Rash    Has patient had a PCN reaction causing immediate rash, facial/tongue/throat swelling, SOB or lightheadedness with hypotension: Yes Has patient had a PCN reaction causing severe rash involving mucus membranes or skin necrosis: No Has patient had a PCN reaction that required hospitalization Yes Has patient had a PCN reaction occurring within the last 10 years: No If all of the above answers are "NO", then may proceed with Cephalosporin use.    Sulfa Antibiotics Rash    History of Present Illness    WAVE CALZADA has a PMH of HTN, atrial fibrillation CHA2DS2-VASc score 5, aortic calcification, mild aortic stenosis, HLD, hypothyroidism, diabetic neuropathy, osteoarthritis, obesity, peripheral edema, and is status post left total knee replacement.  She was seen in the emergency department 11/28/2021 with nephrolithiasis/ureterolithiasis.  She reported flank pain with hematuria.  She was noted to have a left  ureteral stone with hydronephrosis.  She was treated with analgesia and narcotics.  She was discharged on Flomax  and referred to urology.  She was seen in follow-up by Dr. Addie Holstein on 04/12/2022.  During that time she continued to be stable from a cardiac standpoint.  She noted some heart fluttering with getting upset and exertion.  She was not very active and was using a cane in the house and a walker for longer distances.  She was limited in her mobility due to back and hip pain as well as knee pain.    She presented to the clinic 01/06/23 for  follow-up evaluation and stated she continued to use her walker with ambulation.  She was no longer using her cane and was using her walker in her house due to her back and left lower extremity pain.  This  limited her physical activity.  Her blood pressure was well-controlled at 128/78.  Her EKG showed atrial fibrillation 74 bpm.  Her biggest complaint was with her refills of her medications.  She had trouble with [facility].  She requested 90-day supply and 3 refills.  I asked her to increase the fiber in her diet, we continued her  medication regimen and planned follow-up in 9 months.  She presented to the clinic 10/14/23 for follow-up evaluation and stated she felt like she was doing fairly well.  She had a pair of Unna boots on her bilateral lower extremities.  She believed that she had an allergic reaction to the boots.  She had multiple areas of 0.5 cm x 0.5 cm round erythema.  She was prescribed doxycycline  for these.  I  instructed her to keep the area clean and dry.  We reviewed the importance of lower extremity support stockings and daily weights.  She expressed understanding.  Her blood pressure was not well-controlled .  Her heart rate was rate controlled.  She reported compliance with her Xarelto  and denied bleeding issues.  I gave her the Wing support stocking sheet, had her maintain her physical activity and planned follow-up in 6 months.  She presents to the clinic today for follow-up evaluation and states***.  Today she denies chest pain, shortness of breath, lower extremity edema, fatigue, palpitations, melena, hematuria, hemoptysis, diaphoresis, weakness, presyncope, syncope, orthopnea, and PND.       Home Medications    Prior to Admission medications   Medication Sig Start Date End Date Taking? Authorizing Provider  Accu-Chek Softclix Lancets lancets CHECK BLOOD SUGAR ONCE DAILY DX E11.42 09/11/21   Gaylyn Keas, [name], FNP  acetaminophen  (TYLENOL ) 500 MG tablet Take 1 tablet  (500 mg total) by mouth every 8 (eight) hours as needed. 10/13/18   Lavaughn Portland, MD  furosemide  (LASIX ) 40 MG tablet Take 2 tablets (80 mg total) by mouth 2 (two) times daily. MAY TAKE AN ADDITIONAL 40 MG IF WEIGHT GAIN OF 3 LBS OR MORE OVER NIGHT 07/19/22 01/15/23  Delfina Feller, FNP  gabapentin  (NEURONTIN ) 300 MG capsule Take 1 capsule (300 mg total) by mouth 2 (two) times daily. 07/19/22   Gaylyn Keas [name], FNP  glucose blood (ACCU-CHEK GUIDE) test strip CHECK BLOOD SUGAR ONCE DAILY DX E11.42 06/08/21   Delfina Feller, FNP  levothyroxine  (SYNTHROID ) 125 MCG tablet Take 1 tablet (125 mcg total) by mouth daily. 07/19/22   Delfina Feller, FNP  metFORMIN  (GLUCOPHAGE ) 500 MG tablet Take 1 tablet (500 mg total) by mouth 2 (two) times daily with a meal. 10/25/22   Gaylyn Keas, [name], FNP  metoprolol  succinate (TOPROL -XL) 25 MG  24 hr tablet Take 1 tablet (25 mg total) by mouth daily. 07/19/22   Gaylyn Keas [name], FNP  nystatin  cream (MYCOSTATIN ) Apply 1 Application topically 2 (two) times daily. 10/19/22   Delfina Feller, FNP  rivaroxaban  (XARELTO ) 20 MG TABS tablet TAKE 1 TABLET BY MOUTH EVERY DAY WITH BREAKFAST 12/10/22   Arleen Lacer, MD    Family History    Family History  Problem Relation Age of Onset   Heart disease Father    Thyroid  disease Father    Heart attack Father    Heart disease Sister        open heart surgery   Cancer Sister        breast   Neuropathy Sister        secondary to cancer treatment   Breast cancer Sister    Suicidality Brother 22   Arthritis Mother    Cancer Daughter    Breast cancer Daughter    She indicated that her mother is deceased. She indicated that her father is deceased. She indicated that her sister is alive. She indicated that her brother is deceased. She indicated that only one of her two daughters is alive. She indicated that her son is alive.  Social History    Social History   Socioeconomic History    Marital status: Married    Spouse name: Not on file   Number of children: 3   Years of education: Not on file   Highest education level: High school graduate  Occupational History   Occupation: Retired    Comment: [employer]  Tobacco Use   Smoking status: Never   Smokeless tobacco: Never  Vaping Use   Vaping status: Never Used  Substance and Sexual Activity   Alcohol use: No   Drug use: No   Sexual activity: Not Currently    Birth control/protection: Post-menopausal  Other Topics Concern   Not on file  Social History Narrative   Not on file   Social Drivers of Health   Financial Resource Strain: Low Risk  (12/02/2023)   Overall Financial Resource Strain (CARDIA)    Difficulty of Paying Living Expenses: Not hard at all  Food Insecurity: No Food Insecurity (12/02/2023)   Hunger Vital Sign    Worried About Running Out of Food in the Last Year: Never true    Ran Out of Food in the Last Year: Never true  Transportation Needs: No Transportation Needs (12/02/2023)   PRAPARE - Administrator, Civil Service (Medical): No    Lack of Transportation (Non-Medical): No  Physical Activity: Inactive (12/02/2023)   Exercise Vital Sign    Days of Exercise per Week: 0 days    Minutes of Exercise per Session: 0 min  Stress: No Stress Concern Present (12/02/2023)   [employer] of Occupational Health - Occupational Stress Questionnaire    Feeling of Stress : Not at all  Social Connections: Moderately Integrated (12/02/2023)   Social Connection and Isolation Panel [NHANES]    Frequency of Communication with Friends and Family: More than three times a week    Frequency of Social Gatherings with Friends and Family: Three times a week    Attends Religious Services: More than 4 times per year    Active Member of Clubs or Organizations: No    Attends Banker Meetings: Never    Marital Status: Married  Catering manager Violence: Not At Risk (12/02/2023)   Humiliation, Afraid, Rape,  and Kick questionnaire    Fear  of Current or Ex-Partner: No    Emotionally Abused: No    Physically Abused: No    Sexually Abused: No     Review of Systems    General:  No chills, fever, night sweats or weight changes.  Cardiovascular:  No chest pain, dyspnea on exertion, edema, orthopnea, palpitations, paroxysmal nocturnal dyspnea. Dermatological: No rash, lesions/masses Respiratory: No cough, dyspnea Urologic: No hematuria, dysuria Abdominal:   No nausea, vomiting, diarrhea, bright red blood per rectum, melena, or hematemesis Neurologic:  No visual changes, wkns, changes in mental status. All other systems reviewed and are otherwise negative except as noted above.  Physical Exam    VS:  There were no vitals taken for this visit. , BMI There is no height or weight on file to calculate BMI. GEN: Well nourished, well developed, in no acute distress. HEENT: normal. Neck: Supple, no JVD, carotid bruits, or masses. Cardiac: Irregularly irregular, no murmurs, rubs, or gallops. No clubbing, cyanosis, right greater than left lower extremity nonpitting edema.  Radials/DP/PT 2+ and equal bilaterally.  Respiratory:  Respirations regular and unlabored, clear to auscultation bilaterally. GI: Soft, nontender, nondistended, BS + x 4. MS: no deformity or atrophy. Skin: warm and dry, no rash.   Neuro:  Strength and sensation are intact. Psych: Normal affect.  Accessory Clinical Findings    Recent Labs: 01/09/2024: ALT 12; BUN 13; Creatinine, Ser 1.20; Hemoglobin 12.7; Platelets 199; Potassium 4.2; Sodium 146   Recent Lipid Panel    Component Value Date/Time   CHOL 188 01/09/2024 1027   CHOL 230 (H) 03/19/2013 1034   TRIG 144 01/09/2024 1027   TRIG 253 (H) 05/01/2015 0913   TRIG 149 03/19/2013 1034   HDL 63 01/09/2024 1027   HDL 42 05/01/2015 0913   HDL 52 03/19/2013 1034   CHOLHDL 3.0 01/09/2024 1027   LDLCALC 100 (H) 01/09/2024 1027   LDLCALC 122 (H) 07/11/2014 0922   LDLCALC 148  (H) 03/19/2013 1034    No BP recorded.  {Refresh Note OR Click here to enter BP  :1}***    ECG personally reviewed by me today- EKG Interpretation Date/Time:    Ventricular Rate:    PR Interval:    QRS Duration:    QT Interval:    QTC Calculation:   R Axis:      Text Interpretation:     EKG 01/06/2023 atrial fibrillation 74 bpm- No acute changes  Echocardiogram 10/12/2018  Study Conclusions   - Left ventricle: The cavity size was normal. Wall thickness was    normal. Systolic function was normal. The estimated ejection    fraction was in the range of 60% to 65%. Wall motion was normal;    there were no regional wall motion abnormalities.  - Aortic valve: Mildly to moderately calcified annulus. Moderately    thickened, moderately calcified leaflets. There was mild    stenosis. Valve area (VTI): 2.34 cm^2. Valve area (Vmax): 2.52    cm^2. Valve area (Vmean): 2.71 cm^2.  - Mitral valve: There was mild regurgitation.  - Left atrium: The atrium was mildly dilated.  - Right ventricle: Systolic function was mildly reduced.  - Right atrium: The atrium was mildly dilated.  - Tricuspid valve: There was moderate regurgitation.  - Pulmonary arteries: Systolic pressure was moderately to severely    increased. PA peak pressure: 60 mm Hg (S).   -------------------------------------------------------------------  Study data:  Comparison was made to the study of 10/22/2016.  Study  status:  Routine.  Procedure:  The patient reported no pain pre or  post test. Transthoracic echocardiography. Image quality was  adequate.  Study completion:  There were no complications.  Transthoracic echocardiography.  M-mode, complete 2D, spectral  Doppler, and color Doppler.  Birthdate:  Patient birthdate:  [birth year].  Age:  Patient is 89 yr old.  Sex:  Gender: female.  BMI: 39.4 kg/m^2.  Blood pressure:     115/61  Patient status:  Inpatient.  Study date:  Study date: 10/12/2018. Study time: 02:45   PM.  Location:  Echo laboratory.   -------------------------------------------------------------------   -------------------------------------------------------------------  Left ventricle:  The cavity size was normal. Wall thickness was  normal. Systolic function was normal. The estimated ejection  fraction was in the range of 60% to 65%. Wall motion was normal;  there were no regional wall motion abnormalities. The study was not  technically sufficient to allow evaluation of LV diastolic  dysfunction due to atrial fibrillation.   -------------------------------------------------------------------  Aortic valve:   Mildly to moderately calcified annulus. Moderately  thickened, moderately calcified leaflets.  Doppler:   There was  mild stenosis.      VTI ratio of LVOT to aortic valve: 0.68. Valve  area (VTI): 2.34 cm^2. Indexed valve area (VTI): 1.05 cm^2/m^2.  Peak velocity ratio of LVOT to aortic valve: 0.73. Valve area  (Vmax): 2.52 cm^2. Indexed valve area (Vmax): 1.13 cm^2/m^2. Mean  velocity ratio of LVOT to aortic valve: 0.78. Valve area (Vmean):  2.71 cm^2. Indexed valve area (Vmean): 1.22 cm^2/m^2.    Mean  gradient (S): 9 mm Hg. Peak gradient (S): 16 mm Hg.   -------------------------------------------------------------------  Aorta: Aortic root: The aortic root was normal in size.  Ascending aorta: The ascending aorta was normal in size.   -------------------------------------------------------------------  Mitral valve:   Structurally normal valve.   Leaflet separation was  normal.  Doppler:  Transvalvular velocity was within the normal  range. There was no evidence for stenosis. There was mild  regurgitation.    Valve area by pressure half-time: 3.44 cm^2.  Indexed valve area by pressure half-time: 1.55 cm^2/m^2.    Peak  gradient (D): 9 mm Hg.   -------------------------------------------------------------------  Left atrium:  The atrium was mildly dilated.    -------------------------------------------------------------------  Right ventricle:  The cavity size was normal. Systolic function was  mildly reduced.   -------------------------------------------------------------------  Pulmonic valve:    The valve appears to be grossly normal.  Doppler:  There was mild to moderate regurgitation.   -------------------------------------------------------------------  Tricuspid valve:   The valve appears to be grossly normal.  Doppler:  There was moderate regurgitation.   -------------------------------------------------------------------  Pulmonary artery:   Systolic pressure was moderately to severely  increased.   -------------------------------------------------------------------  Right atrium:  The atrium was mildly dilated.   -------------------------------------------------------------------  Pericardium: There was no pericardial effusion.    Assessment & Plan   1.  Essential hypertension-BP today 12***8/68 Continue metoprolol  Heart healthy low-sodium diet-reviewed Maintain physical activity Maintain blood pressure log-reviewed  Atrial fibrillation-Heart rate today 7***5 bpm.   Continues to be cardiac unaware.  Rate controlled.  Reports she is compliant with Xarelto  and denies bleeding issues. Continue Xarelto , metoprolol  Avoid triggers caffeine, chocolate, EtOH, dehydration etc.-reviewed  Aortic calcification, mild aortic stenosis-performing baseline physical activity. Repeat echocardiogram when clinically indicated  Hyperlipidemia-LDL 11***7 on 10/19/22.  Previously noted to wish to defer cholesterol reducing medication Maintain high-fiber diet Increase physical activity as tolerated Follows with PCP  Pulmonary hypertension-breathing at baseline.  She continues to live independently.  Somewhat sedentary..  Previously felt to have some diastolic dysfunction in the setting of atrial fibrillation and OHS. Maintain physical  activity as tolerated Continue to monitor   Disposition: Follow-up with Dr. Addie Holstein or me in 6 months.   Chet Cota. Gianpaolo Mindel NP-C     04/16/2024, 4:08 PM [facility] [address] Office [phone] Fax [phone]    I spent 14 ***minutes examining this patient, reviewing medications, and using patient centered shared decision making involving her cardiac care.   I spent 20 minutes reviewing her past medical history,  medications, and prior cardiac tests.

## 2024-04-17 ENCOUNTER — Telehealth: Payer: Self-pay | Admitting: Nurse Practitioner

## 2024-04-17 DIAGNOSIS — I350 Nonrheumatic aortic (valve) stenosis: Secondary | ICD-10-CM

## 2024-04-17 NOTE — Telephone Encounter (Signed)
 I called and spoke with patient. She would like to start seeing Dr Lavonne Prairie in [place] if possible. She says she likes Dr Eduard Grad (current cardiologist) but does not like the drive and its hard for her to find transportation. Can referral/records be sent to Dr Lavonne Prairie?   Copied from CRM [id number]. Topic: Appointments - Scheduling Inquiry for Clinic >> Apr 17, 2024 10:50 AM Stanly Early wrote: Reason for CRM: patient called to change her appointment on may 22nd from [place] to a [place] location. Patient stated she wanted something closer. Patient would like for someone to call her [phone]

## 2024-04-17 NOTE — Telephone Encounter (Signed)
 Can you have him switched to dr. Danley Dusky so she doe snot have to go to [employer]

## 2024-04-18 ENCOUNTER — Telehealth: Payer: Self-pay | Admitting: Cardiology

## 2024-04-18 NOTE — Telephone Encounter (Signed)
 Pt is requesting to switch from Dr. Addie Holstein to Dr. Lavonne Prairie at [place] location due to her living in [place] and needing something closer. Please confirm

## 2024-04-18 NOTE — Telephone Encounter (Signed)
 Finally me

## 2024-04-19 ENCOUNTER — Ambulatory Visit: Attending: General Practice | Admitting: General Practice

## 2024-04-19 ENCOUNTER — Ambulatory Visit: Payer: Self-pay

## 2024-04-19 NOTE — Telephone Encounter (Signed)
 Patient called, left VM to return the call to the office to speak to NT.     Copied From CRM [id number]. Reason for Triage: patients relative is calling, saying patients legs are red and swollen, difficulty walking.  You can reach her for the next 30 min at her home number then after at her cell number. She is not at the patients house right now but she will be heading back over there shortly.

## 2024-04-19 NOTE — Telephone Encounter (Signed)
  Chief Complaint: leg swelling Symptoms: swelling, redness Frequency: ondet 04/18/24 Pertinent Negatives: Patient's daughter denies other symptoms Disposition: [] ED /[] Urgent Care (no appt availability in office) / [x] Appointment(In office/virtual)/ []  [place] [facility]/ [] Home Care/ [] Refused Recommended Disposition /[] [place] [facility]/ []  Follow-up with PCP Additional Notes:  Daughter Sherri calling requesting appointment today for recurring leg edema, states frustrated that her cousins have been calling all day but nobody would schedule, note this writer unable to find additional call documentation. Clide Dalton is complaining of bilateral leg pain, red and inflamed. Swelling and pain started last night. She has experienced this in the past that required medication and compression. Inquired about shortness of breath and chest pain but Catherin Closs is not with patient at this time, she had not complained of any other symptoms when she was with patient earlier. No appointments available today, advised they could seek evaluation at urgent care today or offered acute visit in office on 04/20/24, accepted acute visit in office on 04/20/24, scheduled with an alternate provider. Educated on care advice as documented in protocol, patient verbalized understanding. Discussed reasons to call back or call for EMS.       Reason for Disposition  [1] MODERATE leg swelling (e.g., swelling extends up to knees) AND [2] new-onset or worsening  Protocols used: Leg Swelling and Edema-A-AH

## 2024-04-19 NOTE — Telephone Encounter (Signed)
 Appt scheduled

## 2024-04-20 ENCOUNTER — Ambulatory Visit: Admitting: Family

## 2024-04-20 ENCOUNTER — Encounter: Payer: Self-pay | Admitting: Family

## 2024-04-20 VITALS — BP 104/59 | HR 96 | Temp 98.2°F | Ht 64.0 in | Wt 195.0 lb

## 2024-04-20 DIAGNOSIS — E669 Obesity, unspecified: Secondary | ICD-10-CM | POA: Diagnosis not present

## 2024-04-20 DIAGNOSIS — R600 Localized edema: Secondary | ICD-10-CM | POA: Diagnosis not present

## 2024-04-20 DIAGNOSIS — L03115 Cellulitis of right lower limb: Secondary | ICD-10-CM

## 2024-04-20 MED ORDER — DOXYCYCLINE HYCLATE 100 MG PO TABS
100.0000 mg | ORAL_TABLET | Freq: Two times a day (BID) | ORAL | 0 refills | Status: DC
Start: 2024-04-20 — End: 2024-07-10

## 2024-04-20 MED ORDER — FUROSEMIDE 40 MG PO TABS: ORAL_TABLET | ORAL | 0 refills | Status: DC

## 2024-04-20 NOTE — Patient Instructions (Signed)
 Peripheral Edema  Peripheral edema is swelling that is caused by a buildup of fluid. Peripheral edema most often affects the lower legs, ankles, and feet. It can also develop in the arms, hands, and face. The area of the body that has peripheral edema will look swollen. It may also feel heavy or warm. Your clothes may start to feel tight. Pressing on the area may make a temporary dent in your skin (pitting edema). You may not be able to move your swollen arm or leg as much as usual. There are many causes of peripheral edema. It can happen because of a complication of other conditions such as heart failure, kidney disease, or a problem with your circulation. It also can be a side effect of certain medicines or happen because of an infection. It often happens to women during pregnancy. Sometimes, the cause is not known. Follow these instructions at home: Managing pain, stiffness, and swelling  Raise (elevate) your legs while you are sitting or lying down. Move around often to prevent stiffness and to reduce swelling. Do not sit or stand for long periods of time. Do not wear tight clothing. Do not wear garters on your upper legs. Exercise your legs to get your circulation going. This helps to move the fluid back into your blood vessels, and it may help the swelling go down. Wear compression stockings as told by your health care provider. These stockings help to prevent blood clots and reduce swelling in your legs. It is important that these are the correct size. These stockings should be prescribed by your doctor to prevent possible injuries. If elastic bandages or wraps are recommended, use them as told by your health care provider. Medicines Take over-the-counter and prescription medicines only as told by your health care provider. Your health care provider may prescribe medicine to help your body get rid of excess water (diuretic). Take this medicine if you are told to take it. General  instructions Eat a low-salt (low-sodium) diet as told by your health care provider. Sometimes, eating less salt may reduce swelling. Pay attention to any changes in your symptoms. Moisturize your skin daily to help prevent skin from cracking and draining. Keep all follow-up visits. This is important. Contact a health care provider if: You have a fever. You have swelling in only one leg. You have increased swelling, redness, or pain in one or both of your legs. You have drainage or sores at the area where you have edema. Get help right away if: You have edema that starts suddenly or is getting worse, especially if you are pregnant or have a medical condition. You develop shortness of breath, especially when you are lying down. You have pain in your chest or abdomen. You feel weak. You feel like you will faint. These symptoms may be an emergency. Get help right away. Call 911. Do not wait to see if the symptoms will go away. Do not drive yourself to the hospital. Summary Peripheral edema is swelling that is caused by a buildup of fluid. Peripheral edema most often affects the lower legs, ankles, and feet. Move around often to prevent stiffness and to reduce swelling. Do not sit or stand for long periods of time. Pay attention to any changes in your symptoms. Contact a health care provider if you have edema that starts suddenly or is getting worse, especially if you are pregnant or have a medical condition. Get help right away if you develop shortness of breath, especially when lying down.  This information is not intended to replace advice given to you by your health care provider. Make sure you discuss any questions you have with your health care provider. Document Revised: 07/20/2021 Document Reviewed: 07/20/2021 Elsevier Patient Education  2024 [employer].

## 2024-04-20 NOTE — Progress Notes (Addendum)
 Subjective:    Patient ID: Debra Campbell, female    DOB: 1932-02-01, 89 y.o.   MRN: [id number]  Chief Complaint  Patient presents with   Edema    Legs and arms. Both legs are weeping and broken skin on both arms.   PT presents to the office today with recurrent peripheral edema and erythemas. Reports this has been on going on and off for two years.   She was taking her Lasix  40 mg BID, but increased to 80 mg BID for 3 days. Her weeping edema has improved slightly.      04/20/2024    1:53 PM 01/09/2024   10:34 AM 12/02/2023    8:54 AM  Last 3 Weights  Weight (lbs) 195 lb 195 lb 193 lb  Weight (kg) 88.451 kg 88.451 kg 87.544 kg     HPI    Review of Systems  All other systems reviewed and are negative.   Social History   Socioeconomic History   Marital status: Married    Spouse name: Not on file   Number of children: 3   Years of education: Not on file   Highest education level: High school graduate  Occupational History   Occupation: Retired    Comment: [employer]  Tobacco Use   Smoking status: Never   Smokeless tobacco: Never  Vaping Use   Vaping status: Never Used  Substance and Sexual Activity   Alcohol use: No   Drug use: No   Sexual activity: Not Currently    Birth control/protection: Post-menopausal  Other Topics Concern   Not on file  Social History Narrative   Not on file   Social Drivers of Health   Financial Resource Strain: Low Risk  (12/02/2023)   Overall Financial Resource Strain (CARDIA)    Difficulty of Paying Living Expenses: Not hard at all  Food Insecurity: No Food Insecurity (12/02/2023)   Hunger Vital Sign    Worried About Running Out of Food in the Last Year: Never true    Ran Out of Food in the Last Year: Never true  Transportation Needs: No Transportation Needs (12/02/2023)   PRAPARE - Administrator, Civil Service (Medical): No    Lack of Transportation (Non-Medical): No  Physical Activity: Inactive (12/02/2023)   Exercise  Vital Sign    Days of Exercise per Week: 0 days    Minutes of Exercise per Session: 0 min  Stress: No Stress Concern Present (12/02/2023)   [employer] of Occupational Health - Occupational Stress Questionnaire    Feeling of Stress : Not at all  Social Connections: Moderately Integrated (12/02/2023)   Social Connection and Isolation Panel [NHANES]    Frequency of Communication with Friends and Family: More than three times a week    Frequency of Social Gatherings with Friends and Family: Three times a week    Attends Religious Services: More than 4 times per year    Active Member of Clubs or Organizations: No    Attends Banker Meetings: Never    Marital Status: Married   Family History  Problem Relation Age of Onset   Heart disease Father    Thyroid  disease Father    Heart attack Father    Heart disease Sister        open heart surgery   Cancer Sister        breast   Neuropathy Sister        secondary to cancer treatment   Breast  cancer Sister    Suicidality Brother 48   Arthritis Mother    Cancer Daughter    Breast cancer Daughter         Objective:   Physical Exam Vitals reviewed.  Constitutional:      General: She is not in acute distress.    Appearance: She is well-developed.  HENT:     Head: Normocephalic and atraumatic.  Eyes:     Pupils: Pupils are equal, round, and reactive to light.  Neck:     Thyroid : No thyromegaly.  Cardiovascular:     Rate and Rhythm: Normal rate and regular rhythm.     Heart sounds: Murmur heard.  Pulmonary:     Effort: Pulmonary effort is normal. No respiratory distress.     Breath sounds: Normal breath sounds. No wheezing.  Abdominal:     General: Bowel sounds are normal. There is no distension.     Palpations: Abdomen is soft.     Tenderness: There is no abdominal tenderness.  Musculoskeletal:        General: No tenderness.     Cervical back: Normal range of motion and neck supple.     Right lower leg: Edema  (3+, weeping, erythemas) present.     Left lower leg: Edema (2+) present.     Comments: Weeping edema bilaterally, worse in right leg. Erythemas and tender  Skin:    General: Skin is warm and dry.  Neurological:     Mental Status: She is alert and oriented to person, place, and time.     Cranial Nerves: No cranial nerve deficit.     Motor: Weakness present.     Gait: Gait abnormal (in wheelchair).     Deep Tendon Reflexes: Reflexes are normal and symmetric.  Psychiatric:        Behavior: Behavior normal.        Thought Content: Thought content normal.        Judgment: Judgment normal.    Unna boots applied bilaterally.    BP (!) 104/59   Pulse 96   Temp 98.2 F (36.8 C)   Ht 5\' 4"  (1.626 m)   Wt 195 lb (88.5 kg)   SpO2 94%   BMI 33.47 kg/m      Assessment & Plan:  Debra Campbell comes in today with chief complaint of Edema (Legs and arms. Both legs are weeping and broken skin on both arms.)   Diagnosis and orders addressed:  1. Peripheral edema (Primary) - furosemide  (LASIX ) 40 MG tablet; 80 mg in AM and 40 mg in pm  Dispense: 10 tablet; Refill: 0 - BMP8+EGFR  2. Morbid obesity (HCC) - BMP8+EGFR  3. Cellulitis of right lower extremity  - doxycycline  (VIBRA -TABS) 100 MG tablet; Take 1 tablet (100 mg total) by mouth 2 (two) times daily.  Dispense: 20 tablet; Refill: 0 - furosemide  (LASIX ) 40 MG tablet; 80 mg in AM and 40 mg in pm  Dispense: 10 tablet; Refill: 0 - BMP8+EGFR   Labs pending Start doxycycline   Will increase lasix  to 80 mg in AM and 40 mg in pm for next 3 days Unna boots applied bilaterally  Continue current medications  Call if symptoms worsen or do not improve   Return in about 4 days (around 04/24/2024), or if symptoms worsen or fail to improve.    Tommas Fragmin, FNP

## 2024-04-21 LAB — BMP8+EGFR
BUN/Creatinine Ratio: 21 (ref 12–28)
BUN: 24 mg/dL (ref 8–27)
CO2: 21 mmol/L (ref 20–29)
Calcium: 9.4 mg/dL (ref 8.7–10.3)
Chloride: 100 mmol/L (ref 96–106)
Creatinine, Ser: 1.16 mg/dL — ABNORMAL HIGH (ref 0.57–1.00)
Glucose: 182 mg/dL — ABNORMAL HIGH (ref 70–99)
Potassium: 4.3 mmol/L (ref 3.5–5.2)
Sodium: 139 mmol/L (ref 134–144)
eGFR: 45 mL/min/{1.73_m2} — ABNORMAL LOW (ref >59)

## 2024-04-24 ENCOUNTER — Encounter: Payer: Self-pay | Admitting: Family

## 2024-04-24 ENCOUNTER — Ambulatory Visit (INDEPENDENT_AMBULATORY_CARE_PROVIDER_SITE_OTHER): Admitting: Family

## 2024-04-24 ENCOUNTER — Ambulatory Visit: Payer: Self-pay | Admitting: Family

## 2024-04-24 VITALS — BP 117/79 | HR 63 | Temp 97.0°F | Ht 64.0 in | Wt 196.0 lb

## 2024-04-24 DIAGNOSIS — R600 Localized edema: Secondary | ICD-10-CM | POA: Diagnosis not present

## 2024-04-24 DIAGNOSIS — N1831 Chronic kidney disease, stage 3a: Secondary | ICD-10-CM

## 2024-04-24 DIAGNOSIS — L03115 Cellulitis of right lower limb: Secondary | ICD-10-CM

## 2024-04-24 NOTE — Patient Instructions (Signed)
 Peripheral Edema  Peripheral edema is swelling that is caused by a buildup of fluid. Peripheral edema most often affects the lower legs, ankles, and feet. It can also develop in the arms, hands, and face. The area of the body that has peripheral edema will look swollen. It may also feel heavy or warm. Your clothes may start to feel tight. Pressing on the area may make a temporary dent in your skin (pitting edema). You may not be able to move your swollen arm or leg as much as usual. There are many causes of peripheral edema. It can happen because of a complication of other conditions such as heart failure, kidney disease, or a problem with your circulation. It also can be a side effect of certain medicines or happen because of an infection. It often happens to women during pregnancy. Sometimes, the cause is not known. Follow these instructions at home: Managing pain, stiffness, and swelling  Raise (elevate) your legs while you are sitting or lying down. Move around often to prevent stiffness and to reduce swelling. Do not sit or stand for long periods of time. Do not wear tight clothing. Do not wear garters on your upper legs. Exercise your legs to get your circulation going. This helps to move the fluid back into your blood vessels, and it may help the swelling go down. Wear compression stockings as told by your health care provider. These stockings help to prevent blood clots and reduce swelling in your legs. It is important that these are the correct size. These stockings should be prescribed by your doctor to prevent possible injuries. If elastic bandages or wraps are recommended, use them as told by your health care provider. Medicines Take over-the-counter and prescription medicines only as told by your health care provider. Your health care provider may prescribe medicine to help your body get rid of excess water (diuretic). Take this medicine if you are told to take it. General  instructions Eat a low-salt (low-sodium) diet as told by your health care provider. Sometimes, eating less salt may reduce swelling. Pay attention to any changes in your symptoms. Moisturize your skin daily to help prevent skin from cracking and draining. Keep all follow-up visits. This is important. Contact a health care provider if: You have a fever. You have swelling in only one leg. You have increased swelling, redness, or pain in one or both of your legs. You have drainage or sores at the area where you have edema. Get help right away if: You have edema that starts suddenly or is getting worse, especially if you are pregnant or have a medical condition. You develop shortness of breath, especially when you are lying down. You have pain in your chest or abdomen. You feel weak. You feel like you will faint. These symptoms may be an emergency. Get help right away. Call 911. Do not wait to see if the symptoms will go away. Do not drive yourself to the hospital. Summary Peripheral edema is swelling that is caused by a buildup of fluid. Peripheral edema most often affects the lower legs, ankles, and feet. Move around often to prevent stiffness and to reduce swelling. Do not sit or stand for long periods of time. Pay attention to any changes in your symptoms. Contact a health care provider if you have edema that starts suddenly or is getting worse, especially if you are pregnant or have a medical condition. Get help right away if you develop shortness of breath, especially when lying down.  This information is not intended to replace advice given to you by your health care provider. Make sure you discuss any questions you have with your health care provider. Document Revised: 07/20/2021 Document Reviewed: 07/20/2021 Elsevier Patient Education  2024 [employer].

## 2024-04-24 NOTE — Progress Notes (Signed)
 Subjective:    Patient ID: Debra Campbell, female    DOB: 04-11-1932, 89 y.o.   MRN: [id number]  Chief Complaint  Patient presents with   Edema    HPI Pt presents to the office today to recheck edema and cellulitis. She was seen on 04/20/24 and applied unna boots and we increased her Lasix  to 80 mg AM and 40 mg in evening. Has been taking doxycyline 100 mg BID for 10 days.      04/24/2024    8:53 AM 04/20/2024    1:53 PM 01/09/2024   10:34 AM  Last 3 Weights  Weight (lbs) 196 lb 195 lb 195 lb  Weight (kg) 88.905 kg 88.451 kg 88.451 kg      Review of Systems  All other systems reviewed and are negative.   Social History   Socioeconomic History   Marital status: Married    Spouse name: Not on file   Number of children: 3   Years of education: Not on file   Highest education level: High school graduate  Occupational History   Occupation: Retired    Comment: [employer]  Tobacco Use   Smoking status: Never   Smokeless tobacco: Never  Vaping Use   Vaping status: Never Used  Substance and Sexual Activity   Alcohol use: No   Drug use: No   Sexual activity: Not Currently    Birth control/protection: Post-menopausal  Other Topics Concern   Not on file  Social History Narrative   Not on file   Social Drivers of Health   Financial Resource Strain: Low Risk  (12/02/2023)   Overall Financial Resource Strain (CARDIA)    Difficulty of Paying Living Expenses: Not hard at all  Food Insecurity: No Food Insecurity (12/02/2023)   Hunger Vital Sign    Worried About Running Out of Food in the Last Year: Never true    Ran Out of Food in the Last Year: Never true  Transportation Needs: No Transportation Needs (12/02/2023)   PRAPARE - Administrator, Civil Service (Medical): No    Lack of Transportation (Non-Medical): No  Physical Activity: Inactive (12/02/2023)   Exercise Vital Sign    Days of Exercise per Week: 0 days    Minutes of Exercise per Session: 0 min  Stress: No  Stress Concern Present (12/02/2023)   [employer] of Occupational Health - Occupational Stress Questionnaire    Feeling of Stress : Not at all  Social Connections: Moderately Integrated (12/02/2023)   Social Connection and Isolation Panel [NHANES]    Frequency of Communication with Friends and Family: More than three times a week    Frequency of Social Gatherings with Friends and Family: Three times a week    Attends Religious Services: More than 4 times per year    Active Member of Clubs or Organizations: No    Attends Banker Meetings: Never    Marital Status: Married   Family History  Problem Relation Age of Onset   Heart disease Father    Thyroid  disease Father    Heart attack Father    Heart disease Sister        open heart surgery   Cancer Sister        breast   Neuropathy Sister        secondary to cancer treatment   Breast cancer Sister    Suicidality Brother 8   Arthritis Mother    Cancer Daughter    Breast cancer  Daughter         Objective:   Physical Exam Vitals reviewed.  Constitutional:      General: She is not in acute distress.    Appearance: She is well-developed. She is obese.  HENT:     Head: Normocephalic and atraumatic.  Eyes:     Pupils: Pupils are equal, round, and reactive to light.  Neck:     Thyroid : No thyromegaly.  Cardiovascular:     Rate and Rhythm: Normal rate and regular rhythm.     Heart sounds: Murmur heard.  Pulmonary:     Effort: Pulmonary effort is normal. No respiratory distress.     Breath sounds: Normal breath sounds. No wheezing.  Abdominal:     General: Bowel sounds are normal. There is no distension.     Palpations: Abdomen is soft.     Tenderness: There is no abdominal tenderness.  Musculoskeletal:        General: No tenderness. Normal range of motion.     Cervical back: Normal range of motion and neck supple.     Right lower leg: Edema (2+) present.  Skin:    General: Skin is warm and dry.      Findings: Erythema present.     Comments: Weeping edema improved, scabbed area on legs  Neurological:     Mental Status: She is alert and oriented to person, place, and time.     Cranial Nerves: No cranial nerve deficit.     Deep Tendon Reflexes: Reflexes are normal and symmetric.  Psychiatric:        Behavior: Behavior normal.        Thought Content: Thought content normal.        Judgment: Judgment normal.     Unna boots applied bilaterally.   BP 117/79   Pulse 63   Temp (!) 97 F (36.1 C) (Temporal)   Ht 5\' 4"  (1.626 m)   Wt 196 lb (88.9 kg) Comment: wheelchair  SpO2 95%   BMI 33.64 kg/m      Assessment & Plan:  Debra Campbell comes in today with chief complaint of Edema   Diagnosis and orders addressed:  1. Peripheral edema (Primary)  2. Cellulitis of right lower extremity  3. Stage 3a chronic kidney disease (HCC) Avoid NSAID"S   Continue Lasix  40 mg BID Continue doxycyline Legs greatly improved!  Unna boots applied  Encouraged her to weigh daily Follow up in 3 days for change in unna boots. May need to recheck BMP at that time.    Tommas Fragmin, FNP

## 2024-04-26 ENCOUNTER — Ambulatory Visit: Admitting: Family

## 2024-04-26 ENCOUNTER — Encounter: Payer: Self-pay | Admitting: Family

## 2024-04-26 VITALS — BP 143/73 | HR 76 | Temp 97.3°F | Resp 18 | Ht 64.0 in | Wt 195.8 lb

## 2024-04-26 DIAGNOSIS — R600 Localized edema: Secondary | ICD-10-CM | POA: Diagnosis not present

## 2024-04-26 DIAGNOSIS — L03115 Cellulitis of right lower limb: Secondary | ICD-10-CM | POA: Diagnosis not present

## 2024-04-26 NOTE — Progress Notes (Signed)
 Subjective:    Patient ID: Debra Campbell, female    DOB: [birth year], 89 y.o.   MRN: [id number]  No chief complaint on file.   HPI Pt presents to the office today to recheck edema and cellulitis. She was seen on 04/20/24 and 04/24/24 and applied unna boots. We increased her Lasix  to 80 mg AM and 40 mg in evening. Has been taking doxycyline 100 mg BID for 10 days.  Denies any fever or pain.      04/24/2024    8:53 AM 04/20/2024    1:53 PM 01/09/2024   10:34 AM  Last 3 Weights  Weight (lbs) 196 lb 195 lb 195 lb  Weight (kg) 88.905 kg 88.451 kg 88.451 kg      Review of Systems  All other systems reviewed and are negative.   Social History   Socioeconomic History   Marital status: Married    Spouse name: Not on file   Number of children: 3   Years of education: Not on file   Highest education level: High school graduate  Occupational History   Occupation: Retired    Comment: [employer]  Tobacco Use   Smoking status: Never   Smokeless tobacco: Never  Vaping Use   Vaping status: Never Used  Substance and Sexual Activity   Alcohol use: No   Drug use: No   Sexual activity: Not Currently    Birth control/protection: Post-menopausal  Other Topics Concern   Not on file  Social History Narrative   Not on file   Social Drivers of Health   Financial Resource Strain: Low Risk  (12/02/2023)   Overall Financial Resource Strain (CARDIA)    Difficulty of Paying Living Expenses: Not hard at all  Food Insecurity: No Food Insecurity (12/02/2023)   Hunger Vital Sign    Worried About Running Out of Food in the Last Year: Never true    Ran Out of Food in the Last Year: Never true  Transportation Needs: No Transportation Needs (12/02/2023)   PRAPARE - Administrator, Civil Service (Medical): No    Lack of Transportation (Non-Medical): No  Physical Activity: Inactive (12/02/2023)   Exercise Vital Sign    Days of Exercise per Week: 0 days    Minutes of Exercise per Session: 0 min   Stress: No Stress Concern Present (12/02/2023)   [employer] of Occupational Health - Occupational Stress Questionnaire    Feeling of Stress : Not at all  Social Connections: Moderately Integrated (12/02/2023)   Social Connection and Isolation Panel [NHANES]    Frequency of Communication with Friends and Family: More than three times a week    Frequency of Social Gatherings with Friends and Family: Three times a week    Attends Religious Services: More than 4 times per year    Active Member of Clubs or Organizations: No    Attends Banker Meetings: Never    Marital Status: Married   Family History  Problem Relation Age of Onset   Heart disease Father    Thyroid  disease Father    Heart attack Father    Heart disease Sister        open heart surgery   Cancer Sister        breast   Neuropathy Sister        secondary to cancer treatment   Breast cancer Sister    Suicidality Brother 60   Arthritis Mother    Cancer Daughter  Breast cancer Daughter         Objective:   Physical Exam Vitals reviewed.  Constitutional:      General: She is not in acute distress.    Appearance: She is well-developed. She is obese.  HENT:     Head: Normocephalic and atraumatic.  Eyes:     Pupils: Pupils are equal, round, and reactive to light.  Neck:     Thyroid : No thyromegaly.  Cardiovascular:     Rate and Rhythm: Normal rate and regular rhythm.     Heart sounds: Murmur heard.  Pulmonary:     Effort: Pulmonary effort is normal. No respiratory distress.     Breath sounds: Normal breath sounds. No wheezing.  Abdominal:     General: Bowel sounds are normal. There is no distension.     Palpations: Abdomen is soft.     Tenderness: There is no abdominal tenderness.  Musculoskeletal:        General: No tenderness. Normal range of motion.     Cervical back: Normal range of motion and neck supple.     Right lower leg: Edema (2+) present.  Skin:    General: Skin is warm and  dry.     Findings: Erythema present.     Comments: No weeping edema, scabbed area on legs  Neurological:     Mental Status: She is alert and oriented to person, place, and time.     Cranial Nerves: No cranial nerve deficit.     Deep Tendon Reflexes: Reflexes are normal and symmetric.  Psychiatric:        Behavior: Behavior normal.        Thought Content: Thought content normal.        Judgment: Judgment normal.      Unna boots applied bilaterally.   There were no vitals taken for this visit.     Assessment & Plan:  [name] comes in today with chief complaint of No chief complaint on file.   Diagnosis and orders addressed:   1. Morbid obesity (HCC) - Brain natriuretic peptide - BMP8+EGFR  2. Peripheral edema (Primary) - Brain natriuretic peptide - BMP8+EGFR  3. Cellulitis of right lower extremity - Brain natriuretic peptide - BMP8+EGFR   Continue Lasix  40 mg BID Continue doxycyline Legs greatly improved!  Unna boots applied  Encouraged her to weigh daily Follow up in 3 days for change in unna boots. May need to recheck BMP at that time.    Tommas Fragmin, FNP

## 2024-04-26 NOTE — Patient Instructions (Signed)
 Peripheral Edema  Peripheral edema is swelling that is caused by a buildup of fluid. Peripheral edema most often affects the lower legs, ankles, and feet. It can also develop in the arms, hands, and face. The area of the body that has peripheral edema will look swollen. It may also feel heavy or warm. Your clothes may start to feel tight. Pressing on the area may make a temporary dent in your skin (pitting edema). You may not be able to move your swollen arm or leg as much as usual. There are many causes of peripheral edema. It can happen because of a complication of other conditions such as heart failure, kidney disease, or a problem with your circulation. It also can be a side effect of certain medicines or happen because of an infection. It often happens to women during pregnancy. Sometimes, the cause is not known. Follow these instructions at home: Managing pain, stiffness, and swelling  Raise (elevate) your legs while you are sitting or lying down. Move around often to prevent stiffness and to reduce swelling. Do not sit or stand for long periods of time. Do not wear tight clothing. Do not wear garters on your upper legs. Exercise your legs to get your circulation going. This helps to move the fluid back into your blood vessels, and it may help the swelling go down. Wear compression stockings as told by your health care provider. These stockings help to prevent blood clots and reduce swelling in your legs. It is important that these are the correct size. These stockings should be prescribed by your doctor to prevent possible injuries. If elastic bandages or wraps are recommended, use them as told by your health care provider. Medicines Take over-the-counter and prescription medicines only as told by your health care provider. Your health care provider may prescribe medicine to help your body get rid of excess water (diuretic). Take this medicine if you are told to take it. General  instructions Eat a low-salt (low-sodium) diet as told by your health care provider. Sometimes, eating less salt may reduce swelling. Pay attention to any changes in your symptoms. Moisturize your skin daily to help prevent skin from cracking and draining. Keep all follow-up visits. This is important. Contact a health care provider if: You have a fever. You have swelling in only one leg. You have increased swelling, redness, or pain in one or both of your legs. You have drainage or sores at the area where you have edema. Get help right away if: You have edema that starts suddenly or is getting worse, especially if you are pregnant or have a medical condition. You develop shortness of breath, especially when you are lying down. You have pain in your chest or abdomen. You feel weak. You feel like you will faint. These symptoms may be an emergency. Get help right away. Call 911. Do not wait to see if the symptoms will go away. Do not drive yourself to the hospital. Summary Peripheral edema is swelling that is caused by a buildup of fluid. Peripheral edema most often affects the lower legs, ankles, and feet. Move around often to prevent stiffness and to reduce swelling. Do not sit or stand for long periods of time. Pay attention to any changes in your symptoms. Contact a health care provider if you have edema that starts suddenly or is getting worse, especially if you are pregnant or have a medical condition. Get help right away if you develop shortness of breath, especially when lying down.  This information is not intended to replace advice given to you by your health care provider. Make sure you discuss any questions you have with your health care provider. Document Revised: 07/20/2021 Document Reviewed: 07/20/2021 Elsevier Patient Education  2024 [employer].

## 2024-04-27 ENCOUNTER — Ambulatory Visit: Payer: Self-pay | Admitting: Family

## 2024-04-27 LAB — BMP8+EGFR
BUN/Creatinine Ratio: 13 (ref 12–28)
BUN: 15 mg/dL (ref 8–27)
CO2: 23 mmol/L (ref 20–29)
Calcium: 9.5 mg/dL (ref 8.7–10.3)
Chloride: 99 mmol/L (ref 96–106)
Creatinine, Ser: 1.16 mg/dL — ABNORMAL HIGH (ref 0.57–1.00)
Glucose: 148 mg/dL — ABNORMAL HIGH (ref 70–99)
Potassium: 4.2 mmol/L (ref 3.5–5.2)
Sodium: 140 mmol/L (ref 134–144)
eGFR: 45 mL/min/{1.73_m2} — ABNORMAL LOW (ref >59)

## 2024-04-27 LAB — BRAIN NATRIURETIC PEPTIDE: BNP: 151.3 pg/mL — ABNORMAL HIGH (ref 0.0–100.0)

## 2024-04-27 NOTE — Progress Notes (Signed)
 Done

## 2024-04-30 ENCOUNTER — Ambulatory Visit: Admitting: Family

## 2024-04-30 ENCOUNTER — Encounter: Payer: Self-pay | Admitting: Family

## 2024-04-30 VITALS — BP 124/75 | HR 89 | Temp 97.5°F | Ht 64.0 in | Wt 195.4 lb

## 2024-04-30 DIAGNOSIS — R600 Localized edema: Secondary | ICD-10-CM

## 2024-04-30 DIAGNOSIS — I509 Heart failure, unspecified: Secondary | ICD-10-CM

## 2024-04-30 DIAGNOSIS — I739 Peripheral vascular disease, unspecified: Secondary | ICD-10-CM | POA: Diagnosis not present

## 2024-04-30 NOTE — Progress Notes (Signed)
 Subjective:    Patient ID: Debra Campbell, female    DOB: 02-24-1932, 89 y.o.   MRN: [id number]  Chief Complaint  Patient presents with   Follow-up    HPI Pt presents to the office today to recheck edema and cellulitis. She was seen on 04/20/24, 04/24/24, and 04/26/24 and applied unna boots. We increased her Lasix  to 80 mg AM and 40 mg in evening. Has been taking doxycyline 100 mg BID for 10 days.  Denies any fever or pain.      04/30/2024    3:35 PM 04/26/2024    4:19 PM 04/24/2024    8:53 AM  Last 3 Weights  Weight (lbs) 195 lb 6.4 oz 195 lb 12.8 oz 196 lb  Weight (kg) 88.633 kg 88.814 kg 88.905 kg      Review of Systems  All other systems reviewed and are negative.   Social History   Socioeconomic History   Marital status: Married    Spouse name: Not on file   Number of children: 3   Years of education: Not on file   Highest education level: High school graduate  Occupational History   Occupation: Retired    Comment: [employer]  Tobacco Use   Smoking status: Never   Smokeless tobacco: Never  Vaping Use   Vaping status: Never Used  Substance and Sexual Activity   Alcohol use: No   Drug use: No   Sexual activity: Not Currently    Birth control/protection: Post-menopausal  Other Topics Concern   Not on file  Social History Narrative   Not on file   Social Drivers of Health   Financial Resource Strain: Low Risk  (12/02/2023)   Overall Financial Resource Strain (CARDIA)    Difficulty of Paying Living Expenses: Not hard at all  Food Insecurity: No Food Insecurity (12/02/2023)   Hunger Vital Sign    Worried About Running Out of Food in the Last Year: Never true    Ran Out of Food in the Last Year: Never true  Transportation Needs: No Transportation Needs (12/02/2023)   PRAPARE - Administrator, Civil Service (Medical): No    Lack of Transportation (Non-Medical): No  Physical Activity: Inactive (12/02/2023)   Exercise Vital Sign    Days of Exercise per Week:  0 days    Minutes of Exercise per Session: 0 min  Stress: No Stress Concern Present (12/02/2023)   [employer] of Occupational Health - Occupational Stress Questionnaire    Feeling of Stress : Not at all  Social Connections: Moderately Integrated (12/02/2023)   Social Connection and Isolation Panel [NHANES]    Frequency of Communication with Friends and Family: More than three times a week    Frequency of Social Gatherings with Friends and Family: Three times a week    Attends Religious Services: More than 4 times per year    Active Member of Clubs or Organizations: No    Attends Banker Meetings: Never    Marital Status: Married   Family History  Problem Relation Age of Onset   Heart disease Father    Thyroid  disease Father    Heart attack Father    Heart disease Sister        open heart surgery   Cancer Sister        breast   Neuropathy Sister        secondary to cancer treatment   Breast cancer Sister    Suicidality Brother 71  Arthritis Mother    Cancer Daughter    Breast cancer Daughter         Objective:   Physical Exam Vitals reviewed.  Constitutional:      General: She is not in acute distress.    Appearance: She is well-developed. She is obese.  HENT:     Head: Normocephalic and atraumatic.  Eyes:     Pupils: Pupils are equal, round, and reactive to light.  Neck:     Thyroid : No thyromegaly.  Cardiovascular:     Rate and Rhythm: Normal rate and regular rhythm.     Heart sounds: Murmur heard.  Pulmonary:     Effort: Pulmonary effort is normal. No respiratory distress.     Breath sounds: Normal breath sounds. No wheezing.  Abdominal:     General: Bowel sounds are normal. There is no distension.     Palpations: Abdomen is soft.     Tenderness: There is no abdominal tenderness.  Musculoskeletal:        General: No tenderness. Normal range of motion.     Cervical back: Normal range of motion and neck supple.     Right lower leg: Edema  (2+) present.  Skin:    General: Skin is warm and dry.     Findings: No erythema.     Comments: No weeping edema or warmth, scabbed area on legs  Neurological:     Mental Status: She is alert and oriented to person, place, and time.     Cranial Nerves: No cranial nerve deficit.     Deep Tendon Reflexes: Reflexes are normal and symmetric.  Psychiatric:        Behavior: Behavior normal.        Thought Content: Thought content normal.        Judgment: Judgment normal.      Unna boots applied bilaterally.   BP 124/75   Pulse 89   Temp (!) 97.5 F (36.4 C) (Temporal)   Ht 5\' 4"  (1.626 m)   Wt 195 lb 6.4 oz (88.6 kg)   BMI 33.54 kg/m      Assessment & Plan:  Debra Campbell comes in today with chief complaint of Follow-up   Diagnosis and orders addressed:   1. Peripheral edema (Primary)  2. Chronic congestive heart failure, unspecified heart failure type (HCC)  3. Peripheral vascular disease (HCC    Continue Lasix  40 mg BID Continue doxycyline Legs greatly improved!  Unna boots applied and will remove Friday at home. Start wearing compression hose.  Encouraged her to weigh daily Elevate feet  Low salt diet  Follow if symptoms worsen or do not improve. Keep follow up with PCP   Tommas Fragmin, FNP

## 2024-04-30 NOTE — Patient Instructions (Signed)
 Peripheral Edema  Peripheral edema is swelling that is caused by a buildup of fluid. Peripheral edema most often affects the lower legs, ankles, and feet. It can also develop in the arms, hands, and face. The area of the body that has peripheral edema will look swollen. It may also feel heavy or warm. Your clothes may start to feel tight. Pressing on the area may make a temporary dent in your skin (pitting edema). You may not be able to move your swollen arm or leg as much as usual. There are many causes of peripheral edema. It can happen because of a complication of other conditions such as heart failure, kidney disease, or a problem with your circulation. It also can be a side effect of certain medicines or happen because of an infection. It often happens to women during pregnancy. Sometimes, the cause is not known. Follow these instructions at home: Managing pain, stiffness, and swelling  Raise (elevate) your legs while you are sitting or lying down. Move around often to prevent stiffness and to reduce swelling. Do not sit or stand for long periods of time. Do not wear tight clothing. Do not wear garters on your upper legs. Exercise your legs to get your circulation going. This helps to move the fluid back into your blood vessels, and it may help the swelling go down. Wear compression stockings as told by your health care provider. These stockings help to prevent blood clots and reduce swelling in your legs. It is important that these are the correct size. These stockings should be prescribed by your doctor to prevent possible injuries. If elastic bandages or wraps are recommended, use them as told by your health care provider. Medicines Take over-the-counter and prescription medicines only as told by your health care provider. Your health care provider may prescribe medicine to help your body get rid of excess water (diuretic). Take this medicine if you are told to take it. General  instructions Eat a low-salt (low-sodium) diet as told by your health care provider. Sometimes, eating less salt may reduce swelling. Pay attention to any changes in your symptoms. Moisturize your skin daily to help prevent skin from cracking and draining. Keep all follow-up visits. This is important. Contact a health care provider if: You have a fever. You have swelling in only one leg. You have increased swelling, redness, or pain in one or both of your legs. You have drainage or sores at the area where you have edema. Get help right away if: You have edema that starts suddenly or is getting worse, especially if you are pregnant or have a medical condition. You develop shortness of breath, especially when you are lying down. You have pain in your chest or abdomen. You feel weak. You feel like you will faint. These symptoms may be an emergency. Get help right away. Call 911. Do not wait to see if the symptoms will go away. Do not drive yourself to the hospital. Summary Peripheral edema is swelling that is caused by a buildup of fluid. Peripheral edema most often affects the lower legs, ankles, and feet. Move around often to prevent stiffness and to reduce swelling. Do not sit or stand for long periods of time. Pay attention to any changes in your symptoms. Contact a health care provider if you have edema that starts suddenly or is getting worse, especially if you are pregnant or have a medical condition. Get help right away if you develop shortness of breath, especially when lying down.  This information is not intended to replace advice given to you by your health care provider. Make sure you discuss any questions you have with your health care provider. Document Revised: 07/20/2021 Document Reviewed: 07/20/2021 Elsevier Patient Education  2024 [employer].

## 2024-04-30 NOTE — Addendum Note (Signed)
 Addended by: Tommas Fragmin A on: 04/30/2024 04:06 PM   Modules accepted: Level of Service

## 2024-05-04 ENCOUNTER — Ambulatory Visit

## 2024-06-02 ENCOUNTER — Other Ambulatory Visit: Payer: Self-pay | Admitting: Nurse Practitioner

## 2024-06-02 DIAGNOSIS — E1159 Type 2 diabetes mellitus with other circulatory complications: Secondary | ICD-10-CM

## 2024-06-02 DIAGNOSIS — E034 Atrophy of thyroid (acquired): Secondary | ICD-10-CM

## 2024-06-02 DIAGNOSIS — E119 Type 2 diabetes mellitus without complications: Secondary | ICD-10-CM

## 2024-07-10 ENCOUNTER — Ambulatory Visit: Payer: Medicare Other | Admitting: Nurse Practitioner

## 2024-07-10 ENCOUNTER — Encounter: Payer: Self-pay | Admitting: Nurse Practitioner

## 2024-07-10 ENCOUNTER — Ambulatory Visit: Payer: Self-pay | Admitting: Nurse Practitioner

## 2024-07-10 VITALS — BP 142/80 | HR 83 | Temp 97.4°F | Ht 64.0 in | Wt 198.0 lb

## 2024-07-10 DIAGNOSIS — I5042 Chronic combined systolic (congestive) and diastolic (congestive) heart failure: Secondary | ICD-10-CM | POA: Diagnosis not present

## 2024-07-10 DIAGNOSIS — I4821 Permanent atrial fibrillation: Secondary | ICD-10-CM | POA: Diagnosis not present

## 2024-07-10 DIAGNOSIS — E1159 Type 2 diabetes mellitus with other circulatory complications: Secondary | ICD-10-CM | POA: Diagnosis not present

## 2024-07-10 DIAGNOSIS — E119 Type 2 diabetes mellitus without complications: Secondary | ICD-10-CM | POA: Diagnosis not present

## 2024-07-10 DIAGNOSIS — M8588 Other specified disorders of bone density and structure, other site: Secondary | ICD-10-CM

## 2024-07-10 DIAGNOSIS — I152 Hypertension secondary to endocrine disorders: Secondary | ICD-10-CM | POA: Diagnosis not present

## 2024-07-10 DIAGNOSIS — E1142 Type 2 diabetes mellitus with diabetic polyneuropathy: Secondary | ICD-10-CM

## 2024-07-10 DIAGNOSIS — Z7984 Long term (current) use of oral hypoglycemic drugs: Secondary | ICD-10-CM

## 2024-07-10 DIAGNOSIS — E1169 Type 2 diabetes mellitus with other specified complication: Secondary | ICD-10-CM | POA: Diagnosis not present

## 2024-07-10 DIAGNOSIS — E034 Atrophy of thyroid (acquired): Secondary | ICD-10-CM | POA: Diagnosis not present

## 2024-07-10 DIAGNOSIS — R600 Localized edema: Secondary | ICD-10-CM | POA: Diagnosis not present

## 2024-07-10 DIAGNOSIS — L03115 Cellulitis of right lower limb: Secondary | ICD-10-CM | POA: Diagnosis not present

## 2024-07-10 DIAGNOSIS — E785 Hyperlipidemia, unspecified: Secondary | ICD-10-CM | POA: Diagnosis not present

## 2024-07-10 DIAGNOSIS — Z8542 Personal history of malignant neoplasm of other parts of uterus: Secondary | ICD-10-CM | POA: Diagnosis not present

## 2024-07-10 LAB — BAYER DCA HB A1C WAIVED: HB A1C (BAYER DCA - WAIVED): 6.8 % — ABNORMAL HIGH (ref 4.8–5.6)

## 2024-07-10 MED ORDER — FUROSEMIDE 40 MG PO TABS: ORAL_TABLET | ORAL | 1 refills | Status: DC

## 2024-07-10 MED ORDER — METFORMIN HCL 500 MG PO TABS
500.0000 mg | ORAL_TABLET | Freq: Two times a day (BID) | ORAL | 1 refills | Status: AC
Start: 2024-07-10 — End: ?

## 2024-07-10 MED ORDER — GABAPENTIN 300 MG PO CAPS
300.0000 mg | ORAL_CAPSULE | Freq: Two times a day (BID) | ORAL | 1 refills | Status: AC
Start: 2024-07-10 — End: ?

## 2024-07-10 MED ORDER — RIVAROXABAN 20 MG PO TABS: ORAL_TABLET | ORAL | 1 refills | Status: DC

## 2024-07-10 MED ORDER — LEVOTHYROXINE SODIUM 125 MCG PO TABS: 125.0000 ug | ORAL_TABLET | Freq: Every day | ORAL | 1 refills | Status: AC

## 2024-07-10 MED ORDER — METOPROLOL SUCCINATE ER 25 MG PO TB24
25.0000 mg | ORAL_TABLET | Freq: Every day | ORAL | 1 refills | Status: AC
Start: 2024-07-10 — End: ?

## 2024-07-10 NOTE — Progress Notes (Signed)
 Subjective:    Patient ID: Debra Campbell, female    DOB: 15-Jun-1932, 89 y.o.   MRN: [id number]   Chief Complaint: medical management of chronic issues     HPI:  Debra Campbell is a 89 y.o. who identifies as a female who was assigned female at birth.   Lives with: husband Work history: retired   Water engineer in today for follow up of the following chronic medical issues:  1. Hypertension associated with type 2 diabetes mellitus (HCC) No c/o chest pain, sob or headache. Does not check blood presure at home. BP Readings from Last 3 Encounters:  04/30/24 124/75  04/26/24 (!) 143/73  04/24/24 117/79     2. Permanent atrial fibrillation (HCC) CHA2DS2Vasc = 5; Xarelto  No palpitations or heart racing  3. Diabetes mellitus treated with oral medication (HCC) Does not check blood sugars very often at home. Lab Results  Component Value Date   HGBA1C 6.4 (H) 01/09/2024     4. Diabetic polyneuropathy associated with type 2 diabetes mellitus (HCC) Has some numbness in toes  5. Hyperlipidemia associated with type 2 diabetes mellitus (HCC) Does not really watch diet and does no dedicated exercise Lab Results  Component Value Date   CHOL 188 01/09/2024   HDL 63 01/09/2024   LDLCALC 100 (H) 01/09/2024   TRIG 144 01/09/2024   CHOLHDL 3.0 01/09/2024     6. Hypothyroidism due to acquired atrophy of thyroid  No issues that she is aware Lab Results  Component Value Date   TSH 0.946 07/19/2022     7. Peripheral edema Has edema daiLy  8. Atrial fib 9.Congestive heart failure Denies palpitation or heart racing. No SOB usually. Last saw cardiology on 10/14/23. According to office note review- no change in plan of care. Is still on xeralto with no bleeding issues.  10. History of Endometrial cancer (HCC) No reoccurrence. No longer sees oncology  11. Osteopenia of lumbar spine Last dexascan was done on 01/21/21. T score was -2.0  12. Morbid obesity (HCC) No recent weight  changes  Wt Readings from Last 3 Encounters:  07/10/24 198 lb (89.8 kg)  04/30/24 195 lb 6.4 oz (88.6 kg)  04/26/24 195 lb 12.8 oz (88.8 kg)   BMI Readings from Last 3 Encounters:  07/10/24 33.99 kg/m  04/30/24 33.54 kg/m  04/26/24 33.61 kg/m         New complaints: None today   Allergies  Allergen Reactions   Ace Inhibitors Cough   Iohexol      Desc: HIVES 40 YEARS AGO    Januvia  [Sitagliptin ] Other (See Comments)    Constipation and body aches   Statins     myalgia   Penicillins Swelling and Rash    Has patient had a PCN reaction causing immediate rash, facial/tongue/throat swelling, SOB or lightheadedness with hypotension: Yes Has patient had a PCN reaction causing severe rash involving mucus membranes or skin necrosis: No Has patient had a PCN reaction that required hospitalization Yes Has patient had a PCN reaction occurring within the last 10 years: No If all of the above answers are NO, then may proceed with Cephalosporin use.    Sulfa Antibiotics Rash   Outpatient Encounter Medications as of 07/10/2024  Medication Sig   Accu-Chek Softclix Lancets lancets Check BS daily Dx E11.42   acetaminophen  (TYLENOL ) 500 MG tablet Take 1 tablet (500 mg total) by mouth every 8 (eight) hours as needed.   Blood Glucose Monitoring Suppl (ACCU-CHEK GUIDE ME) w/Device KIT  TEST BS TID DX E11.42   doxycycline  (VIBRA -TABS) 100 MG tablet Take 1 tablet (100 mg total) by mouth 2 (two) times daily.   furosemide  (LASIX ) 40 MG tablet TAKE 2 TABLETS 2 TIMES DAILY. MAY TAKE AN ADDITIONAL 1 TABLET IF WEIGHT GAIN OF 3LBS OR MORE OVERNIGHT.   furosemide  (LASIX ) 40 MG tablet 80 mg in AM and 40 mg in pm   gabapentin  (NEURONTIN ) 300 MG capsule Take 1 capsule (300 mg total) by mouth 2 (two) times daily.   glucose blood (ACCU-CHEK GUIDE) test strip TEST BS TID DX E11.42   levothyroxine  (SYNTHROID ) 125 MCG tablet TAKE 1 TABLET EVERY DAY   metFORMIN  (GLUCOPHAGE ) 500 MG tablet TAKE 1 TABLET  TWICE DAILY WITH MEALS   metolazone  (ZAROXOLYN ) 2.5 MG tablet 1 po for 2 days then prn   metoprolol  succinate (TOPROL -XL) 25 MG 24 hr tablet TAKE 1 TABLET EVERY DAY   nystatin  cream (MYCOSTATIN ) Apply 1 Application topically 2 (two) times daily.   rivaroxaban  (XARELTO ) 20 MG TABS tablet TAKE 1 TABLET BY MOUTH EVERY DAY WITH BREAKFAST   [DISCONTINUED] doxycycline  (VIBRA -TABS) 100 MG tablet Take 1 tablet (100 mg total) by mouth 2 (two) times daily. 1 po bid (Patient not taking: Reported on 04/20/2024)   [DISCONTINUED] levothyroxine  (SYNTHROID ) 125 MCG tablet Take 1 tablet (125 mcg total) by mouth daily.   [DISCONTINUED] metFORMIN  (GLUCOPHAGE ) 500 MG tablet Take 1 tablet (500 mg total) by mouth 2 (two) times daily with a meal.   [DISCONTINUED] metoprolol  succinate (TOPROL -XL) 25 MG 24 hr tablet Take 1 tablet (25 mg total) by mouth daily.   No facility-administered encounter medications on file as of 07/10/2024.    Past Surgical History:  Procedure Laterality Date   ABDOMINAL HYSTERECTOMY Bilateral 06/26/2013   Procedure: TOTAL ABDOMINAL HYSTERECTOMY WITH BILATERAL SALPINGO OOPHERECTOMY WITH PELVIC LYMPHADNECTOMY;  Surgeon: Toribio LITTIE Percy, MD;  Location: [facility];  Service: Gynecology;  Laterality: Bilateral;   BACK SURGERY  06/26/2013   ECTOPIC PREGNANCY SURGERY     HERNIA REPAIR     HYSTEROSCOPY WITH D & C N/A 04/26/2013   Procedure: DILATATION AND CURETTAGE /HYSTEROSCOPY;  Surgeon: Lynwood KANDICE Solomons, MD;  Location: [facility];  Service: Gynecology;  Laterality: N/A;   JOINT REPLACEMENT     LAPAROTOMY     for ectopic pregnancy   LAPAROTOMY N/A 06/26/2013   Procedure: EXPLORATORY LAPAROTOMY;  Surgeon: Toribio LITTIE Percy, MD;  Location: [facility];  Service: Gynecology;  Laterality: N/A;   lt knee arthroscopy     rt total hip replacement     TONSILLECTOMY     TOTAL KNEE ARTHROPLASTY Left 12/22/2016   Procedure: LEFT TOTAL KNEE ARTHROPLASTY;  Surgeon: Tanda Heading, MD;  Location: [facility];   Service: Orthopedics;  Laterality: Left;   TRANSTHORACIC ECHOCARDIOGRAM  09/2018   EF 60-65%. Mild AS.  Mod-Severe PAH 60 mmHg   WISDOM TOOTH EXTRACTION      Family History  Problem Relation Age of Onset   Heart disease Father    Thyroid  disease Father    Heart attack Father    Heart disease Sister        open heart surgery   Cancer Sister        breast   Neuropathy Sister        secondary to cancer treatment   Breast cancer Sister    Suicidality Brother 81   Arthritis Mother    Cancer Daughter    Breast cancer Daughter  Controlled substance contract: n/a     Review of Systems     Objective:   Physical Exam Vitals and nursing note reviewed.  Constitutional:      General: She is not in acute distress.    Appearance: Normal appearance. She is well-developed.  HENT:     Head: Normocephalic.     Right Ear: Tympanic membrane normal.     Left Ear: Tympanic membrane normal.     Nose: Nose normal.     Mouth/Throat:     Mouth: Mucous membranes are moist.  Eyes:     Pupils: Pupils are equal, round, and reactive to light.  Neck:     Vascular: No carotid bruit or JVD.  Cardiovascular:     Rate and Rhythm: Normal rate. Rhythm irregular.     Heart sounds: Murmur (3/6) heard.  Pulmonary:     Effort: Pulmonary effort is normal. No respiratory distress.     Breath sounds: Normal breath sounds. No wheezing or rales.  Chest:     Chest wall: No tenderness.  Abdominal:     General: Bowel sounds are normal. There is no distension or abdominal bruit.     Palpations: Abdomen is soft. There is no hepatomegaly, splenomegaly, mass or pulsatile mass.     Tenderness: There is no abdominal tenderness.  Musculoskeletal:        General: Normal range of motion.     Cervical back: Normal range of motion and neck supple.     Right lower leg: Edema (3+) present.     Left lower leg: Edema (3+) present.     Comments: Ambulating with walker  Lymphadenopathy:     Cervical: No  cervical adenopathy.  Skin:    General: Skin is warm and dry.     Coloration: Skin is pale.  Neurological:     Mental Status: She is alert and oriented to person, place, and time.     Deep Tendon Reflexes: Reflexes are normal and symmetric.  Psychiatric:        Behavior: Behavior normal.        Thought Content: Thought content normal.        Judgment: Judgment normal.     HGBa1c 6.8%  BP (!) 142/80   Pulse 83   Temp (!) 97.4 F (36.3 C) (Temporal)   Ht 5' 4 (1.626 m)   Wt 198 lb (89.8 kg)   SpO2 97%   BMI 33.99 kg/m       Assessment & Plan:  JAMIRA BARFUSS comes in today with chief complaint of medical management of chronic issues    Diagnosis and orders addressed:  1. Hypertension associated with type 2 diabetes mellitus (HCC) Low sodium diet - CBC with Differential/Platelet - CMP14+EGFR - rivaroxaban  (XARELTO ) 20 MG TABS tablet; TAKE 1 TABLET BY MOUTH EVERY DAY WITH BREAKFAST  Dispense: 90 tablet; Refill: 1 - metoprolol  succinate (TOPROL -XL) 25 MG 24 hr tablet; Take 1 tablet (25 mg total) by mouth daily.  Dispense: 90 tablet; Refill: 1  2. Permanent atrial fibrillation (HCC) CHA2DS2Vasc = 5; Xarelto  Avoid caffeine - rivaroxaban  (XARELTO ) 20 MG TABS tablet; TAKE 1 TABLET BY MOUTH EVERY DAY WITH BREAKFAST  Dispense: 90 tablet; Refill: 1  3. Diabetes mellitus treated with oral medication (HCC) Continue to watch carbs in diet - Bayer DCA Hb A1c Waived - metFORMIN  (GLUCOPHAGE ) 500 MG tablet; Take 1 tablet (500 mg total) by mouth 2 (two) times daily with a meal.  Dispense: 180 tablet; Refill: 1  4. Diabetic polyneuropathy associated with type 2 diabetes mellitus (HCC) Do not go barefooted - gabapentin  (NEURONTIN ) 300 MG capsule; Take 1 capsule (300 mg total) by mouth 2 (two) times daily.  Dispense: 180 capsule; Refill: 1  5. Hyperlipidemia associated with type 2 diabetes mellitus (HCC) Low fat diet - Lipid panel - rivaroxaban  (XARELTO ) 20 MG TABS tablet; TAKE 1  TABLET BY MOUTH EVERY DAY WITH BREAKFAST  Dispense: 90 tablet; Refill: 1  6. Hypothyroidism due to acquired atrophy of thyroid  Labs pending - levothyroxine  (SYNTHROID ) 125 MCG tablet; Take 1 tablet (125 mcg total) by mouth daily.  Dispense: 90 tablet; Refill: 1  7. Peripheral edema Take zaroxlyn for 2 days Compression socks during the day - metolazone  (ZAROXOLYN ) 2.5 MG tablet; 1 po for 2 days then prn  Dispense: 20 tablet; Refill: 0 - Apply unna boot - furosemide  (LASIX ) 40 MG tablet; Take 2 tablets (80 mg total) by mouth 2 (two) times daily. TAKE 2 TABLETS TWICE A DAY.MAY TAKE AN ADDITIONAL     1 TABLET IF WEIGHT GAIN OF 3LBS OR MORE OVERNIGHT.  Dispense: 360 tablet; Refill: 1  8. Endometrial cancer (HCC)  9. Osteopenia of lumbar spine Weight bearing exercise  10. Morbid obesity (HCC) Discussed diet and exercise for person with BMI >25 Will recheck weight in 3-6 months   Labs pending Health Maintenance reviewed Diet and exercise encouraged  Follow up plan: 6 months   [name] Gladis, FNP

## 2024-07-10 NOTE — Patient Instructions (Signed)
 Fall Prevention in the Home, Adult Falls can cause injuries and can happen to people of all ages. There are many things you can do to make your home safer and to help prevent falls. What actions can I take to prevent falls? General information Use good lighting in all rooms. Make sure to: Replace any light bulbs that burn out. Turn on the lights in dark areas and use night-lights. Keep items that you use often in easy-to-reach places. Lower the shelves around your home if needed. Move furniture so that there are clear paths around it. Do not use throw rugs or other things on the floor that can make you trip. If any of your floors are uneven, fix them. Add color or contrast paint or tape to clearly mark and help you see: Grab bars or handrails. First and last steps of staircases. Where the edge of each step is. If you use a ladder or stepladder: Make sure that it is fully opened. Do not climb a closed ladder. Make sure the sides of the ladder are locked in place. Have someone hold the ladder while you use it. Know where your pets are as you move through your home. What can I do in the bathroom?     Keep the floor dry. Clean up any water on the floor right away. Remove soap buildup in the bathtub or shower. Buildup makes bathtubs and showers slippery. Use non-skid mats or decals on the floor of the bathtub or shower. Attach bath mats securely with double-sided, non-slip rug tape. If you need to sit down in the shower, use a non-slip stool. Install grab bars by the toilet and in the bathtub and shower. Do not use towel bars as grab bars. What can I do in the bedroom? Make sure that you have a light by your bed that is easy to reach. Do not use any sheets or blankets on your bed that hang to the floor. Have a firm chair or bench with side arms that you can use for support when you get dressed. What can I do in the kitchen? Clean up any spills right away. If you need to reach something  above you, use a step stool with a grab bar. Keep electrical cords out of the way. Do not use floor polish or wax that makes floors slippery. What can I do with my stairs? Do not leave anything on the stairs. Make sure that you have a light switch at the top and the bottom of the stairs. Make sure that there are handrails on both sides of the stairs. Fix handrails that are broken or loose. Install non-slip stair treads on all your stairs if they do not have carpet. Avoid having throw rugs at the top or bottom of the stairs. Choose a carpet that does not hide the edge of the steps on the stairs. Make sure that the carpet is firmly attached to the stairs. Fix carpet that is loose or worn. What can I do on the outside of my home? Use bright outdoor lighting. Fix the edges of walkways and driveways and fix any cracks. Clear paths of anything that can make you trip, such as tools or rocks. Add color or contrast paint or tape to clearly mark and help you see anything that might make you trip as you walk through a door, such as a raised step or threshold. Trim any bushes or trees on paths to your home. Check to see if handrails are loose  or broken and that both sides of all steps have handrails. Install guardrails along the edges of any raised decks and porches. Have leaves, snow, or ice cleared regularly. Use sand, salt, or ice melter on paths if you live where there is ice and snow during the winter. Clean up any spills in your garage right away. This includes grease or oil spills. What other actions can I take? Review your medicines with your doctor. Some medicines can cause dizziness or changes in blood pressure, which increase your risk of falling. Wear shoes that: Have a low heel. Do not wear high heels. Have rubber bottoms and are closed at the toe. Feel good on your feet and fit well. Use tools that help you move around if needed. These include: Canes. Walkers. Scooters. Crutches. Ask  your doctor what else you can do to help prevent falls. This may include seeing a physical therapist to learn to do exercises to move better and get stronger. Where to find more information Centers for Disease Control and Prevention, [employer]: TonerPromos.no [employer] on Aging: BaseRingTones.pl National Institute on Aging: BaseRingTones.pl Contact a doctor if: You are afraid of falling at home. You feel weak, drowsy, or dizzy at home. You fall at home. Get help right away if you: Lose consciousness or have trouble moving after a fall. Have a fall that causes a head injury. These symptoms may be an emergency. Get help right away. Call 911. Do not wait to see if the symptoms will go away. Do not drive yourself to the hospital. This information is not intended to replace advice given to you by your health care provider. Make sure you discuss any questions you have with your health care provider. Document Revised: 07/19/2022 Document Reviewed: 07/19/2022 Elsevier Patient Education  2024 [employer].

## 2024-07-11 LAB — CBC WITH DIFFERENTIAL/PLATELET
Basophils Absolute: 0.1 x10E3/uL (ref 0.0–0.2)
Basos: 1 %
EOS (ABSOLUTE): 0.2 x10E3/uL (ref 0.0–0.4)
Eos: 5 %
Hematocrit: 35.6 % (ref 34.0–46.6)
Hemoglobin: 11.8 g/dL (ref 11.1–15.9)
Immature Grans (Abs): 0 x10E3/uL (ref 0.0–0.1)
Immature Granulocytes: 0 %
Lymphocytes Absolute: 1.7 x10E3/uL (ref 0.7–3.1)
Lymphs: 33 %
MCH: 32 pg (ref 26.6–33.0)
MCHC: 33.1 g/dL (ref 31.5–35.7)
MCV: 97 fL (ref 79–97)
Monocytes Absolute: 0.5 x10E3/uL (ref 0.1–0.9)
Monocytes: 9 %
Neutrophils Absolute: 2.7 x10E3/uL (ref 1.4–7.0)
Neutrophils: 52 %
Platelets: 194 x10E3/uL (ref 150–450)
RBC: 3.69 x10E6/uL — ABNORMAL LOW (ref 3.77–5.28)
RDW: 14.2 % (ref 11.7–15.4)
WBC: 5.1 x10E3/uL (ref 3.4–10.8)

## 2024-07-11 LAB — CMP14+EGFR
ALT: 12 IU/L (ref 0–32)
AST: 20 IU/L (ref 0–40)
Albumin: 4 g/dL (ref 3.6–4.6)
Alkaline Phosphatase: 56 IU/L (ref 44–121)
BUN/Creatinine Ratio: 12 (ref 12–28)
BUN: 14 mg/dL (ref 10–36)
Bilirubin Total: 0.6 mg/dL (ref 0.0–1.2)
CO2: 19 mmol/L — ABNORMAL LOW (ref 20–29)
Calcium: 9.1 mg/dL (ref 8.7–10.3)
Chloride: 102 mmol/L (ref 96–106)
Creatinine, Ser: 1.17 mg/dL — ABNORMAL HIGH (ref 0.57–1.00)
Globulin, Total: 3.2 g/dL (ref 1.5–4.5)
Glucose: 136 mg/dL — ABNORMAL HIGH (ref 70–99)
Potassium: 3.9 mmol/L (ref 3.5–5.2)
Sodium: 140 mmol/L (ref 134–144)
Total Protein: 7.2 g/dL (ref 6.0–8.5)
eGFR: 44 mL/min/1.73 — ABNORMAL LOW

## 2024-07-11 LAB — LIPID PANEL
Chol/HDL Ratio: 3.7 ratio (ref 0.0–4.4)
Cholesterol, Total: 188 mg/dL (ref 100–199)
HDL: 51 mg/dL
LDL Chol Calc (NIH): 111 mg/dL — ABNORMAL HIGH (ref 0–99)
Triglycerides: 150 mg/dL — ABNORMAL HIGH (ref 0–149)
VLDL Cholesterol Cal: 26 mg/dL (ref 5–40)

## 2024-07-11 LAB — VITAMIN B12: Vitamin B-12: 566 pg/mL (ref 232–1245)

## 2024-08-05 NOTE — Progress Notes (Unsigned)
 Cardiology Office Note:   Date:  08/05/2024  ID:  Debra Campbell, DOB 03/07/1932, MRN [id number] PCP: Gladis Mustard, FNP  [facility] Providers Cardiologist:  Alm Clay, MD {  History of Present Illness:   Debra Campbell is a 89 y.o. female PMH of HTN, atrial fibrillation CHA2DS2-VASc score 5, aortic calcification, mild aortic stenosis, HLD, hypothyroidism, diabetic neuropathy, osteoarthritis, obesity, peripheral edema, and is status post left total knee replacement.  She was previously seen by Dr Clay but is moving because she lives in this area.    ***    **  She was seen in the emergency department 11/28/2021 with nephrolithiasis/ureterolithiasis.  She reported flank pain with hematuria.  She was noted to have a left ureteral stone with hydronephrosis.  She was treated with analgesia and narcotics.  She was discharged on Flomax  and referred to urology.   She was seen in follow-up by Dr. Clay on 04/12/2022.  During that time she continued to be stable from a cardiac standpoint.  She noted some heart fluttering with getting upset and exertion.  She was not very active and was using a cane in the house and a walker for longer distances.  She was limited in her mobility due to back and hip pain as well as knee pain.     She presented to the clinic 01/06/23 for follow-up evaluation and stated she continued to use her walker with ambulation.  She was no longer using her cane and was using her walker in her house due to her back and left lower extremity pain.  This  limited her physical activity.  Her blood pressure was well-controlled at 128/78.  Her EKG showed atrial fibrillation 74 bpm.  Her biggest complaint was with her refills of her medications.  She had trouble with [facility].  She requested 90-day supply and 3 refills.  I asked her to increase the fiber in her diet, we continued her  medication regimen and planned follow-up in 9 months.   She presents to the clinic today for  follow-up evaluation and states she feels like she is doing fairly well.  She recently had a pair of Unna boots on her bilateral lower extremities.  She believes that she had an allergic reaction to the boots.  She has multiple areas of 0.5 cm x 0.5 cm round erythema.  She was prescribed doxycycline  for these.  I have instructed her to keep the area clean and dry.  We reviewed the importance of lower extremity support stockings and daily weights.  She expressed understanding.  Her blood pressure is not well-controlled today.  Her heart rate is rate controlled.  She reports compliance with her Xarelto  and denies bleeding issues.  I will give her the Kinder support stocking sheet, have her maintain her physical activity and plan follow-up in 6 months.   Today she denies chest pain, shortness of breath, lower extremity edema, fatigue, palpitations, melena, hematuria, hemoptysis, diaphoresis, weakness, presyncope, syncope, orthopnea, and PND.        ROS: ***  Studies Reviewed:    EKG:       ***  Risk Assessment/Calculations:   {Does this patient have ATRIAL FIBRILLATION?:[id number]} No BP recorded.  {Refresh Note OR Click here to enter BP  :1}***        Physical Exam:   VS:  There were no vitals taken for this visit.   Wt Readings from Last 3 Encounters:  07/10/24 198 lb (89.8 kg)  04/30/24 195 lb  6.4 oz (88.6 kg)  04/26/24 195 lb 12.8 oz (88.8 kg)     GEN: Well nourished, well developed in no acute distress NECK: No JVD; No carotid bruits CARDIAC: ***RR, *** murmurs, rubs, gallops RESPIRATORY:  Clear to auscultation without rales, wheezing or rhonchi  ABDOMEN: Soft, non-tender, non-distended EXTREMITIES:  No edema; No deformity   ASSESSMENT AND PLAN:   Atrial fibrillation:  *** -Heart rate today 75 bpm.  Denies accelerated or irregular heartbeat.  Continues to be cardiac unaware.  Compliant with Xarelto  and denies bleeding issues. Continue Xarelto , metoprolol  Avoid triggers  caffeine, chocolate, EtOH, dehydration etc.   Essential hypertension:  ***   -BP today 128/68 Continue metoprolol  Heart healthy low-sodium diet Increase physical activity as tolerated Maintain blood pressure log   Hyperlipidemia:  ***  -LDL 117 on 10/19/22.  Previously noted to wish to defer cholesterol reducing medication Heart healthy low-sodium high-fiber diet Increase physical activity as tolerated Request labs from PCP   Aortic calcification:  *** , mild aortic stenosis-performing baseline physical activity. Repeat echocardiogram when clinically indicated   Pulmonary hypertension:  *** -able to perform activities of daily living independently.  Continues to be sedentary.  Previously felt to have some diastolic dysfunction in the setting of atrial fibrillation and OHS. Increase physical activity as tolerated Continue to monitor   Rash:  ***  -bilateral lower extremity.  Appeared after having bilateral Unna boots.  She was prescribed doxycycline  for lower extremity cellulitis.  She has not yet started taking the medication.  I outlined to her areas of erythema.  She presents with her son.  I have instructed her to continue to keep the areas clean and dry with daily washing and lower extremity support stockings as well.  I instructed her to start doxycycline  if she develops any signs or symptoms of cellulitis. Follow-up with PCP       Follow up ***  Signed, Lynwood Schilling, MD

## 2024-08-08 ENCOUNTER — Encounter: Payer: Self-pay | Admitting: Cardiology

## 2024-08-08 ENCOUNTER — Ambulatory Visit (INDEPENDENT_AMBULATORY_CARE_PROVIDER_SITE_OTHER): Admitting: Cardiology

## 2024-08-08 VITALS — BP 140/68 | HR 77 | Ht 64.0 in | Wt 197.0 lb

## 2024-08-08 DIAGNOSIS — I482 Chronic atrial fibrillation, unspecified: Secondary | ICD-10-CM

## 2024-08-08 DIAGNOSIS — I350 Nonrheumatic aortic (valve) stenosis: Secondary | ICD-10-CM

## 2024-08-08 DIAGNOSIS — I10 Essential (primary) hypertension: Secondary | ICD-10-CM

## 2024-08-08 DIAGNOSIS — I272 Pulmonary hypertension, unspecified: Secondary | ICD-10-CM | POA: Diagnosis not present

## 2024-08-08 DIAGNOSIS — E785 Hyperlipidemia, unspecified: Secondary | ICD-10-CM | POA: Diagnosis not present

## 2024-08-08 NOTE — Patient Instructions (Addendum)

## 2024-10-03 ENCOUNTER — Other Ambulatory Visit: Payer: Self-pay | Admitting: *Deleted

## 2024-10-03 DIAGNOSIS — L03115 Cellulitis of right lower limb: Secondary | ICD-10-CM

## 2024-10-03 DIAGNOSIS — R600 Localized edema: Secondary | ICD-10-CM

## 2024-10-21 DIAGNOSIS — Z23 Encounter for immunization: Secondary | ICD-10-CM | POA: Diagnosis not present

## 2024-10-27 ENCOUNTER — Emergency Department (HOSPITAL_COMMUNITY)

## 2024-10-27 ENCOUNTER — Emergency Department (HOSPITAL_COMMUNITY)
Admission: EM | Admit: 2024-10-27 | Discharge: 2024-10-27 | Disposition: A | Discharge status: Home / Self Care | Attending: Emergency Medicine | Admitting: Emergency Medicine

## 2024-10-27 ENCOUNTER — Encounter (HOSPITAL_COMMUNITY): Payer: Self-pay

## 2024-10-27 ENCOUNTER — Other Ambulatory Visit: Payer: Self-pay

## 2024-10-27 DIAGNOSIS — K625 Hemorrhage of anus and rectum: Secondary | ICD-10-CM | POA: Diagnosis present

## 2024-10-27 DIAGNOSIS — K439 Ventral hernia without obstruction or gangrene: Secondary | ICD-10-CM | POA: Diagnosis not present

## 2024-10-27 DIAGNOSIS — E119 Type 2 diabetes mellitus without complications: Secondary | ICD-10-CM | POA: Insufficient documentation

## 2024-10-27 DIAGNOSIS — Z79899 Other long term (current) drug therapy: Secondary | ICD-10-CM | POA: Diagnosis not present

## 2024-10-27 DIAGNOSIS — K5641 Fecal impaction: Secondary | ICD-10-CM | POA: Insufficient documentation

## 2024-10-27 DIAGNOSIS — E039 Hypothyroidism, unspecified: Secondary | ICD-10-CM | POA: Diagnosis not present

## 2024-10-27 DIAGNOSIS — I110 Hypertensive heart disease with heart failure: Secondary | ICD-10-CM | POA: Diagnosis not present

## 2024-10-27 DIAGNOSIS — Z8542 Personal history of malignant neoplasm of other parts of uterus: Secondary | ICD-10-CM | POA: Diagnosis not present

## 2024-10-27 DIAGNOSIS — Z7901 Long term (current) use of anticoagulants: Secondary | ICD-10-CM | POA: Insufficient documentation

## 2024-10-27 DIAGNOSIS — I509 Heart failure, unspecified: Secondary | ICD-10-CM | POA: Insufficient documentation

## 2024-10-27 DIAGNOSIS — Z87442 Personal history of urinary calculi: Secondary | ICD-10-CM | POA: Diagnosis not present

## 2024-10-27 DIAGNOSIS — R109 Unspecified abdominal pain: Secondary | ICD-10-CM | POA: Diagnosis not present

## 2024-10-27 DIAGNOSIS — Z7984 Long term (current) use of oral hypoglycemic drugs: Secondary | ICD-10-CM | POA: Insufficient documentation

## 2024-10-27 DIAGNOSIS — N281 Cyst of kidney, acquired: Secondary | ICD-10-CM | POA: Diagnosis not present

## 2024-10-27 LAB — COMPREHENSIVE METABOLIC PANEL WITH GFR
ALT: 18 U/L (ref 0–44)
AST: 30 U/L (ref 15–41)
Albumin: 3.1 g/dL — ABNORMAL LOW (ref 3.5–5.0)
Alkaline Phosphatase: 43 U/L (ref 38–126)
Anion gap: 12 (ref 5–15)
BUN: 15 mg/dL (ref 8–23)
CO2: 22 mmol/L (ref 22–32)
Calcium: 8.3 mg/dL — ABNORMAL LOW (ref 8.9–10.3)
Chloride: 104 mmol/L (ref 98–111)
Creatinine, Ser: 1.01 mg/dL — ABNORMAL HIGH (ref 0.44–1.00)
GFR, Estimated: 53 mL/min — ABNORMAL LOW (ref >60)
Glucose, Bld: 204 mg/dL — ABNORMAL HIGH (ref 70–99)
Potassium: 3.8 mmol/L (ref 3.5–5.1)
Sodium: 138 mmol/L (ref 135–145)
Total Bilirubin: 1.6 mg/dL — ABNORMAL HIGH (ref 0.0–1.2)
Total Protein: 6.4 g/dL — ABNORMAL LOW (ref 6.5–8.1)

## 2024-10-27 LAB — CBC
HCT: 33.9 % — ABNORMAL LOW (ref 36.0–46.0)
Hemoglobin: 11.1 g/dL — ABNORMAL LOW (ref 12.0–15.0)
MCH: 32 pg (ref 26.0–34.0)
MCHC: 32.7 g/dL (ref 30.0–36.0)
MCV: 97.7 fL (ref 80.0–100.0)
Platelets: 130 K/uL — ABNORMAL LOW (ref 150–400)
RBC: 3.47 MIL/uL — ABNORMAL LOW (ref 3.87–5.11)
RDW: 13.7 % (ref 11.5–15.5)
WBC: 7.6 K/uL (ref 4.0–10.5)
nRBC: 0 % (ref 0.0–0.2)

## 2024-10-27 LAB — PROTIME-INR
INR: 1.8 — ABNORMAL HIGH (ref 0.8–1.2)
Prothrombin Time: 21.7 s — ABNORMAL HIGH (ref 11.4–15.2)

## 2024-10-27 MED ORDER — SMOG ENEMA
960.0000 mL | Freq: Once | RECTAL | Status: AC
  Administered 2024-10-27: 960 mL via RECTAL
  Filled 2024-10-27: qty 960

## 2024-10-27 MED ORDER — ONDANSETRON HCL 4 MG/2ML IJ SOLN
4.0000 mg | Freq: Once | INTRAMUSCULAR | Status: AC
Start: 2024-10-27 — End: 2024-10-27
  Administered 2024-10-27: 4 mg via INTRAVENOUS
  Filled 2024-10-27: qty 2

## 2024-10-27 MED ORDER — POLYETHYLENE GLYCOL 3350 17 GM/SCOOP PO POWD: 17.0000 g | Freq: Every day | ORAL | 0 refills | Status: AC

## 2024-10-27 MED ORDER — HYDROMORPHONE HCL 1 MG/ML IJ SOLN
0.5000 mg | Freq: Once | INTRAMUSCULAR | Status: AC
  Administered 2024-10-27: 0.5 mg via INTRAVENOUS
  Filled 2024-10-27: qty 1

## 2024-10-27 NOTE — ED Provider Notes (Signed)
 [place] EMERGENCY DEPARTMENT AT [facility] Provider Note  CSN: [id number] Arrival date & time: 10/27/24 1144  Chief Complaint(s) Rectal Bleeding  HPI Debra Campbell is a 89 y.o. female history of diabetes, atrial fibrillation on Xarelto , CHF, hypertension, hyperlipidemia presenting to the emergency department with rectal pain.  Patient reports rectal pain since yesterday.  She reports that she felt constipated, tried to give herself an enema and then developed severe pain.  She reports the pain is persistent.  She denies similar episode.  Reports that she usually has regular bowel movements.  She reports some lower abdominal pain.  No fevers or chills.  No chest pain or shortness of breath.  No syncope.   Past Medical History Past Medical History:  Diagnosis Date   Aortic calcification 2013   Atherosclerotic calcifications of the abdominal aorta and branch noted on CT Abd/Pelvis   Arthritis    legs, lower back, hands   Cancer (HCC)    endometrial   Diabetes mellitus    Ectopic pregnancy    Eczema    Headache(784.0)    hx of   History of kidney stones    Hyperlipidemia    no meds   Hypertension    Hypothyroidism    Persistent atrial fibrillation (HCC) 09/28/2016   Relatively new Dx: Asymptomatic.  Rate controlled without medication.   SVD (spontaneous vaginal delivery)    x 3   Thyroid  disease    Patient Active Problem List   Diagnosis Date Noted   Chronic congestive heart failure (HCC) 04/30/2024   Peripheral vascular disease 04/30/2024   Mild aortic stenosis by prior echocardiogram 01/30/2020   Pulmonary hypertension, unspecified (HCC) 10/27/2018   Peripheral edema 07/25/2018   Status post total left knee replacement 12/22/2016   S/P TKR (total knee replacement) 12/22/2016   Permanent atrial fibrillation (HCC) CHA2DS2Vasc = 5; Xarelto  09/28/2016   Morbid obesity (HCC) 08/18/2015   Diabetes mellitus treated with oral medication (HCC) 05/01/2015    History of endometrial cancer 05/08/2013   Aortic calcification 11/30/2011   Hypertension associated with type 2 diabetes mellitus (HCC) 03/18/2011   Hypothyroidism 03/18/2011   Osteoarthritis 03/18/2011   Osteopenia 03/18/2011   Hyperlipidemia associated with type 2 diabetes mellitus (HCC) 03/18/2011   Diabetic neuropathy (HCC) 03/18/2011   Home Medication(s) Prior to Admission medications   Medication Sig Start Date End Date Taking? Authorizing Provider  polyethylene glycol powder (GLYCOLAX /MIRALAX ) 17 GM/SCOOP powder Take 17 g by mouth daily. Dissolve 1 capful (17g) in 4-8 ounces of liquid and take by mouth daily. 10/27/24  Yes Francesca Elsie CROME, MD  Accu-Chek Softclix Lancets lancets Check BS daily Dx E11.42 03/15/23   Gladis Mustard, FNP  acetaminophen  (TYLENOL ) 500 MG tablet Take 1 tablet (500 mg total) by mouth every 8 (eight) hours as needed. 10/13/18   Jerri Keys, MD  Blood Glucose Monitoring Suppl (ACCU-CHEK GUIDE ME) w/Device KIT TEST BS TID DX E11.42 03/14/23   Gladis Mustard, FNP  furosemide  (LASIX ) 40 MG tablet TAKE 2 TABLETS IN THE MORNING AND TAKE 1 TABLET IN THE EVENING 10/03/24   Gladis, [name], FNP  gabapentin  (NEURONTIN ) 300 MG capsule Take 1 capsule (300 mg total) by mouth 2 (two) times daily. 07/10/24   Gladis Mustard, FNP  glucose blood (ACCU-CHEK GUIDE) test strip TEST BS TID DX E11.42 03/14/23   Gladis Mustard, FNP  levothyroxine  (SYNTHROID ) 125 MCG tablet Take 1 tablet (125 mcg total) by mouth daily. 07/10/24   Gladis Mustard, FNP  metFORMIN  (GLUCOPHAGE ) 500  MG tablet Take 1 tablet (500 mg total) by mouth 2 (two) times daily with a meal. 07/10/24   Gladis, [name], FNP  metoprolol  succinate (TOPROL -XL) 25 MG 24 hr tablet Take 1 tablet (25 mg total) by mouth daily. 07/10/24   Gladis Mustard, FNP  nystatin  cream (MYCOSTATIN ) Apply 1 Application topically 2 (two) times daily. 04/04/23   Gladis [name], FNP  rivaroxaban   (XARELTO ) 20 MG TABS tablet TAKE 1 TABLET BY MOUTH EVERY DAY WITH BREAKFAST 07/10/24   Gladis Mustard, FNP                                                                                                                                    Past Surgical History Past Surgical History:  Procedure Laterality Date   ABDOMINAL HYSTERECTOMY Bilateral 06/26/2013   Procedure: TOTAL ABDOMINAL HYSTERECTOMY WITH BILATERAL SALPINGO OOPHERECTOMY WITH PELVIC LYMPHADNECTOMY;  Surgeon: Toribio LITTIE Percy, MD;  Location: [facility];  Service: Gynecology;  Laterality: Bilateral;   BACK SURGERY  06/26/2013   ECTOPIC PREGNANCY SURGERY     HERNIA REPAIR     HYSTEROSCOPY WITH D & C N/A 04/26/2013   Procedure: DILATATION AND CURETTAGE /HYSTEROSCOPY;  Surgeon: Lynwood KANDICE Solomons, MD;  Location: [facility];  Service: Gynecology;  Laterality: N/A;   JOINT REPLACEMENT     LAPAROTOMY     for ectopic pregnancy   LAPAROTOMY N/A 06/26/2013   Procedure: EXPLORATORY LAPAROTOMY;  Surgeon: Toribio LITTIE Percy, MD;  Location: [facility];  Service: Gynecology;  Laterality: N/A;   lt knee arthroscopy     rt total hip replacement     TONSILLECTOMY     TOTAL KNEE ARTHROPLASTY Left 12/22/2016   Procedure: LEFT TOTAL KNEE ARTHROPLASTY;  Surgeon: Tanda Heading, MD;  Location: [facility];  Service: Orthopedics;  Laterality: Left;   TRANSTHORACIC ECHOCARDIOGRAM  09/2018   EF 60-65%. Mild AS.  Mod-Severe PAH 60 mmHg   WISDOM TOOTH EXTRACTION     Family History Family History  Problem Relation Age of Onset   Heart disease Father    Thyroid  disease Father    Heart attack Father    Heart disease Sister        open heart surgery   Cancer Sister        breast   Neuropathy Sister        secondary to cancer treatment   Breast cancer Sister    Suicidality Brother 57   Arthritis Mother    Cancer Daughter    Breast cancer Daughter     Social History Social History   Tobacco Use   Smoking status: Never   Smokeless tobacco: Never   Vaping Use   Vaping status: Never Used  Substance Use Topics   Alcohol use: No   Drug use: No   Allergies Ace inhibitors, Iohexol, Januvia  [sitagliptin ], Statins, Penicillins, and Sulfa antibiotics  Review of Systems Review of Systems  All other systems reviewed and  are negative.   Physical Exam Vital Signs  I have reviewed the triage vital signs BP 139/71   Pulse 79   Temp 98.3 F (36.8 C) (Oral)   Resp 12   Ht 5' 4 (1.626 m)   Wt 88.9 kg   SpO2 95%   BMI 33.64 kg/m  Physical Exam Vitals and nursing note reviewed.  Constitutional:      General: She is not in acute distress.    Appearance: She is well-developed.  HENT:     Head: Normocephalic and atraumatic.     Mouth/Throat:     Mouth: Mucous membranes are moist.  Eyes:     Pupils: Pupils are equal, round, and reactive to light.  Cardiovascular:     Rate and Rhythm: Normal rate and regular rhythm.     Heart sounds: No murmur heard. Pulmonary:     Effort: Pulmonary effort is normal. No respiratory distress.     Breath sounds: Normal breath sounds.  Abdominal:     General: Abdomen is flat.     Palpations: Abdomen is soft.     Tenderness: There is abdominal tenderness (lower abdomen).  Musculoskeletal:        General: No tenderness.     Right lower leg: No edema.     Left lower leg: No edema.  Skin:    General: Skin is warm and dry.  Neurological:     General: No focal deficit present.     Mental Status: She is alert. Mental status is at baseline.  Psychiatric:        Mood and Affect: Mood normal.        Behavior: Behavior normal.     ED Results and Treatments Labs (all labs ordered are listed, but only abnormal results are displayed) Labs Reviewed  COMPREHENSIVE METABOLIC PANEL WITH GFR - Abnormal; Notable for the following components:      Result Value   Glucose, Bld 204 (*)    Creatinine, Ser 1.01 (*)    Calcium  8.3 (*)    Total Protein 6.4 (*)    Albumin 3.1 (*)    Total Bilirubin 1.6 (*)     GFR, Estimated 53 (*)    All other components within normal limits  CBC - Abnormal; Notable for the following components:   RBC 3.47 (*)    Hemoglobin 11.1 (*)    HCT 33.9 (*)    Platelets 130 (*)    All other components within normal limits  PROTIME-INR - Abnormal; Notable for the following components:   Prothrombin Time 21.7 (*)    INR 1.8 (*)    All other components within normal limits                                                                                                                          Radiology CT ABDOMEN PELVIS WO CONTRAST Result Date: 10/27/2024 CLINICAL DATA:  Abdominal pain. EXAM: CT ABDOMEN AND PELVIS WITHOUT CONTRAST TECHNIQUE: Multidetector  CT imaging of the abdomen and pelvis was performed following the standard protocol without IV contrast. RADIATION DOSE REDUCTION: This exam was performed according to the departmental dose-optimization program which includes automated exposure control, adjustment of the mA and/or kV according to patient size and/or use of iterative reconstruction technique. COMPARISON:  11/28/2021. FINDINGS: Lower chest: No acute abnormality. Hepatobiliary: Normal liver. Minimal dependent density in the gallbladder, possible small stone versus minimal sludge. No acute cholecystitis. No bile duct dilation. Pancreas: Unremarkable. No pancreatic ductal dilatation or surrounding inflammatory changes. Spleen: Normal in size without focal abnormality. Adrenals/Urinary Tract: No adrenal mass. Bilateral renal cortical thinning. Bilateral nonobstructing intrarenal stones. Small cyst, medial upper pole of the left kidney. No hydronephrosis. Normal ureters. Normal bladder. Stomach/Bowel: Rectum moderately distended with stool. Mild to moderate increase the colonic stool burden. No bowel dilation. No wall thickening. Stomach is unremarkable. Vascular/Lymphatic: Aortoiliac atherosclerosis. No aneurysm. No enlarged lymph nodes. Reproductive: Status post  hysterectomy. No adnexal masses. Other: Small midline hernia containing fat only, stable from the prior CT. No ascites. Musculoskeletal: No fracture or acute finding. No bone lesion. Right hip arthroplasty appears well seated and aligned. Advanced left hip arthropathic changes and significant degenerative changes of the visualized spine as well as a marked dextroscoliosis. IMPRESSION: 1. No acute findings. 2. Increased colonic stool burden and moderate distention of the rectum with stool. No bowel obstruction or inflammation. 3. Bilateral nonobstructing intrarenal stones. 4. Aortic atherosclerosis. Electronically Signed   By: Alm Parkins M.D.   On: 10/27/2024 13:35    Pertinent labs & imaging results that were available during my care of the patient were reviewed by me and considered in my medical decision making (see MDM for details).  Medications Ordered in ED Medications  HYDROmorphone  (DILAUDID ) injection 0.5 mg (0.5 mg Intravenous Given 10/27/24 1230)  HYDROmorphone  (DILAUDID ) injection 0.5 mg (0.5 mg Intravenous Given 10/27/24 1511)  sorbitol, magnesium  hydroxide, mineral oil, glycerin (SMOG) enema (960 mLs Rectal Given 10/27/24 1644)                                                                                                                                     Procedures .Fecal disimpaction  Date/Time: 10/27/2024 5:32 PM  Performed by: Francesca Elsie CROME, MD Authorized by: Francesca Elsie CROME, MD  Consent: Verbal consent obtained Risks and benefits: risks, benefits and alternatives were discussed Consent given by: patient Patient identity confirmed: verbally with patient and hospital-assigned identification number Local anesthesia used: no  Anesthesia: Local anesthesia used: no  Sedation: Patient sedated: no  Patient tolerance: patient tolerated the procedure well with no immediate complications Comments: Large stool evacuated from rectum      (including critical care  time)  Medical Decision Making / ED Course   MDM:  89 year old presenting to the emergency department with rectal bleeding after giving herself an enema.  Also having rectal pain.  Will obtain further testing, given abdominal tenderness  will obtain CT abdomen.  Differential includes perforation, fecal impaction, diverticulitis, constipation, rectal injury, hemorrhoids.  Will reassess  Clinical Course as of 10/27/24 1734  Sat Oct 27, 2024  1524 CT scan without evidence of acute process, does show distention of rectum, on my interpretation shows impacted stool.  Disimpaction was performed with copious amounts of stool evacuated from the rectum.  Will try enema after this.  There are some tiny streaks of blood in her rectal stool but given that this began after patient had enema, suspect this most likely mechanical irritation rather than any true GI bleed [WS]  1731 Patient was able to pass large additional stool after enema and is feeling better.  Reports her rectal pain is significantly improved.  Family reports that she does occasionally use Imodium.  Recommended that she avoid this at her age and increase fiber, stay hydrated. Will discharge patient to home. All questions answered. Patient comfortable with plan of discharge. Return precautions discussed with patient and specified on the after visit summary.  [WS]    Clinical Course User Index [WS] Francesca Elsie CROME, MD     Additional history obtained: -Additional history obtained from family and ems -External records from outside source obtained and reviewed including: Chart review including previous notes, labs, imaging, consultation notes including prior notes    Lab Tests: -I ordered, reviewed, and interpreted labs.   The pertinent results include:   Labs Reviewed  COMPREHENSIVE METABOLIC PANEL WITH GFR - Abnormal; Notable for the following components:      Result Value   Glucose, Bld 204 (*)    Creatinine, Ser 1.01 (*)     Calcium  8.3 (*)    Total Protein 6.4 (*)    Albumin 3.1 (*)    Total Bilirubin 1.6 (*)    GFR, Estimated 53 (*)    All other components within normal limits  CBC - Abnormal; Notable for the following components:   RBC 3.47 (*)    Hemoglobin 11.1 (*)    HCT 33.9 (*)    Platelets 130 (*)    All other components within normal limits  PROTIME-INR - Abnormal; Notable for the following components:   Prothrombin Time 21.7 (*)    INR 1.8 (*)    All other components within normal limits    Notable for mild chronic anemia    Imaging Studies ordered: I ordered imaging studies including CT abdomen On my interpretation imaging demonstrates fecal impaction I independently visualized and interpreted imaging. I agree with the radiologist interpretation   Medicines ordered and prescription drug management: Meds ordered this encounter  Medications   HYDROmorphone  (DILAUDID ) injection 0.5 mg   HYDROmorphone  (DILAUDID ) injection 0.5 mg   sorbitol, magnesium  hydroxide, mineral oil, glycerin (SMOG) enema   polyethylene glycol powder (GLYCOLAX /MIRALAX ) 17 GM/SCOOP powder    Sig: Take 17 g by mouth daily. Dissolve 1 capful (17g) in 4-8 ounces of liquid and take by mouth daily.    Dispense:  238 g    Refill:  0    -I have reviewed the patients home medicines and have made adjustments as needed   Social Determinants of Health:  Diagnosis or treatment significantly limited by social determinants of health: obesity   Reevaluation: After the interventions noted above, I reevaluated the patient and found that their symptoms have improved  Co morbidities that complicate the patient evaluation  Past Medical History:  Diagnosis Date   Aortic calcification 2013   Atherosclerotic calcifications of the abdominal aorta and branch  noted on CT Abd/Pelvis   Arthritis    legs, lower back, hands   Cancer (HCC)    endometrial   Diabetes mellitus    Ectopic pregnancy    Eczema    Headache(784.0)     hx of   History of kidney stones    Hyperlipidemia    no meds   Hypertension    Hypothyroidism    Persistent atrial fibrillation (HCC) 09/28/2016   Relatively new Dx: Asymptomatic.  Rate controlled without medication.   SVD (spontaneous vaginal delivery)    x 3   Thyroid  disease       Dispostion: Disposition decision including need for hospitalization was considered, and patient discharged from emergency department.    Final Clinical Impression(s) / ED Diagnoses Final diagnoses:  Fecal impaction in rectum [facility])     This chart was dictated using voice recognition software.  Despite best efforts to proofread,  errors can occur which can change the documentation meaning.    Francesca Elsie CROME, MD 10/27/24 [phone]

## 2024-10-27 NOTE — Discharge Instructions (Addendum)
 We evaluated you for your rectal pain.  Your testing showed a fecal impaction (severe constipation in the rectum).  We were able to remove stool from your rectum and give you an enema.  We would like you to start taking MiraLAX  daily.  We have prescribed this for you.  Please make sure that you are staying adequately hydrated and eating lots of fiber in your diet.  I would avoid Imodium at your age as it can easily lead to severe constipation.  Please follow-up closely with your primary doctor and return if any symptoms worsen.

## 2024-10-27 NOTE — ED Triage Notes (Addendum)
 Pt BIB [facility] from home. C/o rectal pain and bleeding since last night. Attempted to give self enema last night. Initially 10/10 pain. 20 rt wrist. Given 100 mcg fentanyl  and zofran . Pt on xarelto . Approx 200 NS given.  108/36 94/48 on arrival 108/36 after fluids

## 2024-12-03 DIAGNOSIS — I4821 Permanent atrial fibrillation: Secondary | ICD-10-CM

## 2024-12-03 DIAGNOSIS — E1169 Type 2 diabetes mellitus with other specified complication: Secondary | ICD-10-CM

## 2024-12-03 DIAGNOSIS — I152 Hypertension secondary to endocrine disorders: Secondary | ICD-10-CM

## 2024-12-15 ENCOUNTER — Other Ambulatory Visit: Payer: Self-pay | Admitting: Nurse Practitioner

## 2024-12-15 DIAGNOSIS — R600 Localized edema: Secondary | ICD-10-CM

## 2024-12-15 DIAGNOSIS — L03115 Cellulitis of right lower limb: Secondary | ICD-10-CM

## 2025-01-08 ENCOUNTER — Ambulatory Visit: Payer: Self-pay | Admitting: Nurse Practitioner

## 2025-01-08 ENCOUNTER — Ambulatory Visit

## 2025-02-13 ENCOUNTER — Ambulatory Visit: Admitting: Cardiology
